# Patient Record
Sex: Female | Born: 1970 | Race: Black or African American | Hispanic: No | Marital: Married | Smoking: Never smoker
Health system: Southern US, Community
[De-identification: ages and names within clinical notes are randomized; demographics above are authoritative.]

## PROBLEM LIST (undated history)

## (undated) DIAGNOSIS — J069 Acute upper respiratory infection, unspecified: Secondary | ICD-10-CM

## (undated) DIAGNOSIS — I10 Essential (primary) hypertension: Secondary | ICD-10-CM

## (undated) DIAGNOSIS — G5600 Carpal tunnel syndrome, unspecified upper limb: Secondary | ICD-10-CM

## (undated) DIAGNOSIS — I719 Aortic aneurysm of unspecified site, without rupture: Secondary | ICD-10-CM

## (undated) DIAGNOSIS — F329 Major depressive disorder, single episode, unspecified: Secondary | ICD-10-CM

## (undated) DIAGNOSIS — J45909 Unspecified asthma, uncomplicated: Secondary | ICD-10-CM

## (undated) DIAGNOSIS — F32A Depression, unspecified: Secondary | ICD-10-CM

## (undated) DIAGNOSIS — K317 Polyp of stomach and duodenum: Secondary | ICD-10-CM

## (undated) DIAGNOSIS — K219 Gastro-esophageal reflux disease without esophagitis: Secondary | ICD-10-CM

## (undated) DIAGNOSIS — F419 Anxiety disorder, unspecified: Secondary | ICD-10-CM

## (undated) DIAGNOSIS — T7840XA Allergy, unspecified, initial encounter: Secondary | ICD-10-CM

## (undated) DIAGNOSIS — K449 Diaphragmatic hernia without obstruction or gangrene: Secondary | ICD-10-CM

## (undated) DIAGNOSIS — I517 Cardiomegaly: Secondary | ICD-10-CM

## (undated) DIAGNOSIS — M1990 Unspecified osteoarthritis, unspecified site: Secondary | ICD-10-CM

## (undated) HISTORY — DX: Gastro-esophageal reflux disease without esophagitis: K21.9

## (undated) HISTORY — DX: Diaphragmatic hernia without obstruction or gangrene: K44.9

## (undated) HISTORY — PX: NASAL SINUS SURGERY: SHX719

## (undated) HISTORY — PX: WISDOM TOOTH EXTRACTION: SHX21

## (undated) HISTORY — DX: Allergy, unspecified, initial encounter: T78.40XA

## (undated) HISTORY — PX: CARPAL TUNNEL RELEASE: SHX101

## (undated) HISTORY — DX: Anxiety disorder, unspecified: F41.9

## (undated) HISTORY — DX: Aortic aneurysm of unspecified site, without rupture: I71.9

## (undated) HISTORY — DX: Acute upper respiratory infection, unspecified: J06.9

## (undated) HISTORY — DX: Unspecified asthma, uncomplicated: J45.909

## (undated) HISTORY — PX: COLONOSCOPY: SHX5424

## (undated) HISTORY — DX: Polyp of stomach and duodenum: K31.7

## (undated) HISTORY — DX: Essential (primary) hypertension: I10

## (undated) HISTORY — PX: OTHER SURGICAL HISTORY: SHX169

---

## 2000-10-07 ENCOUNTER — Ambulatory Visit (HOSPITAL_COMMUNITY): Admission: RE | Admit: 2000-10-07 | Discharge: 2000-10-07 | Payer: Self-pay | Admitting: Neurosurgery

## 2000-10-07 ENCOUNTER — Encounter: Payer: Self-pay | Admitting: Family Medicine

## 2004-05-21 ENCOUNTER — Inpatient Hospital Stay (HOSPITAL_COMMUNITY): Admission: AD | Admit: 2004-05-21 | Discharge: 2004-05-21 | Payer: Self-pay | Admitting: Gynecology

## 2004-06-29 ENCOUNTER — Ambulatory Visit (HOSPITAL_COMMUNITY): Admission: RE | Admit: 2004-06-29 | Discharge: 2004-06-29 | Payer: Self-pay | Admitting: Gynecology

## 2005-05-18 ENCOUNTER — Ambulatory Visit: Payer: Self-pay | Admitting: Nurse Practitioner

## 2005-08-04 ENCOUNTER — Ambulatory Visit: Payer: Self-pay | Admitting: Nurse Practitioner

## 2006-07-26 ENCOUNTER — Other Ambulatory Visit: Admission: RE | Admit: 2006-07-26 | Discharge: 2006-07-26 | Payer: Self-pay | Admitting: Obstetrics & Gynecology

## 2007-10-19 HISTORY — PX: CARPAL TUNNEL RELEASE: SHX101

## 2009-10-08 ENCOUNTER — Ambulatory Visit: Payer: Self-pay | Admitting: Gynecology

## 2009-10-08 ENCOUNTER — Other Ambulatory Visit: Admission: RE | Admit: 2009-10-08 | Discharge: 2009-10-08 | Payer: Self-pay | Admitting: Gynecology

## 2009-10-14 ENCOUNTER — Ambulatory Visit: Payer: Self-pay | Admitting: Gynecology

## 2010-01-01 ENCOUNTER — Ambulatory Visit: Payer: Self-pay | Admitting: Gynecology

## 2010-03-25 ENCOUNTER — Ambulatory Visit: Payer: Self-pay | Admitting: Gynecology

## 2010-06-10 ENCOUNTER — Ambulatory Visit: Payer: Self-pay | Admitting: Gynecology

## 2010-07-01 ENCOUNTER — Ambulatory Visit: Payer: Self-pay | Admitting: Gynecology

## 2010-09-03 ENCOUNTER — Ambulatory Visit: Payer: Self-pay | Admitting: Gynecology

## 2010-10-13 ENCOUNTER — Ambulatory Visit: Payer: Self-pay | Admitting: Gynecology

## 2010-10-16 ENCOUNTER — Other Ambulatory Visit
Admission: RE | Admit: 2010-10-16 | Discharge: 2010-10-16 | Payer: Self-pay | Source: Home / Self Care | Admitting: Gynecology

## 2010-11-08 ENCOUNTER — Encounter: Payer: Self-pay | Admitting: Obstetrics & Gynecology

## 2010-11-27 ENCOUNTER — Ambulatory Visit: Payer: BC Managed Care – PPO

## 2010-11-27 DIAGNOSIS — Z3049 Encounter for surveillance of other contraceptives: Secondary | ICD-10-CM

## 2011-02-26 ENCOUNTER — Ambulatory Visit (INDEPENDENT_AMBULATORY_CARE_PROVIDER_SITE_OTHER): Payer: BC Managed Care – PPO

## 2011-02-26 DIAGNOSIS — Z3049 Encounter for surveillance of other contraceptives: Secondary | ICD-10-CM

## 2011-05-07 ENCOUNTER — Other Ambulatory Visit: Payer: Self-pay | Admitting: Gynecology

## 2011-05-07 ENCOUNTER — Other Ambulatory Visit (HOSPITAL_COMMUNITY)
Admission: RE | Admit: 2011-05-07 | Discharge: 2011-05-07 | Disposition: A | Discharge status: Home / Self Care | Payer: BC Managed Care – PPO | Source: Ambulatory Visit | Attending: Gynecology | Admitting: Gynecology

## 2011-05-07 ENCOUNTER — Ambulatory Visit (INDEPENDENT_AMBULATORY_CARE_PROVIDER_SITE_OTHER): Payer: BC Managed Care – PPO | Admitting: Gynecology

## 2011-05-07 DIAGNOSIS — R87619 Unspecified abnormal cytological findings in specimens from cervix uteri: Secondary | ICD-10-CM

## 2011-05-19 ENCOUNTER — Telehealth: Payer: Self-pay | Admitting: *Deleted

## 2011-05-19 NOTE — Telephone Encounter (Signed)
PT CALLED TO INFORM OFFICE SHE IS SWITCHING TO [facility].

## 2011-05-21 ENCOUNTER — Ambulatory Visit (INDEPENDENT_AMBULATORY_CARE_PROVIDER_SITE_OTHER): Payer: BC Managed Care – PPO | Admitting: Anesthesiology

## 2011-05-21 DIAGNOSIS — Z309 Encounter for contraceptive management, unspecified: Secondary | ICD-10-CM

## 2011-05-21 DIAGNOSIS — IMO0001 Reserved for inherently not codable concepts without codable children: Secondary | ICD-10-CM

## 2011-05-21 MED ORDER — MEDROXYPROGESTERONE ACETATE 150 MG/ML IM SUSP
150.0000 mg | Freq: Once | INTRAMUSCULAR | Status: AC
  Administered 2011-05-21: 150 mg via INTRAMUSCULAR

## 2011-08-20 ENCOUNTER — Ambulatory Visit (INDEPENDENT_AMBULATORY_CARE_PROVIDER_SITE_OTHER): Payer: BC Managed Care – PPO | Admitting: Anesthesiology

## 2011-08-20 DIAGNOSIS — Z309 Encounter for contraceptive management, unspecified: Secondary | ICD-10-CM

## 2011-08-20 MED ORDER — MEDROXYPROGESTERONE ACETATE 150 MG/ML IM SUSP
150.0000 mg | Freq: Once | INTRAMUSCULAR | Status: AC
  Administered 2011-08-20: 150 mg via INTRAMUSCULAR

## 2011-10-26 ENCOUNTER — Other Ambulatory Visit: Payer: Self-pay | Admitting: Gynecology

## 2011-10-27 ENCOUNTER — Ambulatory Visit (INDEPENDENT_AMBULATORY_CARE_PROVIDER_SITE_OTHER): Payer: BC Managed Care – PPO | Admitting: Gynecology

## 2011-10-27 ENCOUNTER — Other Ambulatory Visit (HOSPITAL_COMMUNITY)
Admission: RE | Admit: 2011-10-27 | Discharge: 2011-10-27 | Disposition: A | Discharge status: Home / Self Care | Payer: BC Managed Care – PPO | Source: Ambulatory Visit | Attending: Gynecology | Admitting: Gynecology

## 2011-10-27 ENCOUNTER — Encounter: Payer: Self-pay | Admitting: Gynecology

## 2011-10-27 VITALS — BP 126/80 | Ht 62.5 in | Wt 175.0 lb

## 2011-10-27 DIAGNOSIS — Z01419 Encounter for gynecological examination (general) (routine) without abnormal findings: Secondary | ICD-10-CM | POA: Insufficient documentation

## 2011-10-27 DIAGNOSIS — I10 Essential (primary) hypertension: Secondary | ICD-10-CM

## 2011-10-27 DIAGNOSIS — Z800 Family history of malignant neoplasm of digestive organs: Secondary | ICD-10-CM

## 2011-10-27 DIAGNOSIS — R635 Abnormal weight gain: Secondary | ICD-10-CM

## 2011-10-27 LAB — URINALYSIS, ROUTINE W REFLEX MICROSCOPIC
Bilirubin Urine: NEGATIVE
Glucose, UA: NEGATIVE mg/dL
Ketones, ur: NEGATIVE mg/dL
Protein, ur: NEGATIVE mg/dL
Urobilinogen, UA: 0.2 mg/dL (ref 0.0–1.0)

## 2011-10-27 NOTE — Progress Notes (Signed)
Megan Lang [birth year] [id number]   History:    41 y.o.  for annual exam who states that she has been followed by her primary care physician at the [facility] urgent care facility by Dr. Yong Lang for her hypertension. She has not had any blood work in over year. She was seen several years ago by cardiologist at [facility] and cardiology for nonspecific chest discomfort with negative findings patient had the symptoms maybe once or twice a year very sporadic and very short lived. She was otherwise doing well today. With no complaints. She does her self breast examinations on regular basis. She is using Depo-Provera for contraception every 3 months. Her flu shot she believes was given at the urgent care facility recently. Review of her record again she was weighing 171 up to 175 now with a BMI of 31.  Past medical history,surgical history, family history and social history were all reviewed and documented in the EPIC chart.  Gynecologic History No LMP recorded. Patient has had an injection. Contraception: Depo-Provera injections Last Pap: 2012. Results were: normal Last mammogram: No prior study. Res no prior study/abn:[id number]}  Obstetric History OB History    Grav Para Term Preterm Abortions TAB SAB Ect Mult Living   3 3 3       3      # Outc Date GA Lbr Len/2nd Wgt Sex Del Anes PTL Lv   1 TRM     F SVD  No Yes   2 TRM     F SVD  No Yes   3 TRM     F SVD  No Yes       ROS:  Was performed and pertinent positives and negatives are included in the history.  Exam: chaperone present  BP 126/80  Ht 5' 2.5" (1.588 m)  Wt 175 lb (79.379 kg)  BMI 31.50 kg/m2  Body mass index is 31.50 kg/(m^2).  General appearance : Well developed well nourished female. No acute distress HEENT: Neck supple, trachea midline, no carotid bruits, no thyroidmegaly Lungs: Clear to auscultation, no rhonchi or wheezes, or rib retractions  Heart: Regular rate and rhythm, no murmurs or gallops Breast:Examined in sitting  and supine position were symmetrical in appearance, no palpable masses or tenderness,  no skin retraction, no nipple inversion, no nipple discharge, no skin discoloration, no axillary or supraclavicular lymphadenopathy Abdomen: no palpable masses or tenderness, no rebound or guarding Extremities: no edema or skin discoloration or tenderness  Pelvic:  Bartholin, Urethra, Skene Glands: Within normal limits             Vagina: No gross lesions or discharge  Cervix: No gross lesions or discharge  Uterus  anteverted, normal size, shape and consistency, non-tender and mobile  Adnexa  Without masses or tenderness  Anus and perineum  normal   Rectovaginal  normal sphincter tone without palpated masses or tenderness             Hemoccult not done     Assessment/Plan:  41 y.o. female for annual exam with no abnormal findings. Patient has informed me that her father was diagnosed with colon cancer at the age of 50. Patient will be referred to the gastroenterologist for early screening colonoscopy. She will return back in the first week in February which is due for her next Depo-Provera injection. She is instructed to do her self breast examinations. If she has any recurrence of her chest pain ( which are very sporadic in between) she should return back  to the cardiologist for further evaluation. Because of her weight gain a TSH, comprehensive metabolic panel (history of hypertension) CBC urinalysis and Pap smear. A requisition will be provided her to schedule her mammogram and encouraged her monthly self breast examination. Literature formation weight reduction exercise and diet was provided as well for vaccine up-to-date.    Megan Edwards MD, 9:47 AM 10/27/2011

## 2011-10-27 NOTE — Patient Instructions (Signed)
Exercise to Lose Weight Exercise and a healthy diet may help you lose weight. Your doctor may suggest specific exercises. EXERCISE IDEAS AND TIPS  Choose low-cost things you enjoy doing, such as walking, bicycling, or exercising to workout videos.   Take stairs instead of the elevator.   Walk during your lunch break.   Park your car further away from work or school.   Go to a gym or an exercise class.   Start with 5 to 10 minutes of exercise each day. Build up to 30 minutes of exercise 4 to 6 days a week.   Wear shoes with good support and comfortable clothes.   Stretch before and after working out.   Work out until you breathe harder and your heart beats faster.   Drink extra water when you exercise.   Do not do so much that you hurt yourself, feel dizzy, or get very short of breath.  Exercises that burn about 150 calories:  Running 1  miles in 15 minutes.   Playing volleyball for 45 to 60 minutes.   Washing and waxing a car for 45 to 60 minutes.   Playing touch football for 45 minutes.   Walking 1  miles in 35 minutes.   Pushing a stroller 1  miles in 30 minutes.   Playing basketball for 30 minutes.   Raking leaves for 30 minutes.   Bicycling 5 miles in 30 minutes.   Walking 2 miles in 30 minutes.   Dancing for 30 minutes.   Shoveling snow for 15 minutes.   Swimming laps for 20 minutes.   Walking up stairs for 15 minutes.   Bicycling 4 miles in 15 minutes.   Gardening for 30 to 45 minutes.   Jumping rope for 15 minutes.   Washing windows or floors for 45 to 60 minutes.  Document Released: 11/06/2010 Document Revised: 06/16/2011 Document Reviewed: 11/06/2010 [employer] Patient Information 2012 [employer].                                           Dietary therapy for weight gain   INTRODUCTION - The optimal management of overweight and obesity requires a combination of diet, exercise, and behavioral modification. In addition, some patients  eventually require pharmacologic therapy or bariatric surgery. The risk of overweight to the subject should be evaluated before beginning any treatment program. Selection of treatment can then be made using a risk-benefit assessment). The choice of therapy is dependent on several factors including the degree of overweight or obesity and patient preference.  This topic will review the dietary therapy of obesity. Other aspects of treatment are discussed separately. (See "Health hazards associated with obesity in adults" and "Overview of therapy for obesity in adults" and "Drug therapy of obesity" and "Behavioral strategies in the treatment of obesity".) GOALS OF WEIGHT LOSS - It is important to set goals when discussing a dietary weight loss program with an individual patient. An initial weight loss goal of 5 to 7 percent of body weight is realistic for most individuals. The first goal for any overweight individual is to prevent further weight gain and keep body weight stable (within 5 pounds of its current level).  The goal of the clinician is to identify and review with the patient a realistic weight-loss goal. Most patients have a weight loss goal of 30 percent or more below current weight, which   is unrealistic [1].  A successful program will lead to a weight loss of more than 5 percent of initial weight [2]. A weight loss of more than 5 percent can reduce risk factors for cardiovascular disease, such as dyslipidemia, hypertension, and diabetes mellitus [3]. In the Diabetes Prevention Program, a multi-center trial in patients with impaired glucose tolerance, weight loss of 7 percent reduced the rate of progression from impaired glucose tolerance to diabetes by 58 percent [4]. (See "Prediction and prevention of type 2 diabetes mellitus", section on 'Diabetes Prevention Program'.)  Loss of 5 percent of initial body weight and maintenance of this loss is a good medical result, even if the subject does not reach  his or her "dream" weight.  Although an extremely difficult goal to achieve, a body mass index (BMI) between 20 and 25 kg/m2 puts the subject in the lowest risk category (table 1 and figure 1). DIETARY ENERGY Rate of weight loss - The rate of weight loss is directly related to the difference between the subject's energy intake and energy requirements. Reducing caloric intake below expenditure results in a predictable initial rate of weight loss that is related to the energy deficit [5,6]. However, prediction of weight loss for an individual subject can be difficult because of marked intersubject variability in initial body composition, adherence, and energy expenditure [5,7]. Food records are often inaccurate. Most normal-weight people under-report what they eat by 10 to 30 percent, while overweight people under-report by 30 percent or more [8]. In addition, energy requirements are influenced by fidgeting, gender, age, and genetic factors [5,6,9]. As examples: Men lose more weight than women of similar height and weight when they comply with eating any given diet because men have more lean body mass, less percent body fat, and therefore higher energy expenditure.  Older subjects of either sex have a lower energy expenditure and therefore lose weight more slowly than younger subjects; metabolic rate declines by approximately 2 percent per decade (about 100 kcal/decade) [10].  The importance of genetic factors is illustrated by a study of identical female twin pairs who were overfed to induce weight gain [11]. Twelve twin pairs were overfed by 1000 kcal/day for 84 of 100 days. The degree of weight gain at a constant dietary caloric increment varied widely among the twin pairs (from 4.3 to 13.3 kg), in fact, there was three times the variance for both weight and fat mass among the twin pairs when compared with that within the twin pairs. Approximately 22 kcal/kg is required to maintain a kilogram of body weight in  a normal adult. Thus, the expected or calculated energy expenditure for a woman weighing 100 kg is approximately 2200 kcal/day. The variability of 20 percent could give energy needs as high as 2620 kcal/day or as low as 1860 kcal/day. An average deficit of 500 kcal/day should result in an initial weight loss of approximately 0.5 kg/week (1 lb/week). However, after three to six months of weight loss, energy expenditure adaptations occur, which slow the bodyweight response to a given change in energy intake, thereby diminishing ongoing weight loss [7]. There are several methods of formally estimating energy expenditure; we suggest using the WHO criteria (table 2). This method allows a direct estimate of resting metabolic rate (RMR) and calculation of daily energy requirement. The low activity level (1.3 x RMR) includes subjects who lead a sedentary life. The high activity level (1.7 x RMR) applies to those in jobs requiring manual labor or patients with regular daily physical exercise   programs [12]. Maintenance of weight loss - It is important for the overweight subject to understand that achieving and maintaining weight loss is made difficult by the reduction in energy expenditure that is induced by weight loss (figure 2) [13]. Weight loss maintenance is also difficult because of changes in the peripheral hormone signals that regulate appetite. Gastrointestinal peptides, such as ghrelin, which stimulates appetite, and gastric inhibitory polypeptide, which may promote energy storage, increase after diet-induced weight loss. Other circulating mediators that inhibit intake (eg, leptin, peptide YY, cholecystokinin, pancreatic polypeptide) decrease. These hormonal adaptations favoring weight gain persist for at least one year after diet-induced weight loss [14]. (See "Overview of therapy for obesity in adults", section on 'Maintenance of weight loss' and "Pathogenesis of obesity", section on 'Ghrelin'.)  TYPES OF  DIETS - The general consensus is that excess intake of calories from any source, associated with a sedentary lifestyle, causes weight gain and obesity. The goal of dietary therapy, therefore, is to decrease energy intake from food. Conventional diets are defined as those below energy requirements but above 800 kcal/day [15]. These diets fall into four groups: Balanced low-calorie diets/portion-controlled diets  Low-fat diets  Low-carbohydrate diets  Mediterranean diet  Fad diets (diets involving unusual combinations of foods or eating sequences) Commercial weight loss programs and internet-based programs are discussed elsewhere. (See "Behavioral strategies in the treatment of obesity".) Balanced low-calorie diets - Planning a diet requires the selection of a caloric intake and then selection of foods to meet this intake. It is desirable to eat foods with adequate nutrients in addition to protein, carbohydrate, and essential fatty acids. Thus, weight-reducing diets should eliminate alcohol, sugar-containing beverages, and most highly concentrated sweets because they rarely contain adequate amounts of other nutrients besides energy. Breakdown of some protein is to be expected during weight loss. When weight increases as a result of overeating, approximately 75 percent of the extra energy is stored as fat and the remaining 25 percent as lean tissue. If the lean tissue contains 20 percent protein, then 5 percent of the extra weight gain would be protein. Thus, it should be anticipated that during weight loss, at least 5 percent of weight loss will be protein. A desirable feature of any calorie restricted diet, however, is that it results in the lowest possible loss of protein, recognizing that this will not be less than 5 percent of the weight that is lost. Portion-controlled diets - One simple approach to providing a calorie-controlled diet is to use individually packaged foods, such as formula diet drinks  using powdered or liquid formula diets, nutrition bars, frozen food, and pre-packaged meals that can be stored at room temperature as the main source of nutrients. Frozen low-calorie meals containing 250 to 350 kcal/package can be a convenient and nutritious way to do this. We have often recommended the use of formula diets or breakfast bars for breakfast, formula diets or a frozen lunch entree for lunch, and a frozen calorie-controlled entree with additional vegetables for dinner. In this way, it is possible to obtain a calorie-controlled 1000 to 1500 kcal per day diet. In one four-year study this approach resulted in early initial weight loss, which then was maintained [16]. I do not recommend the use of formula diets alone because they do not provide adequate nutritional variety. Low-fat diets - Low-fat diets are another standard strategy to help patients lose weight, and almost all dietary guidelines recommend a reduction in the daily intake of fat to 30 percent of energy intake or less [  17,18]. In a meta-analysis of trials comparing low-fat diets (typically 20 to 25 percent of energy from fats) with a control group consuming a usual diet or a medium fat diet (usually 35 to 40 percent of energy), there was greater weight loss (approximately 3 kg) with low-fat compared with moderate fat diets [19]. In addition, one report noted that people who successfully keep their weight reduced adopt three strategies, one of which is eating a lower fat diet [20]. (See "Dietary fat" and "Etiology and natural history of obesity", section on 'Dietary habits'.) A low-fat dietary pattern with healthy carbohydrates is not associated with weight gain. This was illustrated by the [facility] Dietary Modification Trial of [phone] postmenopausal women over age 50 years who were randomly assigned to a dietary intervention that included group and individual sessions to promote a decrease in fat intake and increases in  fruit, vegetable, and grain consumption (healthy carbohydrates), but did not include weight loss or caloric restriction goals, or a control group which received only dietary educational materials [21]. After an average of 7.5 years of follow-up, the following results were seen: Women in the intervention group lost weight in the first year (mean of 2.2 kg) and maintained lower weight than the control women at 7.5 years (difference of 1.9 kg at one year, and 0.4 kg at 7.5 years).  No tendency toward weight gain was seen in the intervention group overall, or when stratified by age, ethnicity, or body mass index.  Weight loss was related to the level of fat intake and was greatest in women who decreased their percentage of energy from fat the most. A similar, but lesser trend was seen with increased vegetable and fruit intake. A low-fat diet can be implemented in two ways. First, the dietitian can provide the subject with specific menu plans that emphasize the use of reduced fat foods. As one guideline, if a food "melts" in your mouth, it probably has fat in it. Second, subjects can be instructed in counting fat grams as an alternative to counting calories. Fat has 9.4 kcal/g. It is thus very easy to calculate the number of grams of fat a subject can eat for any given level of energy intake. Many experts recommend keeping calories from fat to below 30 percent of total calories. In practical terms, this means eating about 33 g of fat for each 1000 calories in the diet. For simplicity, I use 30 g of fat or less for each 1000 kcal. For a 1500-calorie diet, this would mean about 45 g or less of fat, which can be counted using the nutrition information labels on food packages. Low-carbohydrate diets - Proponents of low-carbohydrate diets have argued that the increasing obesity epidemic may be in part due to low-fat, high-carbohydrate diets. But this may be dependent upon the type of carbohydrates that are eaten, such  as energy dense snacks and sugar or high fructose containing beverages. The carbohydrate content of the diet is an important determinant of short-term (less than two weeks) weight loss. Low (60 to 130 grams of carbohydrates) and very low-carbohydrate diets (0 to <60 grams) have been popular for many years [15]. Restriction of carbohydrates leads to glycogen mobilization and, if carbohydrate intake is less than 50 g/day, ketosis will develop. Rapid weight loss occurs, primarily due to glycogen breakdown and fluid loss rather than fat loss. Low and very low-carbohydrate diets are more effective for short-term weight loss than low-fat diets, although probably not for long-term weight loss. A meta-analysis   of five trials found that the difference in weight loss at six months, favoring the low carbohydrate over low fat diet, was not sustained at 12 months [22]. (See 'Comparison trials' below.) Low-carbohydrate diets may have some other beneficial effects with regard to risk of developing type 2 diabetes mellitus, coronary heart disease, and some cancers, particularly if attention is paid to the type as well as the quantity of carbohydrate. A low-carbohydrate diet can be implemented in two ways, either by reducing the total amount of carbohydrate or by consuming foods with a lower glycemic index or glycemic load (table 3). Glycemic index and load are reviewed separately. (See "Dietary carbohydrates", section on 'Glycemic index'.) If a low-carbohydrate diet is chosen, healthy choices for fat (mono- and polyunsaturated fats) and protein (fish, nuts, legumes, and poultry) should be encouraged because of the association between saturated fat intake and risk of coronary heart disease. During 26 years of follow-up of women in the Nurses' Health Study and 20 years of follow-up of men in the Health Professionals' Follow-up Study, low carbohydrate diets in the highest versus lowest decile for vegetable proteins and fat were  associated with lower all-cause mortality (HR 0.80, 95% CI 0.75-0.85) and cardiovascular mortality (HR 0.77, 95% CI 0.68-0.87) [23]. In contrast, low carbohydrate diets in the highest versus lowest decile for animal protein and fat were associated with higher all-cause (HR 1.23, 95% CI 1.11-1.37) and cardiovascular (HR 1.14, 95% CI 1.01-1.29) mortality. (See "Dietary fat" and "Overview of primary prevention of coronary heart disease and stroke", section on 'Healthy diet'.) High protein diets - Some popular books recommend high protein diets [24]. In one trial, low-fat diets with 12 percent and 25 percent protein content were compared. Weight loss over six months was greater with the higher protein diet (9 versus 5 kg), but the difference was no longer significant at 12 and 24 months [25]. Higher protein diets may improve weight maintenance, as illustrated by the results of a study of 60 subjects randomly assigned to a low fat, high protein versus low-fat, high-carbohydrate diet after completing a four week very low calorie diet [26]. Among the subjects who completed the three-month study (n = 48), the high protein diet group had significantly better weight maintenance (between group difference of 2.3 kg). High dietary protein intake, due to its acid-producing load, increases urinary calcium excretion (with potential risk for bone loss and calcium stone formation) [27]. Urinary calcium excretion does appear to increase when dietary intake of protein increases [27-29], and this could pose a long-term risk for nephrolithiasis. (See "Risk factors for calcium stones in adults", section on 'Dietary risk factors'.) However, two small randomized trials that looked at bone metabolism found evidence that increased dietary protein may decrease bone resorption [28,29]. One of the trials found that increased intestinal absorption of calcium was primarily responsible for the increased urinary excretion of calcium and that the  excreted calcium was not coming from bone [29]. Mediterranean diet - The term Mediterranean diet refers to a dietary pattern that is common in [place] areas of the [place] area. Although there is some variation in Mediterranean diets, there are some common components that include a high level of monounsaturated fat relative to saturated; moderate consumption of alcohol, mainly as wine; a high consumption of vegetables, fruits, legumes, and grains; a moderate consumption of milk and dairy products, mostly in the form of cheese; and a relatively low intake of meat and meat products. A meta-analysis of 12 studies involving eight cohorts found that a   Mediterranean diet was associated with improved health status and reductions in overall mortality, cardiovascular mortality, cancer mortality, and incidence of Parkinson's disease and Alzheimer's disease [30]. (See "Healthy diet in adults", section on 'Mediterranean diet'.) Very low-calorie diets - Diets with energy levels between 200 and 800 kcal/day are called "very low-calorie diets," while those below 200 kcal/day can be termed starvation diets. The basis for these diets was the notion that the lower the calorie intake the more rapid the weight loss, because the energy withdrawn from body fat stores is a function of the energy deficit. Starvation is the ultimate very low-calorie diet and results in the most rapid weight loss. Although once popular, starvation diets are now rarely used for treatment of obesity. Very low-calorie diets have not been shown to be superior to conventional diets for long-term weight loss. In a meta-analysis of six trials comparing very low-calorie diets with conventional low-calorie diets, short-term weight loss was greater with very low-calorie diets (16.1 versus 9.7 versus percent of initial weight), but there was no difference in long-term weight loss (6.3 versus 5.0 percent) [31]. As with all diets, very low-calorie diets  initially result in substantial protein loss that diminishes with time. Other expected effects include reduction in blood pressure and improvement in hyperglycemia in diabetic patients. Subjects adhering to very low-calorie diets usually have a fall in blood pressure, especially during the first week. Antihypertensive drugs, especially calcium channel blockers and diuretics, should usually be discontinued when a very low calorie diet is begun unless moderate to severe hypertension is present.  Most diabetic patients eating very low-calorie diets have marked improvement in hyperglycemia. Blood glucose concentrations fall within the first one to two weeks, and remain lower as long as the diet is continued. Those patients taking less than 50 units of insulin or an oral hypoglycemic drug will usually be able to discontinue therapy [32]. The side effects of very low-calorie diets include hair loss, thinning of the skin, and coldness. These diets are contraindicated for lactating and pregnant women, and in children who require protein for linear growth. As with all diets, there is increased cholesterol mobilization from peripheral fat stores, thus increasing the risk of gallstones. Very low-calorie diets should be reserved for subjects who require rapid weight loss for a specific purpose, such as surgery. The weight regain when the diet is stopped is often rapid, and it is better to take a more sustainable approach than to use a method that cannot be sustained. Comparison trials - The impact of specific dietary composition on weight change remains uncertain. When energy from dietary carbohydrates decreases, energy from fat sources tends to increase. The reverse is also true; when energy from dietary fats decreases, energy from carbohydrate sources tends to increase. The debate has mainly centered on whether low-fat or low-carbohydrate diets can better induce weight loss and sustain it over the long-term. Weight loss  diets - Initial trials evaluating the effect of type of diet (predominantly low-carbohydrate versus low-fat) on weight loss and other outcomes showed that weight loss at six months was approximately 4 kg greater in the very low-carbohydrate group than in the low-fat group [33-35]. Trials lasting for one year, however, did not find a significant difference in weight loss [34,36,37]. A meta-analysis of five trials (including one study not referenced above) found that the difference in weight loss at six months, favoring the low carbohydrate over low fat diet, was not sustained at 12 months [22]. In one study, this convergence was mainly due to   regain of weight in the low-carbohydrate group [34]; in another, the convergence was due to ongoing weight loss in the low-fat group (figure 3) [36]. Some of these initial comparison trials of different dietary regimens had important limitations [22]. These included high dropout rates (21 to 48 percent), suboptimal dietary compliance, and limited long-term follow-up. Subsequent trials are larger, of longer duration (lasting one to two years), and have conflicting results with regard to the impact of macronutrient composition on weight loss [38-41]. In contrast, all trials found that dietary adherence is an important determinant of weight loss, independent of macronutrient composition. The following observations illustrate the range of findings in these trials: In one trial, 322 moderately obese subjects (86 percent men) were randomly assigned to a low-fat (restricted calorie), Mediterranean (moderate-fat, restricted calorie, rich in vegetables, low in red meat), or low-carbohydrate (non-restricted-calorie) diet for two years [38]. Adherence rates were higher than those reported in previous trials (95.4 and 84.6 percent at one and two years, respectively). Weight loss was greater with the Mediterranean and low-carbohydrate diets than the low-fat diet (mean weight loss 4.4,  4.7, and 2.9 kg, respectively).  The most favorable effect on lipids (increased HDL and decreased triglycerides and ratio of total cholesterol to HDL) was seen in the low-carbohydrate group. Among subjects with type 2 diabetes, the greatest improvement in glycemic control occurred with the Mediterranean diet. Among all groups, weight loss was greater for those who completed the two year study than for those who withdrew.  Another randomized trial compared four different diets in 311 overweight and obese premenopausal women: very low-carbohydrate (Atkins); macronutrient balance controlling glycemic load ([facility]); general calorie restriction, low-fat ([facility]); and very low-fat ([place]) [39]. In the intention-to-treat analysis at one year, mean weight loss was greater in the Atkins diet group compared with the other groups (4.7, 1.6, 2.2, and 2.6 kg, respectively). Pairwise comparisons showed a significant difference only for Atkins versus Zone.  The most favorable effect on triglycerides and HDL-C was seen in the Atkins group. Dietary adherence rates (77 to 88 percent) were similar among the groups and better than in previous trials. Within each group, adherence was significantly associated with weight loss [42].  In the largest trial to date, 811 overweight and obese adults were randomly assigned to one of four diets based upon macronutrient content: low or high fat (20 to 40 percent), which provided carbohydrate at 35, 45, 55, or 65 percent, and high or average protein (15 to 25 percent) [40]. After six months, mean weight loss in each group was 6 kg. By two years, mean weight loss was 3 to 4 kg, and weight losses remained similar in all groups. Many participants had trouble attaining target levels of macronutrients. Subjects who attended the greatest number of group sessions (most adherent) lost the most weight. Thus, any diet that is adhered to will produce modest weight loss, but adherence rates are low with  most diets. Although a low-carbohydrate diet may be associated with greater short-term weight loss, superior weight loss in the long-term has not been established. The optimal mix of macronutrients likely depends upon individual factors [43]. A principal determinant of weight loss appears to be the degree of adherence to the diet, irrespective of the particular macronutrient composition [37,39,40,42,44,45]. Thus, we suggest choosing a macronutrient mix based upon patient preferences, which may improve long-term adherence. Behavioral modification to improve dietary compliance with any type of diet may have the greatest impact on long-term weight loss. (See "Behavioral strategies in the treatment   of obesity".) Lipids - The observed effects on blood lipids were similar for trials comparing low fat and very low carbohydrate diets [22,33-36,46]; the low-carbohydrate/high-fat diets caused slight increases in HDL, and greater decreases in fasting triglycerides. At 12 to 24 months, however, the favorable effects on HDL persisted [34,36,37,41], while triglyceride levels were either reduced [34,36] or returned to baseline [37]. In a meta-analysis of trials comparing low-carbohydrate and low-fat diet groups, LDL levels were increased in the low-carbohydrate group [22]. There was no clear benefit of either low-fat or low-carbohydrate diet on cardiovascular risks. Favorable changes in HDL cholesterol and triglycerides should be weighed against potential unfavorable changes in LDL cholesterol.  Side effects - Very low-carbohydrate diets may be associated with more frequent side effects than low-fat diets. In one of the trials noted above, a number of symptoms occurred significantly more frequently in the low-carbohydrate compared to the low-fat diet group [33]. These included constipation (68 versus 35 percent), headache (60 versus 40 percent), halitosis (38 versus 8 percent), muscle cramps (35 versus 7 percent), diarrhea (23  versus 7 percent), general weakness (25 versus 8 percent), and rash (13 versus 0 percent) [33]. Despite the higher rate of symptoms, dropout rates in clinical trials have been similar for low-carbohydrate and low-fat diets [34-36]. Some have raised the concern about ketosis that occurs with very low-carbohydrate diets. There is one case report of an obese patient who presented in severe ketoacidosis, having lost 9 kg in one month on the Atkins diet, with intake restricted to meat, cheese, and salads [47]. Aside from her diet and possible mild dehydration due to gastroenteritis, no other cause for her ketoacidosis was identified. Weight maintenance diets - Although many individuals have success losing weight with diet, most subsequently regain much or all of the lost weight. Maintaining weight loss is made difficult by the reduction in energy expenditure that is induced by weight loss. In addition, long-term adherence to restrictive diets is difficult. Exercise and behavioral interventions may help individuals maintain weight loss. These strategies are reviewed in detail elsewhere. (See "Role of physical activity and exercise in obesity", section on 'Maintenance of weight loss' and "Behavioral strategies in the treatment of obesity", section on 'Maintenance of weight loss'.) There is little consensus on the optimal mix of macronutrients to maintain weight loss. The satiating effects of high protein, low glycemic index diets have generated interest in manipulating protein composition and glycemic index in weight maintenance diets. (See 'High protein diets' above and "Dietary carbohydrates", section on 'Effect of glycemic index/glycemic load'.) In a multicenter trial of five ad libitum diets to prevent weight regain over 26 weeks, 773 adults who had successfully lost 8 percent of their body weight on a low calorie diet (800 to 1000 kcal/day), were randomly assigned in a two-by-two factorial design to a high or  low-protein (25 versus 13 percent of total calories), high or low-glycemic index, or to a control diet (moderate protein content) [48]. All diets had a moderate fat content (25 to 30 percent). The achieved protein content was 5 percentage points higher in the high versus low protein groups, and the mean glycemic index was five units lower in the low-glycemic versus high-glycemic index groups. In the intention-to-treat analysis, weight regain during the trial was modestly but significantly greater in the low versus high-protein groups (mean difference 0.93 kg) and in the high versus low-glycemic index groups (mean difference 0.95 kg). Only subjects in the high-protein, low-glycemic index diet group continued to lose weight (mean change -0.38 kg).   The trial was limited by the moderate dropout rate (29 percent) and short-term follow-up (six months). Whether a low glycemic index, high protein diet is associated with long-term weight maintenance is unknown. As discussed above, long-term adherence to a weight maintaining diet is probably the most important determinant of success, and therefore the optimal weight maintaining diet will depend upon preference and individual factors. Role of dietary counseling - Dietary counseling may produce modest, short-term weight losses. This topic is reviewed in detail elsewhere. (See "Behavioral strategies in the treatment of obesity", section on 'Elements of behavioral strategies' and "Behavioral strategies in the treatment of obesity", section on 'Efficacy'.)  Prolonged caloric restriction and longevity - Prolonged caloric restriction improves longevity in rodents and non-human primates [49], but it is not known if the same is true in humans. It is hypothesized that the antiaging effects of caloric restriction are due to reduced energy expenditure resulting in a reduction in production of reactive oxygen species (and therefore a reduction in oxidative damage). In addition, other  metabolic effects associated with caloric restriction, such as improved insulin sensitivity, might also have an antiaging effect. In one trial of 48 sedentary, overweight men and women, six months of caloric restriction, with or without exercise, resulted in significant weight loss as expected [50]. In addition, calorie restriction-mediated reductions in fasting insulin concentrations, core body temperature, serum T3 levels, and oxidative damage to DNA (as reflected by a reduction in DNA fragmentation) were seen, suggesting a possible antiaging effect of the prolonged caloric restriction. INFORMATION FOR PATIENTS - UpToDate offers two types of patient education materials, "The Basics" and "Beyond the Basics." The Basics patient education pieces are written in plain language, at the 5th to 6th grade reading level, and they answer the four or five key questions a patient might have about a given condition. These articles are best for patients who want a general overview and who prefer short, easy-to-read materials. Beyond the Basics patient education pieces are longer, more sophisticated, and more detailed. These articles are written at the 10th to 12th grade reading level and are best for patients who want in-depth information and are comfortable with some medical jargon. Here are the patient education articles that are relevant to this topic. We encourage you to print or e-mail these topics to your patients. (You can also locate patient education articles on a variety of subjects by searching on "patient info" and the keyword(s) of interest.)  Basics topics (see "Patient information: Diet and health (The Basics)" and "Patient information: Weight loss treatments (The Basics)")  Beyond the Basics topics (see "Patient information: Diet and health (Beyond the Basics)" and "Patient information: Weight loss treatments (Beyond the Basics)" and "Patient information: Weight loss surgery (Beyond the Basics)")  SUMMARY  AND RECOMMENDATIONS An initial weight loss goal of 5 to 7 percent of body weight is realistic for most individuals. (See 'Goals of weight loss' above.)  Many types of diets produce modest weight loss. Options include balanced low-calorie, low-fat low-calorie, moderate-fat low calorie, low-carbohydrate diets, and the Mediterranean diet. Dietary adherence is an important predictor of weight loss, irrespective of the type of diet. (See 'Types of diets' above.)  We suggest tailoring a diet that reduces energy intake below energy expenditure to individual patient preferences, rather than focusing on the macronutrient composition of the diet (Grade 2B). (See 'Comparison trials' above.)  If a low-carbohydrate diet is chosen, healthy choices for fat (mono and polyunsaturated) and protein (fish, nuts, legumes, and poultry) should be encouraged. If a   low-fat diet is chosen, the decrease in fat should be accompanied by increases in healthy carbohydrates (fruits, vegetables, whole grains).   

## 2011-10-28 LAB — CBC WITH DIFFERENTIAL/PLATELET
Eosinophils Absolute: 0.2 10*3/uL (ref 0.0–0.7)
Eosinophils Relative: 4 % (ref 0–5)
HCT: 41.4 % (ref 36.0–46.0)
Hemoglobin: 13.5 g/dL (ref 12.0–15.0)
Lymphocytes Relative: 34 % (ref 12–46)
Lymphs Abs: 1.7 10*3/uL (ref 0.7–4.0)
MCH: 29.9 pg (ref 26.0–34.0)
MCV: 91.6 fL (ref 78.0–100.0)
Monocytes Relative: 9 % (ref 3–12)
Platelets: 321 10*3/uL (ref 150–400)
RBC: 4.52 MIL/uL (ref 3.87–5.11)
WBC: 4.9 10*3/uL (ref 4.0–10.5)

## 2011-10-28 LAB — COMPREHENSIVE METABOLIC PANEL
ALT: 31 U/L (ref 0–35)
AST: 27 U/L (ref 0–37)
Albumin: 4.5 g/dL (ref 3.5–5.2)
BUN: 8 mg/dL (ref 6–23)
CO2: 23 mEq/L (ref 19–32)
Calcium: 9.8 mg/dL (ref 8.4–10.5)
Chloride: 101 mEq/L (ref 96–112)
Potassium: 3.8 mEq/L (ref 3.5–5.3)

## 2011-10-28 LAB — CHOLESTEROL, TOTAL: Cholesterol: 135 mg/dL (ref 0–200)

## 2011-11-10 ENCOUNTER — Ambulatory Visit (INDEPENDENT_AMBULATORY_CARE_PROVIDER_SITE_OTHER): Payer: BC Managed Care – PPO | Admitting: Anesthesiology

## 2011-11-10 DIAGNOSIS — Z309 Encounter for contraceptive management, unspecified: Secondary | ICD-10-CM

## 2011-11-10 MED ORDER — MEDROXYPROGESTERONE ACETATE 150 MG/ML IM SUSP
150.0000 mg | Freq: Once | INTRAMUSCULAR | Status: AC
  Administered 2011-11-10: 150 mg via INTRAMUSCULAR

## 2011-12-17 ENCOUNTER — Other Ambulatory Visit: Payer: Self-pay | Admitting: Internal Medicine

## 2011-12-18 ENCOUNTER — Other Ambulatory Visit: Payer: Self-pay | Admitting: Physician Assistant

## 2012-01-01 ENCOUNTER — Other Ambulatory Visit: Payer: Self-pay | Admitting: Physician Assistant

## 2012-01-20 ENCOUNTER — Ambulatory Visit: Payer: BC Managed Care – PPO

## 2012-01-21 ENCOUNTER — Ambulatory Visit (INDEPENDENT_AMBULATORY_CARE_PROVIDER_SITE_OTHER): Payer: BC Managed Care – PPO | Admitting: Family Medicine

## 2012-01-21 VITALS — BP 123/83 | HR 78 | Temp 99.1°F | Resp 16 | Ht 63.0 in | Wt 173.0 lb

## 2012-01-21 DIAGNOSIS — R079 Chest pain, unspecified: Secondary | ICD-10-CM

## 2012-01-21 DIAGNOSIS — I10 Essential (primary) hypertension: Secondary | ICD-10-CM

## 2012-01-21 MED ORDER — TRIAMTERENE-HCTZ 37.5-25 MG PO TABS: 1.0000 | ORAL_TABLET | Freq: Every day | ORAL | Status: DC

## 2012-01-21 MED ORDER — AMLODIPINE BESYLATE 10 MG PO TABS: 10.0000 mg | ORAL_TABLET | Freq: Every day | ORAL | Status: DC

## 2012-01-21 NOTE — Progress Notes (Signed)
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  Stressed at work.  Has three children, youngest is 67.  Lives with fiance.  She does have some lightheaded spells.  Notes some sharp intermittent chest pains, unrelated to activity.  Had an episode of the chest discomfort last night lasting about 30 seconds.  O:  Alert, cooperative, NAD Neck:  Supple, no thyromegaly or adenopathy Chest:  Clear Heart:  Reg, no murmur, no gallop Ext:  No edema Results for orders placed in visit on 10/27/11  URINALYSIS, ROUTINE W REFLEX MICROSCOPIC      Component Value Range   Color, Urine YELLOW  YELLOW    APPearance CLEAR  CLEAR    Specific Gravity, Urine 1.020  1.005 - 1.030    pH 7.5  5.0 - 8.0    Glucose, UA NEG  NEG (mg/dL)   Bilirubin Urine NEG  NEG    Ketones, ur NEG  NEG (mg/dL)   Hgb urine dipstick NEG  NEG    Protein, ur NEG  NEG (mg/dL)   Urobilinogen, UA 0.2  0.0 - 1.0 (mg/dL)   Nitrite NEG  NEG    Leukocytes, UA NEG  NEG   CBC WITH DIFFERENTIAL      Component Value Range   WBC 4.9  4.0 - 10.5 (K/uL)   RBC 4.52  3.87 - 5.11 (MIL/uL)   Hemoglobin 13.5  12.0 - 15.0 (g/dL)   HCT 40.9  81.1 - 91.4 (%)   MCV 91.6  78.0 - 100.0 (fL)   MCH 29.9  26.0 - 34.0 (pg)   MCHC 32.6  30.0 - 36.0 (g/dL)   RDW 78.2  95.6 - 21.3 (%)   Platelets 321  150 - 400 (K/uL)   Neutrophils Relative 53  43 - 77 (%)   Neutro Abs 2.6  1.7 - 7.7 (K/uL)   Lymphocytes Relative 34  12 - 46 (%)   Lymphs Abs 1.7  0.7 - 4.0 (K/uL)   Monocytes Relative 9  3 - 12 (%)   Monocytes Absolute 0.5  0.1 - 1.0 (K/uL)   Eosinophils Relative 4  0 - 5 (%)   Eosinophils Absolute 0.2  0.0 - 0.7 (K/uL)   Basophils Relative 0  0 - 1 (%)   Basophils Absolute 0.0  0.0 - 0.1 (K/uL)   Smear Review Criteria for review not met    TSH      Component Value Range   TSH 1.090  0.350 - 4.500 (uIU/mL)  COMPREHENSIVE METABOLIC PANEL      Component Value Range   Sodium 136  135 - 145 (mEq/L)   Potassium 3.8  3.5 - 5.3 (mEq/L)   Chloride 101  96 - 112 (mEq/L)   CO2 23  19 - 32 (mEq/L)   Glucose, Bld 68 (*) 70 - 99 (mg/dL)   BUN 8  6 - 23 (mg/dL)   Creat 0.86  5.78 - 4.69 (mg/dL)   Total Bilirubin 0.6  0.3 - 1.2 (mg/dL)   Alkaline Phosphatase 85  39 - 117 (U/L)   AST 27  0 - 37 (U/L)   ALT 31  0 - 35 (U/L)   Total Protein 7.6  6.0 - 8.3 (g/dL)   Albumin 4.5  3.5 - 5.2 (g/dL)   Calcium 9.8  8.4 - 62.9 (mg/dL)  CHOLESTEROL, TOTAL      Component Value Range   Cholesterol 135  0 - 200 (mg/dL)   EKG:  nsr  A:  Atypical chest pains, hypertension controlled on high risk med (hydralazine)  P: The hydralazine and replace it with amlodipine  Followup one month

## 2012-01-21 NOTE — Patient Instructions (Signed)

## 2012-02-04 ENCOUNTER — Ambulatory Visit (INDEPENDENT_AMBULATORY_CARE_PROVIDER_SITE_OTHER): Payer: BC Managed Care – PPO | Admitting: *Deleted

## 2012-02-04 DIAGNOSIS — Z3049 Encounter for surveillance of other contraceptives: Secondary | ICD-10-CM

## 2012-02-04 MED ORDER — MEDROXYPROGESTERONE ACETATE 150 MG/ML IM SUSP
150.0000 mg | Freq: Once | INTRAMUSCULAR | Status: AC
  Administered 2012-02-04: 150 mg via INTRAMUSCULAR

## 2012-04-26 ENCOUNTER — Other Ambulatory Visit: Payer: Self-pay | Admitting: Gynecology

## 2012-04-28 ENCOUNTER — Ambulatory Visit (INDEPENDENT_AMBULATORY_CARE_PROVIDER_SITE_OTHER): Payer: BC Managed Care – PPO | Admitting: *Deleted

## 2012-04-28 DIAGNOSIS — Z3049 Encounter for surveillance of other contraceptives: Secondary | ICD-10-CM

## 2012-04-28 MED ORDER — MEDROXYPROGESTERONE ACETATE 150 MG/ML IM SUSP
150.0000 mg | Freq: Once | INTRAMUSCULAR | Status: AC
  Administered 2012-04-28: 150 mg via INTRAMUSCULAR

## 2012-05-25 ENCOUNTER — Ambulatory Visit: Payer: BC Managed Care – PPO

## 2012-05-25 ENCOUNTER — Telehealth: Payer: Self-pay

## 2012-05-25 ENCOUNTER — Ambulatory Visit (INDEPENDENT_AMBULATORY_CARE_PROVIDER_SITE_OTHER): Payer: BC Managed Care – PPO | Admitting: Family Medicine

## 2012-05-25 VITALS — BP 126/80 | HR 94 | Temp 98.8°F | Resp 16 | Ht 64.0 in | Wt 180.0 lb

## 2012-05-25 DIAGNOSIS — M549 Dorsalgia, unspecified: Secondary | ICD-10-CM

## 2012-05-25 DIAGNOSIS — I517 Cardiomegaly: Secondary | ICD-10-CM

## 2012-05-25 DIAGNOSIS — M546 Pain in thoracic spine: Secondary | ICD-10-CM

## 2012-05-25 MED ORDER — TRAMADOL HCL 50 MG PO TABS: 50.0000 mg | ORAL_TABLET | Freq: Three times a day (TID) | ORAL | Status: DC | PRN

## 2012-05-25 MED ORDER — CYCLOBENZAPRINE HCL 10 MG PO TABS: 10.0000 mg | ORAL_TABLET | Freq: Three times a day (TID) | ORAL | Status: DC | PRN

## 2012-05-25 NOTE — Patient Instructions (Addendum)
Continue to use heat as needed.  Be aware that your medications could make you sleepy.  Let me know if you are not better in a couple of days- sooner if you get worse or the shortness of breath comes back

## 2012-05-25 NOTE — Telephone Encounter (Signed)
Patient saw Copland this morning for back pain and has a question about the medication that was prescribed.  Call [phone]

## 2012-05-25 NOTE — Progress Notes (Signed)
[facility] [address] [place] [address] [phone]  Date:  05/25/2012   Name:  Megan Lang   DOB:  12-Dec-1970   MRN:  [id number]  PCP:  [name], MD    Chief Complaint: Back Pain   History of Present Illness:  Megan Lang is a 41 y.o. very pleasant female patient who presents with the following:  She has noted an "achy, dull" pain in her upper back for a couple of days. Aleve did help.  When she got home from work yesterday she had more pain, and had a hard time sleeping.  She hurts in her middle of her upper back.   She did feel SOB last night- she had to sit up in order to breathe comfortably.  She has not noted any CP.  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  She has not noted any CP while working, no history of CAD or DVT/ PE, no tobacco.   She is on Depo- provera- no risk of pregnancy as far as she knows.     Patient Active Problem List  Diagnosis  . Family history of colon cancer  . HTN (hypertension)    Past Medical History  Diagnosis Date  . Normal spontaneous vaginal delivery     3  . Hypertension     Past Surgical History  Procedure Date  . Carpal tunnel release 2009    History  Substance Use Topics  . Smoking status: Never Smoker   . Smokeless tobacco: Never Used  . Alcohol Use: Yes     OCC- WINE    Family History  Problem Relation Age of Onset  . Hypertension Mother   . Cancer Father     COLON  . Breast cancer Maternal Grandmother     No Known Allergies  Medication list has been reviewed and updated.  Current Outpatient Prescriptions on File Prior to Visit  Medication Sig Dispense Refill  . amLODipine (NORVASC) 10 MG tablet Take 1 tablet (10 mg total) by mouth daily.  90 tablet  3  . Cetirizine HCl (ZYRTEC ALLERGY PO) Take by mouth.        .  medroxyPROGESTERone (DEPO-PROVERA) 150 MG/ML injection INJECT INTRAMUSCULARLY AS DIRECTED  1 mL  2  . guaiFENesin (ROBITUSSIN) 100 MG/5ML liquid Take 200 mg by mouth 3 (three) times daily as needed.      . naproxen sodium (ANAPROX) 220 MG tablet Take 220 mg by mouth 2 (two) times daily with a meal.      . triamterene-hydrochlorothiazide (MAXZIDE-25) 37.5-25 MG per tablet Take 1 each (1 tablet total) by mouth daily.  90 tablet  2    Review of Systems:  As per HPI- otherwise negative.   Physical Examination: Filed Vitals:   05/25/12 0805  BP: 126/80  Pulse: 94  Temp: 98.8 F (37.1 C)  Resp: 16   Filed Vitals:   05/25/12 0805  Height: 5\' 4"  (1.626 m)  Weight: 180 lb (81.647 kg)   Body mass index is 30.90 kg/(m^2). Ideal Body Weight: Weight in (lb) to have BMI = 25: 145.3   GEN: WDWN, NAD, Non-toxic, A & O x 3, overweight HEENT: Atraumatic, Normocephalic. Neck supple. No masses, No LAD.  TM and oropharynx wnl, PEERL, EOMI Ears and Nose: No external deformity.  CV: RRR, No M/G/R. No JVD. No thrill. No extra heart sounds. PULM: CTA B, no wheezes, crackles, rhonchi. No retractions. No resp. distress. No accessory muscle use. Trunk: I am able to reproduce her pain by pressing on her trapezius/ lat area bilaterally, and by moving her bilateral shoulders in flexion and abduction ABD: S, NT, ND, +BS. No rebound. No HSM. EXTR: No c/c/e, no calf swelling or tenderness NEURO Normal gait.  PSYCH: Normally interactive. Conversant. Not depressed or anxious appearing.  Calm demeanor.    EKG:  NSR, no ST elevation or depression, no evidence of LVH  [facility] reading (PRIMARY) by  Dr. Patsy Lager.  CXR: borderline cardiomegaly- ?compare to old CXR dated 04/07/10 CHEST - 2 VIEW  Comparison: No old films available.  Findings: Heart is borderline in size. Lungs are clear. No effusions or edema. No acute bony abnormality.  IMPRESSION: Borderline heart size. No acute findings.  Assessment and  Plan: 1. Acute upper back pain  EKG 12-Lead, DG Chest 2 View, cyclobenzaprine (FLEXERIL) 10 MG tablet, traMADol (ULTRAM) 50 MG tablet   MSK back pain.  Will try medications as above.  She does not have to work for the next 4 days so hope that her symptoms will resolve with rest.  She will let me know if she is getting worse or if her symptoms are changing, or if the medications are not helpful.  Will refer for an echo to evaluate her borderline cardiomegaly on CX.    Abbe Amsterdam, MD

## 2012-05-26 ENCOUNTER — Ambulatory Visit (INDEPENDENT_AMBULATORY_CARE_PROVIDER_SITE_OTHER): Payer: BC Managed Care – PPO | Admitting: Family Medicine

## 2012-05-26 VITALS — BP 155/95 | HR 115 | Temp 98.8°F | Resp 16 | Ht 62.5 in | Wt 182.0 lb

## 2012-05-26 DIAGNOSIS — R0789 Other chest pain: Secondary | ICD-10-CM

## 2012-05-26 DIAGNOSIS — R071 Chest pain on breathing: Secondary | ICD-10-CM

## 2012-05-26 MED ORDER — PREDNISONE 20 MG PO TABS: ORAL_TABLET | ORAL | Status: AC

## 2012-05-26 MED ORDER — HYDROCODONE-ACETAMINOPHEN 5-500 MG PO TABS: 1.0000 | ORAL_TABLET | Freq: Every evening | ORAL | Status: AC | PRN

## 2012-05-26 NOTE — Telephone Encounter (Signed)
Spoke to her and she is advised of medications prescribed and if she has worsening she is to come in. She feels better today

## 2012-05-26 NOTE — Progress Notes (Signed)
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  She saw Dr. Patsy Lager yesterday and she had a full evaluation with chest x-ray and EKG, then given muscle relaxer and tramadol.  She goes back to work on Monday.  So far, patient has taken ibuprofen originally, and then the medicines yesterday.  Pain worse with deep breath Pain rating is now 5/10  Objective:  NAD Chest:  Nontender, clear Heart:  Regular with murmur, rub or gallop Abdomen:  Normal  Assessment:  Muscular type pain  Plan Stop other meds

## 2012-05-30 ENCOUNTER — Other Ambulatory Visit: Payer: Self-pay

## 2012-05-30 DIAGNOSIS — I517 Cardiomegaly: Secondary | ICD-10-CM

## 2012-06-07 ENCOUNTER — Ambulatory Visit (INDEPENDENT_AMBULATORY_CARE_PROVIDER_SITE_OTHER): Payer: BC Managed Care – PPO | Admitting: Family Medicine

## 2012-06-07 VITALS — BP 151/88 | HR 105 | Temp 99.6°F | Resp 16 | Ht 64.0 in | Wt 182.2 lb

## 2012-06-07 DIAGNOSIS — M545 Low back pain, unspecified: Secondary | ICD-10-CM

## 2012-06-07 DIAGNOSIS — K219 Gastro-esophageal reflux disease without esophagitis: Secondary | ICD-10-CM

## 2012-06-07 DIAGNOSIS — M792 Neuralgia and neuritis, unspecified: Secondary | ICD-10-CM

## 2012-06-07 DIAGNOSIS — M549 Dorsalgia, unspecified: Secondary | ICD-10-CM

## 2012-06-07 MED ORDER — BACLOFEN 10 MG PO TABS: 10.0000 mg | ORAL_TABLET | Freq: Three times a day (TID) | ORAL | Status: AC

## 2012-06-07 MED ORDER — AMITRIPTYLINE HCL 25 MG PO TABS: 25.0000 mg | ORAL_TABLET | Freq: Every day | ORAL | Status: DC

## 2012-06-07 MED ORDER — OMEPRAZOLE 40 MG PO CPDR: 40.0000 mg | DELAYED_RELEASE_CAPSULE | Freq: Every day | ORAL | Status: DC

## 2012-06-07 NOTE — Patient Instructions (Addendum)
I think that you've have a back strain. The burning in your back is from the tight muscles.  Take the Baclofen at night to help with this.   The burning is also from nerve pain.  The amitriptyline will help with this.   For the acid in your stomach taking the Omeprazole one pill a day for the next 6 weeks and then see how you feel.  I hope you feel better!

## 2012-06-07 NOTE — Progress Notes (Signed)
Patient ID: Megan Lang, female   DOB: 10-18-1971, 41 y.o.   MRN: [id number] Megan Lang is a 41 y.o. female who presents to Urgent Care today for back and chest pain:    1.  Back pain/back pain:  Please see previous OV notes.  Patient seen here about 2 weeks ago with increasing back and chest pain after episodes of heavy lifting at work.  Provided Flexeril and Tramadol which did not provide relief, came back next day and was given Vicodin and Prednisone.  Felt Vicodin made pain worse but Prednisone helped.     Since then, chest pain is better but has changed in quality.  Now tingling on Left side that radiates around Left ribs, also with burning after acidic meals or carbonated soft drinks.  Chest pain not affected by exertion.  Back pain is somewhat better but still with pain on Left side.  Was having pain both Right and Left side about 2 weeks ago.  Taking Alleve for past 2 weeks which does help with the pain.  Has not taken any further Vicodin or Tramadol.  Describes as shooting, burning pain that occurs for a few moments and then resolves. Worse after she has had a busy day at work, better when she can do lighter duty or sits most of day.  No bladder or bowel incontinence.  No upper extremity weakness, numbness or tingling.   No shortness of breath.  No nausea, no vomiting.  NO diaphoresis.       PMH reviewed.  ROS as above otherwise neg.  No palpitations, SOB, Fever, Chills, Abd pain, N/V/D.  Medications reviewed. Current Outpatient Prescriptions  Medication Sig Dispense Refill  . amLODipine (NORVASC) 10 MG tablet Take 1 tablet (10 mg total) by mouth daily.  90 tablet  3  . Cetirizine HCl (ZYRTEC ALLERGY PO) Take by mouth.        . medroxyPROGESTERone (DEPO-PROVERA) 150 MG/ML injection INJECT INTRAMUSCULARLY AS DIRECTED  1 mL  2  . guaiFENesin (ROBITUSSIN) 100 MG/5ML liquid Take 200 mg by mouth 3 (three) times daily as needed.      . triamterene-hydrochlorothiazide (MAXZIDE-25) 37.5-25 MG  per tablet Take 1 each (1 tablet total) by mouth daily.  90 tablet  2    Exam:  BP 151/88  Pulse 105  Temp 99.6 F (37.6 C) (Oral)  Resp 16  Ht 5\' 4"  (1.626 m)  Wt 182 lb 3.2 oz (82.645 kg)  BMI 31.27 kg/m2  SpO2 100% Gen: Well NAD HEENT: EOMI,  MMM Lungs: CTABL Nl WOB Heart: RRR no MRG Chest:  No tenderness noted anterior chest wall, some tenderness to palpation along Left side of chest wall mid-axillary line.   Abd: NABS, NT, ND Back:  Normal skin, Spine with normal alignment and no deformity.  No tenderness to vertebral process palpation.  Paraspinous muscles are tender and with spasm on Left side, thoracic region.  Palpable spasm noted here.   Range of motion is full at neck and lumbar sacral regions.  Straight leg raise is negative for back pain Neuro:  Sensation and motor function 5/5 bilateral lower extremities.  Patellar and Achilles  DTR's +2 patellar BL   Exts: Non edematous BL  LE, warm and well perfused.   Assessment and Plan:  1.  Back pain:  Palpable spasm today.  Switched from Flexeril to Baclofen, she did not like Flexeril previously.  Counseled on heat and massage as well to help relieve spasm.  Pain still only  present for 2 weeks, imaging not yet indicated.  FU in 2 weeks if not better or sooner if chest pain recurs.    2.  Neuropathic pain:  Secondary to muscle spasm.  Amitriptyline to treat for short term.    3.  GERD:  Consistent with burning after acidic foods and carbonated beverages.  Counseled on GERD education.  Omeprazole x 6 weeks, then re-assess for relief.

## 2012-06-22 ENCOUNTER — Telehealth: Payer: Self-pay | Admitting: Family Medicine

## 2012-06-22 NOTE — Telephone Encounter (Signed)
Yes - this sounds great.

## 2012-06-22 NOTE — Telephone Encounter (Signed)
Patient called in complaining about side effects from her medicine she was given on 06/07/12.  States that ever since taking the medicine, she has experienced chest pain on and off in the evenings.  For the past week she has had left shoulder pain, and for 2 days has had lower abdominal cramping.  She believes it is in relation to taking the Elavil and Prilosec.    She has seen [facility] for her heart and they advised that tests were normal other than showing that she has a rapid heartbeat.  They recommended she follow up with [place] regarding her symptoms.  I advised patient to return to the clinic today to follow up regarding these symptoms, patient understood.  Ok with plan?

## 2012-07-28 ENCOUNTER — Ambulatory Visit (INDEPENDENT_AMBULATORY_CARE_PROVIDER_SITE_OTHER): Payer: BC Managed Care – PPO | Admitting: Anesthesiology

## 2012-07-28 DIAGNOSIS — IMO0001 Reserved for inherently not codable concepts without codable children: Secondary | ICD-10-CM

## 2012-07-28 DIAGNOSIS — Z23 Encounter for immunization: Secondary | ICD-10-CM

## 2012-07-28 DIAGNOSIS — Z309 Encounter for contraceptive management, unspecified: Secondary | ICD-10-CM

## 2012-07-28 MED ORDER — MEDROXYPROGESTERONE ACETATE 150 MG/ML IM SUSP
150.0000 mg | Freq: Once | INTRAMUSCULAR | Status: AC
  Administered 2012-07-28: 150 mg via INTRAMUSCULAR

## 2012-08-14 ENCOUNTER — Ambulatory Visit: Payer: BC Managed Care – PPO | Admitting: Gynecology

## 2012-08-15 ENCOUNTER — Telehealth: Payer: Self-pay

## 2012-08-15 NOTE — Telephone Encounter (Signed)
EKG and CXR report ready for pickup. Patient notified.

## 2012-08-15 NOTE — Telephone Encounter (Signed)
The patient called to request ekg and chest x ray records.  Please call patient at [phone] when ready for pick up.

## 2012-10-09 ENCOUNTER — Ambulatory Visit: Payer: BC Managed Care – PPO

## 2012-10-09 ENCOUNTER — Ambulatory Visit (INDEPENDENT_AMBULATORY_CARE_PROVIDER_SITE_OTHER): Payer: BC Managed Care – PPO | Admitting: Emergency Medicine

## 2012-10-09 VITALS — BP 133/84 | HR 104 | Temp 98.9°F | Resp 17 | Ht 63.5 in | Wt 173.0 lb

## 2012-10-09 DIAGNOSIS — J40 Bronchitis, not specified as acute or chronic: Secondary | ICD-10-CM

## 2012-10-09 DIAGNOSIS — R05 Cough: Secondary | ICD-10-CM

## 2012-10-09 DIAGNOSIS — J45909 Unspecified asthma, uncomplicated: Secondary | ICD-10-CM

## 2012-10-09 DIAGNOSIS — R059 Cough, unspecified: Secondary | ICD-10-CM

## 2012-10-09 MED ORDER — HYDROCODONE-HOMATROPINE 5-1.5 MG/5ML PO SYRP: 5.0000 mL | ORAL_SOLUTION | Freq: Three times a day (TID) | ORAL | Status: DC | PRN

## 2012-10-09 MED ORDER — ALBUTEROL SULFATE (2.5 MG/3ML) 0.083% IN NEBU
2.5000 mg | INHALATION_SOLUTION | Freq: Once | RESPIRATORY_TRACT | Status: AC
  Administered 2012-10-09: 2.5 mg via RESPIRATORY_TRACT

## 2012-10-09 MED ORDER — AZITHROMYCIN 250 MG PO TABS: ORAL_TABLET | ORAL | Status: DC

## 2012-10-09 MED ORDER — ALBUTEROL SULFATE HFA 108 (90 BASE) MCG/ACT IN AERS: 2.0000 | INHALATION_SPRAY | RESPIRATORY_TRACT | Status: DC | PRN

## 2012-10-09 NOTE — Progress Notes (Deleted)
  Subjective:    Patient ID: Megan Lang, female    DOB: 1971-07-17, 41 y.o.   MRN: [id number]  HPI    Review of Systems     Objective:   Physical Exam        Assessment & Plan:

## 2012-10-09 NOTE — Patient Instructions (Signed)

## 2012-10-09 NOTE — Progress Notes (Signed)
  Subjective:    Patient ID: Megan Lang, female    DOB: [birth year], 41 y.o.   MRN: [id number]  HPI patient has been sick for the last 2 weeks. She has had a bad cough with tightness in her chest the she's had no fever but has had intermittent wheezing this is the phlegm has been yellow in color.  She has a burning sensation in her to chest. Her daughter was ill prior to her becoming sick to    Review of Systems     Objective:   Physical Exam HEENT exam reveals nasal congestion. Posterior pharynx clear. Chest exam reveals mild prolongation of expiration but no true wheezes  [facility] reading (PRIMARY) by  Dr. Cleta Alberts No acute disease          Assessment & Plan:    Patient seen with posterior infection with secondary bronchitis and reactive airways disease she will be treated with an inhaler Z-Pak and Hycodan for cough

## 2012-10-23 ENCOUNTER — Ambulatory Visit (INDEPENDENT_AMBULATORY_CARE_PROVIDER_SITE_OTHER): Payer: BC Managed Care – PPO | Admitting: Anesthesiology

## 2012-10-23 DIAGNOSIS — Z309 Encounter for contraceptive management, unspecified: Secondary | ICD-10-CM

## 2012-10-23 MED ORDER — MEDROXYPROGESTERONE ACETATE 150 MG/ML IM SUSP
150.0000 mg | Freq: Once | INTRAMUSCULAR | Status: AC
  Administered 2012-10-23: 150 mg via INTRAMUSCULAR

## 2012-10-27 ENCOUNTER — Encounter: Payer: BC Managed Care – PPO | Admitting: Gynecology

## 2012-10-30 ENCOUNTER — Ambulatory Visit (INDEPENDENT_AMBULATORY_CARE_PROVIDER_SITE_OTHER): Payer: BC Managed Care – PPO | Admitting: Gynecology

## 2012-10-30 ENCOUNTER — Encounter: Payer: Self-pay | Admitting: Gynecology

## 2012-10-30 ENCOUNTER — Other Ambulatory Visit: Payer: Self-pay | Admitting: Gynecology

## 2012-10-30 VITALS — BP 130/90 | Ht 62.5 in | Wt 179.0 lb

## 2012-10-30 DIAGNOSIS — N898 Other specified noninflammatory disorders of vagina: Secondary | ICD-10-CM

## 2012-10-30 DIAGNOSIS — R635 Abnormal weight gain: Secondary | ICD-10-CM

## 2012-10-30 DIAGNOSIS — Z1231 Encounter for screening mammogram for malignant neoplasm of breast: Secondary | ICD-10-CM

## 2012-10-30 DIAGNOSIS — Z01419 Encounter for gynecological examination (general) (routine) without abnormal findings: Secondary | ICD-10-CM

## 2012-10-30 DIAGNOSIS — R232 Flushing: Secondary | ICD-10-CM | POA: Insufficient documentation

## 2012-10-30 LAB — CBC WITH DIFFERENTIAL/PLATELET
Hemoglobin: 13.9 g/dL (ref 12.0–15.0)
Lymphocytes Relative: 46 % (ref 12–46)
Lymphs Abs: 1.8 10*3/uL (ref 0.7–4.0)
Monocytes Relative: 8 % (ref 3–12)
Neutrophils Relative %: 44 % (ref 43–77)
Platelets: 311 10*3/uL (ref 150–400)
RBC: 4.7 MIL/uL (ref 3.87–5.11)
WBC: 3.8 10*3/uL — ABNORMAL LOW (ref 4.0–10.5)

## 2012-10-30 LAB — WET PREP FOR TRICH, YEAST, CLUE: Yeast Wet Prep HPF POC: NONE SEEN

## 2012-10-30 MED ORDER — METRONIDAZOLE 500 MG PO TABS: 500.0000 mg | ORAL_TABLET | Freq: Two times a day (BID) | ORAL | Status: DC

## 2012-10-30 NOTE — Patient Instructions (Addendum)
Mammogram A mammogram is an X-ray test to find changes in a woman's breast. You should get a mammogram if:  You are over 42 years of age.   You have risk factors.   Your doctor recommends that you have one.  BEFORE THE TEST  Do not schedule the test the week before your period, especially if your breasts are sore during this time.  On the day of your mammogram:  Wash your breasts and armpits well. After washing, do not put on any deodorant or talcum powder on until after your test.   Eat and drink as you usually do.   Take your medicines as usual.   If you are diabetic and take insulin, make sure you:   Eat before coming for your test.   Take your insulin as usual.   If you cannot keep your appointment, call before the appointment to cancel. Schedule another appointment.  TEST  You will need to undress from the waist up. You will put on a hospital gown.   Your breast will be put on the mammogram machine, and it will press firmly on your breast with a piece of plastic called a compression paddle. This will make your breast flatter so that the machine can X-ray all parts of your breast.   Both breasts will be X-rayed. Each breast will be X-rayed from above and from the side. An X-ray might need to be taken again if the picture is not good enough.   The mammogram will last about 15 to 30 minutes.  AFTER THE TEST Finding out the results of your test Ask when your test results will be ready. Make sure you get your test results. Document Released: 12/31/2008 Document Revised: 09/23/2011 Document Reviewed: 12/31/2008 [facility] Patient Information 2012 [place], [place].  Bacterial Vaginosis Bacterial vaginosis (BV) is a vaginal infection where the normal balance of bacteria in the vagina is disrupted. The normal balance is then replaced by an overgrowth of certain bacteria. There are several different kinds of bacteria that can cause BV. BV is the most common vaginal infection in  women of childbearing age. CAUSES   The cause of BV is not fully understood. BV develops when there is an increase or imbalance of harmful bacteria.  Some activities or behaviors can upset the normal balance of bacteria in the vagina and put women at increased risk including:  Having a new sex partner or multiple sex partners.  Douching.  Using an intrauterine device (IUD) for contraception.  It is not clear what role sexual activity plays in the development of BV. However, women that have never had sexual intercourse are rarely infected with BV. Women do not get BV from toilet seats, bedding, swimming pools or from touching objects around them.  SYMPTOMS   Grey vaginal discharge.  A fish-like odor with discharge, especially after sexual intercourse.  Itching or burning of the vagina and vulva.  Burning or pain with urination.  Some women have no signs or symptoms at all. DIAGNOSIS  Your caregiver must examine the vagina for signs of BV. Your caregiver will perform lab tests and look at the sample of vaginal fluid through a microscope. They will look for bacteria and abnormal cells (clue cells), a pH test higher than 4.5, and a positive amine test all associated with BV.  RISKS AND COMPLICATIONS   Pelvic inflammatory disease (PID).  Infections following gynecology surgery.  Developing HIV.  Developing herpes virus. TREATMENT  Sometimes BV will clear up without treatment.  However, all women with symptoms of BV should be treated to avoid complications, especially if gynecology surgery is planned. Female partners generally do not need to be treated. However, BV may spread between female sex partners so treatment is helpful in preventing a recurrence of BV.   BV may be treated with antibiotics. The antibiotics come in either pill or vaginal cream forms. Either can be used with nonpregnant or pregnant women, but the recommended dosages differ. These antibiotics are not harmful to the  baby.  BV can recur after treatment. If this happens, a second round of antibiotics will often be prescribed.  Treatment is important for pregnant women. If not treated, BV can cause a premature delivery, especially for a pregnant woman who had a premature birth in the past. All pregnant women who have symptoms of BV should be checked and treated.  For chronic reoccurrence of BV, treatment with a type of prescribed gel vaginally twice a week is helpful. HOME CARE INSTRUCTIONS   Finish all medication as directed by your caregiver.  Do not have sex until treatment is completed.  Tell your sexual partner that you have a vaginal infection. They should see their caregiver and be treated if they have problems, such as a mild rash or itching.  Practice safe sex. Use condoms. Only have 1 sex partner. PREVENTION  Basic prevention steps can help reduce the risk of upsetting the natural balance of bacteria in the vagina and developing BV:  Do not have sexual intercourse (be abstinent).  Do not douche.  Use all of the medicine prescribed for treatment of BV, even if the signs and symptoms go away.  Tell your sex partner if you have BV. That way, they can be treated, if needed, to prevent reoccurrence. SEEK MEDICAL CARE IF:   Your symptoms are not improving after 3 days of treatment.  You have increased discharge, pain, or fever. MAKE SURE YOU:   Understand these instructions.  Will watch your condition.  Will get help right away if you are not doing well or get worse. FOR MORE INFORMATION  Division of STD Prevention (DSTDP), Centers for Disease Control and Prevention: [employer]Apps.[employer].za American Social Health Association (ASHA): www.ashastd.org  Document Released: 10/04/2005 Document Revised: 12/27/2011 Document Reviewed: 03/27/2009 [facility] Patient Information 2013 [place], [place].

## 2012-10-30 NOTE — Progress Notes (Signed)
Megan Lang [birth year] [id number]   History:    42 y.o.  for annual gyn exam with the only complaint was of flushing. Her primary physician has her on Norvasc for hypertension. She has not had any lab work in over year. She is using the Depo-Provera every 3 months for contraception and is not having menstrual cycles. She is taking calcium and vitamin D daily. She states she exercises regularly although she is slightly overweight. She frequently does her self breast examination and has not had a baseline mammogram. No prior abnormal Pap smears reported  Past medical history,surgical history, family history and social history were all reviewed and documented in the EPIC chart.  Gynecologic History No LMP recorded. Patient has had an injection. Contraception: Depo-Provera injections Last Pap: 2013. Results were: Normal Last mammogram: No prior study. Results were: No prior study  Obstetric History OB History    Grav Para Term Preterm Abortions TAB SAB Ect Mult Living   3 3 3       3      # Outc Date GA Lbr Len/2nd Wgt Sex Del Anes PTL Lv   1 TRM     F SVD  No Yes   2 TRM     F SVD  No Yes   3 TRM     F SVD  No Yes       ROS: A ROS was performed and pertinent positives and negatives are included in the history.  GENERAL: No fevers or chills. HEENT: No change in vision, no earache, sore throat or sinus congestion. NECK: No pain or stiffness. CARDIOVASCULAR: No chest pain or pressure. No palpitations. PULMONARY: No shortness of breath, cough or wheeze. GASTROINTESTINAL: No abdominal pain, nausea, vomiting or diarrhea, melena or bright red blood per rectum. GENITOURINARY: No urinary frequency, urgency, hesitancy or dysuria. MUSCULOSKELETAL: No joint or muscle pain, no back pain, no recent trauma. DERMATOLOGIC: No rash, no itching, no lesions. ENDOCRINE: No polyuria, polydipsia, no heat or cold intolerance. No recent change in weight. HEMATOLOGICAL: No anemia or easy bruising or bleeding. NEUROLOGIC:  No headache, seizures, numbness, tingling or weakness. PSYCHIATRIC: No depression, no loss of interest in normal activity or change in sleep pattern.     Exam: chaperone present  BP 130/90  Ht 5' 2.5" (1.588 m)  Wt 179 lb (81.194 kg)  BMI 32.22 kg/m2  Body mass index is 32.22 kg/(m^2).  General appearance : Well developed well nourished female. No acute distress HEENT: Neck supple, trachea midline, no carotid bruits, no thyroidmegaly Lungs: Clear to auscultation, no rhonchi or wheezes, or rib retractions  Heart: Regular rate and rhythm, no murmurs or gallops Breast:Examined in sitting and supine position were symmetrical in appearance, no palpable masses or tenderness,  no skin retraction, no nipple inversion, no nipple discharge, no skin discoloration, no axillary or supraclavicular lymphadenopathy Abdomen: no palpable masses or tenderness, no rebound or guarding Extremities: no edema or skin discoloration or tenderness  Pelvic:  Bartholin, Urethra, Skene Glands: Within normal limits             Vagina: No gross lesions or discharge, creamy gray discharge without odor  Cervix: No gross lesions or discharge  Uterus  anteverted, normal size, shape and consistency, non-tender and mobile  Adnexa  Without masses or tenderness  Anus and perineum  normal   Rectovaginal  normal sphincter tone without palpated masses or tenderness             Hemoccult not indicated  Wet prep: Clear cells were noted with many white blood cells and too numerous to count bacteria  Assessment/Plan:  42 y.o. female for annual exam with clinical evidence of bacterial vaginosis. Patient will be placed on Flagyl 500 mg one by mouth 3 times a day for 5 days. Patient is in a monogamous relationship. Patient with no prior history of abnormal Pap smear. No Pap smear done today. New screening guidelines discussed. Patient reminded to do her monthly self breast examination. We discussed importance of calcium and vitamin  D for osteoporosis prevention especially since she is taking Depo-Provera every 3 months for contraception. The following labs were ordered today: TSH, hemoglobin A1c, screening cholesterol, CBC and urinalysis. Patient did receive in 2013 her flu vaccine. She will continue to follow with her primary for hypertension and possibly adjustment of her medication because of her flushing.    [name] MD, 10:36 AM 10/30/2012

## 2012-10-31 ENCOUNTER — Telehealth: Payer: Self-pay | Admitting: *Deleted

## 2012-10-31 LAB — URINALYSIS W MICROSCOPIC + REFLEX CULTURE
Casts: NONE SEEN
Glucose, UA: NEGATIVE mg/dL
Leukocytes, UA: NEGATIVE
Nitrite: NEGATIVE
Specific Gravity, Urine: 1.023 (ref 1.005–1.030)
Squamous Epithelial / LPF: NONE SEEN
pH: 6 (ref 5.0–8.0)

## 2012-10-31 LAB — HEMOGLOBIN A1C: Mean Plasma Glucose: 114 mg/dL (ref <117)

## 2012-10-31 LAB — TSH: TSH: 1.152 u[IU]/mL (ref 0.350–4.500)

## 2012-10-31 NOTE — Telephone Encounter (Signed)
Pt informed with current lab result on 10/30/12.

## 2012-11-21 ENCOUNTER — Ambulatory Visit: Payer: BC Managed Care – PPO

## 2012-12-20 ENCOUNTER — Ambulatory Visit
Admission: RE | Admit: 2012-12-20 | Discharge: 2012-12-20 | Disposition: A | Discharge status: Home / Self Care | Payer: BC Managed Care – PPO | Source: Ambulatory Visit | Attending: Gynecology | Admitting: Gynecology

## 2012-12-20 DIAGNOSIS — Z1231 Encounter for screening mammogram for malignant neoplasm of breast: Secondary | ICD-10-CM

## 2013-01-04 ENCOUNTER — Ambulatory Visit (INDEPENDENT_AMBULATORY_CARE_PROVIDER_SITE_OTHER): Payer: BC Managed Care – PPO | Admitting: Family Medicine

## 2013-01-04 VITALS — BP 158/100 | HR 82 | Temp 98.3°F | Resp 12 | Ht 63.0 in | Wt 178.0 lb

## 2013-01-04 DIAGNOSIS — M7710 Lateral epicondylitis, unspecified elbow: Secondary | ICD-10-CM

## 2013-01-04 DIAGNOSIS — M7711 Lateral epicondylitis, right elbow: Secondary | ICD-10-CM

## 2013-01-04 DIAGNOSIS — M25579 Pain in unspecified ankle and joints of unspecified foot: Secondary | ICD-10-CM

## 2013-01-04 DIAGNOSIS — I10 Essential (primary) hypertension: Secondary | ICD-10-CM

## 2013-01-04 DIAGNOSIS — M25531 Pain in right wrist: Secondary | ICD-10-CM

## 2013-01-04 LAB — COMPREHENSIVE METABOLIC PANEL
AST: 18 U/L (ref 0–37)
Albumin: 4.8 g/dL (ref 3.5–5.2)
Alkaline Phosphatase: 92 U/L (ref 39–117)
BUN: 8 mg/dL (ref 6–23)
Creat: 0.74 mg/dL (ref 0.50–1.10)
Glucose, Bld: 81 mg/dL (ref 70–99)

## 2013-01-04 MED ORDER — HYDROCHLOROTHIAZIDE 25 MG PO TABS: 12.5000 mg | ORAL_TABLET | Freq: Every day | ORAL | Status: DC

## 2013-01-04 NOTE — Patient Instructions (Addendum)
Start on the HCTZ 25 mg- start with a 1/2 pill or 12.5 mg a day.  We will need to monitor your blood pressure to see how you respond.  Please call/ email in about 2 weeks with an update.  We will increase your blood pressure med to a whole pill then if needed.    We will need to monitor your electrolytes/ potassium while you are on blood pressure medication.  We can do this periodically   Please use your wrist brace as needed.  If your wrist/ elbow is not better in the next couple of weeks please give [place] or Dr. Melvyn Novas a call

## 2013-01-04 NOTE — Progress Notes (Signed)
[facility] [address], [place] [address] [phone]  Date:  01/04/2013   Name:  Megan Lang   DOB:  April 30, 1971   MRN:  [id number]  PCP:  Abbe Amsterdam, MD    Chief Complaint: Hypertension and rt wrist pain   History of Present Illness:  Megan Lang is a 42 y.o. very pleasant female patient who presents with the following:  She is here today to discuss her HTN.  She had most recently been on norvasc for her HTN. (previously she had been on maxzide, changed to norvasc about one year ago).   However, she stopped taking this in January due to concerns about HA and night sweats which she thought might be related.  She does feel that her HA and nightsweats are now better.  She has seen Dr. Lily Peer who did not feel she was in menopause.   Since she stopped her BP medication she has noted that he BP was "slightly high," around 145/90 or so when she would check it at home.     She has a history of left CT release several years ago per Dr. Melvyn Novas.  She is now having right wrist pain.  She notes that if she uses her right wrist a lot she will have pain, and sees a cyst on her dorsal right wrist.  She has noted this problem for about one week, but the cyst has been present for about 3 weeks.  She has not fallen or otherwise hurt her wrist that she can recall.  Also, her lateral elbow has started to hurt over the last few weeks as well   She is on Depo- provera without any plans to become pregnant.   Patient Active Problem List  Diagnosis  . Family history of colon cancer  . HTN (hypertension)  . Flushing  . Weight gain    Past Medical History  Diagnosis Date  . Normal spontaneous vaginal delivery     3  . Hypertension   . Asthma   . Allergy     Past Surgical History  Procedure Laterality Date  . Carpal tunnel release  2009    History  Substance Use Topics  . Smoking status: Never Smoker   . Smokeless tobacco: Never Used  . Alcohol Use: Yes   Comment: OCC- WINE    Family History  Problem Relation Age of Onset  . Hypertension Mother   . Cancer Father     COLON  . Breast cancer Maternal Grandmother   . Hypertension Sister     No Known Allergies  Medication list has been reviewed and updated.  Current Outpatient Prescriptions on File Prior to Visit  Medication Sig Dispense Refill  . albuterol (PROVENTIL HFA;VENTOLIN HFA) 108 (90 BASE) MCG/ACT inhaler Inhale 2 puffs into the lungs every 4 (four) hours as needed for wheezing (cough, shortness of breath or wheezing.).  1 Inhaler  1  . Cetirizine HCl (ZYRTEC ALLERGY PO) Take by mouth.        . medroxyPROGESTERone (DEPO-PROVERA) 150 MG/ML injection INJECT INTRAMUSCULARLY AS DIRECTED  1 mL  2  . amitriptyline (ELAVIL) 25 MG tablet Take 1 tablet (25 mg total) by mouth at bedtime.  60 tablet  0  . amLODipine (NORVASC) 10 MG tablet Take 1 tablet (10 mg total) by mouth daily.  90 tablet  3  . azithromycin (ZITHROMAX) 250 MG tablet Take 2 tabs PO x 1 dose, then 1 tab PO QD x 4  days  6 tablet  0  . guaiFENesin (ROBITUSSIN) 100 MG/5ML liquid Take 200 mg by mouth 3 (three) times daily as needed.      Marland Kitchen HYDROcodone-homatropine (HYCODAN) 5-1.5 MG/5ML syrup Take 5 mLs by mouth every 8 (eight) hours as needed for cough.  120 mL  0  . metroNIDAZOLE (FLAGYL) 500 MG tablet Take 1 tablet (500 mg total) by mouth 2 (two) times daily.  10 tablet  0  . omeprazole (PRILOSEC) 40 MG capsule Take 1 capsule (40 mg total) by mouth daily.  30 capsule  3  . triamterene-hydrochlorothiazide (MAXZIDE-25) 37.5-25 MG per tablet Take 1 each (1 tablet total) by mouth daily.  90 tablet  2   No current facility-administered medications on file prior to visit.    Review of Systems:  As per HPI- otherwise negative.   Physical Examination: Filed Vitals:   01/04/13 0944  BP: 158/100  Pulse: 82  Temp: 98.3 F (36.8 C)  Resp: 12   Filed Vitals:   01/04/13 0944  Height: 5\' 3"  (1.6 m)  Weight: 178 lb (80.74  kg)   Body mass index is 31.54 kg/(m^2). Ideal Body Weight: Weight in (lb) to have BMI = 25: 140.8  GEN: WDWN, NAD, Non-toxic, A & O x 3, overweight HEENT: Atraumatic, Normocephalic. Neck supple. No masses, No LAD. Ears and Nose: No external deformity. CV: RRR, No M/G/R. No JVD. No thrill. No extra heart sounds. PULM: CTA B, no wheezes, crackles, rhonchi. No retractions. No resp. distress. No accessory muscle use. ABD: S, NT, ND, +BS. No rebound. No HSM. EXTR: No c/c/e NEURO Normal gait.  PSYCH: Normally interactive. Conversant. Not depressed or anxious appearing.  Calm demeanor.  Right UE: she is slightly tender over the lateral eipcondyle.  Her right wrist has normal ROM, no swelling or redness.  However, a tiny cyst is present on the ulnar, ventral wrist which is tender.     Assessment and Plan: HTN (hypertension) - Plan: Comprehensive metabolic panel, hydrochlorothiazide (HYDRODIURIL) 25 MG tablet  Wrist pain, right  Lateral epicondylitis, right  Uncontrolled HTN- will start on HCTZ, 1/2 of a 25 mg pill for now, suspect will go to a whole pill.  Await CMP Placed in a velcro wrist splint today and gave work restrictions.  See patient instructions for more details.   Follow- up as per pt instructions  Signed Abbe Amsterdam, MD

## 2013-01-05 ENCOUNTER — Encounter: Payer: Self-pay | Admitting: Family Medicine

## 2013-01-18 ENCOUNTER — Ambulatory Visit (INDEPENDENT_AMBULATORY_CARE_PROVIDER_SITE_OTHER): Payer: BC Managed Care – PPO | Admitting: Anesthesiology

## 2013-01-18 DIAGNOSIS — Z309 Encounter for contraceptive management, unspecified: Secondary | ICD-10-CM

## 2013-01-18 DIAGNOSIS — IMO0001 Reserved for inherently not codable concepts without codable children: Secondary | ICD-10-CM

## 2013-01-18 MED ORDER — MEDROXYPROGESTERONE ACETATE 150 MG/ML IM SUSP
150.0000 mg | Freq: Once | INTRAMUSCULAR | Status: AC
  Administered 2013-01-18: 150 mg via INTRAMUSCULAR

## 2013-01-22 ENCOUNTER — Telehealth: Payer: Self-pay

## 2013-01-22 NOTE — Telephone Encounter (Signed)
Received message, thanks.  Called her- she will take a whole pill, let me know if BP still elevated.  Otherwise plan recheck here in 6 weeks

## 2013-01-22 NOTE — Telephone Encounter (Signed)
144/96, pt advised per your instructions to take a whole pill daily. She will do this and call back in a few days to advise me how this is working. To you FYI

## 2013-01-22 NOTE — Telephone Encounter (Signed)
Patient would like to let dr copland know that her blood pressure is still high and does she need to adjust how much she takes at a time [phone]

## 2013-04-09 ENCOUNTER — Other Ambulatory Visit: Payer: Self-pay | Admitting: Gynecology

## 2013-04-13 ENCOUNTER — Ambulatory Visit (INDEPENDENT_AMBULATORY_CARE_PROVIDER_SITE_OTHER): Payer: BC Managed Care – PPO | Admitting: Anesthesiology

## 2013-04-13 DIAGNOSIS — IMO0001 Reserved for inherently not codable concepts without codable children: Secondary | ICD-10-CM

## 2013-04-13 DIAGNOSIS — Z309 Encounter for contraceptive management, unspecified: Secondary | ICD-10-CM

## 2013-04-13 MED ORDER — MEDROXYPROGESTERONE ACETATE 150 MG/ML IM SUSP
150.0000 mg | Freq: Once | INTRAMUSCULAR | Status: AC
  Administered 2013-04-13: 150 mg via INTRAMUSCULAR

## 2013-06-16 ENCOUNTER — Ambulatory Visit (INDEPENDENT_AMBULATORY_CARE_PROVIDER_SITE_OTHER): Payer: BC Managed Care – PPO | Admitting: Emergency Medicine

## 2013-06-16 ENCOUNTER — Ambulatory Visit: Payer: BC Managed Care – PPO

## 2013-06-16 VITALS — BP 142/92 | HR 104 | Temp 98.4°F | Resp 18 | Ht 63.0 in | Wt 180.0 lb

## 2013-06-16 DIAGNOSIS — M545 Low back pain, unspecified: Secondary | ICD-10-CM

## 2013-06-16 MED ORDER — CYCLOBENZAPRINE HCL 10 MG PO TABS: ORAL_TABLET | ORAL | Status: DC

## 2013-06-16 MED ORDER — MELOXICAM 15 MG PO TABS: 15.0000 mg | ORAL_TABLET | Freq: Every day | ORAL | Status: DC

## 2013-06-16 NOTE — Progress Notes (Signed)
  Subjective:    Patient ID: Megan Lang, female    DOB: [birth year], 42 y.o.   MRN: [id number]  HPI  42 yo AA female with complaints of back pain - pt points to left sacral area.  Pain has been present off & on x 2 years.  Pt feels it is worse over last 2 weeks.  Heating pad & tylenol helps some.  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  Pt has never seen an MD for back pain and has never had xrays of her back.  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  No numbness or radiation into BLE.      Review of Systems     Objective:   Physical Exam th straight leg raising is positive at 90 on the right but the pain is on the left there straight leg raising is positive at 90 on the left. Deep tendon reflexes are 2+ and symmetrical. Motor strength is 5 out of 5 all muscle groups. Patient has significant discomfort with any twisting of the back and has limited flexion and extension ere is a mild scoliotic curve. There is tenderness over the lower lumbar spine which is worse on the left than the right.  [facility] reading (PRIMARY) by  Dr.Quinley Nesler there degenerative changes of the lumbar spine with mild narrowing of the joint space L3-L4 otherwise films are unremarkable        Assessment & Plan:  Patient seen with an LS strain.She does have mild radicular symptoms on the left.

## 2013-06-16 NOTE — Patient Instructions (Signed)

## 2013-06-29 ENCOUNTER — Telehealth: Payer: Self-pay

## 2013-06-29 NOTE — Telephone Encounter (Signed)
Pt states that her back is still is pain and she is unable to take the medications that were prescribed to her due to reactions to both. Best# [id number]

## 2013-06-29 NOTE — Telephone Encounter (Signed)
She wants to know if she can get referral.

## 2013-06-29 NOTE — Telephone Encounter (Signed)
Tell her we will be happy to refer her. Please put in and orders only referral to  [facility] orthopedics for patient to be evaluated for back pain.

## 2013-06-29 NOTE — Telephone Encounter (Signed)
She was given mobic and flexeril she states she had nausea and felt hot while taking these. Please advise.

## 2013-06-30 ENCOUNTER — Telehealth: Payer: Self-pay | Admitting: Family Medicine

## 2013-06-30 DIAGNOSIS — M549 Dorsalgia, unspecified: Secondary | ICD-10-CM

## 2013-06-30 NOTE — Telephone Encounter (Signed)
Spoke with patient  let her know that referral will be made and someone will give her a call about it

## 2013-06-30 NOTE — Telephone Encounter (Signed)
See referral

## 2013-07-06 ENCOUNTER — Ambulatory Visit (INDEPENDENT_AMBULATORY_CARE_PROVIDER_SITE_OTHER): Payer: BC Managed Care – PPO | Admitting: Anesthesiology

## 2013-07-06 DIAGNOSIS — Z309 Encounter for contraceptive management, unspecified: Secondary | ICD-10-CM

## 2013-07-06 MED ORDER — MEDROXYPROGESTERONE ACETATE 150 MG/ML IM SUSP
150.0000 mg | Freq: Once | INTRAMUSCULAR | Status: AC
  Administered 2013-07-06: 150 mg via INTRAMUSCULAR

## 2013-08-23 ENCOUNTER — Other Ambulatory Visit: Payer: Self-pay

## 2013-10-17 ENCOUNTER — Ambulatory Visit: Payer: BC Managed Care – PPO

## 2013-10-17 ENCOUNTER — Ambulatory Visit (INDEPENDENT_AMBULATORY_CARE_PROVIDER_SITE_OTHER): Payer: BC Managed Care – PPO | Admitting: Internal Medicine

## 2013-10-17 VITALS — BP 142/90 | HR 100 | Temp 99.5°F | Resp 18 | Ht 64.0 in | Wt 182.0 lb

## 2013-10-17 DIAGNOSIS — M79672 Pain in left foot: Secondary | ICD-10-CM

## 2013-10-17 DIAGNOSIS — R509 Fever, unspecified: Secondary | ICD-10-CM

## 2013-10-17 DIAGNOSIS — M722 Plantar fascial fibromatosis: Secondary | ICD-10-CM

## 2013-10-17 DIAGNOSIS — M79609 Pain in unspecified limb: Secondary | ICD-10-CM

## 2013-10-17 LAB — POCT CBC
Lymph, poc: 1.7 (ref 0.6–3.4)
MCH, POC: 29.6 pg (ref 27–31.2)
MCHC: 31.4 g/dL — AB (ref 31.8–35.4)
MID (cbc): 0.4 (ref 0–0.9)
MPV: 8.5 fL (ref 0–99.8)
POC Granulocyte: 2.4 (ref 2–6.9)
POC MID %: 9.1 %M (ref 0–12)
Platelet Count, POC: 267 10*3/uL (ref 142–424)
WBC: 4.6 10*3/uL (ref 4.6–10.2)

## 2013-10-17 LAB — POCT URINALYSIS DIPSTICK
Blood, UA: NEGATIVE
Glucose, UA: NEGATIVE
Ketones, UA: NEGATIVE
Protein, UA: 30
Spec Grav, UA: 1.02
Urobilinogen, UA: 0.2

## 2013-10-17 LAB — POCT UA - MICROSCOPIC ONLY
Bacteria, U Microscopic: NEGATIVE
Casts, Ur, LPF, POC: NEGATIVE
Crystals, Ur, HPF, POC: NEGATIVE
Mucus, UA: NEGATIVE

## 2013-10-17 MED ORDER — IBUPROFEN 600 MG PO TABS: 600.0000 mg | ORAL_TABLET | Freq: Three times a day (TID) | ORAL | Status: DC | PRN

## 2013-10-17 NOTE — Progress Notes (Signed)
Subjective:    Patient ID: Megan Lang, female    DOB: 03-21-1971, 42 y.o.   MRN: [id number]  HPI Pt reports heel pain started yesterday morning around 9:45 after having therapy on her back. Pt spoke to therapist after pain started who advised pt that the heat that was used on her back should not have caused heel pain. Pain constant at 2-3 but increases to 8 on scale of 1-10 when she walks on it. Stabbing pain, stays in heel, no radiating. Pt tried ice, Biofreeze, ibuprofen which may have helped a little. Pain is worst in the morning when first tries to walk. Also some inflammation. No previous problems w/heel pain. No reddness or heat in heel.  Temperature 99.7 repeated, does not feel feverish, chills or sweats. No reason for fever by hx.    Review of Systems     Objective:   Physical Exam  Vitals reviewed. Constitutional: She is oriented to person, place, and time. She appears well-developed and well-nourished. No distress.  HENT:  Head: Normocephalic.  Nose: Nose normal.  Eyes: EOM are normal. No scleral icterus.  Neck: Normal range of motion. Neck supple.  Pulmonary/Chest: Effort normal.  Musculoskeletal: She exhibits tenderness.       Left foot: She exhibits tenderness, bony tenderness and swelling. She exhibits normal range of motion, normal capillary refill, no crepitus, no deformity and no laceration.       Feet:  No red or warmth  Neurological: She is alert and oriented to person, place, and time. She has normal strength. No cranial nerve deficit or sensory deficit. She exhibits normal muscle tone. She displays a negative Romberg sign. Gait abnormal. Coordination normal.  Skin: No rash noted.  Psychiatric: She has a normal mood and affect.   Results for orders placed in visit on 10/17/13  POCT UA - MICROSCOPIC ONLY      Result Value Range   WBC, Ur, HPF, POC neg     RBC, urine, microscopic 1-3     Bacteria, U Microscopic neg     Mucus, UA neg     Epithelial cells,  urine per micros 0-1     Crystals, Ur, HPF, POC neg     Casts, Ur, LPF, POC neg     Yeast, UA neg    POCT URINALYSIS DIPSTICK      Result Value Range   Color, UA yellow     Clarity, UA clear     Glucose, UA neg     Bilirubin, UA neg     Ketones, UA neg     Spec Grav, UA 1.020     Blood, UA neg     pH, UA 7.0     Protein, UA 30     Urobilinogen, UA 0.2     Nitrite, UA neg     Leukocytes, UA Negative    POCT CBC      Result Value Range   WBC 4.6  4.6 - 10.2 K/uL   Lymph, poc 1.7  0.6 - 3.4   POC LYMPH PERCENT 37.9  10 - 50 %L   MID (cbc) 0.4  0 - 0.9   POC MID % 9.1  0 - 12 %M   POC Granulocyte 2.4  2 - 6.9   Granulocyte percent 53.0  37 - 80 %G   RBC 4.66  4.04 - 5.48 M/uL   Hemoglobin 13.8  12.2 - 16.2 g/dL   HCT, POC 82.9  56.2 - 47.9 %  MCV 94.2  80 - 97 fL   MCH, POC 29.6  27 - 31.2 pg   MCHC 31.4 (*) 31.8 - 35.4 g/dL   RDW, POC 16.1     Platelet Count, POC 267  142 - 424 K/uL   MPV 8.5  0 - 99.8 fL   [facility] reading (PRIMARY) by  Dr Perrin Maltese normal xray         Assessment & Plan:  Heel pain/Probable Plantar fasciitis RICE/Camwalker When pain decreases start rehab. Motrin 600mg  bid pc

## 2013-10-17 NOTE — Patient Instructions (Signed)

## 2013-10-19 ENCOUNTER — Ambulatory Visit (INDEPENDENT_AMBULATORY_CARE_PROVIDER_SITE_OTHER): Payer: BC Managed Care – PPO | Admitting: Anesthesiology

## 2013-10-19 DIAGNOSIS — N912 Amenorrhea, unspecified: Secondary | ICD-10-CM

## 2013-10-19 DIAGNOSIS — Z23 Encounter for immunization: Secondary | ICD-10-CM

## 2013-10-19 DIAGNOSIS — Z309 Encounter for contraceptive management, unspecified: Secondary | ICD-10-CM

## 2013-10-19 LAB — PREGNANCY, URINE: Preg Test, Ur: NEGATIVE

## 2013-10-19 MED ORDER — MEDROXYPROGESTERONE ACETATE 150 MG/ML IM SUSP
150.0000 mg | Freq: Once | INTRAMUSCULAR | Status: AC
  Administered 2013-10-19: 150 mg via INTRAMUSCULAR

## 2013-10-19 NOTE — Addendum Note (Signed)
Addended by: Su Grand A on: 10/19/2013 09:00 AM   Modules accepted: Orders

## 2013-10-19 NOTE — Progress Notes (Signed)
Patient was overdue for depo provera shot, urine pregnancy was done-  It was negative - shot given.

## 2013-10-26 ENCOUNTER — Telehealth: Payer: Self-pay

## 2013-10-26 NOTE — Telephone Encounter (Signed)
Patient is going to come to see dr guest on m

## 2013-11-12 ENCOUNTER — Encounter: Payer: Self-pay | Admitting: Gynecology

## 2013-11-12 ENCOUNTER — Ambulatory Visit (INDEPENDENT_AMBULATORY_CARE_PROVIDER_SITE_OTHER): Payer: BC Managed Care – PPO | Admitting: Gynecology

## 2013-11-12 VITALS — BP 136/90 | Ht 63.0 in | Wt 179.0 lb

## 2013-11-12 DIAGNOSIS — N898 Other specified noninflammatory disorders of vagina: Secondary | ICD-10-CM

## 2013-11-12 DIAGNOSIS — Z800 Family history of malignant neoplasm of digestive organs: Secondary | ICD-10-CM

## 2013-11-12 DIAGNOSIS — Z01419 Encounter for gynecological examination (general) (routine) without abnormal findings: Secondary | ICD-10-CM

## 2013-11-12 DIAGNOSIS — I10 Essential (primary) hypertension: Secondary | ICD-10-CM

## 2013-11-12 LAB — WET PREP FOR TRICH, YEAST, CLUE
Clue Cells Wet Prep HPF POC: NONE SEEN
Trich, Wet Prep: NONE SEEN
YEAST WET PREP: NONE SEEN

## 2013-11-12 LAB — COMPREHENSIVE METABOLIC PANEL
ALBUMIN: 4.5 g/dL (ref 3.5–5.2)
ALT: 24 U/L (ref 0–35)
AST: 19 U/L (ref 0–37)
Alkaline Phosphatase: 70 U/L (ref 39–117)
BUN: 8 mg/dL (ref 6–23)
CHLORIDE: 104 meq/L (ref 96–112)
CO2: 26 meq/L (ref 19–32)
Calcium: 9.5 mg/dL (ref 8.4–10.5)
Creat: 0.77 mg/dL (ref 0.50–1.10)
GLUCOSE: 93 mg/dL (ref 70–99)
POTASSIUM: 3.4 meq/L — AB (ref 3.5–5.3)
SODIUM: 137 meq/L (ref 135–145)
TOTAL PROTEIN: 7.5 g/dL (ref 6.0–8.3)
Total Bilirubin: 0.7 mg/dL (ref 0.3–1.2)

## 2013-11-12 LAB — CHOLESTEROL, TOTAL: CHOLESTEROL: 131 mg/dL (ref 0–200)

## 2013-11-12 NOTE — Patient Instructions (Signed)

## 2013-11-12 NOTE — Progress Notes (Signed)
Megan Lang Dec 19, 1970 [id number]   History:    43 y.o.  for annual gyn exam with no complaints today. She did not take her blood pressure medication today and her blood pressure was 132/90. Patient has been on the Depo-Provera injectable contraception her last dose was a few weeks ago. She has been taking calcium and vitamin D daily. Her weight has remained stable. The patient would know prior history of abnormal Pap smear. Last mammogram March 2014 normal.  Patient's father with history of colon cancer at the age of 52  Past medical history,surgical history, family history and social history were all reviewed and documented in the EPIC chart.  Gynecologic History No LMP recorded. Patient has had an injection. Contraception: Depo-Provera injections Last Pap: 2013. Results were: normal Last mammogram: 2014. Results were: normal  Obstetric History OB History  Gravida Para Term Preterm AB SAB TAB Ectopic Multiple Living  3 3 3       3     # Outcome Date GA Lbr Len/2nd Weight Sex Delivery Anes PTL Lv  3 TRM     F SVD  N Y  2 TRM     F SVD  N Y  1 TRM     F SVD  N Y       ROS: A ROS was performed and pertinent positives and negatives are included in the history.  GENERAL: No fevers or chills. HEENT: No change in vision, no earache, sore throat or sinus congestion. NECK: No pain or stiffness. CARDIOVASCULAR: No chest pain or pressure. No palpitations. PULMONARY: No shortness of breath, cough or wheeze. GASTROINTESTINAL: No abdominal pain, nausea, vomiting or diarrhea, melena or bright red blood per rectum. GENITOURINARY: No urinary frequency, urgency, hesitancy or dysuria. MUSCULOSKELETAL: No joint or muscle pain, no back pain, no recent trauma. DERMATOLOGIC: No rash, no itching, no lesions. ENDOCRINE: No polyuria, polydipsia, no heat or cold intolerance. No recent change in weight. HEMATOLOGICAL: No anemia or easy bruising or bleeding. NEUROLOGIC: No headache, seizures, numbness, tingling  or weakness. PSYCHIATRIC: No depression, no loss of interest in normal activity or change in sleep pattern.     Exam: chaperone present  BP 136/90  Ht 5\' 3"  (1.6 m)  Wt 179 lb (81.194 kg)  BMI 31.72 kg/m2  Body mass index is 31.72 kg/(m^2).  General appearance : Well developed well nourished female. No acute distress HEENT: Neck supple, trachea midline, no carotid bruits, no thyroidmegaly Lungs: Clear to auscultation, no rhonchi or wheezes, or rib retractions  Heart: Regular rate and rhythm, no murmurs or gallops Breast:Examined in sitting and supine position were symmetrical in appearance, no palpable masses or tenderness,  no skin retraction, no nipple inversion, no nipple discharge, no skin discoloration, no axillary or supraclavicular lymphadenopathy Abdomen: no palpable masses or tenderness, no rebound or guarding Extremities: no edema or skin discoloration or tenderness  Pelvic:  Bartholin, Urethra, Skene Glands: Within normal limits             Vagina: No gross lesions or discharge, slight vaginal discharge  Cervix: No gross lesions or discharge  Uterus  anteverted, normal size, shape and consistency, non-tender and mobile  Adnexa  Without masses or tenderness  Anus and perineum  normal   Rectovaginal  normal sphincter tone without palpated masses or tenderness             Hemoccult not done   Wet prep negative  Assessment/Plan:  43 y.o. female for annual exam with  strong family history of colorectal cancer. Patient's father had colon cancer at the age of 40. I have given her the name of our gastroenterologist and the community for her to schedule a consultation for screening. Pap smear not done today according to the violent. She had a recent CBC and urinalysis so only a comprehensive metabolic panel and screen cholesterol will be drawn today. She was reminded to take her blood pressure medication this morning. She was reminded to do her monthly breast exam. Her mammogram is  due in March. Flu vaccine up-to-date.  Note: This dictation was prepared with  Dragon/digital dictation along withSmart phrase technology. Any transcriptional errors that result from this process are unintentional.   Terrance Mass MD, 9:55 AM 11/12/2013

## 2013-11-19 ENCOUNTER — Other Ambulatory Visit: Payer: Self-pay

## 2013-11-19 DIAGNOSIS — Z1231 Encounter for screening mammogram for malignant neoplasm of breast: Secondary | ICD-10-CM

## 2013-12-24 ENCOUNTER — Ambulatory Visit
Admission: RE | Admit: 2013-12-24 | Discharge: 2013-12-24 | Disposition: A | Discharge status: Home / Self Care | Payer: BC Managed Care – PPO | Source: Ambulatory Visit

## 2013-12-24 DIAGNOSIS — Z1231 Encounter for screening mammogram for malignant neoplasm of breast: Secondary | ICD-10-CM

## 2013-12-25 ENCOUNTER — Telehealth: Payer: Self-pay | Admitting: *Deleted

## 2013-12-25 NOTE — Telephone Encounter (Signed)
Pt called c/o heavy bleeding , missed recent depo provera shot. Pt said bleeding seems to slow down, will call back if continues.

## 2014-01-14 ENCOUNTER — Other Ambulatory Visit: Payer: Self-pay | Admitting: Gynecology

## 2014-01-16 ENCOUNTER — Ambulatory Visit (INDEPENDENT_AMBULATORY_CARE_PROVIDER_SITE_OTHER): Payer: BC Managed Care – PPO | Admitting: Anesthesiology

## 2014-01-16 DIAGNOSIS — Z309 Encounter for contraceptive management, unspecified: Secondary | ICD-10-CM

## 2014-01-16 MED ORDER — MEDROXYPROGESTERONE ACETATE 150 MG/ML IM SUSP
150.0000 mg | Freq: Once | INTRAMUSCULAR | Status: AC
  Administered 2014-01-16: 150 mg via INTRAMUSCULAR

## 2014-04-05 ENCOUNTER — Ambulatory Visit (INDEPENDENT_AMBULATORY_CARE_PROVIDER_SITE_OTHER): Payer: BC Managed Care – PPO | Admitting: *Deleted

## 2014-04-05 DIAGNOSIS — Z3049 Encounter for surveillance of other contraceptives: Secondary | ICD-10-CM

## 2014-04-05 MED ORDER — MEDROXYPROGESTERONE ACETATE 150 MG/ML IM SUSP
150.0000 mg | Freq: Once | INTRAMUSCULAR | Status: AC
  Administered 2014-04-05: 150 mg via INTRAMUSCULAR

## 2014-05-17 ENCOUNTER — Ambulatory Visit (INDEPENDENT_AMBULATORY_CARE_PROVIDER_SITE_OTHER): Payer: BC Managed Care – PPO | Admitting: Family Medicine

## 2014-05-17 VITALS — BP 160/86 | HR 88 | Temp 98.6°F | Resp 18 | Ht 62.0 in | Wt 174.6 lb

## 2014-05-17 DIAGNOSIS — R05 Cough: Secondary | ICD-10-CM

## 2014-05-17 DIAGNOSIS — R059 Cough, unspecified: Secondary | ICD-10-CM

## 2014-05-17 DIAGNOSIS — I10 Essential (primary) hypertension: Secondary | ICD-10-CM

## 2014-05-17 LAB — BASIC METABOLIC PANEL
BUN: 10 mg/dL (ref 6–23)
CALCIUM: 9.9 mg/dL (ref 8.4–10.5)
CHLORIDE: 104 meq/L (ref 96–112)
CO2: 23 meq/L (ref 19–32)
CREATININE: 0.8 mg/dL (ref 0.50–1.10)
GLUCOSE: 128 mg/dL — AB (ref 70–99)
Potassium: 3.7 mEq/L (ref 3.5–5.3)
Sodium: 138 mEq/L (ref 135–145)

## 2014-05-17 MED ORDER — HYDROCHLOROTHIAZIDE 25 MG PO TABS: 25.0000 mg | ORAL_TABLET | Freq: Every day | ORAL | Status: DC

## 2014-05-17 MED ORDER — ALBUTEROL SULFATE HFA 108 (90 BASE) MCG/ACT IN AERS: 2.0000 | INHALATION_SPRAY | RESPIRATORY_TRACT | Status: DC | PRN

## 2014-05-17 NOTE — Progress Notes (Signed)
[facility] [address], [place] [address] [phone]  Date:  05/17/2014   Name:  Megan Lang   DOB:  1971-01-17   MRN:  [id number]  PCP:  Megan Blinks, MD    Chief Complaint: Medication Refill   History of Present Illness:  Megan Lang is a 43 y.o. very pleasant female patient who presents with the following:  She is here today for a medication refill.  She has been forgetting to take her HCTZ some of the time.  Also needs a RF of her inhaler.   She notes asthma sx when she is ill with an URI- otherwise she rarely needs her inhaler.   She is on Depo- provera Labs done in January.  Mild hypokalemia She is doing well overall.  She has been trying to lose weight but is not sure if she is being successful.    Wt Readings from Last 3 Encounters:  05/17/14 174 lb 9.6 oz (79.198 kg)  11/12/13 179 lb (81.194 kg)  10/17/13 182 lb (82.555 kg)    Patient Active Problem List   Diagnosis Date Noted  . Flushing 10/30/2012  . Weight gain 10/30/2012  . Family history of colon cancer 10/27/2011  . HTN (hypertension) 10/27/2011    Past Medical History  Diagnosis Date  . Normal spontaneous vaginal delivery     3  . Hypertension   . Asthma   . Allergy     Past Surgical History  Procedure Laterality Date  . Carpal tunnel release  2009    History  Substance Use Topics  . Smoking status: Never Smoker   . Smokeless tobacco: Never Used  . Alcohol Use: Yes     Comment: OCC- WINE    Family History  Problem Relation Age of Onset  . Hypertension Mother   . Cancer Father     COLON  . Breast cancer Maternal Grandmother   . Hypertension Sister     Allergies  Allergen Reactions  . Penicillins     Medication list has been reviewed and updated.  Current Outpatient Prescriptions on File Prior to Visit  Medication Sig Dispense Refill  . Cetirizine HCl (ZYRTEC ALLERGY PO) Take by mouth.        . hydrochlorothiazide (HYDRODIURIL) 25 MG tablet  Take 0.5 tablets (12.5 mg total) by mouth daily. Increase to a whole pill as directed  90 tablet  3  . ibuprofen (ADVIL,MOTRIN) 600 MG tablet Take 1 tablet (600 mg total) by mouth every 8 (eight) hours as needed.  30 tablet  0  . medroxyPROGESTERone (DEPO-PROVERA) 150 MG/ML injection INJECT INTRAMUSCLARLLY AS DIRECTED  1 mL  3   No current facility-administered medications on file prior to visit.    Review of Systems:  As per HPI- otherwise negative.   Physical Examination: Filed Vitals:   05/17/14 1032  BP: 160/86  Pulse: 88  Temp: 98.6 F (37 C)  Resp: 18   Filed Vitals:   05/17/14 1032  Height: 5\' 2"  (1.575 m)  Weight: 174 lb 9.6 oz (79.198 kg)   Body mass index is 31.93 kg/(m^2). Ideal Body Weight: Weight in (lb) to have BMI = 25: 136.4  GEN: WDWN, NAD, Non-toxic, A & O x 3, overweight HEENT: Atraumatic, Normocephalic. Neck supple. No masses, No LAD. Ears and Nose: No external deformity. CV: RRR, No M/G/R. No JVD. No thrill. No extra heart sounds. PULM: CTA B, no wheezes, crackles, rhonchi. No retractions. No resp. distress. No  accessory muscle use. HSM. EXTR: No c/c/e NEURO Normal gait.  PSYCH: Normally interactive. Conversant. Not depressed or anxious appearing.  Calm demeanor.    Assessment and Plan: Essential hypertension - Plan: hydrochlorothiazide (HYDRODIURIL) 25 MG tablet, Basic metabolic panel  Cough - Plan: albuterol (PROVENTIL HFA;VENTOLIN HFA) 108 (90 BASE) MCG/ACT inhaler  Meds ordered this encounter  Medications  . hydrochlorothiazide (HYDRODIURIL) 25 MG tablet    Sig: Take 1 tablet (25 mg total) by mouth daily.    Dispense:  90 tablet    Refill:  3  . albuterol (PROVENTIL HFA;VENTOLIN HFA) 108 (90 BASE) MCG/ACT inhaler    Sig: Inhale 2 puffs into the lungs every 4 (four) hours as needed for wheezing (cough, shortness of breath or wheezing.).    Dispense:  1 Inhaler    Refill:  5   Encouraged to stick with regular HCTZ use.  She will send me a  mychart message with a BP update. congratulated on her weight loss.   Will plan further follow- up pending labs.   Signed Megan Blinks, MD

## 2014-05-17 NOTE — Patient Instructions (Signed)
Check your BP every week or two and write it down- then please send me an email with a list of your recent BP readings.  For the time being continue to take the HCTZ.  I will be in touch with your labs [place] job with diet and exercise!!  Wt Readings from Last 3 Encounters:  05/17/14 174 lb 9.6 oz (79.198 kg)  11/12/13 179 lb (81.194 kg)  10/17/13 182 lb (82.555 kg)

## 2014-05-18 ENCOUNTER — Encounter: Payer: Self-pay | Admitting: Family Medicine

## 2014-06-25 ENCOUNTER — Ambulatory Visit (INDEPENDENT_AMBULATORY_CARE_PROVIDER_SITE_OTHER): Payer: BC Managed Care – PPO | Admitting: Anesthesiology

## 2014-06-25 DIAGNOSIS — Z3049 Encounter for surveillance of other contraceptives: Secondary | ICD-10-CM

## 2014-06-25 MED ORDER — MEDROXYPROGESTERONE ACETATE 150 MG/ML IM SUSP
150.0000 mg | Freq: Once | INTRAMUSCULAR | Status: AC
  Administered 2014-06-25: 150 mg via INTRAMUSCULAR

## 2014-09-07 ENCOUNTER — Other Ambulatory Visit: Payer: Self-pay | Admitting: Gynecology

## 2014-09-10 ENCOUNTER — Ambulatory Visit (INDEPENDENT_AMBULATORY_CARE_PROVIDER_SITE_OTHER): Payer: BC Managed Care – PPO | Admitting: Anesthesiology

## 2014-09-10 DIAGNOSIS — Z3042 Encounter for surveillance of injectable contraceptive: Secondary | ICD-10-CM

## 2014-09-10 MED ORDER — MEDROXYPROGESTERONE ACETATE 150 MG/ML IM SUSP
150.0000 mg | Freq: Once | INTRAMUSCULAR | Status: AC
  Administered 2014-09-10: 150 mg via INTRAMUSCULAR

## 2014-10-03 ENCOUNTER — Encounter: Payer: Self-pay | Admitting: Urgent Care

## 2014-10-03 ENCOUNTER — Ambulatory Visit (INDEPENDENT_AMBULATORY_CARE_PROVIDER_SITE_OTHER): Payer: BC Managed Care – PPO | Admitting: Family Medicine

## 2014-10-03 ENCOUNTER — Ambulatory Visit (INDEPENDENT_AMBULATORY_CARE_PROVIDER_SITE_OTHER): Payer: BC Managed Care – PPO

## 2014-10-03 VITALS — BP 148/96 | HR 100 | Temp 98.9°F | Resp 16 | Ht 64.0 in | Wt 177.6 lb

## 2014-10-03 DIAGNOSIS — J988 Other specified respiratory disorders: Secondary | ICD-10-CM

## 2014-10-03 DIAGNOSIS — R0602 Shortness of breath: Secondary | ICD-10-CM

## 2014-10-03 DIAGNOSIS — R7309 Other abnormal glucose: Secondary | ICD-10-CM

## 2014-10-03 DIAGNOSIS — I10 Essential (primary) hypertension: Secondary | ICD-10-CM

## 2014-10-03 DIAGNOSIS — R739 Hyperglycemia, unspecified: Secondary | ICD-10-CM

## 2014-10-03 DIAGNOSIS — R0981 Nasal congestion: Secondary | ICD-10-CM

## 2014-10-03 DIAGNOSIS — R509 Fever, unspecified: Secondary | ICD-10-CM

## 2014-10-03 DIAGNOSIS — R062 Wheezing: Secondary | ICD-10-CM

## 2014-10-03 LAB — POCT CBC
GRANULOCYTE PERCENT: 66.8 % (ref 37–80)
HEMATOCRIT: 43.4 % (ref 37.7–47.9)
Hemoglobin: 14 g/dL (ref 12.2–16.2)
Lymph, poc: 1.7 (ref 0.6–3.4)
MCH, POC: 29.5 pg (ref 27–31.2)
MCHC: 32.4 g/dL (ref 31.8–35.4)
MCV: 91.1 fL (ref 80–97)
MID (CBC): 0.2 (ref 0–0.9)
MPV: 7.3 fL (ref 0–99.8)
PLATELET COUNT, POC: 277 10*3/uL (ref 142–424)
POC GRANULOCYTE: 3.9 (ref 2–6.9)
POC LYMPH PERCENT: 29.1 %L (ref 10–50)
POC MID %: 4.1 %M (ref 0–12)
RBC: 4.76 M/uL (ref 4.04–5.48)
RDW, POC: 13.2 %
WBC: 5.8 10*3/uL (ref 4.6–10.2)

## 2014-10-03 LAB — GLUCOSE, POCT (MANUAL RESULT ENTRY): POC GLUCOSE: 92 mg/dL (ref 70–99)

## 2014-10-03 LAB — BASIC METABOLIC PANEL
BUN: 6 mg/dL (ref 6–23)
CHLORIDE: 105 meq/L (ref 96–112)
CO2: 26 mEq/L (ref 19–32)
Calcium: 9.8 mg/dL (ref 8.4–10.5)
Creat: 0.75 mg/dL (ref 0.50–1.10)
Glucose, Bld: 79 mg/dL (ref 70–99)
POTASSIUM: 3.9 meq/L (ref 3.5–5.3)
SODIUM: 140 meq/L (ref 135–145)

## 2014-10-03 MED ORDER — PREDNISONE 20 MG PO TABS: ORAL_TABLET | ORAL | Status: AC

## 2014-10-03 MED ORDER — GUAIFENESIN ER 1200 MG PO TB12: 1.0000 | ORAL_TABLET | Freq: Two times a day (BID) | ORAL | Status: DC | PRN

## 2014-10-03 MED ORDER — IPRATROPIUM BROMIDE 0.03 % NA SOLN: 2.0000 | Freq: Two times a day (BID) | NASAL | Status: DC

## 2014-10-03 MED ORDER — AMLODIPINE BESYLATE 5 MG PO TABS: 5.0000 mg | ORAL_TABLET | Freq: Every day | ORAL | Status: DC

## 2014-10-03 MED ORDER — HYDROCHLOROTHIAZIDE 25 MG PO TABS: 25.0000 mg | ORAL_TABLET | Freq: Every day | ORAL | Status: DC

## 2014-10-03 NOTE — Progress Notes (Signed)
Discussed with PA. X-ray reviewed and was clear. Treatment plan discussed and agreed upon.

## 2014-10-03 NOTE — Progress Notes (Signed)
MRN: [id number] DOB: [birth year]  Subjective:   Megan Lang is a 43 y.o. female presenting for cold symptoms and medication refill.  4 day history of cold symptoms including sinus congestion, rhinorrhea, fevers up to 100F. Also admits sinus headaches, itchy watery eyes, intermittent cough productive with yellow phlegm associated with wheezing, hoarseness, sore throat and difficulty swallowing on Sunday but is now resolved. Patient had to use albuterol inhaler 1-2x for some shob, also tried alka-seltzer plus for colds and ibuprofen for sinus headaches with minimal relief. Denies ear pain, ear drainage, chest pain, chest tightness, abdominal pain, n/v, diarrhea. Admits history of seasonal allergies, takes Zyrtec daily and asthma, has albuterol inhaler prn. Does not smoking, drinks alcohol occasionally.   HTN - diagnosed in 2003, managed with HCTZ, reports 100% compliance, took last dose this morning, needs a medication refill. Denies light-headedness, dizziness, heart racing, palpitations, urinary changes, lower extremity edema. Last eye exam was 10/2013, was prescribed glasses to wear for reading and driving. Checks BP regularly, wide range, has been as high as 152 and as low as 104. 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 Avoids added salt, tries to eat healthy at home, limits restaurant food. Exercises 3x a week, walks and runs on treadmill.   Denies any other aggravating or relieving factors, no other questions or concerns.  Megan Lang has a current medication list which includes the following prescription(s): albuterol, cetirizine hcl, hydrochlorothiazide, medroxyprogesterone, and medroxyprogesterone.  She is allergic to penicillins.  Megan Lang  has a past medical history of Normal spontaneous vaginal delivery; Hypertension; Asthma; and Allergy. Also  has past surgical history that includes Carpal  tunnel release (2009).  ROS As in subjective.  Objective:   Vitals: BP 148/96 mmHg  Pulse 100  Temp(Src) 98.9 F (37.2 C) (Oral)  Resp 16  Ht 5\' 4"  (1.626 m)  Wt 177 lb 9.6 oz (80.559 kg)  BMI 30.47 kg/m2  SpO2 98%  BP Readings from Last 3 Encounters:  10/03/14 148/96  05/17/14 160/86  11/12/13 136/90   Physical Exam  Constitutional: She is oriented to person, place, and time and well-developed, well-nourished, and in no distress.  HENT:  TM's pearly and intact bilaterally, no effusions or erythema. Nasal turbinates inflamed and edematous L>R, mild clear-yellow rhinorrhea but no mucopurlulence. Moderate postnasal drip present, without oropharyngeal exudates or tonsillar abscesses.  Eyes: Conjunctivae and EOM are normal. Right eye exhibits no discharge. Left eye exhibits no discharge. No scleral icterus.  Neck: Normal range of motion. Neck supple.  Cardiovascular: Regular rhythm, normal heart sounds and intact distal pulses.  Exam reveals no gallop and no friction rub.   No murmur heard. Pulmonary/Chest: Effort normal. No stridor. No respiratory distress. She has wheezes (expiratory wheezing mid-lower lobes). She has no rales. She exhibits no tenderness.  Abdominal: Soft. Bowel sounds are normal.  Musculoskeletal: Normal range of motion. She exhibits no edema or tenderness.  Lymphadenopathy:    She has no cervical adenopathy.  Neurological: She is alert and oriented to person, place, and time.  Skin: Skin is warm and dry. No rash noted. She is not diaphoretic. No erythema.  Psychiatric: Mood and affect normal.   Results for orders placed or performed in visit on 10/03/14 (from the past 24 hour(s))  POCT CBC     Status: None   Collection Time: 10/03/14 10:00 AM  Result Value Ref Range  WBC 5.8 4.6 - 10.2 K/uL   Lymph, poc 1.7 0.6 - 3.4   POC LYMPH PERCENT 29.1 10 - 50 %L   MID (cbc) 0.2 0 - 0.9   POC MID % 4.1 0 - 12 %M   POC Granulocyte 3.9 2 - 6.9   Granulocyte  percent 66.8 37 - 80 %G   RBC 4.76 4.04 - 5.48 M/uL   Hemoglobin 14.0 12.2 - 16.2 g/dL   HCT, POC 43.4 37.7 - 47.9 %   MCV 91.1 80 - 97 fL   MCH, POC 29.5 27 - 31.2 pg   MCHC 32.4 31.8 - 35.4 g/dL   RDW, POC 13.2 %   Platelet Count, POC 277 142 - 424 K/uL   MPV 7.3 0 - 99.8 fL  POCT glucose (manual entry)     Status: None   Collection Time: 10/03/14 10:00 AM  Result Value Ref Range   POC Glucose 92 70 - 99 mg/dl   [facility] reading (PRIMARY) by  Dr. Linna Darner and PA-Alizea Pell. CXR: Scattered hilar granulomas in left lung otherwise no acute osseous findings or active cardiopulmonary process demonstrated.  Dg Chest 2 View  10/03/2014   CLINICAL DATA:  Shortness of breath, wheezing, cough, congestion.  EXAM: CHEST - 2 VIEW  COMPARISON:  10/09/2012  FINDINGS: The heart size and mediastinal contours are within normal limits. There is no evidence of pulmonary edema, consolidation, pneumothorax, nodule or pleural fluid. The visualized skeletal structures are unremarkable.  IMPRESSION: No active disease.   Electronically Signed   By: Aletta Edouard M.D.   On: 10/03/2014 10:48   Assessment and Plan :   1. Respiratory infection - likely viral in etiology, given asthma and allergies, will start short steroid course - return to clinic if symptoms worsen, fail to resolve or as needed  2. Fever, unspecified fever cause 3. Shortness of breath 4. Wheezing - PE, CBC, CXR reassuring for r/o infectious process, tx as above  5. Elevated blood sugar - screening for diabetes given plan for steroid course - POCT glucose (manual entry) was normal  6. Nasal congestion - Guaifenesin (MUCINEX MAXIMUM STRENGTH) 1200 MG TB12; Take 1 tablet (1,200 mg total) by mouth every 12 (twelve) hours as needed.  Dispense: 30 tablet; Refill: 1  7. Essential hypertension - not controlled, given dietary and exercise compliance, will add second BP medication amlodipine, continue HCT, follow up in 1 month - Basic metabolic panel  pending   Megan Eagles, PA-C [facility] [phone] 10/03/2014 10:10 AM

## 2014-10-03 NOTE — Patient Instructions (Signed)
-   Schedule your albuterol inhaler to 3 times a day. - Take 2 tablets of Prednisone 20mg  for 3 days.

## 2014-10-23 ENCOUNTER — Telehealth: Payer: Self-pay

## 2014-10-23 NOTE — Telephone Encounter (Signed)
Nevermind-I see letter was sent for labs. Please review just the xray. Thanks

## 2014-10-23 NOTE — Telephone Encounter (Signed)
X-ray for chest in December and results.    [phone]

## 2014-10-23 NOTE — Telephone Encounter (Signed)
Freida Busman, please review labs and xray. Thanks

## 2014-11-13 ENCOUNTER — Encounter: Payer: BC Managed Care – PPO | Admitting: Gynecology

## 2014-11-14 ENCOUNTER — Encounter: Payer: Self-pay | Admitting: Gynecology

## 2014-11-14 ENCOUNTER — Ambulatory Visit (INDEPENDENT_AMBULATORY_CARE_PROVIDER_SITE_OTHER): Payer: BC Managed Care – PPO | Admitting: Gynecology

## 2014-11-14 ENCOUNTER — Other Ambulatory Visit (HOSPITAL_COMMUNITY)
Admission: RE | Admit: 2014-11-14 | Discharge: 2014-11-14 | Disposition: A | Discharge status: Home / Self Care | Payer: BC Managed Care – PPO | Source: Ambulatory Visit | Attending: Gynecology | Admitting: Gynecology

## 2014-11-14 VITALS — BP 130/86 | Ht 63.0 in | Wt 186.0 lb

## 2014-11-14 DIAGNOSIS — Z01419 Encounter for gynecological examination (general) (routine) without abnormal findings: Secondary | ICD-10-CM | POA: Diagnosis present

## 2014-11-14 DIAGNOSIS — N898 Other specified noninflammatory disorders of vagina: Secondary | ICD-10-CM

## 2014-11-14 DIAGNOSIS — Z1151 Encounter for screening for human papillomavirus (HPV): Secondary | ICD-10-CM | POA: Insufficient documentation

## 2014-11-14 DIAGNOSIS — Z23 Encounter for immunization: Secondary | ICD-10-CM

## 2014-11-14 DIAGNOSIS — Z800 Family history of malignant neoplasm of digestive organs: Secondary | ICD-10-CM

## 2014-11-14 LAB — COMPREHENSIVE METABOLIC PANEL
ALBUMIN: 4.4 g/dL (ref 3.5–5.2)
ALT: 20 U/L (ref 0–35)
AST: 17 U/L (ref 0–37)
Alkaline Phosphatase: 67 U/L (ref 39–117)
BUN: 10 mg/dL (ref 6–23)
CO2: 26 mEq/L (ref 19–32)
CREATININE: 0.78 mg/dL (ref 0.50–1.10)
Calcium: 9.5 mg/dL (ref 8.4–10.5)
Chloride: 105 mEq/L (ref 96–112)
Glucose, Bld: 82 mg/dL (ref 70–99)
POTASSIUM: 4.3 meq/L (ref 3.5–5.3)
Sodium: 139 mEq/L (ref 135–145)
Total Bilirubin: 0.9 mg/dL (ref 0.2–1.2)
Total Protein: 7.4 g/dL (ref 6.0–8.3)

## 2014-11-14 LAB — LIPID PANEL
CHOLESTEROL: 140 mg/dL (ref 0–200)
HDL: 49 mg/dL (ref >39)
LDL Cholesterol: 77 mg/dL (ref 0–99)
Total CHOL/HDL Ratio: 2.9 Ratio
Triglycerides: 69 mg/dL (ref <150)
VLDL: 14 mg/dL (ref 0–40)

## 2014-11-14 LAB — WET PREP FOR TRICH, YEAST, CLUE
CLUE CELLS WET PREP: NONE SEEN
Trich, Wet Prep: NONE SEEN

## 2014-11-14 LAB — TSH: TSH: 0.696 u[IU]/mL (ref 0.350–4.500)

## 2014-11-14 MED ORDER — FLUCONAZOLE 150 MG PO TABS: 150.0000 mg | ORAL_TABLET | Freq: Once | ORAL | Status: DC

## 2014-11-14 MED ORDER — METRONIDAZOLE 500 MG PO TABS: 500.0000 mg | ORAL_TABLET | Freq: Two times a day (BID) | ORAL | Status: DC

## 2014-11-14 NOTE — Patient Instructions (Addendum)
Colonoscopy A colonoscopy is an exam to look at the entire large intestine (colon). This exam can help find problems such as tumors, polyps, inflammation, and areas of bleeding. The exam takes about 1 hour.  LET [facility] CARE PROVIDER KNOW ABOUT:   Any allergies you have.  All medicines you are taking, including vitamins, herbs, eye drops, creams, and over-the-counter medicines.  Previous problems you or members of your family have had with the use of anesthetics.  Any blood disorders you have.  Previous surgeries you have had.  Medical conditions you have. RISKS AND COMPLICATIONS  Generally, this is a safe procedure. However, as with any procedure, complications can occur. Possible complications include:  Bleeding.  Tearing or rupture of the colon wall.  Reaction to medicines given during the exam.  Infection (rare). BEFORE THE PROCEDURE   Ask your health care provider about changing or stopping your regular medicines.  You may be prescribed an oral bowel prep. This involves drinking a large amount of medicated liquid, starting the day before your procedure. The liquid will cause you to have multiple loose stools until your stool is almost clear or light green. This cleans out your colon in preparation for the procedure.  Do not eat or drink anything else once you have started the bowel prep, unless your health care provider tells you it is safe to do so.  Arrange for someone to drive you home after the procedure. PROCEDURE   You will be given medicine to help you relax (sedative).  You will lie on your side with your knees bent.  A long, flexible tube with a light and camera on the end (colonoscope) will be inserted through the rectum and into the colon. The camera sends video back to a computer screen as it moves through the colon. The colonoscope also releases carbon dioxide gas to inflate the colon. This helps your health care provider see the area better.  During  the exam, your health care provider may take a small tissue sample (biopsy) to be examined under a microscope if any abnormalities are found.  The exam is finished when the entire colon has been viewed. AFTER THE PROCEDURE   Do not drive for 24 hours after the exam.  You may have a small amount of blood in your stool.  You may pass moderate amounts of gas and have mild abdominal cramping or bloating. This is caused by the gas used to inflate your colon during the exam.  Ask when your test results will be ready and how you will get your results. Make sure you get your test results. Document Released: 10/01/2000 Document Revised: 07/25/2013 Document Reviewed: 06/11/2013 [facility] Patient Information 2015 [place], [place]. This information is not intended to replace advice given to you by your health care provider. Make sure you discuss any questions you have with your health care provider. Influenza Virus Vaccine injection (Fluarix) What is this medicine? INFLUENZA VIRUS VACCINE (in floo EN zuh VAHY ruhs vak SEEN) helps to reduce the risk of getting influenza also known as the flu. This medicine may be used for other purposes; ask your health care provider or pharmacist if you have questions. COMMON BRAND NAME(S): Fluarix, Fluzone What should I tell my health care provider before I take this medicine? They need to know if you have any of these conditions: -bleeding disorder like hemophilia -fever or infection -Guillain-Barre syndrome or other neurological problems -immune system problems -infection with the human immunodeficiency virus (HIV) or AIDS -low  blood platelet counts -multiple sclerosis -an unusual or allergic reaction to influenza virus vaccine, eggs, chicken proteins, latex, gentamicin, other medicines, foods, dyes or preservatives -pregnant or trying to get pregnant -breast-feeding How should I use this medicine? This vaccine is for injection into a muscle. It is given by a  health care professional. A copy of Vaccine Information Statements will be given before each vaccination. Read this sheet carefully each time. The sheet may change frequently. Talk to your pediatrician regarding the use of this medicine in children. Special care may be needed. Overdosage: If you think you have taken too much of this medicine contact a poison control center or emergency room at once. NOTE: This medicine is only for you. Do not share this medicine with others. What if I miss a dose? This does not apply. What may interact with this medicine? -chemotherapy or radiation therapy -medicines that lower your immune system like etanercept, anakinra, infliximab, and adalimumab -medicines that treat or prevent blood clots like warfarin -phenytoin -steroid medicines like prednisone or cortisone -theophylline -vaccines This list may not describe all possible interactions. Give your health care provider a list of all the medicines, herbs, non-prescription drugs, or dietary supplements you use. Also tell them if you smoke, drink alcohol, or use illegal drugs. Some items may interact with your medicine. What should I watch for while using this medicine? Report any side effects that do not go away within 3 days to your doctor or health care professional. Call your health care provider if any unusual symptoms occur within 6 weeks of receiving this vaccine. You may still catch the flu, but the illness is not usually as bad. You cannot get the flu from the vaccine. The vaccine will not protect against colds or other illnesses that may cause fever. The vaccine is needed every year. What side effects may I notice from receiving this medicine? Side effects that you should report to your doctor or health care professional as soon as possible: -allergic reactions like skin rash, itching or hives, swelling of the face, lips, or tongue Side effects that usually do not require medical attention (report to  your doctor or health care professional if they continue or are bothersome): -fever -headache -muscle aches and pains -pain, tenderness, redness, or swelling at site where injected -weak or tired This list may not describe all possible side effects. Call your doctor for medical advice about side effects. You may report side effects to FDA at 1-800-FDA-1088. Where should I keep my medicine? This vaccine is only given in a clinic, pharmacy, doctor's office, or other health care setting and will not be stored at home. NOTE: This sheet is a summary. It may not cover all possible information. If you have questions about this medicine, talk to your doctor, pharmacist, or health care provider.  2015, [employer]vier/Gold Standard. (2008-05-01 09:30:40)

## 2014-11-14 NOTE — Progress Notes (Signed)
Megan Lang 1971-05-11 [id number]   History:    44 y.o.  for annual gyn exam without any complaints. Patient currently on Depo-Provera 150 mg every 3 months. Her next injection is due February 24. Dr. Edilia Bo has been following the patient for her hypertension. Patient denies any past history of any abnormal Pap smear. Patient requesting flu vaccine today.  Patient has informed me that her father was diagnosed with colon cancer in his late 52s.  Past medical history,surgical history, family history and social history were all reviewed and documented in the EPIC chart.  Gynecologic History No LMP recorded. Patient has had an injection. Contraception: Depo-Provera injections Last Pap: 2013. Results were: normal Last mammogram: March 2015. Results were: normal  Obstetric History OB History  Gravida Para Term Preterm AB SAB TAB Ectopic Multiple Living  3 3 3       3     # Outcome Date GA Lbr Len/2nd Weight Sex Delivery Anes PTL Lv  3 Term     F Vag-Spont  N Y  2 Term     F Vag-Spont  N Y  1 Term     F Vag-Spont  N Y       ROS: A ROS was performed and pertinent positives and negatives are included in the history.  GENERAL: No fevers or chills. HEENT: No change in vision, no earache, sore throat or sinus congestion. NECK: No pain or stiffness. CARDIOVASCULAR: No chest pain or pressure. No palpitations. PULMONARY: No shortness of breath, cough or wheeze. GASTROINTESTINAL: No abdominal pain, nausea, vomiting or diarrhea, melena or bright red blood per rectum. GENITOURINARY: No urinary frequency, urgency, hesitancy or dysuria. MUSCULOSKELETAL: No joint or muscle pain, no back pain, no recent trauma. DERMATOLOGIC: No rash, no itching, no lesions. ENDOCRINE: No polyuria, polydipsia, no heat or cold intolerance. No recent change in weight. HEMATOLOGICAL: No anemia or easy bruising or bleeding. NEUROLOGIC: No headache, seizures, numbness, tingling or weakness. PSYCHIATRIC: No depression, no  loss of interest in normal activity or change in sleep pattern.     Exam: chaperone present  BP 130/86 mmHg  Ht 5\' 3"  (1.6 m)  Wt 186 lb (84.369 kg)  BMI 32.96 kg/m2  Body mass index is 32.96 kg/(m^2).  General appearance : Well developed well nourished female. No acute distress HEENT: Neck supple, trachea midline, no carotid bruits, no thyroidmegaly Lungs: Clear to auscultation, no rhonchi or wheezes, or rib retractions  Heart: Regular rate and rhythm, no murmurs or gallops Breast:Examined in sitting and supine position were symmetrical in appearance, no palpable masses or tenderness,  no skin retraction, no nipple inversion, no nipple discharge, no skin discoloration, no axillary or supraclavicular lymphadenopathy Abdomen: no palpable masses or tenderness, no rebound or guarding Extremities: no edema or skin discoloration or tenderness  Pelvic:  Bartholin, Urethra, Skene Glands: Within normal limits             Vagina: Creamy brown discharge  Cervix: No gross lesions or discharge  Uterus  anteverted, normal size, shape and consistency, non-tender and mobile  Adnexa  Without masses or tenderness  Anus and perineum  normal   Rectovaginal  normal sphincter tone without palpated masses or tenderness             Hemoccult not indicated    wet prep: Few yeast, too numerous to count WBC, moderate bacteria  GC and Chlamydia culture pending  Assessment/Plan:  44 y.o. female for annual exam with apparent yeast and BV.  Patient will be prescribed Flagyl 500 mg one by mouth twice a day for 5 days and 1 Diflucan 150 mg by mouth. A GC and Chlamydia cultures pending at time of this dictation. Patient was reminded that her next the following injection is due in February 24. We also discussed importance of calcium and vitamin D and weightbearing exercises for osteoporosis prevention. She was also provided with names of gastroenterologist in our community to schedule an appointment to discuss  early screening with colonoscopy due to the fact that her father was diagnosed with colon cancer in his late 50s. Pap smear was done today. A TSH, competence metabolic panel, urinalysis and fasting lipid profile was obtained today. Patient received the flu vaccine today. Pap smear was done today.   Terrance Mass MD, 10:04 AM 11/14/2014

## 2014-11-14 NOTE — Addendum Note (Signed)
Addended by: Thurnell Garbe A on: 11/14/2014 10:27 AM   Modules accepted: Orders, SmartSet

## 2014-11-15 LAB — URINALYSIS W MICROSCOPIC + REFLEX CULTURE
BILIRUBIN URINE: NEGATIVE
Bacteria, UA: NONE SEEN
CASTS: NONE SEEN
Crystals: NONE SEEN
Glucose, UA: NEGATIVE mg/dL
Hgb urine dipstick: NEGATIVE
KETONES UR: NEGATIVE mg/dL
Leukocytes, UA: NEGATIVE
NITRITE: NEGATIVE
PROTEIN: NEGATIVE mg/dL
SQUAMOUS EPITHELIAL / LPF: NONE SEEN
Specific Gravity, Urine: 1.014 (ref 1.005–1.030)
Urobilinogen, UA: 0.2 mg/dL (ref 0.0–1.0)
pH: 6.5 (ref 5.0–8.0)

## 2014-11-15 LAB — CYTOLOGY - PAP

## 2014-11-15 LAB — GC/CHLAMYDIA PROBE AMP
CT Probe RNA: NEGATIVE
GC Probe RNA: NEGATIVE

## 2014-11-18 ENCOUNTER — Other Ambulatory Visit: Payer: Self-pay

## 2014-11-18 DIAGNOSIS — Z1231 Encounter for screening mammogram for malignant neoplasm of breast: Secondary | ICD-10-CM

## 2014-12-11 ENCOUNTER — Other Ambulatory Visit: Payer: BC Managed Care – PPO

## 2014-12-12 ENCOUNTER — Other Ambulatory Visit (INDEPENDENT_AMBULATORY_CARE_PROVIDER_SITE_OTHER): Payer: BC Managed Care – PPO | Admitting: Anesthesiology

## 2014-12-12 DIAGNOSIS — Z3042 Encounter for surveillance of injectable contraceptive: Secondary | ICD-10-CM

## 2014-12-12 DIAGNOSIS — N912 Amenorrhea, unspecified: Secondary | ICD-10-CM

## 2014-12-12 LAB — PREGNANCY, URINE: Preg Test, Ur: NEGATIVE

## 2014-12-12 MED ORDER — MEDROXYPROGESTERONE ACETATE 150 MG/ML IM SUSP
150.0000 mg | Freq: Once | INTRAMUSCULAR | Status: AC
  Administered 2014-12-12: 150 mg via INTRAMUSCULAR

## 2014-12-12 NOTE — Addendum Note (Signed)
Addended by: Thurnell Garbe A on: 12/12/2014 10:51 AM   Modules accepted: Orders

## 2014-12-27 ENCOUNTER — Ambulatory Visit
Admission: RE | Admit: 2014-12-27 | Discharge: 2014-12-27 | Disposition: A | Discharge status: Home / Self Care | Payer: BC Managed Care – PPO | Source: Ambulatory Visit

## 2014-12-27 DIAGNOSIS — Z1231 Encounter for screening mammogram for malignant neoplasm of breast: Secondary | ICD-10-CM

## 2015-01-25 ENCOUNTER — Ambulatory Visit (INDEPENDENT_AMBULATORY_CARE_PROVIDER_SITE_OTHER): Payer: BC Managed Care – PPO | Admitting: Physician Assistant

## 2015-01-25 VITALS — BP 144/92 | HR 92 | Temp 98.7°F | Resp 18 | Ht 63.0 in | Wt 178.0 lb

## 2015-01-25 DIAGNOSIS — Z8619 Personal history of other infectious and parasitic diseases: Secondary | ICD-10-CM

## 2015-01-25 DIAGNOSIS — M62838 Other muscle spasm: Secondary | ICD-10-CM | POA: Diagnosis not present

## 2015-01-25 DIAGNOSIS — G629 Polyneuropathy, unspecified: Secondary | ICD-10-CM

## 2015-01-25 MED ORDER — AMITRIPTYLINE HCL 10 MG PO TABS: 10.0000 mg | ORAL_TABLET | Freq: Every day | ORAL | Status: DC

## 2015-01-25 MED ORDER — BACLOFEN 10 MG PO TABS: 10.0000 mg | ORAL_TABLET | Freq: Three times a day (TID) | ORAL | Status: DC

## 2015-01-25 NOTE — Patient Instructions (Signed)
You have a muscle spasm along your spine on the left side. Please take the baclofen up to three times per day as needed for the spasm. Doing this along with heat, massage, and light range of motion should help with the spasm. Since you have tingling shooting pains along the left side, please take the amitriptyline once day at night. Hopefully this helps to relieve the nerve pains. I've sent in 30 pills with 1 refill. Once the pain has resolved and you're planning to stop the medication please take 1/2 pill (5 mg) for the final week of medication.  Please let us know if these measures don't help or if the nerve pain returns.

## 2015-01-25 NOTE — Progress Notes (Signed)
Subjective:    Patient ID: Megan Lang, female    DOB: 1970-11-30, 43 y.o.   MRN: [id number]  Chief Complaint  Patient presents with  . Back Pain     x 2 weeks  . Rib Pain    left side   Patient Active Problem List   Diagnosis Date Noted  . Family history of colon cancer 10/27/2011  . HTN (hypertension) 10/27/2011   Medications, allergies, past medical history, surgical history, family history, social history and problem list reviewed and updated.  HPI  22 yof with pmh htn and h/o left thoracic shingles 3 yrs ago presents with left back/rib pain.   Having intermittent left sided sharp shooting pains past 2 wks. Originate near left shoulder blade and shoot around ribs to left chest wall. Lasts mins at a time. Complete resolution btwn attacks. No aggravating factors. 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 Denies exertional cp, sob, presyncope, syncope, palps. Pain not assoc with meals. Denies abd pain, n/v, diarrhea. Denies pleurisy. Denies trauma.   States she had shingles in this area 3 yrs ago. Pain persisted for short amnt of time after the event. Vicodin didn't help. Tramadol didn't help. Got relief with baclofen and amitriptyline.   BP mildly elevated today, typically takes norvasc and hctz. Admits to missing norvasc last few days.   Review of Systems See HPI.     Objective:   Physical Exam  Constitutional: She appears well-developed and well-nourished.  Non-toxic appearance. She does not have a sickly appearance. She does not appear ill. No distress.  BP 144/92 mmHg  Pulse 92  Temp(Src) 98.7 F (37.1 C)  Resp 18  Ht 5\' 3"  (1.6 m)  Wt 178 lb (80.74 kg)  BMI 31.54 kg/m2  SpO2 98%  In obvious pain sitting on edge of bed. Moving up and down for exam very gingerly.    Cardiovascular: Normal rate, regular rhythm and normal heart sounds.  Exam reveals no gallop.   No murmur heard. Pulmonary/Chest: Effort normal and breath  sounds normal. No tachypnea. She has no decreased breath sounds. She has no wheezes. She has no rhonchi. She has no rales.  Abdominal: Soft. Normal appearance and bowel sounds are normal. There is no hepatosplenomegaly. There is no tenderness. There is no CVA tenderness and negative Murphy's sign.  Musculoskeletal:       Arms: TTP across left thoracic area from shoulder blade to under left nipple line. Approx 8/10 with palpation. No palpable rib defect. Small muscle spasm noted along left inferior scapular border.   Skin: No rash noted.      Assessment & Plan:   27 yof with pmh htn and h/o left thoracic shingles 3 yrs ago presents with left back/rib pain.   Muscle spasm - Plan: baclofen (LIORESAL) 10 MG tablet --baclofen, heat, massage, light rom once feeling better --could be from overuse, poor posture with recent return to work, proper posture reviewed  Neuropathy - Plan: amitriptyline (ELAVIL) 10 MG tablet History of shingles --sharp, shooting pains along T5/T6 dermatome --likely from irritation from returning to work, possibly from muscle spasm as above --amitriptyline --> 60 day supply, pt instructed to titrate dose to 1/2 pill before stopping  --doubt post herpetic neuralgia as pt has been pain free for almost 3 yrs since last shingles eruption --doubt new shingles outbreak as pains have been intermittent for 2 wks now with no rash --rtc if  no relief or if rash develops  Megan Gutting, PA-C Physician Assistant-Certified [facility]  01/26/2015 10:36 AM

## 2015-01-26 ENCOUNTER — Encounter: Payer: Self-pay | Admitting: Physician Assistant

## 2015-01-26 DIAGNOSIS — Z8619 Personal history of other infectious and parasitic diseases: Secondary | ICD-10-CM | POA: Insufficient documentation

## 2015-02-25 ENCOUNTER — Ambulatory Visit (INDEPENDENT_AMBULATORY_CARE_PROVIDER_SITE_OTHER): Payer: BC Managed Care – PPO | Admitting: Physician Assistant

## 2015-02-25 VITALS — BP 138/90 | HR 85 | Temp 97.9°F | Resp 17 | Ht 63.0 in | Wt 174.4 lb

## 2015-02-25 DIAGNOSIS — R0989 Other specified symptoms and signs involving the circulatory and respiratory systems: Secondary | ICD-10-CM

## 2015-02-25 DIAGNOSIS — R0981 Nasal congestion: Secondary | ICD-10-CM | POA: Diagnosis not present

## 2015-02-25 DIAGNOSIS — R05 Cough: Secondary | ICD-10-CM

## 2015-02-25 DIAGNOSIS — R059 Cough, unspecified: Secondary | ICD-10-CM

## 2015-02-25 LAB — POCT CBC
GRANULOCYTE PERCENT: 59.8 % (ref 37–80)
HCT, POC: 41.2 % (ref 37.7–47.9)
Hemoglobin: 13.3 g/dL (ref 12.2–16.2)
Lymph, poc: 1.9 (ref 0.6–3.4)
MCH: 28.3 pg (ref 27–31.2)
MCHC: 32.4 g/dL (ref 31.8–35.4)
MCV: 87.4 fL (ref 80–97)
MID (CBC): 0.2 (ref 0–0.9)
MPV: 7.3 fL (ref 0–99.8)
PLATELET COUNT, POC: 317 10*3/uL (ref 142–424)
POC Granulocyte: 3.2 (ref 2–6.9)
POC LYMPH PERCENT: 36.3 %L (ref 10–50)
POC MID %: 3.9 % (ref 0–12)
RBC: 4.71 M/uL (ref 4.04–5.48)
RDW, POC: 14.6 %
WBC: 5.3 10*3/uL (ref 4.6–10.2)

## 2015-02-25 MED ORDER — GUAIFENESIN ER 1200 MG PO TB12: 1.0000 | ORAL_TABLET | Freq: Two times a day (BID) | ORAL | Status: DC | PRN

## 2015-02-25 MED ORDER — BENZONATATE 100 MG PO CAPS: 100.0000 mg | ORAL_CAPSULE | Freq: Three times a day (TID) | ORAL | Status: DC | PRN

## 2015-02-25 MED ORDER — HYDROCOD POLST-CPM POLST ER 10-8 MG/5ML PO SUER: 5.0000 mL | Freq: Every evening | ORAL | Status: AC | PRN

## 2015-02-25 NOTE — Progress Notes (Signed)
[facility] [address], [place] [address] [phone]  Date:  02/25/2015   Name:  Megan Lang   DOB:  07/05/1971   MRN:  [id number]  PCP:  Lamar Blinks, MD    Chief Complaint: Nasal Congestion; Cough; Chest Pain; and Back Pain   History of Present Illness:  Megan Lang is a 44 y.o. very pleasant female patient who presents with the following:  She is here for 5 days of nasal congestion, cough and chest tightness.  She states that the cough is keeping her up at night.  Coughing is worse at night, and the cough will is productive in the mornings only of green, and brown sputum.  She states that she has had 2 nasal bleeds.  No rhinorrhea, but nasal congestion.  When she blows her nose, the mucus is slightly thickened.  But she has no nasal pressure.  She denies fever, chills, or fatigue.    Patient Active Problem List   Diagnosis Date Noted  . History of shingles 01/26/2015  . Family history of colon cancer 10/27/2011  . HTN (hypertension) 10/27/2011    Past Medical History  Diagnosis Date  . Normal spontaneous vaginal delivery     3  . Hypertension   . Asthma   . Allergy     Past Surgical History  Procedure Laterality Date  . Carpal tunnel release  2009  . Carpal tunnel release Left     History  Substance Use Topics  . Smoking status: Never Smoker   . Smokeless tobacco: Never Used  . Alcohol Use: 0.0 oz/week    0 Standard drinks or equivalent per week     Comment: OCC- WINE    Family History  Problem Relation Age of Onset  . Hypertension Mother   . Cancer Father     COLON  . Breast cancer Maternal Grandmother   . Hypertension Sister     Allergies  Allergen Reactions  . Penicillins     Medication list has been reviewed and updated.  Current Outpatient Prescriptions on File Prior to Visit  Medication Sig Dispense Refill  . albuterol (PROVENTIL HFA;VENTOLIN HFA) 108 (90 BASE) MCG/ACT inhaler Inhale 2 puffs into the  lungs every 4 (four) hours as needed for wheezing (cough, shortness of breath or wheezing.). 1 Inhaler 5  . amitriptyline (ELAVIL) 10 MG tablet Take 1 tablet (10 mg total) by mouth at bedtime. 30 tablet 1  . baclofen (LIORESAL) 10 MG tablet Take 1 tablet (10 mg total) by mouth 3 (three) times daily. 30 each 0  . Cetirizine HCl (ZYRTEC ALLERGY PO) Take by mouth.      . hydrochlorothiazide (HYDRODIURIL) 25 MG tablet Take 1 tablet (25 mg total) by mouth daily. 90 tablet 3  . ipratropium (ATROVENT) 0.03 % nasal spray Place 2 sprays into both nostrils 2 (two) times daily. 30 mL 0  . medroxyPROGESTERone (DEPO-PROVERA) 150 MG/ML injection INJECT INTRAMUSCLARLLY AS DIRECTED 1 mL 3  . amLODipine (NORVASC) 5 MG tablet Take 1 tablet (5 mg total) by mouth daily. (Patient not taking: Reported on 01/25/2015) 30 tablet 1  . Guaifenesin (MUCINEX MAXIMUM STRENGTH) 1200 MG TB12 Take 1 tablet (1,200 mg total) by mouth every 12 (twelve) hours as needed. (Patient not taking: Reported on 02/25/2015) 30 tablet 1   No current facility-administered medications on file prior to visit.    Review of Systems: ROS otherwise unremarkable unless listed above.    Physical Examination: Filed Vitals:  02/25/15 1005  BP: 138/90  Pulse: 85  Temp: 97.9 F (36.6 C)  Resp: 17   Filed Vitals:   02/25/15 1005  Height: 5\' 3"  (1.6 m)  Weight: 174 lb 6.4 oz (79.107 kg)   Body mass index is 30.9 kg/(m^2). Ideal Body Weight: Weight in (lb) to have BMI = 25: 140.8 Physical Exam  Constitutional: She is oriented to person, place, and time. She appears well-nourished.  HENT:  Head: Normocephalic and atraumatic.  Right Ear: Tympanic membrane, external ear and ear canal normal.  Left Ear: Tympanic membrane, external ear and ear canal normal.  Nose: Mucosal edema and rhinorrhea present. Right sinus exhibits no maxillary sinus tenderness and no frontal sinus tenderness. Left sinus exhibits no maxillary sinus tenderness and no frontal  sinus tenderness.  Mouth/Throat: No uvula swelling. No oropharyngeal exudate, posterior oropharyngeal edema or posterior oropharyngeal erythema.  Nose crease along bridge of nose.  Eyes: EOM are normal. Pupils are equal, round, and reactive to light. Right conjunctiva is not injected. Left conjunctiva is not injected. Right eye exhibits normal extraocular motion. Left eye exhibits normal extraocular motion.  Neck: Normal range of motion. Neck supple.  Cardiovascular: Normal rate, regular rhythm and normal heart sounds.  Exam reveals no friction rub.   No murmur heard. Pulmonary/Chest: Effort normal and breath sounds normal. No accessory muscle usage. No respiratory distress. She has no decreased breath sounds. She has no wheezes. She has no rhonchi.  Neurological: She is alert and oriented to person, place, and time. No cranial nerve deficit. Coordination normal.  Skin: Skin is warm and dry.  Psychiatric: She has a normal mood and affect. Her behavior is normal.    Results for orders placed or performed in visit on 02/25/15  POCT CBC  Result Value Ref Range   WBC 5.3 4.6 - 10.2 K/uL   Lymph, poc 1.9 0.6 - 3.4   POC LYMPH PERCENT 36.3 10 - 50 %L   MID (cbc) 0.2 0 - 0.9   POC MID % 3.9 0 - 12 %M   POC Granulocyte 3.2 2 - 6.9   Granulocyte percent 59.8 37 - 80 %G   RBC 4.71 4.04 - 5.48 M/uL   Hemoglobin 13.3 12.2 - 16.2 g/dL   HCT, POC 41.2 37.7 - 47.9 %   MCV 87.4 80 - 97 fL   MCH, POC 28.3 27 - 31.2 pg   MCHC 32.4 31.8 - 35.4 g/dL   RDW, POC 14.6 %   Platelet Count, POC 317 142 - 424 K/uL   MPV 7.3 0 - 99.8 fL   Peak flow:400 Predicted: 472, 84%   Assessment and Plan: 44 year old female is here today for chief complaint of nasal congestion, cough, chest pain, and back pain for 5 days.  Lungs circulating air very well.  My suspicion is that this is allergic rhinitis or a upper respiratory infection of viral etiology. Treating her supportively, and advising she return in 5 days if  symptoms worsen.  Advised to irrigate with nasal saline.  Continue using her cetirizine at home.    Congestion of nasal sinus - Plan: Guaifenesin (MUCINEX MAXIMUM STRENGTH) 1200 MG TB12, POCT CBC  Chest congestion - Plan: Guaifenesin (MUCINEX MAXIMUM STRENGTH) 1200 MG TB12, POCT CBC  Cough - Plan: POCT CBC, chlorpheniramine-HYDROcodone (TUSSIONEX PENNKINETIC ER) 10-8 MG/5ML SUER, benzonatate (TESSALON) 100 MG capsule  Ivar Drape, PA-C [facility] 5/12/20169:07 AM

## 2015-02-25 NOTE — Patient Instructions (Addendum)
Please use nasal saline for nose irrigation. Start the mucinex every 12 hours for the next 7 days. Use the ipratroprium for nostril inflammation.   Continue the cetirizine.

## 2015-03-05 ENCOUNTER — Other Ambulatory Visit: Payer: Self-pay | Admitting: Gynecology

## 2015-03-07 ENCOUNTER — Ambulatory Visit (INDEPENDENT_AMBULATORY_CARE_PROVIDER_SITE_OTHER): Payer: BC Managed Care – PPO | Admitting: *Deleted

## 2015-03-07 DIAGNOSIS — Z3049 Encounter for surveillance of other contraceptives: Secondary | ICD-10-CM

## 2015-03-07 DIAGNOSIS — Z3042 Encounter for surveillance of injectable contraceptive: Secondary | ICD-10-CM

## 2015-03-07 MED ORDER — MEDROXYPROGESTERONE ACETATE 150 MG/ML IM SUSP
150.0000 mg | Freq: Once | INTRAMUSCULAR | Status: AC
  Administered 2015-03-07: 150 mg via INTRAMUSCULAR

## 2015-04-03 ENCOUNTER — Telehealth: Payer: Self-pay

## 2015-04-03 NOTE — Telephone Encounter (Signed)
Pt states she is trying to get albuterol inhaler refilled but pharmacy advises needs authorization Pt requesting we call authorization in to [facility] on [address] Please advise pt at [phone]

## 2015-05-16 ENCOUNTER — Ambulatory Visit (INDEPENDENT_AMBULATORY_CARE_PROVIDER_SITE_OTHER): Payer: BC Managed Care – PPO

## 2015-05-16 ENCOUNTER — Ambulatory Visit (INDEPENDENT_AMBULATORY_CARE_PROVIDER_SITE_OTHER): Payer: BC Managed Care – PPO | Admitting: Family Medicine

## 2015-05-16 VITALS — BP 134/88 | HR 95 | Temp 99.3°F | Resp 18 | Ht 63.25 in | Wt 175.6 lb

## 2015-05-16 DIAGNOSIS — J4520 Mild intermittent asthma, uncomplicated: Secondary | ICD-10-CM

## 2015-05-16 DIAGNOSIS — R05 Cough: Secondary | ICD-10-CM | POA: Diagnosis not present

## 2015-05-16 DIAGNOSIS — J309 Allergic rhinitis, unspecified: Secondary | ICD-10-CM | POA: Diagnosis not present

## 2015-05-16 DIAGNOSIS — R591 Generalized enlarged lymph nodes: Secondary | ICD-10-CM

## 2015-05-16 DIAGNOSIS — R599 Enlarged lymph nodes, unspecified: Secondary | ICD-10-CM

## 2015-05-16 DIAGNOSIS — R059 Cough, unspecified: Secondary | ICD-10-CM

## 2015-05-16 DIAGNOSIS — J4521 Mild intermittent asthma with (acute) exacerbation: Secondary | ICD-10-CM | POA: Diagnosis not present

## 2015-05-16 LAB — POCT CBC
Granulocyte percent: 51.3 %G (ref 37–80)
HEMATOCRIT: 41 % (ref 37.7–47.9)
HEMOGLOBIN: 13.7 g/dL (ref 12.2–16.2)
Lymph, poc: 1.9 (ref 0.6–3.4)
MCH: 29.3 pg (ref 27–31.2)
MCHC: 33.5 g/dL (ref 31.8–35.4)
MCV: 87.3 fL (ref 80–97)
MID (CBC): 0.5 (ref 0–0.9)
MPV: 7.4 fL (ref 0–99.8)
POC Granulocyte: 2.5 (ref 2–6.9)
POC LYMPH %: 38.7 % (ref 10–50)
POC MID %: 10 % (ref 0–12)
Platelet Count, POC: 270 10*3/uL (ref 142–424)
RBC: 4.7 M/uL (ref 4.04–5.48)
RDW, POC: 13 %
WBC: 4.8 10*3/uL (ref 4.6–10.2)

## 2015-05-16 MED ORDER — AZITHROMYCIN 250 MG PO TABS: ORAL_TABLET | ORAL | Status: DC

## 2015-05-16 MED ORDER — PREDNISONE 20 MG PO TABS
40.0000 mg | ORAL_TABLET | Freq: Every day | ORAL | Status: DC
Start: 2015-05-16 — End: 2015-06-12

## 2015-05-16 MED ORDER — ALBUTEROL SULFATE (2.5 MG/3ML) 0.083% IN NEBU: 2.5000 mg | INHALATION_SOLUTION | Freq: Once | RESPIRATORY_TRACT | Status: DC

## 2015-05-16 MED ORDER — FLUTICASONE PROPIONATE 50 MCG/ACT NA SUSP: 2.0000 | Freq: Every day | NASAL | Status: DC

## 2015-05-16 NOTE — Patient Instructions (Signed)
Start zpak and prednisone to help with asthmatic bronchitis. Add flonase nasal spray for allergies.  If cough not improving in next week, or worsening - return for recheck. Ok to use albuterol for now, but your need for this should decrease.   If the swollen lymph node does not improve in next 2 weeks - return to recheck.

## 2015-05-16 NOTE — Progress Notes (Addendum)
Subjective:  This chart was scribed for Megan Ray, MD by Thea Alken, ED Scribe. This patient was seen in room 12 and the patient's care was started at 8:25 AM.   Patient ID: Megan Lang, female    DOB: June 16, 1971, 44 y.o.   MRN: [id number]  HPI   Chief Complaint  Patient presents with  . Cough    Since last visit in May. When coughing, pt. feels short of breath. clear mucus   HPI Comments: Megan Lang is a 44 y.o. female who presents to the [facility] complaining of a cough. She was last seen here 5/10 with cough and chest congestion for 5 days. Treated with mucinex and tucinex for suspected viral symptoms. She has hx of asthma with albuterol prn. Pt states cough temporarily had gotten better at the end of may but has returned as an intermittent productive cough with discolored sputum. Initially cough was worse during the night but states as of lately she coughs more during the day. She has been wheezing with cough and uses albuterol 3 times daily, but has not used albuterol this morning. She's also had congestion, rhinorrhea, postnasal drip, chest tightness and adenopathy to right neck. She uses zyrtec for allergies but denies using nasal sprays. She denies fever and chill.   Patient Active Problem List   Diagnosis Date Noted  . History of shingles 01/26/2015  . Family history of colon cancer 10/27/2011  . HTN (hypertension) 10/27/2011   Past Medical History  Diagnosis Date  . Normal spontaneous vaginal delivery     3  . Hypertension   . Asthma   . Allergy    Past Surgical History  Procedure Laterality Date  . Carpal tunnel release  2009  . Carpal tunnel release Left    Allergies  Allergen Reactions  . Penicillins    Prior to Admission medications   Medication Sig Start Date End Date Taking? Authorizing Provider  albuterol (PROVENTIL HFA;VENTOLIN HFA) 108 (90 BASE) MCG/ACT inhaler Inhale 2 puffs into the lungs every 4 (four) hours as  needed for wheezing (cough, shortness of breath or wheezing.). 05/17/14  Yes Gay Filler Copland, MD  amitriptyline (ELAVIL) 10 MG tablet Take 1 tablet (10 mg total) by mouth at bedtime. 01/25/15  Yes Todd McVeigh, PA  baclofen (LIORESAL) 10 MG tablet Take 1 tablet (10 mg total) by mouth 3 (three) times daily. 01/25/15  Yes Todd McVeigh, PA  benzonatate (TESSALON) 100 MG capsule Take 1-2 capsules (100-200 mg total) by mouth 3 (three) times daily as needed for cough. 02/25/15  Yes Dorian Heckle English, PA  Cetirizine HCl (ZYRTEC ALLERGY PO) Take by mouth.     Yes Historical Provider, MD  Guaifenesin (MUCINEX MAXIMUM STRENGTH) 1200 MG TB12 Take 1 tablet (1,200 mg total) by mouth every 12 (twelve) hours as needed. 02/25/15  Yes Stephanie D English, PA  hydrochlorothiazide (HYDRODIURIL) 25 MG tablet Take 1 tablet (25 mg total) by mouth daily. 10/03/14  Yes Jaynee Eagles, PA-C  ipratropium (ATROVENT) 0.03 % nasal spray Place 2 sprays into both nostrils 2 (two) times daily. 10/03/14  Yes Jaynee Eagles, PA-C  medroxyPROGESTERone (DEPO-PROVERA) 150 MG/ML injection INJECT INTRAMUSCULARLY AS DIRECTED 03/05/15  Yes Terrance Mass, MD  amLODipine (NORVASC) 5 MG tablet Take 1 tablet (5 mg total) by mouth daily. Patient not taking: Reported on 01/25/2015 10/03/14   Jaynee Eagles, PA-C  Guaifenesin [facility] MAXIMUM STRENGTH) 1200 MG TB12 Take 1 tablet (1,200 mg total) by mouth  every 12 (twelve) hours as needed. Patient not taking: Reported on 02/25/2015 10/03/14   Jaynee Eagles, PA-C   History   Social History  . Marital Status: Married    Spouse Name: N/A  . Number of Children: N/A  . Years of Education: N/A   Occupational History  . Not on file.   Social History Main Topics  . Smoking status: Never Smoker   . Smokeless tobacco: Never Used  . Alcohol Use: 0.0 oz/week    0 Standard drinks or equivalent per week     Comment: OCC- WINE  . Drug Use: No  . Sexual Activity: No     Comment: 1ST INTERCOURSE- 66, PARTNERS- LESS  THAN 5    Other Topics Concern  . Not on file   Social History Narrative   Review of Systems  Constitutional: Negative for fever and chills.  HENT: Positive for postnasal drip and rhinorrhea. Negative for congestion and sore throat.   Respiratory: Positive for cough, chest tightness and wheezing.   Hematological: Positive for adenopathy.   Objective:   Physical Exam  Constitutional: She is oriented to person, place, and time. She appears well-developed and well-nourished. No distress.  HENT:  Head: Normocephalic and atraumatic.  Right Ear: Hearing, tympanic membrane, external ear and ear canal normal.  Left Ear: Hearing, tympanic membrane, external ear and ear canal normal.  Nose: Nose normal.  Mouth/Throat: Oropharynx is clear and moist. No oropharyngeal exudate.  Eyes: Conjunctivae and EOM are normal. Pupils are equal, round, and reactive to light.  Neck:  Slightly enlarged lymph node above SCM muscle on right neck. Mobile. No other lymph adenopathy. No supraclavicular or axillary. No other appreciable lymph nodes around neck.   Cardiovascular: Normal rate, regular rhythm, normal heart sounds and intact distal pulses.   No murmur heard. Pulmonary/Chest: Effort normal and breath sounds normal. No respiratory distress. She has no wheezes. She has no rhonchi.  Neurological: She is alert and oriented to person, place, and time.  Skin: Skin is warm and dry. No rash noted.  Psychiatric: She has a normal mood and affect. Her behavior is normal.  Vitals reviewed.  Filed Vitals:   05/16/15 0815  BP: 134/88  Pulse: 95  Temp: 99.3 F (37.4 C)  TempSrc: Oral  Resp: 18  Height: 5' 3.25" (1.607 m)  Weight: 175 lb 9.6 oz (79.652 kg)  SpO2: 98%   [facility] reading (PRIMARY) by Dr. Carlota Raspberry. CXR: few increased rll/lll markings without discrete infiltrate.   Results for orders placed or performed in visit on 05/16/15  POCT CBC  Result Value Ref Range   WBC 4.8 4.6 - 10.2 K/uL   Lymph, poc  1.9 0.6 - 3.4   POC LYMPH PERCENT 38.7 10 - 50 %L   MID (cbc) 0.5 0 - 0.9   POC MID % 10.0 0 - 12 %M   POC Granulocyte 2.5 2 - 6.9   Granulocyte percent 51.3 37 - 80 %G   RBC 4.70 4.04 - 5.48 M/uL   Hemoglobin 13.7 12.2 - 16.2 g/dL   HCT, POC 41.0 37.7 - 47.9 %   MCV 87.3 80 - 97 fL   MCH, POC 29.3 27 - 31.2 pg   MCHC 33.5 31.8 - 35.4 g/dL   RDW, POC 13.0 %   Platelet Count, POC 270 142 - 424 K/uL   MPV 7.4 0 - 99.8 fL   Peak flow reading is 440, about 100% of predicted (430), increased to 460 after albuterol neb.  Assessment & Plan:   DEAH OTTAWAY is a 44 y.o. female Cough - Plan: albuterol (PROVENTIL) (2.5 MG/3ML) 0.083% nebulizer solution 2.5 mg, POCT CBC, DG Chest 2 View  Asthma with acute exacerbation, mild intermittent - Plan: albuterol (PROVENTIL) (2.5 MG/3ML) 0.083% nebulizer solution 2.5 mg, azithromycin (ZITHROMAX) 250 MG tablet  Lymphadenopathy of head and neck - Plan: POCT CBC  Asthmatic bronchitis, mild intermittent, uncomplicated - Plan: predniSONE (DELTASONE) 20 MG tablet  Allergic rhinitis, unspecified allergic rhinitis type - Plan: fluticasone (FLONASE) 50 MCG/ACT nasal spray  Suspected multifactorial cough with allergic rhinitis and asthmatic bronchitis.   -zpak #1,  -prednisone 40mg  qd for 5 days - SED.   Albuterol if needed, but if frequent use still required - rtc to discuss cough further as peak flow overall ok today.   -add flonase NS, continue Zyrtec.   -RTC/ER precautions.   R neck LAD  - isolated LN. zpak as above, try to avoid manipulation, and recheck if persists past next 2 weeks. May need ENT eval.   Meds ordered this encounter  Medications  . albuterol (PROVENTIL) (2.5 MG/3ML) 0.083% nebulizer solution 2.5 mg    Sig:   . azithromycin (ZITHROMAX) 250 MG tablet    Sig: Take 2 pills by mouth on day 1, then 1 pill by mouth per day on days 2 through 5.    Dispense:  6 tablet    Refill:  0  . predniSONE (DELTASONE) 20 MG tablet    Sig:  Take 2 tablets (40 mg total) by mouth daily with breakfast.    Dispense:  10 tablet    Refill:  0  . fluticasone (FLONASE) 50 MCG/ACT nasal spray    Sig: Place 2 sprays into both nostrils daily.    Dispense:  16 g    Refill:  6   Patient Instructions  Start zpak and prednisone to help with asthmatic bronchitis. Add flonase nasal spray for allergies.  If cough not improving in next week, or worsening - return for recheck. Ok to use albuterol for now, but your need for this should decrease.   If the swollen lymph node does not improve in next 2 weeks - return to recheck.       I personally performed the services described in this documentation, which was scribed in my presence. The recorded information has been reviewed and considered, and addended by me as needed.

## 2015-06-05 ENCOUNTER — Ambulatory Visit (INDEPENDENT_AMBULATORY_CARE_PROVIDER_SITE_OTHER): Payer: BC Managed Care – PPO | Admitting: *Deleted

## 2015-06-05 DIAGNOSIS — Z3042 Encounter for surveillance of injectable contraceptive: Secondary | ICD-10-CM | POA: Diagnosis not present

## 2015-06-05 MED ORDER — MEDROXYPROGESTERONE ACETATE 150 MG/ML IM SUSP
150.0000 mg | Freq: Once | INTRAMUSCULAR | Status: AC
  Administered 2015-06-05: 150 mg via INTRAMUSCULAR

## 2015-06-12 ENCOUNTER — Ambulatory Visit (INDEPENDENT_AMBULATORY_CARE_PROVIDER_SITE_OTHER): Payer: BC Managed Care – PPO | Admitting: Physician Assistant

## 2015-06-12 VITALS — BP 140/92 | HR 100 | Temp 99.7°F | Resp 16 | Ht 63.25 in | Wt 178.0 lb

## 2015-06-12 DIAGNOSIS — J069 Acute upper respiratory infection, unspecified: Secondary | ICD-10-CM

## 2015-06-12 MED ORDER — BENZONATATE 100 MG PO CAPS: 100.0000 mg | ORAL_CAPSULE | Freq: Three times a day (TID) | ORAL | Status: DC | PRN

## 2015-06-12 NOTE — Progress Notes (Signed)
   Subjective:    Patient ID: Megan Lang, female    DOB: 30-Apr-1971, 44 y.o.   MRN: [id number]  HPI Patient presents for productive cough that has been present for past 4 days and is accompanied with congestion, rhinorrhea, lymphadenopathy, HA and fatigue. Denies fever, N/V, sinus pressure, otalgia, wheezing, SOB, or CP. H/o asthma and allergies. Has not taken any medication to remedy. Med allergy to PCN.   Review of Systems As noted above.    Objective:   Physical Exam  Constitutional: She is oriented to person, place, and time. She appears well-developed and well-nourished. No distress.  Blood pressure 140/92, pulse 100, temperature 99.7 F (37.6 C), temperature source Oral, resp. rate 16, height 5' 3.25" (1.607 m), weight 178 lb (80.74 kg), SpO2 99 %.  HENT:  Head: Normocephalic and atraumatic.  Right Ear: Tympanic membrane, external ear and ear canal normal.  Left Ear: Tympanic membrane, external ear and ear canal normal.  Nose: Rhinorrhea (with mild erythema) present. Right sinus exhibits no maxillary sinus tenderness and no frontal sinus tenderness. Left sinus exhibits no maxillary sinus tenderness and no frontal sinus tenderness.  Mouth/Throat: Uvula is midline, oropharynx is clear and moist and mucous membranes are normal. No oropharyngeal exudate, posterior oropharyngeal edema or posterior oropharyngeal erythema.  Eyes: Conjunctivae are normal. Pupils are equal, round, and reactive to light. Right eye exhibits no discharge. Left eye exhibits no discharge. No scleral icterus.  Neck: Normal range of motion. Neck supple. No thyromegaly present.  Cardiovascular: Normal rate, regular rhythm and normal heart sounds.  Exam reveals no gallop and no friction rub.   No murmur heard. Pulmonary/Chest: Effort normal and breath sounds normal. No respiratory distress. She has no decreased breath sounds. She has no wheezes. She has no rhonchi. She has no rales.  Abdominal: Soft. Bowel sounds  are normal. She exhibits no distension. There is no tenderness. There is no rebound and no guarding.  Lymphadenopathy:    She has cervical adenopathy.  Neurological: She is alert and oriented to person, place, and time.  Skin: Skin is warm and dry. No rash noted. She is not diaphoretic. No erythema.       Assessment & Plan:  1. Acute upper respiratory infection Can take flonase and mucinex she has at home. Increase water intake as well.  - benzonatate (TESSALON) 100 MG capsule; Take 1-2 capsules (100-200 mg total) by mouth 3 (three) times daily as needed for cough.  Dispense: 40 capsule; Refill: 0   Daimion Adamcik PA-C  [facility] 06/12/2015 5:31 PM

## 2015-06-12 NOTE — Patient Instructions (Signed)
Upper Respiratory Infection, Adult An upper respiratory infection (URI) is also sometimes known as the common cold. The upper respiratory tract includes the nose, sinuses, throat, trachea, and bronchi. Bronchi are the airways leading to the lungs. Most people improve within 1 week, but symptoms can last up to 2 weeks. A residual cough may last even longer.  CAUSES Many different viruses can infect the tissues lining the upper respiratory tract. The tissues become irritated and inflamed and often become very moist. Mucus production is also common. A cold is contagious. You can easily spread the virus to others by oral contact. This includes kissing, sharing a glass, coughing, or sneezing. Touching your mouth or nose and then touching a surface, which is then touched by another person, can also spread the virus. SYMPTOMS  Symptoms typically develop 1 to 3 days after you come in contact with a cold virus. Symptoms vary from person to person. They may include:  Runny nose.  Sneezing.  Nasal congestion.  Sinus irritation.  Sore throat.  Loss of voice (laryngitis).  Cough.  Fatigue.  Muscle aches.  Loss of appetite.  Headache.  Low-grade fever. DIAGNOSIS  You might diagnose your own cold based on familiar symptoms, since most people get a cold 2 to 3 times a year. Your caregiver can confirm this based on your exam. Most importantly, your caregiver can check that your symptoms are not due to another disease such as strep throat, sinusitis, pneumonia, asthma, or epiglottitis. Blood tests, throat tests, and X-rays are not necessary to diagnose a common cold, but they may sometimes be helpful in excluding other more serious diseases. Your caregiver will decide if any further tests are required. RISKS AND COMPLICATIONS  You may be at risk for a more severe case of the common cold if you smoke cigarettes, have chronic heart disease (such as heart failure) or lung disease (such as asthma), or if  you have a weakened immune system. The very young and very old are also at risk for more serious infections. Bacterial sinusitis, middle ear infections, and bacterial pneumonia can complicate the common cold. The common cold can worsen asthma and chronic obstructive pulmonary disease (COPD). Sometimes, these complications can require emergency medical care and may be life-threatening. PREVENTION  The best way to protect against getting a cold is to practice good hygiene. Avoid oral or hand contact with people with cold symptoms. Wash your hands often if contact occurs. There is no clear evidence that vitamin C, vitamin E, echinacea, or exercise reduces the chance of developing a cold. However, it is always recommended to get plenty of rest and practice good nutrition. TREATMENT  Treatment is directed at relieving symptoms. There is no cure. Antibiotics are not effective, because the infection is caused by a virus, not by bacteria. Treatment may include:  Increased fluid intake. Sports drinks offer valuable electrolytes, sugars, and fluids.  Breathing heated mist or steam (vaporizer or shower).  Eating chicken soup or other clear broths, and maintaining good nutrition.  Getting plenty of rest.  Using gargles or lozenges for comfort.  Controlling fevers with ibuprofen or acetaminophen as directed by your caregiver.  Increasing usage of your inhaler if you have asthma. Zinc gel and zinc lozenges, taken in the first 24 hours of the common cold, can shorten the duration and lessen the severity of symptoms. Pain medicines may help with fever, muscle aches, and throat pain. A variety of non-prescription medicines are available to treat congestion and runny nose. Your caregiver   can make recommendations and may suggest nasal or lung inhalers for other symptoms.  HOME CARE INSTRUCTIONS   Only take over-the-counter or prescription medicines for pain, discomfort, or fever as directed by your  caregiver.  Use a warm mist humidifier or inhale steam from a shower to increase air moisture. This may keep secretions moist and make it easier to breathe.  Drink enough water and fluids to keep your urine clear or pale yellow.  Rest as needed.  Return to work when your temperature has returned to normal or as your caregiver advises. You may need to stay home longer to avoid infecting others. You can also use a face mask and careful hand washing to prevent spread of the virus. SEEK MEDICAL CARE IF:   After the first few days, you feel you are getting worse rather than better.  You need your caregiver's advice about medicines to control symptoms.  You develop chills, worsening shortness of breath, or brown or red sputum. These may be signs of pneumonia.  You develop yellow or brown nasal discharge or pain in the face, especially when you bend forward. These may be signs of sinusitis.  You develop a fever, swollen neck glands, pain with swallowing, or white areas in the back of your throat. These may be signs of strep throat. SEEK IMMEDIATE MEDICAL CARE IF:   You have a fever.  You develop severe or persistent headache, ear pain, sinus pain, or chest pain.  You develop wheezing, a prolonged cough, cough up blood, or have a change in your usual mucus (if you have chronic lung disease).  You develop sore muscles or a stiff neck. Document Released: 03/30/2001 Document Revised: 12/27/2011 Document Reviewed: 01/09/2014 Exit[employer] Patient Information 2015 [employer]. This information is not intended to replace advice given to you by your health care provider. Make sure you discuss any questions you have with your health care provider.  

## 2015-06-13 ENCOUNTER — Encounter (HOSPITAL_COMMUNITY): Payer: Self-pay | Admitting: *Deleted

## 2015-06-13 ENCOUNTER — Emergency Department (INDEPENDENT_AMBULATORY_CARE_PROVIDER_SITE_OTHER): Payer: BC Managed Care – PPO

## 2015-06-13 ENCOUNTER — Emergency Department (INDEPENDENT_AMBULATORY_CARE_PROVIDER_SITE_OTHER)
Admission: EM | Admit: 2015-06-13 | Discharge: 2015-06-13 | Disposition: A | Discharge status: Home / Self Care | Payer: BC Managed Care – PPO | Source: Home / Self Care | Attending: Family Medicine | Admitting: Family Medicine

## 2015-06-13 DIAGNOSIS — R053 Chronic cough: Secondary | ICD-10-CM

## 2015-06-13 DIAGNOSIS — R05 Cough: Secondary | ICD-10-CM | POA: Diagnosis not present

## 2015-06-13 MED ORDER — METHYLPREDNISOLONE ACETATE 40 MG/ML IJ SUSP
80.0000 mg | Freq: Once | INTRAMUSCULAR | Status: AC
  Administered 2015-06-13: 80 mg via INTRAMUSCULAR

## 2015-06-13 MED ORDER — IPRATROPIUM BROMIDE 0.06 % NA SOLN: 2.0000 | Freq: Four times a day (QID) | NASAL | Status: DC

## 2015-06-13 MED ORDER — METHYLPREDNISOLONE ACETATE 80 MG/ML IJ SUSP
INTRAMUSCULAR | Status: AC
  Filled 2015-06-13: qty 1

## 2015-06-13 NOTE — Discharge Instructions (Signed)
Use medicine as prescribed and see your doctor if further problems.

## 2015-06-13 NOTE — ED Notes (Signed)
Pt  Reports  Symptoms  Of  Cough   Congestion -   Nasal  stuffyness      Symptoms  Not  releived by  Cough  pearles              Pt  Reports    Symptoms  Off  And  On  For  A  Few  Months         Pt  Has  A  History  Of  Asthma  But  She  denys  Using  An inhaler

## 2015-06-13 NOTE — ED Provider Notes (Signed)
CSN: [id number]     Arrival date & time 06/13/15  1623 History   First MD Initiated Contact with Patient 06/13/15 1731     Chief Complaint  Patient presents with  . Cough   (Consider location/radiation/quality/duration/timing/severity/associated sxs/prior Treatment) Patient is a 44 y.o. female presenting with cough. The history is provided by the patient.  Cough Cough characteristics:  Non-productive, dry and hacking Severity:  Mild Onset quality:  Gradual Duration:  12 weeks Progression:  Waxing and waning Chronicity:  New Smoker: no   Context: upper respiratory infection   Relieved by:  None tried Worsened by:  Nothing tried Ineffective treatments:  None tried Associated symptoms: shortness of breath   Associated symptoms: no chest pain, no chills, no fever and no wheezing     Past Medical History  Diagnosis Date  . Normal spontaneous vaginal delivery     3  . Hypertension   . Asthma   . Allergy    Past Surgical History  Procedure Laterality Date  . Carpal tunnel release  2009  . Carpal tunnel release Left    Family History  Problem Relation Age of Onset  . Hypertension Mother   . Cancer Father     COLON  . Breast cancer Maternal Grandmother   . Hypertension Sister    Social History  Substance Use Topics  . Smoking status: Never Smoker   . Smokeless tobacco: Never Used  . Alcohol Use: 0.0 oz/week    0 Standard drinks or equivalent per week     Comment: OCC- WINE   OB History    Gravida Para Term Preterm AB TAB SAB Ectopic Multiple Living   3 3 3       3      Review of Systems  Constitutional: Negative for fever and chills.  Respiratory: Positive for cough and shortness of breath. Negative for wheezing.   Cardiovascular: Negative for chest pain.  Gastrointestinal: Negative.   Hematological: Positive for adenopathy.    Allergies  Penicillins  Home Medications   Prior to Admission medications   Medication Sig Start Date End Date Taking?  Authorizing Provider  albuterol (PROVENTIL HFA;VENTOLIN HFA) 108 (90 BASE) MCG/ACT inhaler Inhale 2 puffs into the lungs every 4 (four) hours as needed for wheezing (cough, shortness of breath or wheezing.). 05/17/14   Gay Filler Copland, MD  amitriptyline (ELAVIL) 10 MG tablet Take 1 tablet (10 mg total) by mouth at bedtime. 01/25/15   Araceli Bouche, PA  baclofen (LIORESAL) 10 MG tablet Take 1 tablet (10 mg total) by mouth 3 (three) times daily. Patient not taking: Reported on 06/12/2015 01/25/15   Araceli Bouche, PA  benzonatate (TESSALON) 100 MG capsule Take 1-2 capsules (100-200 mg total) by mouth 3 (three) times daily as needed for cough. 06/12/15   Tishira R Brewington, PA-C  Cetirizine HCl (ZYRTEC ALLERGY PO) Take by mouth.      Historical Provider, MD  fluticasone (FLONASE) 50 MCG/ACT nasal spray Place 2 sprays into both nostrils daily. 05/16/15   Wendie Agreste, MD  hydrochlorothiazide (HYDRODIURIL) 25 MG tablet Take 1 tablet (25 mg total) by mouth daily. 10/03/14   Jaynee Eagles, PA-C  ipratropium (ATROVENT) 0.06 % nasal spray Place 2 sprays into both nostrils 4 (four) times daily. 06/13/15   Billy Fischer, MD  medroxyPROGESTERone (DEPO-PROVERA) 150 MG/ML injection INJECT INTRAMUSCULARLY AS DIRECTED 03/05/15   Terrance Mass, MD   Meds Ordered and Administered this Visit   Medications  methylPREDNISolone acetate (DEPO-MEDROL) injection 80  mg (not administered)    BP 171/101 mmHg  Pulse 99  Temp(Src) 99.8 F (37.7 C) (Oral)  Resp 16  SpO2 100% No data found.   Physical Exam  Constitutional: She is oriented to person, place, and time. She appears well-developed and well-nourished. No distress.  HENT:  Right Ear: External ear normal.  Left Ear: External ear normal.  Mouth/Throat: Oropharynx is clear and moist.  Eyes: Conjunctivae are normal. Pupils are equal, round, and reactive to light.  Neck: Normal range of motion. Neck supple.  Cardiovascular: Regular rhythm, normal heart sounds and  intact distal pulses.   Pulmonary/Chest: Effort normal and breath sounds normal. She has no wheezes. She has no rales.  Lymphadenopathy:    She has cervical adenopathy.  Neurological: She is alert and oriented to person, place, and time.  Skin: Skin is warm and dry.  Nursing note and vitals reviewed.   ED Course  Procedures (including critical care time)  Labs Review Labs Reviewed - No data to display  Imaging Review Dg Chest 2 View  06/13/2015   CLINICAL DATA:  Cough and asthma.  Shortness of breath.  Fever.  EXAM: CHEST  2 VIEW  COMPARISON:  05/16/2015  FINDINGS: Midline trachea. Borderline cardiomegaly. Mediastinal contours otherwise within normal limits. No pleural effusion or pneumothorax. Clear lungs.  IMPRESSION: Borderline cardiomegaly, without acute disease.   Electronically Signed   By: Abigail Miyamoto M.D.   On: 06/13/2015 17:59   X-rays reviewed and report per radiologist.  Visual Acuity Review  Right Eye Distance:   Left Eye Distance:   Bilateral Distance:    Right Eye Near:   Left Eye Near:    Bilateral Near:         MDM   1. Cough, persistent       Billy Fischer, MD 06/13/15 1850

## 2015-06-16 ENCOUNTER — Emergency Department (HOSPITAL_COMMUNITY): Payer: BC Managed Care – PPO

## 2015-06-16 ENCOUNTER — Emergency Department (HOSPITAL_COMMUNITY)
Admission: EM | Admit: 2015-06-16 | Discharge: 2015-06-17 | Disposition: A | Discharge status: Home / Self Care | Payer: BC Managed Care – PPO | Attending: Emergency Medicine | Admitting: Emergency Medicine

## 2015-06-16 ENCOUNTER — Encounter (HOSPITAL_COMMUNITY): Payer: Self-pay | Admitting: *Deleted

## 2015-06-16 DIAGNOSIS — J45901 Unspecified asthma with (acute) exacerbation: Secondary | ICD-10-CM | POA: Diagnosis not present

## 2015-06-16 DIAGNOSIS — E876 Hypokalemia: Secondary | ICD-10-CM

## 2015-06-16 DIAGNOSIS — R05 Cough: Secondary | ICD-10-CM | POA: Insufficient documentation

## 2015-06-16 DIAGNOSIS — R0602 Shortness of breath: Secondary | ICD-10-CM | POA: Diagnosis present

## 2015-06-16 DIAGNOSIS — R059 Cough, unspecified: Secondary | ICD-10-CM

## 2015-06-16 DIAGNOSIS — Z79899 Other long term (current) drug therapy: Secondary | ICD-10-CM | POA: Insufficient documentation

## 2015-06-16 DIAGNOSIS — Z880 Allergy status to penicillin: Secondary | ICD-10-CM | POA: Insufficient documentation

## 2015-06-16 DIAGNOSIS — I10 Essential (primary) hypertension: Secondary | ICD-10-CM | POA: Insufficient documentation

## 2015-06-16 DIAGNOSIS — Z7951 Long term (current) use of inhaled steroids: Secondary | ICD-10-CM | POA: Diagnosis not present

## 2015-06-16 HISTORY — DX: Cardiomegaly: I51.7

## 2015-06-16 LAB — I-STAT CHEM 8, ED
BUN: 14 mg/dL (ref 6–20)
Calcium, Ion: 1.18 mmol/L (ref 1.12–1.23)
Chloride: 103 mmol/L (ref 101–111)
Creatinine, Ser: 0.9 mg/dL (ref 0.44–1.00)
GLUCOSE: 110 mg/dL — AB (ref 65–99)
HCT: 46 % (ref 36.0–46.0)
HEMOGLOBIN: 15.6 g/dL — AB (ref 12.0–15.0)
Potassium: 2.6 mmol/L — CL (ref 3.5–5.1)
Sodium: 142 mmol/L (ref 135–145)
TCO2: 25 mmol/L (ref 0–100)

## 2015-06-16 LAB — CBC WITH DIFFERENTIAL/PLATELET
BASOS PCT: 0 % (ref 0–1)
Basophils Absolute: 0 10*3/uL (ref 0.0–0.1)
EOS ABS: 0.1 10*3/uL (ref 0.0–0.7)
Eosinophils Relative: 1 % (ref 0–5)
HCT: 41.7 % (ref 36.0–46.0)
Hemoglobin: 13.9 g/dL (ref 12.0–15.0)
Lymphocytes Relative: 31 % (ref 12–46)
Lymphs Abs: 2.2 10*3/uL (ref 0.7–4.0)
MCH: 29.4 pg (ref 26.0–34.0)
MCHC: 33.3 g/dL (ref 30.0–36.0)
MCV: 88.3 fL (ref 78.0–100.0)
MONO ABS: 0.7 10*3/uL (ref 0.1–1.0)
Monocytes Relative: 10 % (ref 3–12)
Neutro Abs: 4.1 10*3/uL (ref 1.7–7.7)
Neutrophils Relative %: 58 % (ref 43–77)
PLATELETS: 310 10*3/uL (ref 150–400)
RBC: 4.72 MIL/uL (ref 3.87–5.11)
RDW: 12.9 % (ref 11.5–15.5)
WBC: 7.1 10*3/uL (ref 4.0–10.5)

## 2015-06-16 LAB — D-DIMER, QUANTITATIVE (NOT AT ARMC)

## 2015-06-16 MED ORDER — POTASSIUM CHLORIDE CRYS ER 20 MEQ PO TBCR
40.0000 meq | EXTENDED_RELEASE_TABLET | Freq: Once | ORAL | Status: AC
  Administered 2015-06-16: 40 meq via ORAL
  Filled 2015-06-16: qty 2

## 2015-06-16 MED ORDER — POTASSIUM CHLORIDE 10 MEQ/100ML IV SOLN
10.0000 meq | Freq: Once | INTRAVENOUS | Status: AC
  Administered 2015-06-17: 10 meq via INTRAVENOUS
  Filled 2015-06-16: qty 100

## 2015-06-16 MED ORDER — ALBUTEROL SULFATE (2.5 MG/3ML) 0.083% IN NEBU
5.0000 mg | INHALATION_SOLUTION | Freq: Once | RESPIRATORY_TRACT | Status: AC
  Administered 2015-06-16: 5 mg via RESPIRATORY_TRACT
  Filled 2015-06-16: qty 6

## 2015-06-16 MED ORDER — SODIUM CHLORIDE 0.9 % IV BOLUS (SEPSIS)
1000.0000 mL | Freq: Once | INTRAVENOUS | Status: AC
Start: 2015-06-16 — End: 2015-06-17
  Administered 2015-06-16: 1000 mL via INTRAVENOUS

## 2015-06-16 NOTE — ED Provider Notes (Signed)
CSN: [id number]     Arrival date & time 06/16/15  2043 History   First MD Initiated Contact with Patient 06/16/15 2225     Chief Complaint  Patient presents with  . Shortness of Breath     (Consider location/radiation/quality/duration/timing/severity/associated sxs/prior Treatment) HPI   44 year old female with history of asthma and allergy and hypertension presenting for evaluation of shortness of breath. Patient report for months ago she had a bad cold that lasted for a week. Once it resolved she developed a long-lasting cough that never fully went away. Cough is persistent, sometimes worsening with laying down, and sometimes worsen with exertion. Lately her cough has been getting progressively worse coming with pleuritic chest pain and shortness of breath. Shortness of breath worsening when cough and with exertion. She also endorsed wheezing, and having to use her inhaler more than usual. For the past several days she used her albuterol inhaler twice a day. She also noticed an enlarged lymph node on the right side of neck that has been ongoing for the same duration. She has been seen by her PCP for this complaint several times in the past and had blood work and chest x-ray without any specific finding. She went to urgent care 3 days ago for this complaint. Had a normal chest x-ray at that time and received steroid. Her symptoms still persist, she is concerned. She denies any prior history of PE or DVT, no recent surgery, prolonged bed rest, active cancer or hemoptysis. She also denies having fever or chills. No nausea vomiting or diarrhea and no abdominal pain or back pain. She has noticed some recurrent pain to her right hip that radiates down to her right leg but denies any leg swelling. She is on Depakote shot. Given the duration of her condition patient decided to come to the ER for further evaluation. Patient is a nonsmoker. No significant history of cardiac disease.  Past Medical History   Diagnosis Date  . Normal spontaneous vaginal delivery     3  . Hypertension   . Asthma   . Allergy   . Cardiomegaly    Past Surgical History  Procedure Laterality Date  . Carpal tunnel release  2009  . Carpal tunnel release Left    Family History  Problem Relation Age of Onset  . Hypertension Mother   . Cancer Father     COLON  . Breast cancer Maternal Grandmother   . Hypertension Sister    Social History  Substance Use Topics  . Smoking status: Never Smoker   . Smokeless tobacco: Never Used  . Alcohol Use: 0.0 oz/week    0 Standard drinks or equivalent per week     Comment: OCC- WINE   OB History    Gravida Para Term Preterm AB TAB SAB Ectopic Multiple Living   3 3 3       3      Review of Systems  All other systems reviewed and are negative.     Allergies  Penicillins  Home Medications   Prior to Admission medications   Medication Sig Start Date End Date Taking? Authorizing Provider  albuterol (PROVENTIL HFA;VENTOLIN HFA) 108 (90 BASE) MCG/ACT inhaler Inhale 2 puffs into the lungs every 4 (four) hours as needed for wheezing (cough, shortness of breath or wheezing.). 05/17/14   Gay Filler Copland, MD  amitriptyline (ELAVIL) 10 MG tablet Take 1 tablet (10 mg total) by mouth at bedtime. 01/25/15   Araceli Bouche, PA  baclofen (LIORESAL) 10 MG  tablet Take 1 tablet (10 mg total) by mouth 3 (three) times daily. Patient not taking: Reported on 06/12/2015 01/25/15   Araceli Bouche, PA  benzonatate (TESSALON) 100 MG capsule Take 1-2 capsules (100-200 mg total) by mouth 3 (three) times daily as needed for cough. 06/12/15   Tishira R Brewington, PA-C  Cetirizine HCl (ZYRTEC ALLERGY PO) Take by mouth.      Historical Provider, MD  fluticasone (FLONASE) 50 MCG/ACT nasal spray Place 2 sprays into both nostrils daily. 05/16/15   Wendie Agreste, MD  hydrochlorothiazide (HYDRODIURIL) 25 MG tablet Take 1 tablet (25 mg total) by mouth daily. 10/03/14   Jaynee Eagles, PA-C  ipratropium  (ATROVENT) 0.06 % nasal spray Place 2 sprays into both nostrils 4 (four) times daily. 06/13/15   Billy Fischer, MD  medroxyPROGESTERone (DEPO-PROVERA) 150 MG/ML injection INJECT INTRAMUSCULARLY AS DIRECTED 03/05/15   Terrance Mass, MD   BP 176/95 mmHg  Pulse 108  Temp(Src) 98.1 F (36.7 C) (Oral)  Resp 22  Wt 178 lb (80.74 kg)  SpO2 99% Physical Exam  Constitutional: She appears well-developed and well-nourished. No distress.  African-American female, mildly tachypneic.  HENT:  Head: Atraumatic.  Right Ear: External ear normal.  Left Ear: External ear normal.  Nose: Nose normal.  Mouth/Throat: Oropharynx is clear and moist.  Eyes: Conjunctivae are normal.  Neck: Normal range of motion. Neck supple. No JVD present. No tracheal deviation present.  Cardiovascular:  Tachycardia without murmurs rubs or gallops  Pulmonary/Chest: Effort normal and breath sounds normal. No stridor. No respiratory distress. She has no wheezes.  Mild tachypneic and tachycardic without wheezes, rales, rhonchi  Abdominal: Soft. There is no tenderness.  Musculoskeletal: She exhibits no edema.  Bilateral lowest images without palpable cords, erythema, edema. Intact distal pedal pulses.  Lymphadenopathy:    She has no cervical adenopathy.  Neurological: She is alert.  Skin: No rash noted.  Psychiatric: She has a normal mood and affect.  Nursing note and vitals reviewed.   ED Course  Procedures (including critical care time)  Patient here with recurrent persistent cough also complaining of pleuritic chest pain and shortness of breath. She is tachycardic therefore cannot rule out PE using PERC criteria. Given the possibility of possible PE, a d-dimer was obtained. A chest x-ray showed no signs of infection. Further workup initiated.  11:45 PM  D-dimer is normal. However patient's potassium is 2.6.  Pt currently on HCTZ which may cause hypokalemia.  EKG is nonspecific.  Supplementation given.  Care  discussed with my attending.   12:19 AM Pt's HEART score is 1, low suspicion for MACE.  Patient ambulating while maintaining 100% oxygen on room air.  12:56 AM signout to oncoming provider who will d/c pt once her potassium drip is finished.  Pt will f/u with PCP for further care.    Labs Review Labs Reviewed  I-STAT CHEM 8, ED - Abnormal; Notable for the following:    Potassium 2.6 (*)    Glucose, Bld 110 (*)    Hemoglobin 15.6 (*)    All other components within normal limits  CBC WITH DIFFERENTIAL/PLATELET  TROPONIN I  D-DIMER, QUANTITATIVE (NOT AT [facility])    Imaging Review Dg Chest 2 View  06/16/2015   CLINICAL DATA:  Mid chest pain 2 right-sided chest pain with productive cough and shortness of breath for 4 months  EXAM: CHEST  2 VIEW  COMPARISON:  06/13/2015  FINDINGS: Heart size upper normal and stable. Vascular pattern normal. Lungs clear.  Bony thorax intact.  IMPRESSION: No active cardiopulmonary disease.   Electronically Signed   By: Skipper Cliche M.D.   On: 06/16/2015 21:10   I have personally reviewed and evaluated these images and lab results as part of my medical decision-making.   EKG Interpretation   Date/Time:  Monday June 16 2015 20:55:18 EDT Ventricular Rate:  99 PR Interval:  146 QRS Duration: 88 QT Interval:  360 QTC Calculation: 462 R Axis:   36 Text Interpretation:  Normal sinus rhythm Possible Left atrial enlargement  Nonspecific T wave abnormality Prolonged QT Abnormal ECG Confirmed by  Hazle Coca [phone]) on 06/16/2015 11:47:24 PM      MDM   Final diagnoses:  Shortness of breath  Cough  Hypokalemia   I have reviewed nursing notes and vital signs. I personally viewed the imaging tests through PACS system and agrees with radiologist's intepretation I reviewed available ER/hospitalization records through the EMR   BP 148/85 mmHg  Pulse 93  Temp(Src) 98.3 F (36.8 C) (Oral)  Resp 20  Wt 178 lb (80.74 kg)  SpO2 98%     Domenic Moras,  PA-C 06/17/15 8099  Everlene Balls, MD 06/17/15 [phone]

## 2015-06-16 NOTE — ED Notes (Signed)
Pt states that she has had a cough since May; pt states that it is a productive cough with yellow to clear mucous; pt complain feeling short of breath; pt states that the shortness of breath is worse when coughing; pt denies exertional shortness of breath; pt denies chest pain; pt states that it is sore to cough at times

## 2015-06-17 DIAGNOSIS — J45901 Unspecified asthma with (acute) exacerbation: Secondary | ICD-10-CM | POA: Diagnosis not present

## 2015-06-17 LAB — TROPONIN I: Troponin I: <0.03 ng/mL (ref <0.031)

## 2015-06-17 MED ORDER — POTASSIUM CHLORIDE CRYS ER 20 MEQ PO TBCR: 20.0000 meq | EXTENDED_RELEASE_TABLET | Freq: Every day | ORAL | Status: DC

## 2015-06-17 NOTE — Discharge Instructions (Signed)
You have persistent cough.  No evidence of lung infection or signs of blood clot on today's exam.  Your potassium level is low, this may be due to your blood pressure medication hydrochlorothiazide.  Take potassium pills as prescribed and discuss this with your doctor.  Return if your condition worsen or if you have other concerns.

## 2015-06-30 ENCOUNTER — Encounter: Payer: Self-pay | Admitting: Internal Medicine

## 2015-06-30 ENCOUNTER — Other Ambulatory Visit (INDEPENDENT_AMBULATORY_CARE_PROVIDER_SITE_OTHER): Payer: BC Managed Care – PPO

## 2015-06-30 ENCOUNTER — Ambulatory Visit (INDEPENDENT_AMBULATORY_CARE_PROVIDER_SITE_OTHER): Payer: BC Managed Care – PPO | Admitting: Internal Medicine

## 2015-06-30 VITALS — BP 130/90 | HR 102 | Ht 63.0 in | Wt 175.0 lb

## 2015-06-30 DIAGNOSIS — E669 Obesity, unspecified: Secondary | ICD-10-CM

## 2015-06-30 DIAGNOSIS — R05 Cough: Secondary | ICD-10-CM | POA: Diagnosis not present

## 2015-06-30 DIAGNOSIS — R058 Other specified cough: Secondary | ICD-10-CM

## 2015-06-30 DIAGNOSIS — I10 Essential (primary) hypertension: Secondary | ICD-10-CM

## 2015-06-30 LAB — CBC WITH DIFFERENTIAL/PLATELET
BASOS PCT: 0.4 % (ref 0.0–3.0)
Basophils Absolute: 0 10*3/uL (ref 0.0–0.1)
EOS ABS: 0.1 10*3/uL (ref 0.0–0.7)
Eosinophils Relative: 1 % (ref 0.0–5.0)
HEMATOCRIT: 43.3 % (ref 36.0–46.0)
Hemoglobin: 14.6 g/dL (ref 12.0–15.0)
LYMPHS ABS: 1.3 10*3/uL (ref 0.7–4.0)
LYMPHS PCT: 24 % (ref 12.0–46.0)
MCHC: 33.7 g/dL (ref 30.0–36.0)
MCV: 89.2 fl (ref 78.0–100.0)
Monocytes Absolute: 0.5 10*3/uL (ref 0.1–1.0)
Monocytes Relative: 8.3 % (ref 3.0–12.0)
NEUTROS ABS: 3.7 10*3/uL (ref 1.4–7.7)
NEUTROS PCT: 66.3 % (ref 43.0–77.0)
PLATELETS: 297 10*3/uL (ref 150.0–400.0)
RBC: 4.86 Mil/uL (ref 3.87–5.11)
RDW: 13 % (ref 11.5–15.5)
WBC: 5.6 10*3/uL (ref 4.0–10.5)

## 2015-06-30 MED ORDER — BISOPROLOL-HYDROCHLOROTHIAZIDE 2.5-6.25 MG PO TABS: 1.0000 | ORAL_TABLET | Freq: Every day | ORAL | Status: DC

## 2015-06-30 MED ORDER — TRAMADOL HCL 50 MG PO TABS: ORAL_TABLET | ORAL | Status: DC

## 2015-06-30 MED ORDER — FAMOTIDINE 20 MG PO TABS: ORAL_TABLET | ORAL | Status: DC

## 2015-06-30 MED ORDER — PREDNISONE 10 MG PO TABS: ORAL_TABLET | ORAL | Status: DC

## 2015-06-30 MED ORDER — PANTOPRAZOLE SODIUM 40 MG PO TBEC: 40.0000 mg | DELAYED_RELEASE_TABLET | Freq: Every day | ORAL | Status: DC

## 2015-06-30 NOTE — Progress Notes (Signed)
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 Pulmonary Consult    self referred. Pt c/o of SOB with activity and rapid heart rate. Chest tightness/congestion, wheezing, and prod cough with yellow thick mucus.    acutely ill early May 2016 with scratchy throat/runny nose, loss voice, hacking cough to point of gagging and short of breath. Initially more night than day and now more day > night and does not wake her up. Already rx with multiple abx and steroids s benefit> started on saba and then problems with palpitations and hypokalemia.  Sob mostly when coughing.  No obvious other patterns in day to day or daytime variabilty or assoc classically pleuritic or ex  cp  overt sinus or hb symptoms. No unusual exp hx or h/o childhood pna/ asthma or knowledge of premature birth.  Sleeping ok without nocturnal  or early am exacerbation  of respiratory  c/o's or need for noct saba. Also denies any obvious fluctuation of symptoms with weather or environmental changes or other aggravating or alleviating factors except as outlined above   Current Medications, Allergies, Complete Past Medical History, Past Surgical History, Family History, and Social History were reviewed in [employer] record.              Review of Systems  Constitutional: Negative for fever and unexpected weight change.  HENT: Positive for sneezing. Negative for congestion, dental problem, nosebleeds, postnasal drip, rhinorrhea, sinus pressure, sore throat and trouble swallowing.     Eyes: Negative for redness and itching.  Respiratory: Positive for cough, chest tightness, shortness of breath and wheezing.   Cardiovascular: Positive for palpitations. Negative for leg swelling.  Gastrointestinal: Negative for nausea and vomiting.  Genitourinary: Negative for dysuria.  Musculoskeletal: Negative for joint swelling.  Skin: Negative for rash.  Neurological: Positive for headaches.  Hematological: Does not bruise/bleed easily.  Psychiatric/Behavioral: Negative for dysphoric mood. The patient is not nervous/anxious.        Objective:   Physical Exam  amb bf with hacking upper cough   Wt Readings from Last 3 Encounters:  06/30/15 175 lb (79.379 kg)  06/16/15 178 lb (80.74 kg)  06/12/15 178 lb (80.74 kg)    Vital signs reviewed    HEENT: nl dentition, turbinates, and orophanx. Nl external ear canals without cough reflex   NECK :  without JVD/Nodes/TM/ nl carotid upstrokes bilaterally   LUNGS: no acc muscle use, clear to A and P bilaterally without cough on insp or exp maneuvers   CV:  RRR  no s3 or murmur or increase in P2, no edema   ABD:  soft and nontender with nl excursion in the supine position. No bruits or organomegaly, bowel sounds nl  MS:  warm without deformities, calf tenderness, cyanosis or clubbing  SKIN: warm and dry without lesions    NEURO:  alert, approp, no deficits    I personally reviewed images and agree with radiology impression as follows:  CXR:  06/16/15 No active cardiopulmonary disease.  Assessment & Plan:

## 2015-06-30 NOTE — Patient Instructions (Addendum)
The key to effective treatment for your cough is eliminating the non-stop cycle of cough you're stuck in long enough to let your airway heal completely and then see if there is anything still making you cough once you stop the cough suppression, but this should take no more than 5 days to figure out  First take delsym two tsp every 12 hours and supplement if needed with  tramadol 50 mg up to 2 every 4 hours to suppress the urge to cough at all or even clear your throat. Swallowing water or using ice chips/non mint and menthol containing candies (such as lifesavers or sugarless jolly ranchers) are also effective.  You should rest your voice and avoid activities that you know make you cough.  Once you have eliminated the cough for 3 straight days try reducing the tramadol first,  then the delsym as tolerated.    Prednisone 10 mg take  4 each am x 2 days,   2 each am x 2 days,  1 each am x 2 days and stop (this is to eliminate allergies and inflammation from coughing)  Protonix (pantoprazole) Take 30-60 min before first meal of the day and Pepcid 20 mg one bedtime plus chlorpheniramine 4 mg x 2 at bedtime (both available over the counter)  until cough is completely gone for at least a week without the need for cough suppression  GERD (REFLUX)  is an extremely common cause of respiratory symptoms, many times with no significant heartburn at all.    It can be treated with medication, but also with lifestyle changes including avoidance of late meals, excessive alcohol, smoking cessation, and avoid fatty foods, chocolate, peppermint, colas, red wine, and acidic juices such as orange juice.  NO MINT OR MENTHOL PRODUCTS SO NO COUGH DROPS  USE HARD CANDY INSTEAD (jolley ranchers or Stover's or Lifesavers (all available in sugarless versions) NO OIL BASED VITAMINS - use powdered substitutes.  Please schedule a follow up office visit in 2 weeks, sooner if needed

## 2015-07-01 ENCOUNTER — Encounter: Payer: Self-pay | Admitting: Internal Medicine

## 2015-07-01 DIAGNOSIS — E669 Obesity, unspecified: Secondary | ICD-10-CM | POA: Insufficient documentation

## 2015-07-01 LAB — ALLERGY FULL PROFILE
Allergen, D pternoyssinus,d7: <0.1 kU/L
Allergen,Goose feathers, e70: <0.1 kU/L
Aspergillus fumigatus, m3: <0.1 kU/L
Box Elder IgE: <0.1 kU/L
Candida Albicans: <0.1 kU/L
Common Ragweed: <0.1 kU/L
D. farinae: <0.1 kU/L
G005 Rye, Perennial: <0.1 kU/L
Goldenrod: <0.1 kU/L
Helminthosporium halodes: <0.1 kU/L
House Dust Hollister: <0.1 kU/L
IgE (Immunoglobulin E), Serum: <2 kU/L (ref <115)
Lamb's Quarters: <0.1 kU/L
Stemphylium Botryosum: <0.1 kU/L
Sycamore Tree: <0.1 kU/L
Timothy Grass: <0.1 kU/L

## 2015-07-01 NOTE — Assessment & Plan Note (Signed)
Not Adequate control on present rx, reviewed > rec d/c hyrodiuril and try ziac 5-6.25 (more likely to help with palpitations/ less likely to drop K, esp if off saba) an

## 2015-07-01 NOTE — Assessment & Plan Note (Addendum)
The most common causes of chronic cough in immunocompetent adults include the following: upper airway cough syndrome (UACS), previously referred to as postnasal drip syndrome (PNDS), which is caused by variety of rhinosinus conditions; (2) asthma; (3) GERD; (4) chronic bronchitis from cigarette smoking or other inhaled environmental irritants; (5) nonasthmatic eosinophilic bronchitis; and (6) bronchiectasis.   These conditions, singly or in combination, have accounted for up to 94% of the causes of chronic cough in prospective studies.   Other conditions have constituted no >6% of the causes in prospective studies These have included bronchogenic carcinoma, chronic interstitial pneumonia, sarcoidosis, left ventricular failure, ACEI-induced cough, and aspiration from a condition associated with pharyngeal dysfunction.    Chronic cough is often simultaneously caused by more than one condition. A single cause has been found from 38 to 82% of the time, multiple causes from 18 to 62%. Multiply caused cough has been the result of three diseases up to 42% of the time.       Based on hx and exam, this is most likely:  Classic Upper airway cough syndrome, so named because it's frequently impossible to sort out how much is  CR/sinusitis with freq throat clearing (which can be related to primary GERD)   vs  causing  secondary (" extra esophageal")  GERD from wide swings in gastric pressure that occur with throat clearing, often  promoting self use of mint and menthol lozenges that reduce the lower esophageal sphincter tone and exacerbate the problem further in a cyclical fashion.   These are the same pts (now being labeled as having "irritable larynx syndrome" by some cough centers) who not infrequently have a history of having failed to tolerate ace inhibitors,  dry powder inhalers or biphosphonates or report having atypical reflux symptoms that don't respond to standard doses of PPI , and are easily confused as  having aecopd or asthma flares by even experienced allergists/ pulmonologists.   The first step is to maximize acid suppression and eliminate cyclical coughing/ stop all inhalers then regroup in 2 weeks  I had an extended discussion with the patient reviewing all relevant studies completed to date and  lasting 35 min  1) Explained: The standardized cough guidelines published in Chest by Lissa Morales in 2006 are still the best available and consist of a multiple step process (up to 12!) , not a single office visit,  and are intended  to address this problem logically,  with an alogrithm dependent on response to empiric treatment at  each progressive step  to determine a specific diagnosis with  minimal addtional testing needed. Therefore if adherence is an issue or can't be accurately verified,  it's very unlikely the standard evaluation and treatment will be successful here.    Furthermore, response to therapy (other than acute cough suppression, which should only be used short term with avoidance of narcotic containing cough syrups if possible), can be a gradual process for which the patient may perceive immediate benefit.  Unlike going to an eye doctor where the best perscription is almost always the first one and is immediately effective, this is almost never the case in the management of chronic cough syndromes. Therefore the patient needs to commit up front to consistently adhere to recommendations  for up to 6 weeks of therapy directed at the likely underlying problem(s) before the response can be reasonably evaluated.     2) Each maintenance medication was reviewed in detail including most importantly the difference between maintenance and prns and  under what circumstances the prns are to be triggered using an action plan format that is not reflected in the computer generated alphabetically organized AVS.    Please see instructions for details which were reviewed in writing and the patient  given a copy highlighting the part that I personally wrote and discussed at today's ov.   See instructions for specific recommendations which were reviewed directly with the patient who was given a copy with highlighter outlining the key components.

## 2015-07-01 NOTE — Assessment & Plan Note (Signed)
Body mass index is 31.01   Lab Results  Component Value Date   TSH 0.696 11/14/2014     Contributing to gerd tendency/ doe/reviewed need  achieve and maintain neg calorie balance > defer f/u primary care including intermittently monitoring thyroid status

## 2015-07-02 ENCOUNTER — Telehealth: Payer: Self-pay | Admitting: Family Medicine

## 2015-07-03 ENCOUNTER — Telehealth: Payer: Self-pay | Admitting: Internal Medicine

## 2015-07-03 ENCOUNTER — Ambulatory Visit (INDEPENDENT_AMBULATORY_CARE_PROVIDER_SITE_OTHER): Payer: BC Managed Care – PPO | Admitting: Emergency Medicine

## 2015-07-03 VITALS — BP 156/100 | HR 89 | Temp 98.8°F | Resp 18 | Ht 63.0 in | Wt 174.8 lb

## 2015-07-03 DIAGNOSIS — S39011A Strain of muscle, fascia and tendon of abdomen, initial encounter: Secondary | ICD-10-CM

## 2015-07-03 DIAGNOSIS — R109 Unspecified abdominal pain: Secondary | ICD-10-CM | POA: Diagnosis not present

## 2015-07-03 LAB — POCT UA - MICROSCOPIC ONLY
Bacteria, U Microscopic: NEGATIVE
CASTS, UR, LPF, POC: NEGATIVE
Crystals, Ur, HPF, POC: NEGATIVE
Epithelial cells, urine per micros: NEGATIVE
MUCUS UA: NEGATIVE
RBC, urine, microscopic: NEGATIVE
YEAST UA: NEGATIVE

## 2015-07-03 LAB — POCT URINALYSIS DIPSTICK
Bilirubin, UA: NEGATIVE
Glucose, UA: NEGATIVE
KETONES UA: NEGATIVE
Leukocytes, UA: NEGATIVE
Nitrite, UA: NEGATIVE
PH UA: 6.5
PROTEIN UA: NEGATIVE
RBC UA: NEGATIVE
SPEC GRAV UA: 1.02
Urobilinogen, UA: 1

## 2015-07-03 MED ORDER — NAPROXEN SODIUM 550 MG PO TABS: 550.0000 mg | ORAL_TABLET | Freq: Two times a day (BID) | ORAL | Status: DC

## 2015-07-03 MED ORDER — CYCLOBENZAPRINE HCL 10 MG PO TABS
10.0000 mg | ORAL_TABLET | Freq: Three times a day (TID) | ORAL | Status: DC | PRN
Start: 2015-07-03 — End: 2015-07-13

## 2015-07-03 MED ORDER — HYDROCODONE-ACETAMINOPHEN 5-325 MG PO TABS: 1.0000 | ORAL_TABLET | ORAL | Status: DC | PRN

## 2015-07-03 NOTE — Telephone Encounter (Signed)
Called and spoke to pt. Pt questioning how to take the medication Dr. Melvyn Novas prescribed on 9.12.16. Reiterated the directions on the pt's AVS that describes when and how to the medication MW prescribed. Pt verbalized understanding and denied any further questions or concerns at this time.

## 2015-07-03 NOTE — Progress Notes (Signed)
Subjective:  Patient ID: Megan Lang, female    DOB: 1971/09/20  Age: 44 y.o. MRN: [id number]  CC: Flank Pain   HPI Megan Lang presents  she's been under treatment for cough for several months. She most recently seen by the pulmonologist and is under treatment with medication. She now has come to the office complaining of pain in her left flank and also in her left mid abdomen. She said she feels a lump in her abdomen. This pain is come on over the last couple days. She has no nausea or vomiting. No sputum production. She has no dysuria urgency or frequency. No blood in her urine. No stool change. She has no fever or chills.  History Megan Lang has a past medical history of Normal spontaneous vaginal delivery; Hypertension; Asthma; Allergy; and Cardiomegaly.   She has past surgical history that includes Carpal tunnel release (2009) and Carpal tunnel release (Left).   Her  family history includes Breast cancer in her maternal grandmother; Cancer in her father; Hypertension in her mother and sister.  She   reports that she has never smoked. She has never used smokeless tobacco. She reports that she drinks alcohol. She reports that she does not use illicit drugs.  Outpatient Prescriptions Prior to Visit  Medication Sig Dispense Refill  . bisoprolol-hydrochlorothiazide (ZIAC) 2.5-6.25 MG per tablet Take 1 tablet by mouth daily. 30 tablet 11  . famotidine (PEPCID) 20 MG tablet One at bedtime 30 tablet 2  . medroxyPROGESTERone (DEPO-PROVERA) 150 MG/ML injection INJECT INTRAMUSCULARLY AS DIRECTED (Patient taking differently: INJECT INTRAMUSCULARLY AS DIRECTED EVERY 3 MONTHS) 1 mL 2  . pantoprazole (PROTONIX) 40 MG tablet Take 1 tablet (40 mg total) by mouth daily. Take 30-60 min before first meal of the day 30 tablet 2  . predniSONE (DELTASONE) 10 MG tablet Take  4 each am x 2 days,   2 each am x 2 days,  1 each am x 2 days and stop 14 tablet 0  . traMADol (ULTRAM) 50 MG tablet 1-2 every  4 hours as needed for cough or pain 40 tablet 0  . potassium chloride SA (K-DUR,KLOR-CON) 20 MEQ tablet Take 1 tablet (20 mEq total) by mouth daily. (Patient not taking: Reported on 07/03/2015) 3 tablet 0  . baclofen (LIORESAL) 10 MG tablet Take 1 tablet (10 mg total) by mouth 3 (three) times daily. (Patient not taking: Reported on 06/12/2015) 30 each 0  . Cetirizine HCl (ZYRTEC ALLERGY PO) Take by mouth daily.     . fluticasone (FLONASE) 50 MCG/ACT nasal spray Place 2 sprays into both nostrils daily. 16 g 6   No facility-administered medications prior to visit.    Social History   Social History  . Marital Status: Married    Spouse Name: N/A  . Number of Children: N/A  . Years of Education: N/A   Social History Main Topics  . Smoking status: Never Smoker   . Smokeless tobacco: Never Used  . Alcohol Use: 0.0 oz/week    0 Standard drinks or equivalent per week     Comment: OCC- WINE  . Drug Use: No  . Sexual Activity: No     Comment: 1ST INTERCOURSE- 9, PARTNERS- LESS THAN 5    Other Topics Concern  . None   Social History Narrative     Review of Systems  Constitutional: Negative for fever, chills and appetite change.  HENT: Negative for congestion, ear pain, postnasal drip, sinus pressure and sore throat.  Eyes: Negative for pain and redness.  Respiratory: Negative for cough, shortness of breath and wheezing.   Cardiovascular: Negative for leg swelling.  Gastrointestinal: Negative for nausea, vomiting, abdominal pain, diarrhea, constipation and blood in stool.  Endocrine: Negative for polyuria.  Genitourinary: Negative for dysuria, urgency, frequency and flank pain.  Musculoskeletal: Negative for gait problem.  Skin: Negative for rash.  Neurological: Negative for weakness and headaches.  Psychiatric/Behavioral: Negative for confusion and decreased concentration. The patient is not nervous/anxious.     Objective:  BP 156/100 mmHg  Pulse 89  Temp(Src) 98.8 F (37.1  C) (Oral)  Resp 18  Ht 5\' 3"  (1.6 m)  Wt 174 lb 12.8 oz (79.289 kg)  BMI 30.97 kg/m2  SpO2 99%  Physical Exam  Constitutional: She is oriented to person, place, and time. She appears well-developed and well-nourished.  HENT:  Head: Normocephalic and atraumatic.  Eyes: Conjunctivae are normal. Pupils are equal, round, and reactive to light.  Pulmonary/Chest: Effort normal.  Musculoskeletal: She exhibits no edema.  Neurological: She is alert and oriented to person, place, and time.  Skin: Skin is dry.  Psychiatric: She has a normal mood and affect. Her behavior is normal. Thought content normal.   She has mild left midabdominal tenderness with no mass palpable.   Assessment & Plan:   Megan Lang was seen today for flank pain.  Diagnoses and all orders for this visit:  Flank pain -     POCT urinalysis dipstick -     POCT UA - Microscopic Only  Abdominal muscle strain, initial encounter  Other orders -     naproxen sodium (ANAPROX DS) 550 MG tablet; Take 1 tablet (550 mg total) by mouth 2 (two) times daily with a meal. -     cyclobenzaprine (FLEXERIL) 10 MG tablet; Take 1 tablet (10 mg total) by mouth 3 (three) times daily as needed for muscle spasms. -     HYDROcodone-acetaminophen (NORCO) 5-325 MG per tablet; Take 1-2 tablets by mouth every 4 (four) hours as needed.   I have discontinued Megan Lang's Cetirizine HCl (ZYRTEC ALLERGY PO), baclofen, and fluticasone. I am also having her start on naproxen sodium, cyclobenzaprine, and HYDROcodone-acetaminophen. Additionally, I am having her maintain her medroxyPROGESTERone, potassium chloride SA, bisoprolol-hydrochlorothiazide, predniSONE, traMADol, famotidine, and pantoprazole.  Meds ordered this encounter  Medications  . naproxen sodium (ANAPROX DS) 550 MG tablet    Sig: Take 1 tablet (550 mg total) by mouth 2 (two) times daily with a meal.    Dispense:  40 tablet    Refill:  0  . cyclobenzaprine (FLEXERIL) 10 MG tablet    Sig:  Take 1 tablet (10 mg total) by mouth 3 (three) times daily as needed for muscle spasms.    Dispense:  30 tablet    Refill:  0  . HYDROcodone-acetaminophen (NORCO) 5-325 MG per tablet    Sig: Take 1-2 tablets by mouth every 4 (four) hours as needed.    Dispense:  30 tablet    Refill:  0    Appropriate red flag conditions were discussed with the patient as well as actions that should be taken.  Patient expressed his understanding.  Follow-up: Return if symptoms worsen or fail to improve.  Roselee Culver, MD   Results for orders placed or performed in visit on 07/03/15  POCT urinalysis dipstick  Result Value Ref Range   Color, UA yellow    Clarity, UA clear    Glucose, UA neg    Bilirubin,  UA neg    Ketones, UA neg    Spec Grav, UA 1.020    Blood, UA neg    pH, UA 6.5    Protein, UA neg    Urobilinogen, UA 1.0    Nitrite, UA neg    Leukocytes, UA Negative Negative  POCT UA - Microscopic Only  Result Value Ref Range   WBC, Ur, HPF, POC 0-1    RBC, urine, microscopic neg    Bacteria, U Microscopic neg    Mucus, UA neg    Epithelial cells, urine per micros neg    Crystals, Ur, HPF, POC neg    Casts, Ur, LPF, POC neg    Yeast, UA neg

## 2015-07-08 ENCOUNTER — Telehealth: Payer: Self-pay

## 2015-07-08 NOTE — Telephone Encounter (Signed)
Pt would like to get a referral for an ENT with Dr Pixie Casino. Please call [phone]

## 2015-07-08 NOTE — Telephone Encounter (Signed)
Left message for pt to call back., I need more information.  Has she been here about this issue? She has to come in first for a referral, if not.

## 2015-07-11 NOTE — Telephone Encounter (Signed)
Left message for pt to call back  °

## 2015-07-11 NOTE — Telephone Encounter (Signed)
Patient is calling back about referral. She states that she really needs this done because nothing has been resolved. I informed the patient that she was called two days ago and there was no answer. Please call! [phone]

## 2015-07-13 ENCOUNTER — Ambulatory Visit: Payer: BC Managed Care – PPO

## 2015-07-13 ENCOUNTER — Ambulatory Visit (INDEPENDENT_AMBULATORY_CARE_PROVIDER_SITE_OTHER): Payer: BC Managed Care – PPO | Admitting: Emergency Medicine

## 2015-07-13 VITALS — BP 148/88 | HR 72 | Temp 98.8°F | Resp 16 | Ht 63.0 in | Wt 171.4 lb

## 2015-07-13 DIAGNOSIS — J029 Acute pharyngitis, unspecified: Secondary | ICD-10-CM

## 2015-07-13 DIAGNOSIS — R1314 Dysphagia, pharyngoesophageal phase: Secondary | ICD-10-CM | POA: Diagnosis not present

## 2015-07-13 DIAGNOSIS — E876 Hypokalemia: Secondary | ICD-10-CM

## 2015-07-13 LAB — BASIC METABOLIC PANEL WITH GFR
BUN: 10 mg/dL (ref 7–25)
CALCIUM: 9.5 mg/dL (ref 8.6–10.2)
CO2: 26 mmol/L (ref 20–31)
Chloride: 104 mmol/L (ref 98–110)
Creat: 0.81 mg/dL (ref 0.50–1.10)
GFR, EST NON AFRICAN AMERICAN: 89 mL/min (ref >=60)
Glucose, Bld: 78 mg/dL (ref 65–99)
POTASSIUM: 3.7 mmol/L (ref 3.5–5.3)
SODIUM: 137 mmol/L (ref 135–146)

## 2015-07-13 LAB — POCT CBC
Granulocyte percent: 56 %G (ref 37–80)
HEMATOCRIT: 42.8 % (ref 37.7–47.9)
Hemoglobin: 13.3 g/dL (ref 12.2–16.2)
LYMPH, POC: 2.1 (ref 0.6–3.4)
MCH, POC: 27.7 pg (ref 27–31.2)
MCHC: 31.1 g/dL — AB (ref 31.8–35.4)
MCV: 89 fL (ref 80–97)
MID (cbc): 0.2 (ref 0–0.9)
MPV: 7.5 fL (ref 0–99.8)
PLATELET COUNT, POC: 265 10*3/uL (ref 142–424)
POC Granulocyte: 3 (ref 2–6.9)
POC LYMPH %: 39.7 % (ref 10–50)
POC MID %: 4.3 %M (ref 0–12)
RBC: 4.81 M/uL (ref 4.04–5.48)
RDW, POC: 13 %
WBC: 5.4 10*3/uL (ref 4.6–10.2)

## 2015-07-13 LAB — POCT RAPID STREP A (OFFICE): RAPID STREP A SCREEN: NEGATIVE

## 2015-07-13 NOTE — Progress Notes (Addendum)
Patient ID: Megan Lang, female   DOB: 29-Oct-1970, 44 y.o.   MRN: [id number]    This chart was scribed for Megan Jordan, MD by [facility], medical scribe at [facility].The patient was seen in exam room 10 and the patient's care was started at 2:02 PM.  Chief Complaint:  Chief Complaint  Patient presents with  . Sore Throat    pain swallowing with swollen neck glands x 1 week  . Cough     since May.. productive with clear to yellow color  . Sinus Problem    pt has seen ENT last week, frustrated with no dx   HPI: Megan Lang is a 44 y.o. female who reports to [facility] today complaining of a pain with swallowing. She describes the pain as a burning. Denies fever. Seen by ENT last week, unable to determine a diagnosis. Also, she has had a cough since May. 06/16/2015 K of 2.6 and 4.4 in January. Has a pulmonary specialist and seen by Dr. Melvyn Novas. Seen in the ED on 06/16/2015 for SOB.  Past Medical History  Diagnosis Date  . Normal spontaneous vaginal delivery     3  . Hypertension   . Asthma   . Allergy   . Cardiomegaly    Past Surgical History  Procedure Laterality Date  . Carpal tunnel release  2009  . Carpal tunnel release Left    Social History   Social History  . Marital Status: Married    Spouse Name: N/A  . Number of Children: N/A  . Years of Education: N/A   Social History Main Topics  . Smoking status: Never Smoker   . Smokeless tobacco: Never Used  . Alcohol Use: 0.0 oz/week    0 Standard drinks or equivalent per week     Comment: OCC- WINE  . Drug Use: No  . Sexual Activity: No     Comment: 1ST INTERCOURSE- 30, PARTNERS- LESS THAN 5    Other Topics Concern  . None   Social History Narrative   Family History  Problem Relation Age of Onset  . Hypertension Mother   . Cancer Father     COLON  . Breast cancer Maternal Grandmother   . Hypertension Sister    Allergies  Allergen Reactions  . Penicillins Rash   Prior to Admission  medications   Medication Sig Start Date End Date Taking? Authorizing Provider  bisoprolol-hydrochlorothiazide (ZIAC) 2.5-6.25 MG per tablet Take 1 tablet by mouth daily. 06/30/15  Yes Tanda Rockers, MD  medroxyPROGESTERone (DEPO-PROVERA) 150 MG/ML injection INJECT INTRAMUSCULARLY AS DIRECTED Patient taking differently: INJECT INTRAMUSCULARLY AS DIRECTED EVERY 3 MONTHS 03/05/15  Yes Terrance Mass, MD  potassium chloride SA (K-DUR,KLOR-CON) 20 MEQ tablet Take 1 tablet (20 mEq total) by mouth daily. 06/17/15  Yes Domenic Moras, PA-C   ROS: The patient denies fevers, chills, night sweats, unintentional weight loss, chest pain, palpitations, wheezing, dyspnea on exertion, nausea, vomiting, abdominal pain, dysuria, hematuria, melena, numbness, weakness, or tingling.  All other systems have been reviewed and were otherwise negative with the exception of those mentioned in the HPI and as above.    PHYSICAL EXAM: Filed Vitals:   07/13/15 1328  BP: 148/88  Pulse: 72  Temp: 98.8 F (37.1 C)  Resp: 16   Body mass index is 30.37 kg/(m^2).  General: Alert, no acute distress HEENT:  Normocephalic, atraumatic, oropharynx patent. Eye: Megan Lang [facility] Cardiovascular:  Regular rate and rhythm, no rubs murmurs or  gallops.  No Carotid bruits, radial pulse intact. No pedal edema.  Respiratory: Clear to auscultation bilaterally.  No wheezes, rales, or rhonchi.  No cyanosis, no use of accessory musculature Abdominal: No organomegaly, abdomen is soft and non-tender, positive bowel sounds.  No masses. Musculoskeletal: Gait intact. No edema, tenderness Skin: No rashes. Neurologic: Facial musculature symmetric. Psychiatric: Patient acts appropriately throughout our interaction. Lymphatic: No cervical or submandibular lymphadenopathy Genitourinary/Anorectal: No acute findings   LABS: Results for orders placed or performed in visit on 07/13/15  POCT CBC  Result Value Ref Range   WBC 5.4 4.6 - 10.2 K/uL    Lymph, poc 2.1 0.6 - 3.4   POC LYMPH PERCENT 39.7 10 - 50 %L   MID (cbc) 0.2 0 - 0.9   POC MID % 4.3 0 - 12 %M   POC Granulocyte 3.0 2 - 6.9   Granulocyte percent 56.0 37 - 80 %G   RBC 4.81 4.04 - 5.48 M/uL   Hemoglobin 13.3 12.2 - 16.2 g/dL   HCT, POC 42.8 37.7 - 47.9 %   MCV 89.0 80 - 97 fL   MCH, POC 27.7 27 - 31.2 pg   MCHC 31.1 (A) 31.8 - 35.4 g/dL   RDW, POC 13.0 %   Platelet Count, POC 265 142 - 424 K/uL   MPV 7.5 0 - 99.8 fL   Results for orders placed or performed in visit on 07/13/15  POCT CBC  Result Value Ref Range   WBC 5.4 4.6 - 10.2 K/uL   Lymph, poc 2.1 0.6 - 3.4   POC LYMPH PERCENT 39.7 10 - 50 %L   MID (cbc) 0.2 0 - 0.9   POC MID % 4.3 0 - 12 %M   POC Granulocyte 3.0 2 - 6.9   Granulocyte percent 56.0 37 - 80 %G   RBC 4.81 4.04 - 5.48 M/uL   Hemoglobin 13.3 12.2 - 16.2 g/dL   HCT, POC 42.8 37.7 - 47.9 %   MCV 89.0 80 - 97 fL   MCH, POC 27.7 27 - 31.2 pg   MCHC 31.1 (A) 31.8 - 35.4 g/dL   RDW, POC 13.0 %   Platelet Count, POC 265 142 - 424 K/uL   MPV 7.5 0 - 99.8 fL  POCT rapid strep A  Result Value Ref Range   Rapid Strep A Screen Negative Negative    EKG/XRAY:   Primary read interpreted by Dr. Everlene Farrier at [facility].  ASSESSMENT/PLAN: Patient has been to ENT. She is due to see Dr. Melvyn Novas in the morning. I suspect he will order a CT of the sinuses. Might also consider a CT of the neck. I have made a GI referral to be sure we are not dealing with any esophageal issue. Gross sideeffects, risk and benefits, and alternatives of medications d/w patient. Patient is aware that all medications have potential sideeffects and we are unable to predict every sideeffect or drug-drug interaction that may occur.  By signing my name below, I, Nadim Abuhashem, attest that this documentation has been prepared under the direction and in the presence of Megan Jordan, MD.  Electronically Signed: Lora Havens, medical scribe. 07/13/2015, 2:02 PM.  Arlyss Queen MD 07/13/2015 2:02  PM

## 2015-07-14 ENCOUNTER — Encounter: Payer: Self-pay | Admitting: Internal Medicine

## 2015-07-14 ENCOUNTER — Ambulatory Visit (INDEPENDENT_AMBULATORY_CARE_PROVIDER_SITE_OTHER): Payer: BC Managed Care – PPO | Admitting: Internal Medicine

## 2015-07-14 VITALS — BP 130/70 | HR 78 | Ht 63.0 in | Wt 168.2 lb

## 2015-07-14 DIAGNOSIS — I10 Essential (primary) hypertension: Secondary | ICD-10-CM | POA: Diagnosis not present

## 2015-07-14 DIAGNOSIS — R05 Cough: Secondary | ICD-10-CM | POA: Diagnosis not present

## 2015-07-14 DIAGNOSIS — R058 Other specified cough: Secondary | ICD-10-CM

## 2015-07-14 DIAGNOSIS — E669 Obesity, unspecified: Secondary | ICD-10-CM | POA: Diagnosis not present

## 2015-07-14 NOTE — Patient Instructions (Addendum)
Please see patient coordinator before you leave today  to schedule sinus CT   For drainage / throat tickle try take CHLORPHENIRAMINE  4 mg - take one every 4 hours as needed - available over the counter- may cause drowsiness so start with just a bedtime dose or two and see how you tolerate it before trying in daytime    For cough > Take delsym two tsp every 12 hours . Swallowing water or using ice chips/non mint and menthol containing candies (such as lifesavers or sugarless jolly ranchers) are also effective.  You should rest your voice and avoid activities that you know make you cough.    If not better please make appt and bring all meds with you  Late add: Minocycline 100 mg bid x 10 days since pcn allergic

## 2015-07-14 NOTE — Progress Notes (Signed)
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 Pulmonary Consult    self referred. Pt c/o of SOB with activity and rapid heart rate. Chest tightness/congestion, wheezing, and prod cough with yellow thick mucus.    acutely ill early May 2016 with scratchy throat/runny nose, loss voice, hacking cough to point of gagging and short of breath. Initially more night than day and now more day > night and does not wake her up. Already rx with multiple abx and steroids s benefit> started on saba and then problems with palpitations and hypokalemia.  Sob mostly when coughing. rec First take delsym two tsp every 12 hours and supplement if needed with  tramadol 50 mg up to 2 every 4 hours to suppress the urge to cough at all or even clear your throat. Swallowing water or using ice chips/non mint and menthol containing candies (such as lifesavers or sugarless jolly ranchers) are also effective.  You should rest your voice and avoid activities that you know make you cough. Once you have eliminated the cough for 3 straight days try reducing the tramadol first,  then the delsym as tolerated.   Prednisone 10 mg take  4 each am x 2 days,   2 each am x 2 days,  1 each am x 2 days and stop (this is to eliminate allergies and inflammation from coughing) Protonix (pantoprazole) Take 30-60 min before first meal of the day and Pepcid 20 mg one bedtime plus chlorpheniramine 4 mg x 2 at bedtime (both available over the counter)  until cough is completely gone for at  least a week without the need for cough suppression GERD diet.    07/14/2015  f/u ov/[facility] re: sensation of drainage for 13 years/ cough since May 2016 / not clear she's following recs esp re gerd rx  Chief Complaint  Patient presents with  . Follow-up    Reports cough is better but still coughing up clear-yellow phlem. Slight wheezing, slight SOB.   cough is worse 4pm and no noct cough  / seeping fine   No obvious day to day or daytime variability or assoc  cp or   overt sinus or hb symptoms. No unusual exp hx or h/o childhood pna/ asthma or knowledge of premature birth.  Sleeping ok without nocturnal  or early am exacerbation  of respiratory  c/o's or need for noct saba. Also denies any obvious fluctuation of symptoms with weather or environmental changes or other aggravating or alleviating factors except as outlined above   Current Medications, Allergies, Complete Past Medical History, Past Surgical History, Family History, and Social History were reviewed in [employer] record.  ROS  The following are not active complaints unless bolded sore throat, dysphagia, dental problems, itching, sneezing,  nasal congestion or excess/ purulent secretions, ear ache,   fever, chills, sweats, unintended wt loss, classically pleuritic or exertional cp, hemoptysis,  orthopnea pnd or leg swelling, presyncope, palpitations, abdominal pain, anorexia, nausea, vomiting, diarrhea  or change  in bowel or bladder habits, change in stools or urine, dysuria,hematuria,  rash, arthralgias, visual complaints, headache, numbness, weakness or ataxia or problems with walking or coordination,  change in mood/affect or memory.              Objective:   Physical Exam  amb bf  nad but freq throat clearing / no candy handy  07/14/2015        168   Wt Readings from Last 3 Encounters:  06/30/15 175 lb (79.379 kg)  06/16/15 178 lb (80.74 kg)  06/12/15 178 lb (80.74 kg)    Vital signs  reviewed    HEENT: nl dentition, turbinates, and orophanx. Nl external ear canals without cough reflex   NECK :  without JVD/Nodes/TM/ nl carotid upstrokes bilaterally   LUNGS: no acc muscle use, clear to A and P bilaterally without cough on insp or exp maneuvers   CV:  RRR  no s3 or murmur or increase in P2, no edema   ABD:  soft and nontender with nl excursion in the supine position. No bruits or organomegaly, bowel sounds nl  MS:  warm without deformities, calf tenderness, cyanosis or clubbing  SKIN: warm and dry without lesions    NEURO:  alert, approp, no deficits    I personally reviewed images and agree with radiology impression as follows:  CXR:  06/16/15 No active cardiopulmonary disease.           Assessment & Plan:

## 2015-07-16 ENCOUNTER — Ambulatory Visit: Payer: BC Managed Care – PPO | Admitting: Cardiovascular Disease

## 2015-07-16 ENCOUNTER — Ambulatory Visit (INDEPENDENT_AMBULATORY_CARE_PROVIDER_SITE_OTHER): Payer: BC Managed Care – PPO | Admitting: Gastroenterology

## 2015-07-16 ENCOUNTER — Encounter: Payer: Self-pay | Admitting: Gastroenterology

## 2015-07-16 VITALS — BP 150/100 | HR 84 | Ht 63.0 in | Wt 170.6 lb

## 2015-07-16 DIAGNOSIS — R1314 Dysphagia, pharyngoesophageal phase: Secondary | ICD-10-CM | POA: Diagnosis not present

## 2015-07-16 DIAGNOSIS — R05 Cough: Secondary | ICD-10-CM

## 2015-07-16 DIAGNOSIS — R1310 Dysphagia, unspecified: Secondary | ICD-10-CM

## 2015-07-16 DIAGNOSIS — K219 Gastro-esophageal reflux disease without esophagitis: Secondary | ICD-10-CM | POA: Diagnosis not present

## 2015-07-16 DIAGNOSIS — R059 Cough, unspecified: Secondary | ICD-10-CM

## 2015-07-16 NOTE — Progress Notes (Signed)
HPI :  44 y/o female seen in consultation for dysphagia/odynophagua for Dr. Ivar Bury.   Patient reports she has been dealing with dysphagia for about a week or so. She feels food can get caught in her throat when swallowing. She also has odynophagia with eating solids or liquids, and feels the discomfort in her throat, not her chest. After the initial hang up in her throat, she thinks the food will go down ok. No regurgitation otherwise. No choking or coughing when eating. No fevers. She does endorse a history of heartburn, with some burning in her lower chest, which can be related to eating. She thinks this can bother her frequently, multiple days per week. She was recently prescribed protonix but hasn't started it yet  She has otherwise has a chronic cough for which she has been seeking evaluation. She denies abdominal pain in general but had some left sided abdominal discomfort a few weeks ago in the setting of severe cough, and thought she had a muscle pull from coughing as this has since resolved. She previously was taking a lot of NSAIDs but has since stopped.    She was seen by ENT this morning for her symptoms who thought this may be due to tonsillitis and and was given a course of Cleocin. We don't have records of this visit to review today but she report she may need a tonsillectomy. She otherwise has had an ENT evaluation, having sinus CT to further evaluate cough. She has had some postnasal drip but thinks it is getting better. No prior laryngoscopy endorsed by the patient. She has also seen pulmonary for her cough, and reportedly has asthma.   She thinks maybe her father had colon cancer or liver cancer, and she is not sure of the details. He passed away from it < age 62. She has never had a prior colonoscopy. She denies any blood in the stools. She denies bowel habit changes.    Past Medical History  Diagnosis Date  . Normal spontaneous vaginal delivery     3  . Hypertension     . Asthma   . Allergy   . Cardiomegaly      Past Surgical History  Procedure Laterality Date  . Carpal tunnel release  2009  . Carpal tunnel release Left    Family History  Problem Relation Age of Onset  . Hypertension Mother   . Cancer Father     COLON  . Breast cancer Maternal Grandmother   . Hypertension Sister    Social History  Substance Use Topics  . Smoking status: Never Smoker   . Smokeless tobacco: Never Used  . Alcohol Use: 0.0 oz/week    0 Standard drinks or equivalent per week     Comment: OCC- WINE   Current Outpatient Prescriptions  Medication Sig Dispense Refill  . bisoprolol-hydrochlorothiazide (ZIAC) 2.5-6.25 MG per tablet Take 1 tablet by mouth daily. 30 tablet 11  . dextromethorphan (DELSYM) 30 MG/5ML liquid Take by mouth as needed for cough.    . medroxyPROGESTERone (DEPO-PROVERA) 150 MG/ML injection INJECT INTRAMUSCULARLY AS DIRECTED (Patient taking differently: INJECT INTRAMUSCULARLY AS DIRECTED EVERY 3 MONTHS) 1 mL 2  . potassium chloride SA (K-DUR,KLOR-CON) 20 MEQ tablet Take 1 tablet (20 mEq total) by mouth daily. 3 tablet 0   No current facility-administered medications for this visit.   Allergies  Allergen Reactions  . Penicillins Rash     Review of Systems: All systems reviewed and negative except where noted in  HPI.    Dg Chest 2 View  06/16/2015   CLINICAL DATA:  Mid chest pain 2 right-sided chest pain with productive cough and shortness of breath for 4 months  EXAM: CHEST  2 VIEW  COMPARISON:  06/13/2015  FINDINGS: Heart size upper normal and stable. Vascular pattern normal. Lungs clear. Bony thorax intact.  IMPRESSION: No active cardiopulmonary disease.   Electronically Signed   By: Skipper Cliche M.D.   On: 06/16/2015 21:10   Recent labs in Epic reviewed - normal Hgb and WBC   Physical Exam: BP 150/100 mmHg  Pulse 84  Ht 5\' 3"  (1.6 m)  Wt 170 lb 9.6 oz (77.384 kg)  BMI 30.23 kg/m2 Constitutional: Pleasant,well-developed,  African American female in no acute distress. HEENT: Normocephalic and atraumatic. Conjunctivae are normal. No scleral icterus. Enlarged tonsills noted  Neck supple.  Cardiovascular: Normal rate, regular rhythm.  Pulmonary/chest: Effort normal and breath sounds normal. No wheezing, rales or rhonchi. Abdominal: Soft, nondistended, nontender. Bowel sounds active throughout. There are no masses palpable. No hepatomegaly. Extremities: no edema Lymphadenopathy: No cervical adenopathy noted. Neurological: Alert and oriented to person place and time. Skin: Skin is warm and dry. No rashes noted. Psychiatric: Normal mood and affect. Behavior is normal.   ASSESSMENT AND PLAN: 44 y/o female with ongoing chronic cough, now with one week of dysphagia / odynophagia, also with history of reflux.   Symptoms of dysphagia and odynophagia seem mostly oropharyngeal and less likely esophageal based on history. Seen by ENT who has just prescribed a course of Cleocin for suspected tonsillitis and will see if this helps. I would agree with treating this first and see how she does. Regarding her heartburn, this seems quite active symptomatically at present time and could be related to her chronic cough. She was just given protonix but has not yet started taking it. Recommend she take 40mg  daily for 2 weeks and see how she does. If no improvement, I instructed her to increase protonix to 40mg  BID. If, following course of Cleocin / treatment for tonsillitis, and protonix for her GERD, her symptoms of heartburn, dysphagia, or odynophagia persist, then we will schedule her for upper endoscopy. She has our contact information and will contact us if she wants to schedule it, based on how she responds to therapy. Hopefully PPI will also help with her cough if reflux related.   Otherwise, she is 44 y/o and warrants a colonoscopy next May at age 57 for screening purposes. She has no lower symptoms, however has a ? history of colon  cancer in her father. When asked about the details of this she is not sure if he had colon cancer, he died of a malignancy and is not sure of the primary. I asked her to obtain this information if at all possible as if her father had colon cancer at a young age she is due for CRC screening now, and every 5 years thereafter unless she has polyps or findings that warrant intervals sooner. She agreed and will contact us to schedule a colonoscopy now if her father had colon cancer or in May if he did not.   Waelder Cellar, MD [facility]   CC: Dr. Ivar Bury.

## 2015-07-16 NOTE — Patient Instructions (Signed)
Thank you for choosing [facility] to care for you.  Call our office in one month for progress and to inform us if you have a  family history of Colon cancer.   We have sent medications to your pharmacy for you to pick up at your convenience.

## 2015-07-17 ENCOUNTER — Other Ambulatory Visit: Payer: BC Managed Care – PPO

## 2015-07-17 ENCOUNTER — Emergency Department (HOSPITAL_COMMUNITY)
Admission: EM | Admit: 2015-07-17 | Discharge: 2015-07-17 | Disposition: A | Discharge status: Home / Self Care | Payer: BC Managed Care – PPO | Attending: Emergency Medicine | Admitting: Emergency Medicine

## 2015-07-17 ENCOUNTER — Encounter (HOSPITAL_COMMUNITY): Payer: Self-pay | Admitting: Emergency Medicine

## 2015-07-17 ENCOUNTER — Emergency Department (HOSPITAL_COMMUNITY): Payer: BC Managed Care – PPO

## 2015-07-17 DIAGNOSIS — Z793 Long term (current) use of hormonal contraceptives: Secondary | ICD-10-CM | POA: Insufficient documentation

## 2015-07-17 DIAGNOSIS — J45909 Unspecified asthma, uncomplicated: Secondary | ICD-10-CM | POA: Insufficient documentation

## 2015-07-17 DIAGNOSIS — R61 Generalized hyperhidrosis: Secondary | ICD-10-CM | POA: Insufficient documentation

## 2015-07-17 DIAGNOSIS — Z3202 Encounter for pregnancy test, result negative: Secondary | ICD-10-CM | POA: Insufficient documentation

## 2015-07-17 DIAGNOSIS — Z7982 Long term (current) use of aspirin: Secondary | ICD-10-CM | POA: Diagnosis not present

## 2015-07-17 DIAGNOSIS — R55 Syncope and collapse: Secondary | ICD-10-CM | POA: Insufficient documentation

## 2015-07-17 DIAGNOSIS — R1013 Epigastric pain: Secondary | ICD-10-CM | POA: Diagnosis present

## 2015-07-17 DIAGNOSIS — K2970 Gastritis, unspecified, without bleeding: Secondary | ICD-10-CM

## 2015-07-17 DIAGNOSIS — M542 Cervicalgia: Secondary | ICD-10-CM | POA: Diagnosis not present

## 2015-07-17 DIAGNOSIS — N938 Other specified abnormal uterine and vaginal bleeding: Secondary | ICD-10-CM | POA: Insufficient documentation

## 2015-07-17 DIAGNOSIS — Z79899 Other long term (current) drug therapy: Secondary | ICD-10-CM | POA: Insufficient documentation

## 2015-07-17 DIAGNOSIS — Z880 Allergy status to penicillin: Secondary | ICD-10-CM | POA: Diagnosis not present

## 2015-07-17 DIAGNOSIS — I10 Essential (primary) hypertension: Secondary | ICD-10-CM | POA: Diagnosis not present

## 2015-07-17 LAB — CBC WITH DIFFERENTIAL/PLATELET
BASOS ABS: 0 10*3/uL (ref 0.0–0.1)
BASOS PCT: 0 %
EOS ABS: 0.1 10*3/uL (ref 0.0–0.7)
EOS PCT: 1 %
HCT: 39 % (ref 36.0–46.0)
Hemoglobin: 13.2 g/dL (ref 12.0–15.0)
Lymphocytes Relative: 8 %
Lymphs Abs: 0.7 10*3/uL (ref 0.7–4.0)
MCH: 30.1 pg (ref 26.0–34.0)
MCHC: 33.8 g/dL (ref 30.0–36.0)
MCV: 88.8 fL (ref 78.0–100.0)
Monocytes Absolute: 0.4 10*3/uL (ref 0.1–1.0)
Monocytes Relative: 5 %
Neutro Abs: 7.4 10*3/uL (ref 1.7–7.7)
Neutrophils Relative %: 86 %
PLATELETS: 234 10*3/uL (ref 150–400)
RBC: 4.39 MIL/uL (ref 3.87–5.11)
RDW: 12.5 % (ref 11.5–15.5)
WBC: 8.6 10*3/uL (ref 4.0–10.5)

## 2015-07-17 LAB — COMPREHENSIVE METABOLIC PANEL
ALT: 57 U/L — AB (ref 14–54)
AST: 96 U/L — AB (ref 15–41)
Albumin: 4 g/dL (ref 3.5–5.0)
Alkaline Phosphatase: 55 U/L (ref 38–126)
Anion gap: 9 (ref 5–15)
BUN: 8 mg/dL (ref 6–20)
CHLORIDE: 107 mmol/L (ref 101–111)
CO2: 22 mmol/L (ref 22–32)
CREATININE: 0.84 mg/dL (ref 0.44–1.00)
Calcium: 9.3 mg/dL (ref 8.9–10.3)
GFR calc Af Amer: >60 mL/min (ref >60)
GFR calc non Af Amer: >60 mL/min (ref >60)
Glucose, Bld: 91 mg/dL (ref 65–99)
Potassium: 3.6 mmol/L (ref 3.5–5.1)
SODIUM: 138 mmol/L (ref 135–145)
Total Bilirubin: 1.7 mg/dL — ABNORMAL HIGH (ref 0.3–1.2)
Total Protein: 6.8 g/dL (ref 6.5–8.1)

## 2015-07-17 LAB — URINALYSIS, ROUTINE W REFLEX MICROSCOPIC
BILIRUBIN URINE: NEGATIVE
Glucose, UA: NEGATIVE mg/dL
Hgb urine dipstick: NEGATIVE
Ketones, ur: NEGATIVE mg/dL
Leukocytes, UA: NEGATIVE
NITRITE: NEGATIVE
PROTEIN: NEGATIVE mg/dL
SPECIFIC GRAVITY, URINE: 1.02 (ref 1.005–1.030)
UROBILINOGEN UA: 1 mg/dL (ref 0.0–1.0)
pH: 6.5 (ref 5.0–8.0)

## 2015-07-17 LAB — PREGNANCY, URINE: PREG TEST UR: NEGATIVE

## 2015-07-17 LAB — I-STAT TROPONIN, ED: TROPONIN I, POC: 0 ng/mL (ref 0.00–0.08)

## 2015-07-17 LAB — LIPASE, BLOOD: Lipase: 30 U/L (ref 22–51)

## 2015-07-17 MED ORDER — ONDANSETRON 4 MG PO TBDP: ORAL_TABLET | ORAL | Status: DC

## 2015-07-17 MED ORDER — GI COCKTAIL ~~LOC~~
30.0000 mL | Freq: Once | ORAL | Status: AC
  Administered 2015-07-17: 30 mL via ORAL
  Filled 2015-07-17: qty 30

## 2015-07-17 MED ORDER — HYDROCODONE-ACETAMINOPHEN 5-325 MG PO TABS: 1.0000 | ORAL_TABLET | Freq: Four times a day (QID) | ORAL | Status: DC | PRN

## 2015-07-17 MED ORDER — MORPHINE SULFATE (PF) 4 MG/ML IV SOLN
4.0000 mg | Freq: Once | INTRAVENOUS | Status: AC
  Administered 2015-07-17: 4 mg via INTRAVENOUS
  Filled 2015-07-17: qty 1

## 2015-07-17 MED ORDER — PREDNISONE 20 MG PO TABS: 40.0000 mg | ORAL_TABLET | Freq: Every day | ORAL | Status: DC

## 2015-07-17 MED ORDER — ONDANSETRON HCL 4 MG/2ML IJ SOLN
4.0000 mg | Freq: Once | INTRAMUSCULAR | Status: AC
  Administered 2015-07-17: 4 mg via INTRAVENOUS
  Filled 2015-07-17: qty 2

## 2015-07-17 NOTE — ED Provider Notes (Signed)
CSN: [id number]     Arrival date & time 07/17/15  1005 History   First MD Initiated Contact with Patient 07/17/15 1011     Chief Complaint  Patient presents with  . Abdominal Pain     (Consider location/radiation/quality/duration/timing/severity/associated sxs/prior Treatment) The history is provided by the patient and medical records. No language interpreter was used.     TASHIA LEITERMAN is a 44 y.o. female  with a hx of HTN, asthma, cardiomegaly, GERD presents to the Emergency Department complaining of gradual, persistent, progressively worsening epigastric abd pain radiating into the low chest onset 5:30 this morning. Pt reports she took her double dose of medication this morning with resolution of pain.  She reports the pain returned about 9:00.  Pt reports she was sweating and she had right sided back pain as well prompting the EMS call.  Per EMS, pt was diaphoretic upon their arrival.  Associated symptoms include fever, chills, headache, neck pain, chest pain, SOB, vomiting, diarrhea, weakness, dizziness, syncope, dysuria, vaginal bleeding or discharge.  Last BM yesterday and normal.  Breathing and talking makes the pain worse.  Pt describes the pain as dull and achy, rated at a 10/10.  No hx of abdominal surgery.    Record review shows that pt was evaluated by GI yesterday who recommended doubling her GERD medication with reassessment in 1 month.  Pt also saw ENT yesterday who prescribed an abx for tonsillitis.  She has not yet begun taking these   Past Medical History  Diagnosis Date  . Normal spontaneous vaginal delivery     3  . Hypertension   . Asthma   . Allergy   . Cardiomegaly    Past Surgical History  Procedure Laterality Date  . Carpal tunnel release  2009  . Carpal tunnel release Left    Family History  Problem Relation Age of Onset  . Hypertension Mother   . Cancer Father     COLON  . Breast cancer Maternal Grandmother   . Hypertension Sister    Social  History  Substance Use Topics  . Smoking status: Never Smoker   . Smokeless tobacco: Never Used  . Alcohol Use: 0.0 oz/week    0 Standard drinks or equivalent per week     Comment: OCC- WINE   OB History    Gravida Para Term Preterm AB TAB SAB Ectopic Multiple Living   3 3 3       3      Review of Systems  Constitutional: Negative for fever, diaphoresis, appetite change, fatigue and unexpected weight change.  HENT: Negative for mouth sores.   Eyes: Negative for visual disturbance.  Respiratory: Negative for cough, chest tightness, shortness of breath and wheezing.   Cardiovascular: Negative for chest pain.  Gastrointestinal: Positive for nausea and abdominal pain. Negative for vomiting, diarrhea and constipation.  Endocrine: Negative for polydipsia, polyphagia and polyuria.  Genitourinary: Negative for dysuria, urgency, frequency and hematuria.  Musculoskeletal: Negative for back pain and neck stiffness.  Skin: Negative for rash.  Allergic/Immunologic: Negative for immunocompromised state.  Neurological: Negative for syncope, light-headedness and headaches.  Hematological: Does not bruise/bleed easily.  Psychiatric/Behavioral: Negative for sleep disturbance. The patient is not nervous/anxious.       Allergies  Penicillins  Home Medications   Prior to Admission medications   Medication Sig Start Date End Date Taking? Authorizing Provider  ASPIRIN BUFFERED PO Take 1 tablet by mouth once.   Yes Historical Provider, MD  bisoprolol (ZEBETA) 5  MG tablet Take 2.5 mg by mouth daily.   Yes Historical Provider, MD  Multiple Vitamin (MULTIVITAMIN WITH MINERALS) TABS tablet Take 1 tablet by mouth daily.   Yes Historical Provider, MD  potassium chloride SA (K-DUR,KLOR-CON) 20 MEQ tablet Take 1 tablet (20 mEq total) by mouth daily. 06/17/15  Yes Domenic Moras, PA-C  dextromethorphan (DELSYM) 30 MG/5ML liquid Take by mouth as needed for cough.    Historical Provider, MD   HYDROcodone-acetaminophen (NORCO/VICODIN) 5-325 MG tablet Take 1-2 tablets by mouth every 6 (six) hours as needed for moderate pain or severe pain. 07/17/15   Annaleigha Woo, PA-C  medroxyPROGESTERone (DEPO-PROVERA) 150 MG/ML injection INJECT INTRAMUSCULARLY AS DIRECTED 03/05/15   Terrance Mass, MD  ondansetron [facility] ODT) 4 MG disintegrating tablet 4mg  ODT q4 hours prn nausea/vomit 07/17/15   Luz Mares, PA-C   BP 133/83 mmHg  Pulse 82  Temp(Src) 98.6 F (37 C) (Oral)  Resp 20  Ht 5\' 3"  (1.6 m)  Wt 170 lb (77.111 kg)  BMI 30.12 kg/m2  SpO2 100% Physical Exam  Constitutional: She appears well-developed and well-nourished. No distress.  Awake, alert, nontoxic appearance  HENT:  Head: Normocephalic and atraumatic.  Mouth/Throat: Oropharynx is clear and moist. No oropharyngeal exudate.  Eyes: Conjunctivae are normal. No scleral icterus.  Neck: Normal range of motion. Neck supple.  Cardiovascular: Normal rate, regular rhythm, normal heart sounds and intact distal pulses.   Pulmonary/Chest: Effort normal and breath sounds normal. No respiratory distress. She has no wheezes.  Equal chest expansion  Abdominal: Soft. Bowel sounds are normal. She exhibits no distension and no mass. There is no hepatosplenomegaly. There is tenderness in the epigastric area. There is no rebound, no guarding and no CVA tenderness.  Mild epigastric tenderness without guarding or rebound No CVA tenderness  Musculoskeletal: Normal range of motion. She exhibits no edema.  Neurological: She is alert.  Speech is clear and goal oriented Moves extremities without ataxia  Skin: Skin is warm and dry. She is not diaphoretic.  Psychiatric: She has a normal mood and affect.  Nursing note and vitals reviewed.   ED Course  Procedures (including critical care time) Labs Review Labs Reviewed  COMPREHENSIVE METABOLIC PANEL - Abnormal; Notable for the following:    AST 96 (*)    ALT 57 (*)    Total  Bilirubin 1.7 (*)    All other components within normal limits  CBC WITH DIFFERENTIAL/PLATELET  LIPASE, BLOOD  URINALYSIS, ROUTINE W REFLEX MICROSCOPIC (NOT AT [facility])  PREGNANCY, URINE  I-STAT TROPOININ, ED    Imaging Review US Abdomen Complete  07/17/2015   CLINICAL DATA:  Epigastric region pain  EXAM: ULTRASOUND ABDOMEN COMPLETE  COMPARISON:  None.  FINDINGS: Gallbladder: No gallstones or wall thickening visualized. There is no pericholecystic fluid. No sonographic Murphy sign noted.  Common bile duct: Diameter: 3 mm. There is no intrahepatic, common hepatic, or common bile duct dilatation.  Liver: No focal lesion identified. Within normal limits in parenchymal echogenicity.  IVC: No abnormality visualized.  Pancreas: No mass or inflammatory focus.  Spleen: Size and appearance within normal limits.  Right Kidney: Length: 12.5 cm. Echogenicity within normal limits. No mass or hydronephrosis visualized.  Left Kidney: Length: 12.0 cm. Echogenicity within normal limits. No mass or hydronephrosis visualized.  Abdominal aorta: No aneurysm visualized.  Other findings: No demonstrable ascites.  IMPRESSION: Study within normal limits.   Electronically Signed   By: Lowella Grip III M.D.   On: 07/17/2015 11:58   Dg  Abd Acute W/chest  07/17/2015   CLINICAL DATA:  Right upper and left upper quadrant abdominal pain since this morning.  EXAM: DG ABDOMEN ACUTE W/ 1V CHEST  COMPARISON:  Chest x-ray 06/16/2015  FINDINGS: The upright chest x-ray demonstrates stable cardiomediastinal and hilar contours. No acute pulmonary findings. No pleural effusion. The bony thorax is intact.  Two views of the abdomen demonstrate an unremarkable bowel gas pattern. No findings for obstruction an or perforation. The soft tissue shadows are maintained. No worrisome calcifications. The bony structures are intact. Osteitis pubis noted.  IMPRESSION: No acute cardiopulmonary findings.  No plain film findings for acute abdominal process.    Electronically Signed   By: Marijo Sanes M.D.   On: 07/17/2015 12:17   I have personally reviewed and evaluated these images and lab results as part of my medical decision-making.   EKG Interpretation   Date/Time:  Thursday July 17 2015 10:10:42 EDT Ventricular Rate:  75 PR Interval:  154 QRS Duration: 87 QT Interval:  360 QTC Calculation: 402 R Axis:   67 Text Interpretation:  Sinus rhythm Borderline T abnormalities, inferior  leads No significant change since last tracing Confirmed by Mingo Amber  MD,  [place] ([id number]) on 07/17/2015 10:16:35 AM      MDM   Final diagnoses:  Epigastric abdominal pain  Gastritis   Kaetlin D Panico presents with a history of GERD and epigastric abdominal pain. Concern for possible pancreatitis versus cholecystitis versus gastritis. We'll begin workup.  Doubt small bowel obstruction though will obtain plain film for evaluation of possible perforated PUD.  11 AM EKG nonischemic, troponin negative, normal lipase, psychosis, negative pregnancy test and no evidence of urinary tract infection. Slightly elevated AST and ALT.  On repeat exam improved pain. Abdomen remains soft. No guarding.  12:20 PM  X-ray without evidence of pneumonia, pneumothorax or free air in the abdomen. The ultrasound has no evidence of gallstones or gallbladder wall thickening. No evidence of cholecystitis. Patient with complete resolution of symptoms after GI cocktail. Will discharge home with referral back to her gastroenterologist and encouragement to continue her reflux medications. Patient will also be given a short course of Zofran and pain control.  Doubt diverticulitis, appendicitis, colitis. Patient given strict return precautions including fevers, vomiting, diarrhea or other concerns.  BP 133/83 mmHg  Pulse 82  Temp(Src) 98.6 F (37 C) (Oral)  Resp 20  Ht 5\' 3"  (1.6 m)  Wt 170 lb (77.111 kg)  BMI 30.12 kg/m2  SpO2 100%  The patient was discussed with and seen by Dr.  Mingo Amber who agrees with the treatment plan.   Jarrett Soho Jomo Forand, PA-C 07/17/15 1544  Evelina Bucy, MD 07/17/15 [phone]

## 2015-07-17 NOTE — ED Notes (Signed)
Per EMS:  Martin Majestic to PCP provider for reflux problems yesterday, had a GERD medication doubled (unsure of what medication actually is), woke up this am with GERD and took her medications.  Symptoms disappears and returned at 0900 this am while at work.  Patient is now c/o sharp LUQ and RUQ abd pain. Most severe on RUQ where it is sharp and radiates to back.  Was diaphoretic when EMS arrived.  Was initially thought to be cp and so 324 asa was given by ems and a 12 lead was done, although this was unremarkable.  Patient denied n/v/d and had a normal BM this am but states she has felt gassy the last couple of days.     VSS, skin warm and dry, alert and oriented, and no acute distress noted.  Afebrile.

## 2015-07-17 NOTE — Discharge Instructions (Signed)
1. Medications: zofran, vicodin, usual home medications 2. Treatment: rest, drink plenty of fluids, advance diet slowly 3. Follow Up: Please followup with your primary doctor in 2 days for discussion of your diagnoses and further evaluation after today's visit; if you do not have a primary care doctor use the resource guide provided to find one; Please return to the ER for persistent vomiting, high fevers or worsening symptoms     Gastritis, Adult Gastritis is soreness and swelling (inflammation) of the lining of the stomach. Gastritis can develop as a sudden onset (acute) or long-term (chronic) condition. If gastritis is not treated, it can lead to stomach bleeding and ulcers. CAUSES  Gastritis occurs when the stomach lining is weak or damaged. Digestive juices from the stomach then inflame the weakened stomach lining. The stomach lining may be weak or damaged due to viral or bacterial infections. One common bacterial infection is the Helicobacter pylori infection. Gastritis can also result from excessive alcohol consumption, taking certain medicines, or having too much acid in the stomach.  SYMPTOMS  In some cases, there are no symptoms. When symptoms are present, they may include:  Pain or a burning sensation in the upper abdomen.  Nausea.  Vomiting.  An uncomfortable feeling of fullness after eating. DIAGNOSIS  Your caregiver may suspect you have gastritis based on your symptoms and a physical exam. To determine the cause of your gastritis, your caregiver may perform the following:  Blood or stool tests to check for the H pylori bacterium.  Gastroscopy. A thin, flexible tube (endoscope) is passed down the esophagus and into the stomach. The endoscope has a light and camera on the end. Your caregiver uses the endoscope to view the inside of the stomach.  Taking a tissue sample (biopsy) from the stomach to examine under a microscope. TREATMENT  Depending on the cause of your  gastritis, medicines may be prescribed. If you have a bacterial infection, such as an H pylori infection, antibiotics may be given. If your gastritis is caused by too much acid in the stomach, H2 blockers or antacids may be given. Your caregiver may recommend that you stop taking aspirin, ibuprofen, or other nonsteroidal anti-inflammatory drugs (NSAIDs). HOME CARE INSTRUCTIONS  Only take over-the-counter or prescription medicines as directed by your caregiver.  If you were given antibiotic medicines, take them as directed. Finish them even if you start to feel better.  Drink enough fluids to keep your urine clear or pale yellow.  Avoid foods and drinks that make your symptoms worse, such as:  Caffeine or alcoholic drinks.  Chocolate.  Peppermint or mint flavorings.  Garlic and onions.  Spicy foods.  Citrus fruits, such as oranges, lemons, or limes.  Tomato-based foods such as sauce, chili, salsa, and pizza.  Fried and fatty foods.  Eat small, frequent meals instead of large meals. SEEK IMMEDIATE MEDICAL CARE IF:   You have black or dark red stools.  You vomit blood or material that looks like coffee grounds.  You are unable to keep fluids down.  Your abdominal pain gets worse.  You have a fever.  You do not feel better after 1 week.  You have any other questions or concerns. MAKE SURE YOU:  Understand these instructions.  Will watch your condition.  Will get help right away if you are not doing well or get worse. Document Released: 09/28/2001 Document Revised: 04/04/2012 Document Reviewed: 11/17/2011 [facility] Patient Information 2015 [place], [place]. This information is not intended to replace advice given to  you by your health care provider. Make sure you discuss any questions you have with your health care provider.

## 2015-07-18 ENCOUNTER — Ambulatory Visit (INDEPENDENT_AMBULATORY_CARE_PROVIDER_SITE_OTHER)
Admission: RE | Admit: 2015-07-18 | Discharge: 2015-07-18 | Disposition: A | Discharge status: Home / Self Care | Payer: BC Managed Care – PPO | Source: Ambulatory Visit | Attending: Internal Medicine | Admitting: Internal Medicine

## 2015-07-18 ENCOUNTER — Telehealth: Payer: Self-pay | Admitting: *Deleted

## 2015-07-18 DIAGNOSIS — R945 Abnormal results of liver function studies: Secondary | ICD-10-CM

## 2015-07-18 DIAGNOSIS — R058 Other specified cough: Secondary | ICD-10-CM

## 2015-07-18 DIAGNOSIS — R7989 Other specified abnormal findings of blood chemistry: Secondary | ICD-10-CM

## 2015-07-18 DIAGNOSIS — R05 Cough: Secondary | ICD-10-CM

## 2015-07-18 MED ORDER — PANTOPRAZOLE SODIUM 40 MG PO TBEC: 40.0000 mg | DELAYED_RELEASE_TABLET | Freq: Two times a day (BID) | ORAL | Status: DC

## 2015-07-18 NOTE — Telephone Encounter (Signed)
Hi Regina,        I just saw this patient yesterday in clinic and got a message she was in the ER. I had asked her to double up on her PPI which she hadn't done. Otherwise her LFTs were slightly elevated in the ER but normal RUQ [place]. I would recommend we repeat the LFTs early next week if she is otherwise feeling well. Can you let her know to ensure she takes her medication as recommended and order labs for next week? Thanks        ----- Message -----     From: SYSTEM     Sent: 07/17/2015  1:43 PM      To: Manus Gunning, MD        Spoke with patient and she did not increase her PPI. New rx sent for patient. She will come for lab next week. Lab in EP[facility].

## 2015-07-18 NOTE — Assessment & Plan Note (Signed)
Adequate control on present rx, reviewed > no change in rx needed  = ziac 6.25/2.5 daily

## 2015-07-18 NOTE — Assessment & Plan Note (Signed)
-   d/c all inhalers 06/30/2015  - allergy profile 06/30/2015 >  Eos 0.1,   Ig E < 2/ neg RAST  - sinus CT 07/18/2015 >Minimal chronic changes of the right maxillary sinus. Complete opacification of the left maxillary sinus which could be acute or Chronic> rec  Minocycline 100 mg bid x 10 days since pcn allergic  Then ? ent eval next   Still no evidence at all for asthma and in setting of chronic pnds symptoms most likely this is related to chronic sinus dz with ? Secondary gerd from throat clearing so critical than he continue max gerd rx for now including trying to avoid throat clearing  I had an extended discussion with the patient reviewing all relevant studies completed to date and  lasting 15 to 20 minutes of a 25 minute visit    Each maintenance medication was reviewed in detail including most importantly the difference between maintenance and prns and under what circumstances the prns are to be triggered using an action plan format that is not reflected in the computer generated alphabetically organized AVS.    Please see instructions for details which were reviewed in writing and the patient given a copy highlighting the part that I personally wrote and discussed at today's ov.

## 2015-07-18 NOTE — Assessment & Plan Note (Signed)
Body mass index is 29.8   Lab Results  Component Value Date   TSH 0.696 11/14/2014     Contributing to gerd tendency/ doe/reviewed the need and the process to achieve and maintain neg calorie balance > defer f/u primary care including intermittently monitoring thyroid status

## 2015-07-21 ENCOUNTER — Other Ambulatory Visit: Payer: Self-pay | Admitting: Internal Medicine

## 2015-07-21 MED ORDER — MINOCYCLINE HCL 100 MG PO CAPS: 100.0000 mg | ORAL_CAPSULE | Freq: Two times a day (BID) | ORAL | Status: DC

## 2015-07-21 NOTE — Progress Notes (Signed)
Quick Note:  Spoke with pt and notified of results per Dr. Wert. Pt verbalized understanding and denied any questions.  ______ 

## 2015-07-23 ENCOUNTER — Emergency Department (INDEPENDENT_AMBULATORY_CARE_PROVIDER_SITE_OTHER)
Admission: EM | Admit: 2015-07-23 | Discharge: 2015-07-23 | Disposition: A | Discharge status: Home / Self Care | Payer: BC Managed Care – PPO | Source: Home / Self Care | Attending: Family Medicine | Admitting: Family Medicine

## 2015-07-23 ENCOUNTER — Encounter (HOSPITAL_COMMUNITY): Payer: Self-pay | Admitting: Family Medicine

## 2015-07-23 DIAGNOSIS — R109 Unspecified abdominal pain: Secondary | ICD-10-CM | POA: Diagnosis not present

## 2015-07-23 HISTORY — DX: Carpal tunnel syndrome, unspecified upper limb: G56.00

## 2015-07-23 LAB — POCT URINALYSIS DIP (DEVICE)
BILIRUBIN URINE: NEGATIVE
GLUCOSE, UA: NEGATIVE mg/dL
Hgb urine dipstick: NEGATIVE
Ketones, ur: NEGATIVE mg/dL
LEUKOCYTES UA: NEGATIVE
NITRITE: NEGATIVE
Protein, ur: NEGATIVE mg/dL
Specific Gravity, Urine: >=1.03 (ref 1.005–1.030)
Urobilinogen, UA: 0.2 mg/dL (ref 0.0–1.0)
pH: 6 (ref 5.0–8.0)

## 2015-07-23 LAB — POCT PREGNANCY, URINE: PREG TEST UR: NEGATIVE

## 2015-07-23 LAB — POCT H PYLORI SCREEN: H. PYLORI SCREEN, POC: NEGATIVE

## 2015-07-23 MED ORDER — RANITIDINE HCL 150 MG PO CAPS: 150.0000 mg | ORAL_CAPSULE | Freq: Two times a day (BID) | ORAL | Status: DC

## 2015-07-23 MED ORDER — GI COCKTAIL ~~LOC~~
ORAL | Status: AC
  Filled 2015-07-23: qty 30

## 2015-07-23 MED ORDER — GI COCKTAIL ~~LOC~~
30.0000 mL | Freq: Once | ORAL | Status: AC
  Administered 2015-07-23: 30 mL via ORAL

## 2015-07-23 MED ORDER — SUCRALFATE 1 GM/10ML PO SUSP: 1.0000 g | Freq: Three times a day (TID) | ORAL | Status: DC

## 2015-07-23 NOTE — ED Notes (Signed)
Right side abdominal pain.  Reports abdominal pain that was evaluated in ed, extensive evaluation.  Pain went away. Today pain has returned and more to the right.  No diarrhea, no nausea, no vomiting.

## 2015-07-23 NOTE — Discharge Instructions (Signed)
Because of your abdominal discomfort is not immediately clear. It is unlikely that this is related to your gallbladder, appendix, pancreas, kidneys, or uterus. There may be some involvement of your ovaries but this would require more extensive workup. This is also unlikely however given there is no evidence of ovarian involvement when you were seen in the emergency room. Your H. pylori test negative. Symptoms may be due in large part to overproduction of acid in her stomach or irritable bowel syndrome. Please continue your Protonix twice daily to start using Zantac as well as Carafate. Zantac will help to decrease the aspirin your stomach and Carafate will help coat your intestinal tract to protect it from discomfort. Please use 4-8 mg of Zofran every 8 hours for additional discomfort. Please start a probiotic once to twice daily for additional relief. Please follow-up with the gastroenterologist regarding the emergency room specifically if you get worse.

## 2015-07-23 NOTE — ED Provider Notes (Signed)
CSN: [id number]     Arrival date & time 07/23/15  1656 History   First MD Initiated Contact with Patient 07/23/15 1821     Chief Complaint  Patient presents with  . Abdominal Pain   (Consider location/radiation/quality/duration/timing/severity/associated sxs/prior Treatment) HPI   Abdominal discomfort. Started 4 days ago. Went to ED on day of onset  Started as generalized abdominal pain. Radiated up to epigastric region. ABD pain resolved the following day after GI cocktail and pain medicaitons.  Today pt awoke w/ RLQ pain and some upset stomach. Improves w/ protonix. Acute onset. Not related to food. Sometimes worse w/ extensive movement.  Bland diet w/o improvement.  No change in stool texture and frequency. Typically daily soft BMs.  LMP: unsure as on Depo. Last injection end of august.  Appt scheduled w/ GI first week of novemember.   Denies any dysuria, frequency, chest pain, shortness of breath, palpitations,, emesis, hematochezia, melena.  Past Medical History  Diagnosis Date  . Normal spontaneous vaginal delivery     3  . Hypertension   . Asthma   . Allergy   . Cardiomegaly   . Carpal tunnel syndrome    Past Surgical History  Procedure Laterality Date  . Carpal tunnel release  2009  . Carpal tunnel release Left    Family History  Problem Relation Age of Onset  . Hypertension Mother   . Cancer Father     COLON  . Breast cancer Maternal Grandmother   . Hypertension Sister    Social History  Substance Use Topics  . Smoking status: Never Smoker   . Smokeless tobacco: Never Used  . Alcohol Use: 0.0 oz/week    0 Standard drinks or equivalent per week     Comment: OCC- WINE   OB History    Gravida Para Term Preterm AB TAB SAB Ectopic Multiple Living   3 3 3       3      Review of Systems Per HPI with all other pertinent systems negative.   Allergies  Penicillins  Home Medications   Prior to Admission medications   Medication Sig Start Date End Date  Taking? Authorizing Provider  bisoprolol-hydrochlorothiazide (ZIAC) 2.5-6.25 MG tablet Take 1 tablet by mouth daily.   Yes Historical Provider, MD  ASPIRIN BUFFERED PO Take 1 tablet by mouth once.    Historical Provider, MD  bisoprolol (ZEBETA) 5 MG tablet Take 2.5 mg by mouth daily.    Historical Provider, MD  dextromethorphan (DELSYM) 30 MG/5ML liquid Take by mouth as needed for cough.    Historical Provider, MD  HYDROcodone-acetaminophen (NORCO/VICODIN) 5-325 MG tablet Take 1-2 tablets by mouth every 6 (six) hours as needed for moderate pain or severe pain. 07/17/15   Hannah Muthersbaugh, PA-C  medroxyPROGESTERone (DEPO-PROVERA) 150 MG/ML injection INJECT INTRAMUSCULARLY AS DIRECTED 03/05/15   Terrance Mass, MD  minocycline (MINOCIN) 100 MG capsule Take 1 capsule (100 mg total) by mouth 2 (two) times daily. 07/21/15   Tanda Rockers, MD  Multiple Vitamin (MULTIVITAMIN WITH MINERALS) TABS tablet Take 1 tablet by mouth daily.    Historical Provider, MD  ondansetron (ZOFRAN ODT) 4 MG disintegrating tablet 4mg  ODT q4 hours prn nausea/vomit 07/17/15   Hannah Muthersbaugh, PA-C  pantoprazole (PROTONIX) 40 MG tablet Take 1 tablet (40 mg total) by mouth 2 (two) times daily. 07/18/15   Manus Gunning, MD  potassium chloride SA (K-DUR,KLOR-CON) 20 MEQ tablet Take 1 tablet (20 mEq total) by mouth daily. 06/17/15  Domenic Moras, PA-C  ranitidine (ZANTAC) 150 MG capsule Take 1 capsule (150 mg total) by mouth 2 (two) times daily. 07/23/15   Waldemar Dickens, MD  sucralfate (CARAFATE) 1 GM/10ML suspension Take 10 mLs (1 g total) by mouth 4 (four) times daily -  with meals and at bedtime. 07/23/15   Waldemar Dickens, MD   Meds Ordered and Administered this Visit   Medications  gi cocktail (Maalox,Lidocaine,Donnatal) (30 mLs Oral Given 07/23/15 1844)    There were no vitals taken for this visit. No data found.   Physical Exam Physical Exam  Constitutional: oriented to person, place, and time. appears  well-developed and well-nourished. No distress.  HENT:  Head: Normocephalic and atraumatic.  Eyes: EOMI. PERRL.  Neck: Normal range of motion.  Cardiovascular: RRR, no m/r/g, 2+ distal pulses,  Pulmonary/Chest: Effort normal and breath sounds normal. No respiratory distress.  Abdominal: Soft, nontender to palpation, nondistended, hyperactive bowel sounds, negative Murphy sign, nontender at McBurney's point.  Musculoskeletal: Normal range of motion. Non ttp, no effusion.  Neurological: alert and oriented to person, place, and time.  Skin: Skin is warm. No rash noted. non diaphoretic.  Psychiatric: normal mood and affect. behavior is normal. Judgment and thought content normal.    ED Course  Procedures (including critical care time)  Labs Review Labs Reviewed  POCT URINALYSIS DIP (DEVICE)  POCT PREGNANCY, URINE  POCT H PYLORI SCREEN    Imaging Review No results found.   Visual Acuity Review  Right Eye Distance:   Left Eye Distance:   Bilateral Distance:    Right Eye Near:   Left Eye Near:    Bilateral Near:         MDM   1. Abdominal discomfort    Etiology not a medially clear. Unlikely related to pancreatitis, appendicitis, cholecystitis, nephrolithiasis. Labs and imaging reviewed from previous ED visit. Symptoms are possibly related to viral gastroenteritis versus irritable bowel syndrome versus GERD with reflux. Patient started probiotic, restart Zofran, start Zantac and Carafate. Patient follow-up with GI or go to the ED if worsens.    Waldemar Dickens, MD 07/23/15 [phone]

## 2015-07-24 ENCOUNTER — Telehealth: Payer: Self-pay | Admitting: Gastroenterology

## 2015-07-24 ENCOUNTER — Encounter (HOSPITAL_COMMUNITY): Payer: Self-pay | Admitting: *Deleted

## 2015-07-24 ENCOUNTER — Emergency Department (HOSPITAL_COMMUNITY): Payer: BC Managed Care – PPO

## 2015-07-24 ENCOUNTER — Emergency Department (HOSPITAL_COMMUNITY)
Admission: EM | Admit: 2015-07-24 | Discharge: 2015-07-24 | Disposition: A | Discharge status: Home / Self Care | Payer: BC Managed Care – PPO | Attending: Emergency Medicine | Admitting: Emergency Medicine

## 2015-07-24 DIAGNOSIS — I10 Essential (primary) hypertension: Secondary | ICD-10-CM | POA: Diagnosis not present

## 2015-07-24 DIAGNOSIS — Z793 Long term (current) use of hormonal contraceptives: Secondary | ICD-10-CM | POA: Diagnosis not present

## 2015-07-24 DIAGNOSIS — Z8669 Personal history of other diseases of the nervous system and sense organs: Secondary | ICD-10-CM | POA: Insufficient documentation

## 2015-07-24 DIAGNOSIS — J45909 Unspecified asthma, uncomplicated: Secondary | ICD-10-CM | POA: Diagnosis not present

## 2015-07-24 DIAGNOSIS — Z3202 Encounter for pregnancy test, result negative: Secondary | ICD-10-CM | POA: Diagnosis not present

## 2015-07-24 DIAGNOSIS — Z792 Long term (current) use of antibiotics: Secondary | ICD-10-CM | POA: Diagnosis not present

## 2015-07-24 DIAGNOSIS — Z880 Allergy status to penicillin: Secondary | ICD-10-CM | POA: Diagnosis not present

## 2015-07-24 DIAGNOSIS — Z79899 Other long term (current) drug therapy: Secondary | ICD-10-CM | POA: Insufficient documentation

## 2015-07-24 DIAGNOSIS — R1031 Right lower quadrant pain: Secondary | ICD-10-CM | POA: Insufficient documentation

## 2015-07-24 LAB — URINALYSIS, ROUTINE W REFLEX MICROSCOPIC
Bilirubin Urine: NEGATIVE
Glucose, UA: NEGATIVE mg/dL
Hgb urine dipstick: NEGATIVE
Ketones, ur: 40 mg/dL — AB
Leukocytes, UA: NEGATIVE
Nitrite: NEGATIVE
Protein, ur: NEGATIVE mg/dL
Specific Gravity, Urine: 1.027 (ref 1.005–1.030)
Urobilinogen, UA: 1 mg/dL (ref 0.0–1.0)
pH: 6 (ref 5.0–8.0)

## 2015-07-24 LAB — COMPREHENSIVE METABOLIC PANEL
ALT: 63 U/L — ABNORMAL HIGH (ref 14–54)
AST: 24 U/L (ref 15–41)
Albumin: 4.1 g/dL (ref 3.5–5.0)
Alkaline Phosphatase: 63 U/L (ref 38–126)
Anion gap: 11 (ref 5–15)
BUN: 11 mg/dL (ref 6–20)
CO2: 23 mmol/L (ref 22–32)
Calcium: 9.6 mg/dL (ref 8.9–10.3)
Chloride: 106 mmol/L (ref 101–111)
Creatinine, Ser: 0.83 mg/dL (ref 0.44–1.00)
GFR calc Af Amer: >60 mL/min (ref >60)
GFR calc non Af Amer: >60 mL/min (ref >60)
Glucose, Bld: 75 mg/dL (ref 65–99)
Potassium: 3.3 mmol/L — ABNORMAL LOW (ref 3.5–5.1)
Sodium: 140 mmol/L (ref 135–145)
Total Bilirubin: 0.8 mg/dL (ref 0.3–1.2)
Total Protein: 7.7 g/dL (ref 6.5–8.1)

## 2015-07-24 LAB — CBC
HCT: 37.9 % (ref 36.0–46.0)
Hemoglobin: 13 g/dL (ref 12.0–15.0)
MCH: 30.2 pg (ref 26.0–34.0)
MCHC: 34.3 g/dL (ref 30.0–36.0)
MCV: 87.9 fL (ref 78.0–100.0)
Platelets: 264 10*3/uL (ref 150–400)
RBC: 4.31 MIL/uL (ref 3.87–5.11)
RDW: 12.5 % (ref 11.5–15.5)
WBC: 4 10*3/uL (ref 4.0–10.5)

## 2015-07-24 LAB — POC URINE PREG, ED: Preg Test, Ur: NEGATIVE

## 2015-07-24 LAB — PREGNANCY, URINE: Preg Test, Ur: NEGATIVE

## 2015-07-24 MED ORDER — SODIUM CHLORIDE 0.9 % IV BOLUS (SEPSIS)
1000.0000 mL | Freq: Once | INTRAVENOUS | Status: AC
Start: 2015-07-24 — End: 2015-07-24
  Administered 2015-07-24: 1000 mL via INTRAVENOUS

## 2015-07-24 MED ORDER — KETOROLAC TROMETHAMINE 15 MG/ML IJ SOLN
15.0000 mg | Freq: Once | INTRAMUSCULAR | Status: AC
  Administered 2015-07-24: 15 mg via INTRAVENOUS
  Filled 2015-07-24: qty 1

## 2015-07-24 MED ORDER — IOHEXOL 300 MG/ML  SOLN
100.0000 mL | Freq: Once | INTRAMUSCULAR | Status: AC | PRN
  Administered 2015-07-24: 100 mL via INTRAVENOUS

## 2015-07-24 MED ORDER — OXYCODONE-ACETAMINOPHEN 5-325 MG PO TABS: 1.0000 | ORAL_TABLET | ORAL | Status: DC | PRN

## 2015-07-24 MED ORDER — IOHEXOL 300 MG/ML  SOLN: 25.0000 mL | Freq: Once | INTRAMUSCULAR | Status: DC | PRN

## 2015-07-24 MED ORDER — HYDROMORPHONE HCL 1 MG/ML IJ SOLN
0.7500 mg | Freq: Once | INTRAMUSCULAR | Status: AC
  Administered 2015-07-24: 0.75 mg via INTRAVENOUS
  Filled 2015-07-24: qty 1

## 2015-07-24 NOTE — ED Notes (Signed)
EDP at bedside explaining D/C

## 2015-07-24 NOTE — ED Notes (Signed)
Pt is here with abdominal pain that continues after being diagnosed with gastritis last Thursday.  Pt states now with right lower quad pain.  Pt is on anbx for sinus infection.  PT states when she took the anbx the pain went away and then came back

## 2015-07-24 NOTE — Discharge Instructions (Signed)

## 2015-07-24 NOTE — Telephone Encounter (Signed)
Left a message for patient to call back. 

## 2015-07-25 NOTE — Telephone Encounter (Signed)
Left a message for patient to call back. 

## 2015-07-28 ENCOUNTER — Telehealth: Payer: Self-pay | Admitting: Internal Medicine

## 2015-07-28 MED ORDER — LEVOFLOXACIN 500 MG PO TABS: 500.0000 mg | ORAL_TABLET | Freq: Every day | ORAL | Status: DC

## 2015-07-28 NOTE — Telephone Encounter (Signed)
Discuss her medications with nurse.

## 2015-07-28 NOTE — Telephone Encounter (Signed)
Spoke with patient and she is on an antibiotic that she is taking BID. States it is making her stomach hurt. States it is not nausea so she is not taking Zofran. She will call the MD that prescribed it and see if it can be changed .

## 2015-07-28 NOTE — Telephone Encounter (Signed)
Called spoke with pt. She has been on the minocycline x 5 days. Since she has been having stomach pains from this. Reports she has been taking this w/ food. Thinks she is allergic to this. please advise MW thanks

## 2015-07-28 NOTE — Telephone Encounter (Signed)
Pt is still waiting on weather to take her abx or not.  Please advise.

## 2015-07-28 NOTE — Telephone Encounter (Signed)
Spoke with pt and advised of Dr Gustavus Bryant recommendations.  Rx sent.  Pt verbalized understanding.

## 2015-07-28 NOTE — Telephone Encounter (Signed)
Left a message for patient to call back. 

## 2015-07-28 NOTE — Telephone Encounter (Signed)
D/c doxy and try levaquin 500 mg daily x5 more days and if not better see ENT

## 2015-07-29 ENCOUNTER — Telehealth: Payer: Self-pay | Admitting: Gastroenterology

## 2015-07-29 NOTE — Telephone Encounter (Signed)
Spoke with patient and she is still having burning in stomach. States her pulmonary MD changed her antibiotic yesterday. She has not started the Carafate listed on medications but states she is going to start it today. She also is letting us know she does not have a colon cancer history in her family.

## 2015-07-29 NOTE — Telephone Encounter (Signed)
Thanks for the update. If no colon cancer in the family she can wait until age 44 (next year) for her colonoscopy screening. Otherwise, let's she how she does on the antibiotics in regards to her tonsillitis. If treatment for this does not improve dysphagia and her reflux persists on PPI, then we can coordinate an EGD for her. PLease ask her to let us know if symptoms persist in a few weeks, and if so, will proceed with EGD. Thanks

## 2015-07-29 NOTE — Telephone Encounter (Signed)
Left a message for patient to call back. 

## 2015-07-29 NOTE — Telephone Encounter (Signed)
Spoke with patient and gave her recommendations. 

## 2015-07-30 NOTE — ED Notes (Signed)
Staff member transferring patient, did not obtain vital signs

## 2015-07-31 ENCOUNTER — Telehealth: Payer: Self-pay | Admitting: Gastroenterology

## 2015-07-31 NOTE — ED Provider Notes (Signed)
CSN: [id number]     Arrival date & time 07/24/15  1703 History   First MD Initiated Contact with Patient 07/24/15 1907     Chief Complaint  Patient presents with  . Abdominal Pain     (Consider location/radiation/quality/duration/timing/severity/associated sxs/prior Treatment) HPI   44yF with abdominal pain. Gradual onset several days ago. Pain initially. The likelihood in epic castrate. Diagnosis of possible gastritis. Pain has since migrated to the right lower quadrant. Worse with certain movements. No urinary complaints. No nausea or vomiting. No diarrhea. No unusual vaginal bleeding or discharge.  Past Medical History  Diagnosis Date  . Normal spontaneous vaginal delivery     3  . Hypertension   . Asthma   . Allergy   . Cardiomegaly   . Carpal tunnel syndrome    Past Surgical History  Procedure Laterality Date  . Carpal tunnel release  2009  . Carpal tunnel release Left    Family History  Problem Relation Age of Onset  . Hypertension Mother   . Cancer Father     COLON  . Breast cancer Maternal Grandmother   . Hypertension Sister    Social History  Substance Use Topics  . Smoking status: Never Smoker   . Smokeless tobacco: Never Used  . Alcohol Use: 0.0 oz/week    0 Standard drinks or equivalent per week     Comment: OCC- WINE   OB History    Gravida Para Term Preterm AB TAB SAB Ectopic Multiple Living   3 3 3       3      Review of Systems  All systems reviewed and negative, other than as noted in HPI.   Allergies  Minocin and Penicillins  Home Medications   Prior to Admission medications   Medication Sig Start Date End Date Taking? Authorizing Provider  bisoprolol-hydrochlorothiazide (ZIAC) 2.5-6.25 MG tablet Take 1 tablet by mouth daily.   Yes Historical Provider, MD  HYDROcodone-acetaminophen (NORCO/VICODIN) 5-325 MG tablet Take 1-2 tablets by mouth every 6 (six) hours as needed for moderate pain or severe pain. 07/17/15  Yes Hannah Muthersbaugh,  PA-C  medroxyPROGESTERone (DEPO-PROVERA) 150 MG/ML injection INJECT INTRAMUSCULARLY AS DIRECTED 03/05/15  Yes Terrance Mass, MD  Multiple Vitamin (MULTIVITAMIN WITH MINERALS) TABS tablet Take 1 tablet by mouth daily.   Yes Historical Provider, MD  ondansetron (ZOFRAN ODT) 4 MG disintegrating tablet 4mg  ODT q4 hours prn nausea/vomit 07/17/15  Yes Hannah Muthersbaugh, PA-C  pantoprazole (PROTONIX) 40 MG tablet Take 1 tablet (40 mg total) by mouth 2 (two) times daily. 07/18/15  Yes Manus Gunning, MD  potassium chloride SA (K-DUR,KLOR-CON) 20 MEQ tablet Take 1 tablet (20 mEq total) by mouth daily. 06/17/15  Yes Domenic Moras, PA-C  levofloxacin (LEVAQUIN) 500 MG tablet Take 1 tablet (500 mg total) by mouth daily. 07/28/15   Tanda Rockers, MD  oxyCODONE-acetaminophen (PERCOCET/ROXICET) 5-325 MG tablet Take 1 tablet by mouth every 4 (four) hours as needed for severe pain. 07/24/15   Virgel Manifold, MD  ranitidine (ZANTAC) 150 MG capsule Take 1 capsule (150 mg total) by mouth 2 (two) times daily. 07/23/15   Waldemar Dickens, MD  sucralfate (CARAFATE) 1 GM/10ML suspension Take 10 mLs (1 g total) by mouth 4 (four) times daily -  with meals and at bedtime. 07/23/15   Waldemar Dickens, MD   BP 155/84 mmHg  Pulse 95  Temp(Src) 98.8 F (37.1 C) (Oral)  Resp 14  SpO2 99% Physical Exam  Constitutional: She appears  well-developed and well-nourished. No distress.  HENT:  Head: Normocephalic and atraumatic.  Eyes: Conjunctivae are normal. Right eye exhibits no discharge. Left eye exhibits no discharge.  Neck: Neck supple.  Cardiovascular: Normal rate, regular rhythm and normal heart sounds.  Exam reveals no gallop and no friction rub.   No murmur heard. Pulmonary/Chest: Effort normal and breath sounds normal. No respiratory distress.  Abdominal: Soft. She exhibits no distension. There is tenderness.  Tenderness to palpation in the right lower quadrant & to a lesser degree suprapubically. No rebound or  guarding. No distention.  Musculoskeletal: She exhibits no edema or tenderness.  Neurological: She is alert.  Skin: Skin is warm and dry.  Psychiatric: She has a normal mood and affect. Her behavior is normal. Thought content normal.  Nursing note and vitals reviewed.   ED Course  Procedures (including critical care time) Labs Review Labs Reviewed  COMPREHENSIVE METABOLIC PANEL - Abnormal; Notable for the following:    Potassium 3.3 (*)    ALT 63 (*)    All other components within normal limits  URINALYSIS, ROUTINE W REFLEX MICROSCOPIC (NOT AT [facility]) - Abnormal; Notable for the following:    Ketones, ur 40 (*)    All other components within normal limits  CBC  PREGNANCY, URINE  POC URINE PREG, ED    Imaging Review No results found.   US Abdomen Complete  07/17/2015  CLINICAL DATA:  Epigastric region pain EXAM: ULTRASOUND ABDOMEN COMPLETE COMPARISON:  None. FINDINGS: Gallbladder: No gallstones or wall thickening visualized. There is no pericholecystic fluid. No sonographic Murphy sign noted. Common bile duct: Diameter: 3 mm. There is no intrahepatic, common hepatic, or common bile duct dilatation. Liver: No focal lesion identified. Within normal limits in parenchymal echogenicity. IVC: No abnormality visualized. Pancreas: No mass or inflammatory focus. Spleen: Size and appearance within normal limits. Right Kidney: Length: 12.5 cm. Echogenicity within normal limits. No mass or hydronephrosis visualized. Left Kidney: Length: 12.0 cm. Echogenicity within normal limits. No mass or hydronephrosis visualized. Abdominal aorta: No aneurysm visualized. Other findings: No demonstrable ascites. IMPRESSION: Study within normal limits. Electronically Signed   By: Lowella Grip III M.D.   On: 07/17/2015 11:58   Ct Abdomen Pelvis W Contrast  07/24/2015  CLINICAL DATA:  Rt lower abd pain. Diagnosed with gastritis last Thursday. 174ml omni 300^137mL OMNIPAQUE IOHEXOL 300 MG/ML SOLN EXAM: CT ABDOMEN  AND PELVIS WITH CONTRAST TECHNIQUE: Multidetector CT imaging of the abdomen and pelvis was performed using the standard protocol following bolus administration of intravenous contrast. CONTRAST:  115mL OMNIPAQUE IOHEXOL 300 MG/ML  SOLN COMPARISON:  Ultrasound 07/17/2015 FINDINGS: Minimal dependent atelectasis posteriorly in the visualized lung bases. Probable benign cysts in hepatic segments 8 and 4. Unremarkable gallbladder, spleen, adrenal glands, pancreas, kidneys. Normal enhancement. No hydronephrosis. Stomach, small bowel, and colon are nondilated. Normal appendix. Uterus and adnexal regions unremarkable. Urinary bladder incompletely distended. No ascites. No free air. No adenopathy. Bilateral pelvic phleboliths. Disc protrusions L2-S1. Facet DJD L4-S1. IMPRESSION: 1. No acute abdominal process. 2. Lumbar degenerative changes as above. Electronically Signed   By: Lucrezia Europe M.D.   On: 07/24/2015 21:02   Dg Abd Acute W/chest  07/17/2015  CLINICAL DATA:  Right upper and left upper quadrant abdominal pain since this morning. EXAM: DG ABDOMEN ACUTE W/ 1V CHEST COMPARISON:  Chest x-ray 06/16/2015 FINDINGS: The upright chest x-ray demonstrates stable cardiomediastinal and hilar contours. No acute pulmonary findings. No pleural effusion. The bony thorax is intact. Two views of the abdomen  demonstrate an unremarkable bowel gas pattern. No findings for obstruction an or perforation. The soft tissue shadows are maintained. No worrisome calcifications. The bony structures are intact. Osteitis pubis noted. IMPRESSION: No acute cardiopulmonary findings. No plain film findings for acute abdominal process. Electronically Signed   By: Marijo Sanes M.D.   On: 07/17/2015 12:17   [place] Cm  07/18/2015  CLINICAL DATA:  Upper airway cough syndrome. Chronic productive cough. EXAM: CT PARANASAL SINUS LIMITED WITHOUT CONTRAST TECHNIQUE: Non-contiguous multidetector CT images of the paranasal sinuses were  obtained in a single plane without contrast. COMPARISON:  None. FINDINGS: There is complete opacification of the left maxillary sinus. Minimal mucosal thickening in the base of the right maxillary sinus. The small frontal sinus and the ethmoid and sphenoid sinuses are clear. Slight nasal septal deviation from right to left. IMPRESSION: Minimal chronic changes of the right maxillary sinus. Complete opacification of the left maxillary sinus which could be acute or chronic. Electronically Signed   By: Lorriane Shire M.D.   On: 07/18/2015 13:24   I have personally reviewed and evaluated these images and lab results as part of my medical decision-making.   EKG Interpretation None      MDM   Final diagnoses:  Right lower quadrant abdominal pain    44 year old female with several day history of continued abdominal pain. Unclear etiology. Workup today is fairly unremarkable. CT abdomen and pelvis does not show any acute abnormality. She is afebrile, HD stable and generally well-appearing. It has been determined that no acute conditions requiring further emergency intervention are present at this time. The patient has been advised of the diagnosis and plan. I reviewed any labs and imaging including any potential incidental findings. We have discussed signs and symptoms that warrant return to the ED and they are listed in the discharge instructions.      Virgel Manifold, MD 07/31/15 [phone]

## 2015-07-31 NOTE — Telephone Encounter (Signed)
Spoke with patient and she went to ENT and was told to complete antibiotics. He told her that the "bottom of esophagus was inflamed."  She will complete the antibiotics to see if it helps and will call us back if she does not have relief of symptoms.

## 2015-07-31 NOTE — Telephone Encounter (Signed)
Agree. Would wait for her to complete antibiotics for tonsillitis to see how she responds. She is on protonix twice daily, please confirm she is taking this. She has not had an endoscopy so unclear how she was told she had esophagitis. If her symptoms persist following treatment with antibiotics we will schedule EGD. Thanks

## 2015-08-01 NOTE — Telephone Encounter (Signed)
Patient is taking Protonix BID and Carafate.

## 2015-08-04 NOTE — Progress Notes (Signed)
This encounter was created in error - please disregard.

## 2015-08-05 ENCOUNTER — Encounter: Payer: BC Managed Care – PPO | Admitting: Cardiovascular Disease

## 2015-08-09 NOTE — Progress Notes (Signed)
  Medical screening examination/treatment/procedure(s) were performed by non-physician practitioner and as supervising physician I was immediately available for consultation/collaboration.     

## 2015-08-14 ENCOUNTER — Telehealth: Payer: Self-pay | Admitting: Internal Medicine

## 2015-08-14 MED ORDER — METRONIDAZOLE 500 MG PO TABS: 500.0000 mg | ORAL_TABLET | Freq: Three times a day (TID) | ORAL | Status: DC

## 2015-08-14 NOTE — Telephone Encounter (Signed)
Pt returning call and can be reached [phone].Megan Lang

## 2015-08-14 NOTE — Telephone Encounter (Signed)
LMTC x 1  

## 2015-08-14 NOTE — Telephone Encounter (Signed)
Spoke with pt. She finished levaquin about 2 weeks ago. She is still having stomach cramps from the ABX and reports she had some vision changes as well. She saw eye doc and was told everything looked fine and to f/u with [place] since we RX'd the ABX. Pt wants to know if even though she is done with the ABX, if it would still be causing side effects? Please advise thanks

## 2015-08-14 NOTE — Telephone Encounter (Signed)
Can cause diarrhea even after a couple of week and if this is the case would try flagyl 500 mg tid x 7 days (not etoh while on it)

## 2015-08-14 NOTE — Telephone Encounter (Signed)
Spoke with pt and advised of Dr Gustavus Bryant recommendation.  Rx sent to pharmacy.

## 2015-08-14 NOTE — Telephone Encounter (Signed)
Patient Returned call [phone]

## 2015-08-14 NOTE — Telephone Encounter (Signed)
lmtcb for pt.  

## 2015-08-19 ENCOUNTER — Telehealth: Payer: Self-pay | Admitting: Gastroenterology

## 2015-08-19 ENCOUNTER — Telehealth: Payer: Self-pay | Admitting: Internal Medicine

## 2015-08-19 NOTE — Telephone Encounter (Signed)
Patient notified of Dr. Gustavus Bryant recommendations.  Patient scheduled to see Dr. Melvyn Novas in 2 weeks and advised to bring all medications with her when she comes.  Nothing further needed. Closing encounter

## 2015-08-19 NOTE — Telephone Encounter (Signed)
Patient states that Protonix is causing her to have dry eyes, itchy eyes, swollen eyelids.  Patient says she has been to eye doctor 3 times and they advised her to check with Dr. Melvyn Novas to see if he can change her medication for GERD.    Pharmacy: [facility]

## 2015-08-19 NOTE — Telephone Encounter (Signed)
Ok to try pepcid 20 mg after bfast daily but be sure has f/u ov with me or tammy NP in 2 weeks to be sure this stategy will work and at this ov bring all active meds

## 2015-08-19 NOTE — Telephone Encounter (Signed)
Spoke with patient and she is asking if Protonix will help with bloating and gas. Reviewed Protonix with patient. She will also try Gas X or simethicone.

## 2015-08-26 ENCOUNTER — Encounter: Payer: Self-pay | Admitting: Gynecology

## 2015-08-26 ENCOUNTER — Ambulatory Visit: Payer: BC Managed Care – PPO | Admitting: Gastroenterology

## 2015-08-26 ENCOUNTER — Ambulatory Visit (INDEPENDENT_AMBULATORY_CARE_PROVIDER_SITE_OTHER): Payer: BC Managed Care – PPO | Admitting: Gynecology

## 2015-08-26 VITALS — BP 138/90

## 2015-08-26 DIAGNOSIS — Z3009 Encounter for other general counseling and advice on contraception: Secondary | ICD-10-CM | POA: Diagnosis not present

## 2015-08-26 DIAGNOSIS — M940 Chondrocostal junction syndrome [Tietze]: Secondary | ICD-10-CM | POA: Insufficient documentation

## 2015-08-26 MED ORDER — IBUPROFEN 800 MG PO TABS: ORAL_TABLET | ORAL | Status: DC

## 2015-08-26 MED ORDER — PREDNISONE 10 MG (21) PO TBPK: 10.0000 mg | ORAL_TABLET | Freq: Every day | ORAL | Status: DC

## 2015-08-26 NOTE — Progress Notes (Signed)
   Patient is a 44 year old who presented to the office today complaining of 2 day history of mid anterior chest wall tenderness when she was examining herself. It was questionable whether she felt a lump or not. She denied any nipple discharge. She denied any recent trauma. Patient had a normal mammogram in March of this year. Patient's grandmother had breast cancer. Patient denies any fever, chills, nausea, vomiting. She is on the Depo-Provera injectable contraception for which she is due for her next injection at the end of the month.   Both breasts were examined sitting supine position both breasts appear to be symmetrical in appearance no skin discoloration or no nipple inversion no supraclavicular axillary lymphadenopathy patient was exquisitely tender right parasternal border slightly underneath the clavicle.   Physical Exam  Pulmonary/Chest:     assessment/plan: Patient appears to have costochondritis. She will be placed on a prednisone (Sterapred uni-pack 21 tablet) 10 mg to take over the next 6 days as an anti-inflammatory. Also for discomfort and inflammation she will take Motrin 800 mg 3 times a day with meals. She'll return back to the office at the end of next week for follow-up exam at which time she'll be due for her next Depo-Provera injection.

## 2015-08-26 NOTE — Patient Instructions (Signed)

## 2015-08-28 ENCOUNTER — Ambulatory Visit: Payer: BC Managed Care – PPO | Admitting: Gynecology

## 2015-08-29 ENCOUNTER — Ambulatory Visit: Payer: BC Managed Care – PPO

## 2015-09-02 ENCOUNTER — Ambulatory Visit: Payer: BC Managed Care – PPO | Admitting: Gastroenterology

## 2015-09-03 ENCOUNTER — Ambulatory Visit (INDEPENDENT_AMBULATORY_CARE_PROVIDER_SITE_OTHER): Payer: BC Managed Care – PPO | Admitting: Internal Medicine

## 2015-09-03 ENCOUNTER — Encounter: Payer: Self-pay | Admitting: Internal Medicine

## 2015-09-03 VITALS — BP 136/68 | HR 81 | Ht 63.0 in | Wt 160.4 lb

## 2015-09-03 DIAGNOSIS — R05 Cough: Secondary | ICD-10-CM

## 2015-09-03 DIAGNOSIS — R058 Other specified cough: Secondary | ICD-10-CM

## 2015-09-03 NOTE — Assessment & Plan Note (Signed)
-   d/c all inhalers 06/30/2015  - allergy profile 06/30/2015 >  Eos 0.1,   Ig E < 2/ neg RAST  - sinus CT 07/18/2015 >Minimal chronic changes of the right maxillary sinus. Complete opacification of the left maxillary sinus which could be acute or Chronic> rec  Minocycline 100 mg bid x 10 days since pcn allergic  Then   ent eval > shoemaker eval 07/22/15 rec septoplasty  Suspect the pnds makes her cough which makes her reflux which makes her cough worse but should be able to control p sinus surgery with just the 1st gen h1 ( or whatever Dr Wilburn Cornelia recommends) s need for pulmonary rx or f/u and eventually should be able to taper off gerd rx as well   I had an extended final summary discussion with the patient reviewing all relevant studies completed to date and  lasting 10    minutes of a 15 minute visit    Each maintenance medication was reviewed in detail including most importantly the difference between maintenance and as needed and under what circumstances the prns are to be used.  Please see instructions for details which were reviewed in writing and the patient given a copy.

## 2015-09-03 NOTE — Patient Instructions (Signed)
Keep all appts with Dr Wilburn Cornelia and return her if breathing / coughing bothering you in the absence of an obvious sinus flare

## 2015-09-03 NOTE — Progress Notes (Signed)
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 Pulmonary Consult    self referred. Pt c/o of SOB with activity and rapid heart rate. Chest tightness/congestion, wheezing, and prod cough with yellow thick mucus.    acutely ill early May 2016 with scratchy throat/runny nose, loss voice, hacking cough to point of gagging and short of breath. Initially more night than day and now more day > night and does not wake her up. Already rx with multiple abx and steroids s benefit> started on saba and then problems with palpitations and hypokalemia.  Sob mostly when coughing. rec First take delsym two tsp every 12 hours and supplement if needed with  tramadol 50 mg up to 2 every 4 hours to suppress the urge to cough at all or even clear your throat. Swallowing water or using ice chips/non mint and menthol containing candies (such as lifesavers or sugarless jolly ranchers) are also effective.  You should rest your voice and avoid activities that you know make you cough. Once you have eliminated the cough for 3 straight days try reducing the tramadol first,  then the delsym as tolerated.   Prednisone 10 mg take  4 each am x 2 days,   2 each am x 2 days,  1 each am x 2 days and stop (this is to eliminate allergies and inflammation from coughing) Protonix (pantoprazole) Take 30-60 min before first meal of the day and Pepcid 20 mg one bedtime plus chlorpheniramine 4 mg x 2 at bedtime (both available over the counter)  until cough is completely gone for at  least a week without the need for cough suppression GERD diet.    07/14/2015  f/u ov/Megan Lang re: sensation of drainage for 13 years/ cough since May 2016 / not clear she's following recs esp re gerd rx  Chief Complaint  Patient presents with  . Follow-up    Reports cough is better but still coughing up clear-yellow phlem. Slight wheezing, slight SOB.   cough is worse 4pm and no noct cough  / seeping fine  rec Please see patient coordinator before you leave today  to schedule sinus CT  For drainage / throat tickle try take CHLORPHENIRAMINE  4 mg - take one every 4 hours as needed - available over the counter- may cause drowsiness so start with just a bedtime dose or two and see how you tolerate it before trying in daytime   For cough > Take delsym two tsp every 12 hours .  If not better please make appt and bring all meds with you  Late add: Minocycline 100 mg bid x 10 days since pcn allergic      09/03/2015  f/u ov/Megan Lang re: severe uacs from sinus dz > for septoplasty before xmas/ no inhalers/ h1 works the best for sensation of pnds  Chief Complaint  Patient presents with  . Follow-up    GERD. pt. states breathing has improved. occ SOB. denies wheezing, cough or chest pain/tightness  really  Not limited by breathing from desired activities      No obvious day to day or daytime variability or assoc cough or   cp or   overt   hb symptoms. No unusual exp hx or h/o childhood pna/ asthma or knowledge of premature birth.  Sleeping ok without nocturnal  or early am exacerbation  of respiratory  c/o's or need for noct saba. Also denies any obvious fluctuation of symptoms with weather or environmental changes or other aggravating or alleviating factors except as outlined above   Current Medications, Allergies, Complete Past Medical History, Past Surgical History, Family History, and Social History were reviewed in [employer] record.  ROS  The following are not active  complaints unless bolded sore throat, dysphagia, dental problems, itching, sneezing,  nasal congestion or excess/ purulent secretions, ear ache,   fever, chills, sweats, unintended wt loss, classically pleuritic or exertional cp, hemoptysis,  orthopnea pnd or leg swelling, presyncope, palpitations, abdominal pain, anorexia, nausea, vomiting, diarrhea  or change in bowel or bladder habits, change in stools or urine, dysuria,hematuria,  rash, arthralgias, visual complaints, headache, numbness, weakness or ataxia or problems with walking or coordination,  change in mood/affect or memory.              Objective:   Physical Exam  amb bf  nad slt hoarse but no excess throat clearing   07/14/2015        168  > 09/03/2015    160     06/30/15 175 lb (79.379 kg)  06/16/15 178 lb (80.74 kg)  06/12/15 178 lb (80.74 kg)    Vital signs reviewed    HEENT: nl dentition,  and orophanx. Nl external ear canals without cough reflex - mod/severe  non-specific turbinate edema L>R    NECK :  without JVD/Nodes/TM/ nl carotid upstrokes bilaterally   LUNGS: no acc muscle use, clear to A and P bilaterally without cough on insp or exp maneuvers   CV:  RRR  no s3 or murmur or increase in P2, no edema   ABD:  soft and nontender with nl excursion in the supine position. No bruits or organomegaly, bowel sounds nl  MS:  warm without deformities, calf tenderness, cyanosis or clubbing  SKIN: warm and dry without lesions    NEURO:  alert, approp, no deficits    I personally reviewed images and agree with radiology impression as follows:  CXR:  06/16/15 No active cardiopulmonary disease.           Assessment & Plan:

## 2015-09-05 ENCOUNTER — Ambulatory Visit (INDEPENDENT_AMBULATORY_CARE_PROVIDER_SITE_OTHER): Payer: BC Managed Care – PPO | Admitting: Gynecology

## 2015-09-05 ENCOUNTER — Encounter: Payer: Self-pay | Admitting: Gynecology

## 2015-09-05 ENCOUNTER — Ambulatory Visit: Payer: BC Managed Care – PPO | Admitting: Gynecology

## 2015-09-05 VITALS — BP 130/82

## 2015-09-05 DIAGNOSIS — M940 Chondrocostal junction syndrome [Tietze]: Secondary | ICD-10-CM

## 2015-09-05 DIAGNOSIS — Z3049 Encounter for surveillance of other contraceptives: Secondary | ICD-10-CM | POA: Diagnosis not present

## 2015-09-05 MED ORDER — MEDROXYPROGESTERONE ACETATE 150 MG/ML IM SUSP
150.0000 mg | Freq: Once | INTRAMUSCULAR | Status: AC
  Administered 2015-09-05: 150 mg via INTRAMUSCULAR

## 2015-09-05 NOTE — Progress Notes (Signed)
   Patient is a 44 year old was seen the office on 08/26/2015 who was suffering from acute costochondritis and was placed on a prednisone pack along with Motrin for 7 days symptoms have her's office. She is here for follow-up exam as well as for her Depo-Provera injection which was due.  Exam: Both breasts were examined sitting supine position. Both breasts were symmetrical in appearance no skin discoloration or nipple inversion no palpable masses or tenderness no supraclavicular axillary lymphadenopathy  Assessment/plan: Patient status post treatment for acute costochondritis doing well. She is in the process of having surgery on what appears to be nasal polyps with Dr. Valetta Mole in the next few weeks. Patient otherwise scheduled to see me for her annual exam in February of next year. Patient received her Depo-Provera injection today.

## 2015-10-02 ENCOUNTER — Other Ambulatory Visit: Payer: Self-pay | Admitting: Otolaryngology

## 2015-10-03 ENCOUNTER — Encounter: Payer: Self-pay | Admitting: Family Medicine

## 2015-10-03 ENCOUNTER — Ambulatory Visit (INDEPENDENT_AMBULATORY_CARE_PROVIDER_SITE_OTHER): Payer: BC Managed Care – PPO | Admitting: Family Medicine

## 2015-10-03 ENCOUNTER — Ambulatory Visit (INDEPENDENT_AMBULATORY_CARE_PROVIDER_SITE_OTHER): Payer: BC Managed Care – PPO

## 2015-10-03 VITALS — BP 150/98 | HR 84 | Temp 99.2°F | Resp 20 | Ht 62.25 in | Wt 161.0 lb

## 2015-10-03 DIAGNOSIS — I10 Essential (primary) hypertension: Secondary | ICD-10-CM

## 2015-10-03 DIAGNOSIS — R079 Chest pain, unspecified: Secondary | ICD-10-CM | POA: Diagnosis not present

## 2015-10-03 DIAGNOSIS — R0602 Shortness of breath: Secondary | ICD-10-CM

## 2015-10-03 MED ORDER — VALACYCLOVIR HCL 1 G PO TABS: 1000.0000 mg | ORAL_TABLET | Freq: Three times a day (TID) | ORAL | Status: DC

## 2015-10-03 NOTE — Patient Instructions (Addendum)
Keep an eye on your temperature and let us know if you have any fever over 100- 101.   Check your BP at the drug store a few times over the next couple of weeks and let me know if you are running higher than 140/90 I gave you a course of valtrex in case you are getting shingles again.    If you have any severe chest pain or shortness of breath seek care right away

## 2015-10-03 NOTE — Progress Notes (Signed)
[facility] [address] [place] [address] [phone]  Date:  10/03/2015   Name:  Megan Lang   DOB:  04/15/1971   MRN:  [id number]  PCP:  Lamar Blinks, MD    Chief Complaint: Shortness of Breath; Chest Pain; and Hypertension   History of Present Illness:  Megan Lang is a 44 y.o. very pleasant female patient who presents with the following:  Generally healthy person here today with complaint of feeling SOB.  She cannot breathe through her nose due to sinus surgery. She is not sure if this is why she is feeling SOB or if it is something else.    She also notes a pain in her left chest, ribs and back that has been present off an on since she had shingles in this distribution 3 years ago- this is "getting worse and worse."    She does not have any cardiac history, she is a never smoker She had sinus surgery yesterday-  They removed a blockage in her sinuses and fixed a deviated septum.  She is wearing a bandage the covers her nares and supports her nose  The surgery was done at the surgical center- per pts understanding she was intubated under general anesthesia.     History of HTN- she is taking her medication regularly per her report  She is on abx from her recent sinus surgery - clindamycin.  She is taking this TID.   She is on depo-provera and her shot is UTD She had not been aware of any fever at home but noted to have a minimal temp here  BP Readings from Last 3 Encounters:  10/03/15 168/102  09/05/15 130/82  09/03/15 136/68     Patient Active Problem List   Diagnosis Date Noted  . Costochondritis 08/26/2015  . Obesity 07/01/2015  . Upper airway cough syndrome 06/30/2015  . History of shingles 01/26/2015  . Family history of colon cancer 10/27/2011  . Essential hypertension 10/27/2011    Past Medical History  Diagnosis Date  . Normal spontaneous vaginal delivery     3  . Hypertension   . Asthma   . Allergy   .  Cardiomegaly   . Carpal tunnel syndrome     Past Surgical History  Procedure Laterality Date  . Carpal tunnel release  2009  . Carpal tunnel release Left     Social History  Substance Use Topics  . Smoking status: Never Smoker   . Smokeless tobacco: Never Used  . Alcohol Use: 0.0 oz/week    0 Standard drinks or equivalent per week     Comment: OCC- WINE    Family History  Problem Relation Age of Onset  . Hypertension Mother   . Cancer Father     COLON  . Breast cancer Maternal Grandmother   . Hypertension Sister     Allergies  Allergen Reactions  . Minocin [Minocycline Hcl] Other (See Comments)    Stomach Pains  . Penicillins Rash    Has patient had a PCN reaction causing immediate rash, facial/tongue/throat swelling, SOB or lightheadedness with hypotension: Noyes Has patient had a PCN reaction causing severe rash involving mucus membranes or skin necrosis: Nono Has patient had a PCN reaction that required hospitalization Nono Has patient had a PCN reaction occurring within the last 10 years: Nono If all of the above answers are "NO", then may proceed with Cephalosporin    Medication list has been reviewed and updated.  Current Outpatient Prescriptions on File Prior to Visit  Medication Sig Dispense Refill  . bisoprolol-hydrochlorothiazide (ZIAC) 2.5-6.25 MG tablet Take 1 tablet by mouth daily.    Marland Kitchen HYDROcodone-acetaminophen (NORCO/VICODIN) 5-325 MG tablet Take 1-2 tablets by mouth every 6 (six) hours as needed for moderate pain or severe pain. (Patient not taking: Reported on 09/05/2015) 15 tablet 0  . ibuprofen (ADVIL,MOTRIN) 800 MG tablet Take one three times a day with food for one week 30 tablet 1  . medroxyPROGESTERone (DEPO-PROVERA) 150 MG/ML injection INJECT INTRAMUSCULARLY AS DIRECTED 1 mL 2  . metroNIDAZOLE (FLAGYL) 500 MG tablet Take 1 tablet (500 mg total) by mouth 3 (three) times daily. (Patient not taking: Reported on 09/05/2015) 21 tablet 0  . Multiple  Vitamin (MULTIVITAMIN WITH MINERALS) TABS tablet Take 1 tablet by mouth daily.    . ondansetron (ZOFRAN ODT) 4 MG disintegrating tablet 4mg  ODT q4 hours prn nausea/vomit (Patient not taking: Reported on 09/05/2015) 4 tablet 0  . pantoprazole (PROTONIX) 40 MG tablet Take 1 tablet (40 mg total) by mouth 2 (two) times daily. 60 tablet 0  . potassium chloride SA (K-DUR,KLOR-CON) 20 MEQ tablet Take 1 tablet (20 mEq total) by mouth daily. 3 tablet 0  . sucralfate (CARAFATE) 1 GM/10ML suspension Take 10 mLs (1 g total) by mouth 4 (four) times daily -  with meals and at bedtime. 420 mL 0   No current facility-administered medications on file prior to visit.    Review of Systems:  As per HPI- otherwise negative.   Physical Examination: Filed Vitals:   10/03/15 1724  BP: 168/102  Pulse: 78  Temp: 99.2 F (37.3 C)   Filed Vitals:   10/03/15 1724  Height: 5' 2.25" (1.581 m)  Weight: 161 lb (73.029 kg)   Body mass index is 29.22 kg/(m^2). Ideal Body Weight: Weight in (lb) to have BMI = 25: 137.5  GEN: WDWN, NAD, Non-toxic, A & O x 3, wearing a protective brace on her nose HEENT: Atraumatic, Normocephalic. Neck supple. No masses, No LAD.  Bilateral TM wnl, oropharynx normal.  PEERL,EOMI.   Ears and Nose: No external deformity. CV: RRR, No M/G/R. No JVD. No thrill. No extra heart sounds. PULM: CTA B, no wheezes, crackles, rhonchi. No retractions. No resp. distress. No accessory muscle use. ABD: S, NT, ND. No rebound. No HSM.  Belly is benign She has reproducible tenderness over the left anterior/lateral/posteroir ribs which she states is the same area where she had shingles a few years ago.  No skin lesions EXTR: No c/c/e NEURO Normal gait.  PSYCH: Normally interactive. Conversant. Not depressed or anxious appearing.  Calm demeanor.   [facility] reading (PRIMARY) by  Dr. Lorelei Pont. CXR: negative  CHEST - 2 VIEW  COMPARISON: 07/17/2015  FINDINGS: The heart size and mediastinal contours are  within normal limits. Both lungs are clear. The visualized skeletal structures are unremarkable.  IMPRESSION: No active disease.  EKG: NSR- no ST elevation or depression   Assessment and Plan: Left sided chest pain - Plan: DG Chest 2 View, EKG 12-Lead, valACYclovir (VALTREX) 1000 MG tablet  SOB (shortness of breath) - Plan: DG Chest 2 View  Essential hypertension   Here today seeking evaluation as she felt SOB and also noted CP.  She does feel that her SOB is likely due to the fact that she cannot use her nose to breathe at all right now.  Her Cxr and VS are reassuring.  She feels comfortable with close follow-up Her CP is reproducible  and non- cardiac.  She is worried that she may be getting shingles again- there should be no harm in a course of valtrex so rx this for her today  BP is elevated today- is has normally been under good control.  This may be due to stress and recent surgery.  Asked her to keep an eye on her Bp and let me know if continuing to run high   Signed Lamar Blinks, MD

## 2015-10-09 ENCOUNTER — Emergency Department (HOSPITAL_COMMUNITY)
Admission: EM | Admit: 2015-10-09 | Discharge: 2015-10-09 | Disposition: A | Discharge status: Home / Self Care | Payer: BC Managed Care – PPO | Source: Home / Self Care | Attending: Emergency Medicine | Admitting: Emergency Medicine

## 2015-10-09 ENCOUNTER — Encounter (HOSPITAL_COMMUNITY): Payer: Self-pay | Admitting: Emergency Medicine

## 2015-10-09 ENCOUNTER — Emergency Department (HOSPITAL_COMMUNITY): Payer: BC Managed Care – PPO

## 2015-10-09 ENCOUNTER — Encounter (HOSPITAL_COMMUNITY): Payer: Self-pay

## 2015-10-09 ENCOUNTER — Emergency Department (HOSPITAL_COMMUNITY)
Admission: EM | Admit: 2015-10-09 | Discharge: 2015-10-09 | Disposition: A | Discharge status: Home / Self Care | Payer: BC Managed Care – PPO | Attending: Emergency Medicine | Admitting: Emergency Medicine

## 2015-10-09 DIAGNOSIS — J45901 Unspecified asthma with (acute) exacerbation: Secondary | ICD-10-CM | POA: Insufficient documentation

## 2015-10-09 DIAGNOSIS — I10 Essential (primary) hypertension: Secondary | ICD-10-CM | POA: Insufficient documentation

## 2015-10-09 DIAGNOSIS — K219 Gastro-esophageal reflux disease without esophagitis: Secondary | ICD-10-CM | POA: Diagnosis not present

## 2015-10-09 DIAGNOSIS — Z792 Long term (current) use of antibiotics: Secondary | ICD-10-CM | POA: Diagnosis not present

## 2015-10-09 DIAGNOSIS — Z880 Allergy status to penicillin: Secondary | ICD-10-CM

## 2015-10-09 DIAGNOSIS — E876 Hypokalemia: Secondary | ICD-10-CM | POA: Insufficient documentation

## 2015-10-09 DIAGNOSIS — R079 Chest pain, unspecified: Secondary | ICD-10-CM | POA: Diagnosis present

## 2015-10-09 DIAGNOSIS — R0789 Other chest pain: Secondary | ICD-10-CM

## 2015-10-09 DIAGNOSIS — R000 Tachycardia, unspecified: Secondary | ICD-10-CM | POA: Insufficient documentation

## 2015-10-09 DIAGNOSIS — Z8669 Personal history of other diseases of the nervous system and sense organs: Secondary | ICD-10-CM | POA: Insufficient documentation

## 2015-10-09 DIAGNOSIS — Z79899 Other long term (current) drug therapy: Secondary | ICD-10-CM

## 2015-10-09 DIAGNOSIS — Z793 Long term (current) use of hormonal contraceptives: Secondary | ICD-10-CM | POA: Insufficient documentation

## 2015-10-09 DIAGNOSIS — Z3202 Encounter for pregnancy test, result negative: Secondary | ICD-10-CM | POA: Diagnosis not present

## 2015-10-09 LAB — CBC WITH DIFFERENTIAL/PLATELET
BASOS ABS: 0 10*3/uL (ref 0.0–0.1)
Basophils Relative: 0 %
Eosinophils Absolute: 0.1 10*3/uL (ref 0.0–0.7)
Eosinophils Relative: 2 %
HCT: 41 % (ref 36.0–46.0)
HEMOGLOBIN: 13.9 g/dL (ref 12.0–15.0)
LYMPHS ABS: 1.1 10*3/uL (ref 0.7–4.0)
LYMPHS PCT: 18 %
MCH: 30.2 pg (ref 26.0–34.0)
MCHC: 33.9 g/dL (ref 30.0–36.0)
MCV: 89.1 fL (ref 78.0–100.0)
Monocytes Absolute: 0.6 10*3/uL (ref 0.1–1.0)
Monocytes Relative: 10 %
NEUTROS PCT: 70 %
Neutro Abs: 4.2 10*3/uL (ref 1.7–7.7)
PLATELETS: 313 10*3/uL (ref 150–400)
RBC: 4.6 MIL/uL (ref 3.87–5.11)
RDW: 12.7 % (ref 11.5–15.5)
WBC: 6 10*3/uL (ref 4.0–10.5)

## 2015-10-09 LAB — I-STAT TROPONIN, ED
TROPONIN I, POC: 0 ng/mL (ref 0.00–0.08)
TROPONIN I, POC: 0 ng/mL (ref 0.00–0.08)

## 2015-10-09 LAB — I-STAT CHEM 8, ED
BUN: 8 mg/dL (ref 6–20)
CALCIUM ION: 1.22 mmol/L (ref 1.12–1.23)
CHLORIDE: 104 mmol/L (ref 101–111)
Creatinine, Ser: 0.7 mg/dL (ref 0.44–1.00)
Glucose, Bld: 96 mg/dL (ref 65–99)
HCT: 44 % (ref 36.0–46.0)
Hemoglobin: 15 g/dL (ref 12.0–15.0)
POTASSIUM: 3.2 mmol/L — AB (ref 3.5–5.1)
SODIUM: 142 mmol/L (ref 135–145)
TCO2: 24 mmol/L (ref 0–100)

## 2015-10-09 LAB — I-STAT BETA HCG BLOOD, ED (MC, WL, AP ONLY): I-stat hCG, quantitative: <5 m[IU]/mL (ref <5)

## 2015-10-09 MED ORDER — SODIUM CHLORIDE 0.9 % IV BOLUS (SEPSIS)
500.0000 mL | Freq: Once | INTRAVENOUS | Status: AC
  Administered 2015-10-09: 500 mL via INTRAVENOUS

## 2015-10-09 MED ORDER — PANTOPRAZOLE SODIUM 40 MG PO TBEC: 40.0000 mg | DELAYED_RELEASE_TABLET | Freq: Two times a day (BID) | ORAL | Status: DC

## 2015-10-09 MED ORDER — PANTOPRAZOLE SODIUM 40 MG IV SOLR
40.0000 mg | Freq: Once | INTRAVENOUS | Status: AC
  Administered 2015-10-09: 40 mg via INTRAVENOUS
  Filled 2015-10-09: qty 40

## 2015-10-09 MED ORDER — POTASSIUM CHLORIDE CRYS ER 20 MEQ PO TBCR
40.0000 meq | EXTENDED_RELEASE_TABLET | Freq: Once | ORAL | Status: AC
  Administered 2015-10-09: 40 meq via ORAL
  Filled 2015-10-09: qty 2

## 2015-10-09 MED ORDER — SUCRALFATE 1 GM/10ML PO SUSP: 1.0000 g | Freq: Three times a day (TID) | ORAL | Status: DC

## 2015-10-09 NOTE — ED Notes (Signed)
Patient transported to X-ray. Will draw labs upon return

## 2015-10-09 NOTE — ED Provider Notes (Signed)
CSN: [id number]     Arrival date & time 10/09/15  1523 History  By signing my name below, I, Evelene Croon, attest that this documentation has been prepared under the direction and in the presence of non-physician practitioner, Harlene Ramus, PA-C. Electronically Signed: Evelene Croon, Scribe. 10/09/2015. 9:07 PM.    Chief Complaint  Patient presents with  . Gastroesophageal Reflux    The history is provided by the patient. No language interpreter was used.   HPI Comments:  Megan Lang is a 44 y.o. female with a history of GERD, who presents to the Emergency Department complaining of intermittent burning central CP intermittent for a few weeks. She was seen in the ED this AM for the same and discharged with protonix. She reports taking it before eating this afternoon; denies spicy foods and notes she had [place] with water. However pt notes she has had an increased in fatty, spicy foods and caffeinated drinks for the past few weeks. She notes her pain initially did not improve after taking the pill but notes once she arrived to the ED her pain has improved.  She notes her pain is exacerbated when laying supine or coughing. She reports mild SOB secondary to increased pain.  Pt denies fever and sore throat, abdominal pain, wheezing, N/V.  She is currently on antibiotics secondary to sinus surgery on 10/02/15.   Past Medical History  Diagnosis Date  . Normal spontaneous vaginal delivery     3  . Hypertension   . Asthma   . Allergy   . Cardiomegaly   . Carpal tunnel syndrome    Past Surgical History  Procedure Laterality Date  . Carpal tunnel release  2009  . Carpal tunnel release Left    Family History  Problem Relation Age of Onset  . Hypertension Mother   . Cancer Father     COLON  . Breast cancer Maternal Grandmother   . Hypertension Sister    Social History  Substance Use Topics  . Smoking status: Never Smoker   . Smokeless tobacco: Never Used  . Alcohol Use: 0.0  oz/week    0 Standard drinks or equivalent per week     Comment: OCC- WINE   OB History    Gravida Para Term Preterm AB TAB SAB Ectopic Multiple Living   3 3 3       3      Review of Systems  Constitutional: Negative for fever and chills.  HENT: Negative for sore throat.   Respiratory: Positive for cough and shortness of breath.   Cardiovascular: Positive for chest pain.  All other systems reviewed and are negative.    Allergies  Minocin and Penicillins  Home Medications   Prior to Admission medications   Medication Sig Start Date End Date Taking? Authorizing Provider  bisoprolol-hydrochlorothiazide (ZIAC) 2.5-6.25 MG tablet Take 1 tablet by mouth daily.    Historical Provider, MD  cephALEXin (KEFLEX) 500 MG capsule Take 500 mg by mouth 3 (three) times daily. For 7 days 10/03/15-10/09/15    Historical Provider, MD  ibuprofen (ADVIL,MOTRIN) 800 MG tablet Take one three times a day with food for one week Patient taking differently: Take 800 mg by mouth every 8 (eight) hours as needed for headache or moderate pain.  08/26/15   Terrance Mass, MD  medroxyPROGESTERone (DEPO-PROVERA) 150 MG/ML injection INJECT INTRAMUSCULARLY AS DIRECTED 03/05/15   Terrance Mass, MD  Multiple Vitamin (MULTIVITAMIN WITH MINERALS) TABS tablet Take 1 tablet by mouth daily.  Historical Provider, MD  pantoprazole (PROTONIX) 40 MG tablet Take 1 tablet (40 mg total) by mouth 2 (two) times daily. 10/09/15   Hannah Muthersbaugh, PA-C  potassium chloride SA (K-DUR,KLOR-CON) 20 MEQ tablet Take 1 tablet (20 mEq total) by mouth daily. 06/17/15   Domenic Moras, PA-C  sucralfate (CARAFATE) 1 GM/10ML suspension Take 10 mLs (1 g total) by mouth 4 (four) times daily -  with meals and at bedtime. 10/09/15   Hannah Muthersbaugh, PA-C  valACYclovir (VALTREX) 1000 MG tablet Take 1 tablet (1,000 mg total) by mouth 3 (three) times daily. 10/03/15   Gay Filler Copland, MD   BP 150/98 mmHg  Pulse 110  Temp(Src) 98.3 F (36.8  C) (Oral)  Resp 17  SpO2 98% Physical Exam  Constitutional: She is oriented to person, place, and time. She appears well-developed and well-nourished. No distress.  HENT:  Head: Normocephalic and atraumatic.  Mouth/Throat: Oropharynx is clear and moist. No oropharyngeal exudate.  Eyes: Conjunctivae and EOM are normal. Pupils are equal, round, and reactive to light. Right eye exhibits no discharge. Left eye exhibits no discharge. No scleral icterus.  Neck: Normal range of motion. Neck supple.  Cardiovascular: Normal rate, regular rhythm, normal heart sounds and intact distal pulses.   Pulmonary/Chest: Effort normal and breath sounds normal. No respiratory distress. She has no wheezes. She has no rales. She exhibits no tenderness.  Abdominal: Soft. Bowel sounds are normal. She exhibits no distension and no mass. There is no tenderness. There is no rebound and no guarding.  Neurological: She is alert and oriented to person, place, and time.  Skin: Skin is warm and dry.  Psychiatric: She has a normal mood and affect.  Nursing note and vitals reviewed.   ED Course  Procedures   DIAGNOSTIC STUDIES:  Oxygen Saturation is 100% on RA, normal by my interpretation.    COORDINATION OF CARE:  4:27 PM Discussed treatment plan with pt at bedside and pt agreed to plan.  Labs Review Labs Reviewed  Randolm Idol, ED    Imaging Review Dg Chest 2 View  10/09/2015  CLINICAL DATA:  Mid to left-sided chest pain. Burning sensation worsening over the last several days. EXAM: CHEST  2 VIEW COMPARISON:  10/03/2015 FINDINGS: The cardiomediastinal silhouette is within normal limits. The lungs are well inflated and clear. There is no evidence of pleural effusion or pneumothorax. Minimal thoracic dextroscoliosis is unchanged. IMPRESSION: No active cardiopulmonary disease. Electronically Signed   By: Logan Bores M.D.   On: 10/09/2015 07:13   I have personally reviewed and evaluated these images as part  of my medical decision-making.    MDM   Final diagnoses:  Gastroesophageal reflux disease, esophagitis presence not specified    Patient presents with burning sensation to epigastric/lower midsternal chest wall. She was seen in the ED earlier this morning, EKG unremarkable, negative troponin. She was given Pepcid in the ED with improvement of symptoms and discharged home with pantoprazole. Patient also had a recent negative d-dimer on 10/04/15, no symptoms of DVT today. Pt was dx with mild cardiomegaly in 2013, she states she followed up with cardiovascular regarding cardiomegaly but notes she never had an echo done. Pt denies any other cardiac hx. VSS. Exam unremarkable. EKG unchanged from prior. Troponin negative. I have a low suspicion for ACS, PE, dissection, or other acute cardiac event at this time. I suspect patient's symptoms are likely due to to GERD. Discussed medication use, diet and symptomatic treatment. Advised patient to follow up with  her PCP.  Evaluation does not show pathology requring ongoing emergent intervention or admission. Pt is hemodynamically stable and mentating appropriately. Discussed findings/results and plan with patient/guardian, who agrees with plan. All questions answered. Return precautions discussed and outpatient follow up given.     I personally performed the services described in this documentation, which was scribed in my presence. The recorded information has been reviewed and is accurate.    Chesley Noon [place], [place] 10/09/15 2112  Milton Ferguson, MD 10/09/15 2256

## 2015-10-09 NOTE — ED Notes (Addendum)
Notified EDP,Palumbo,MD., pt. i-stat Chem 8 results potassium 3.2 and RN,Jakeema made aware.

## 2015-10-09 NOTE — ED Provider Notes (Signed)
CSN: [id number]     Arrival date & time 10/09/15  0531 History   First MD Initiated Contact with Patient 10/09/15 [phone]     Chief Complaint  Patient presents with  . Chest Pain     (Consider location/radiation/quality/duration/timing/severity/associated sxs/prior Treatment) The history is provided by the patient and medical records. No language interpreter was used.     Megan Lang is a 44 y.o. female with a hx of hypertension, GERD, asthma, shingles presents to the emergency department with 6 day history of left sided chest pain. The pain is described as burning and worse when she takes a deep breath or twists her torso. She reports the current pain under her left breast is not the same as the shingles pain she has had in the past.  She reports SOB to triage RN, but when asked to explain describes the burning sensation in her chest.  She reports continued nasal congestion since her sinus surgery on 10/02/15.  Pt is taking clindamycin TID for this and reports she has been compliant.  Pt reports she believes she was intubated under general anesthesia for the procedure.  No DOE, nausea, vomiting. She reports a subjective fever 3 days ago which resolved and did not return.  She denies lifting, pulling or new activities.  Pt reports she stopped taking her PPI approx 1 month ago, but does not have a reason for this.  Patient reports an increase in fatty, spicy and sugary foods with minimal water intake for the last several weeks.  Past Medical History  Diagnosis Date  . Normal spontaneous vaginal delivery     3  . Hypertension   . Asthma   . Allergy   . Cardiomegaly   . Carpal tunnel syndrome    Past Surgical History  Procedure Laterality Date  . Carpal tunnel release  2009  . Carpal tunnel release Left    Family History  Problem Relation Age of Onset  . Hypertension Mother   . Cancer Father     COLON  . Breast cancer Maternal Grandmother   . Hypertension Sister    Social  History  Substance Use Topics  . Smoking status: Never Smoker   . Smokeless tobacco: Never Used  . Alcohol Use: 0.0 oz/week    0 Standard drinks or equivalent per week     Comment: OCC- WINE   OB History    Gravida Para Term Preterm AB TAB SAB Ectopic Multiple Living   3 3 3       3      Review of Systems  Constitutional: Negative for fever, diaphoresis, appetite change, fatigue and unexpected weight change.  HENT: Negative for mouth sores.   Eyes: Negative for visual disturbance.  Respiratory: Positive for shortness of breath (burning sensation). Negative for cough, chest tightness and wheezing.   Cardiovascular: Positive for chest pain.  Gastrointestinal: Negative for nausea, vomiting, abdominal pain, diarrhea and constipation.  Endocrine: Negative for polydipsia, polyphagia and polyuria.  Genitourinary: Negative for dysuria, urgency, frequency and hematuria.  Musculoskeletal: Negative for back pain and neck stiffness.  Skin: Negative for rash.  Allergic/Immunologic: Negative for immunocompromised state.  Neurological: Negative for syncope, light-headedness and headaches.  Hematological: Does not bruise/bleed easily.  Psychiatric/Behavioral: Negative for sleep disturbance. The patient is not nervous/anxious.       Allergies  Minocin and Penicillins  Home Medications   Prior to Admission medications   Medication Sig Start Date End Date Taking? Authorizing Provider  bisoprolol-hydrochlorothiazide (ZIAC) 2.5-6.25 MG  tablet Take 1 tablet by mouth daily.   Yes Historical Provider, MD  cephALEXin (KEFLEX) 500 MG capsule Take 500 mg by mouth 3 (three) times daily. For 7 days 10/03/15-10/09/15   Yes Historical Provider, MD  ibuprofen (ADVIL,MOTRIN) 800 MG tablet Take one three times a day with food for one week Patient taking differently: Take 800 mg by mouth every 8 (eight) hours as needed for headache or moderate pain.  08/26/15  Yes Terrance Mass, MD  medroxyPROGESTERone  (DEPO-PROVERA) 150 MG/ML injection INJECT INTRAMUSCULARLY AS DIRECTED 03/05/15  Yes Terrance Mass, MD  Multiple Vitamin (MULTIVITAMIN WITH MINERALS) TABS tablet Take 1 tablet by mouth daily.   Yes Historical Provider, MD  potassium chloride SA (K-DUR,KLOR-CON) 20 MEQ tablet Take 1 tablet (20 mEq total) by mouth daily. 06/17/15  Yes Domenic Moras, PA-C  valACYclovir (VALTREX) 1000 MG tablet Take 1 tablet (1,000 mg total) by mouth 3 (three) times daily. 10/03/15  Yes Gay Filler Copland, MD  pantoprazole (PROTONIX) 40 MG tablet Take 1 tablet (40 mg total) by mouth 2 (two) times daily. 10/09/15   Illa Enlow, PA-C  sucralfate (CARAFATE) 1 GM/10ML suspension Take 10 mLs (1 g total) by mouth 4 (four) times daily -  with meals and at bedtime. 10/09/15   Adela Esteban, PA-C   BP 146/104 mmHg  Pulse 104  Temp(Src) 98 F (36.7 C) (Oral)  Resp 24  SpO2 100% Physical Exam  Constitutional: She appears well-developed and well-nourished. No distress.  Awake, alert, nontoxic appearance  HENT:  Head: Normocephalic and atraumatic.  Mouth/Throat: Oropharynx is clear and moist. No oropharyngeal exudate.  Eyes: Conjunctivae are normal. No scleral icterus.  Neck: Normal range of motion. Neck supple.  Cardiovascular: Normal rate, regular rhythm, normal heart sounds and intact distal pulses.   No murmur heard. Pulmonary/Chest: Effort normal and breath sounds normal. No accessory muscle usage. No tachypnea. No respiratory distress. She has no decreased breath sounds. She has no wheezes. She has no rhonchi. She has no rales. She exhibits tenderness.  Equal chest expansion Clear and equal breath sounds TTP along the bilateral sternal borders  Abdominal: Soft. Bowel sounds are normal. She exhibits no mass. There is no tenderness. There is no rebound and no guarding.  Musculoskeletal: Normal range of motion. She exhibits no edema.  No swelling of either leg No calf tenderness No palpable cord   Neurological: She is alert.  Speech is clear and goal oriented Moves extremities without ataxia  Skin: Skin is warm and dry. She is not diaphoretic.  No rash to the back, flank, breast or chest wall  Psychiatric: She has a normal mood and affect.  Nursing note and vitals reviewed.   ED Course  Procedures (including critical care time) Labs Review Labs Reviewed  I-STAT CHEM 8, ED - Abnormal; Notable for the following:    Potassium 3.2 (*)    All other components within normal limits  CBC WITH DIFFERENTIAL/PLATELET  I-STAT TROPOININ, ED  I-STAT BETA HCG BLOOD, ED ([facility], [facility], [facility] ONLY)    Imaging Review Dg Chest 2 View  10/09/2015  CLINICAL DATA:  Mid to left-sided chest pain. Burning sensation worsening over the last several days. EXAM: CHEST  2 VIEW COMPARISON:  10/03/2015 FINDINGS: The cardiomediastinal silhouette is within normal limits. The lungs are well inflated and clear. There is no evidence of pleural effusion or pneumothorax. Minimal thoracic dextroscoliosis is unchanged. IMPRESSION: No active cardiopulmonary disease. Electronically Signed   By: Seymour Bars.D.  On: 10/09/2015 07:13   I have personally reviewed and evaluated these images and lab results as part of my medical decision-making.   EKG Interpretation None      MDM   Final diagnoses:  Gastroesophageal reflux disease, esophagitis presence not specified  Burning in the chest    Record review shows the patient was seen on [facility] with the same symptoms. That time her chest x-ray was negative. There was no rash over concern for possible shingles was discussed and patient was discharged home with Valtrex.  Patient was then evaluated on 10/04/2015 at [facility] in [place].  At that time her troponin and delta troponin were negative, chest x-ray was negative, EKG was unremarkable and d-dimer was negative.  Today patient continues to complain of the same symptoms.  At triage she had mild tachycardia to 106 however on my physical exam she was no longer tachycardic. She has no accessory muscle usage, no distress, no hypoxia. She is afebrile and without hypotension.  She has no history of cardiac disease. On exam she is tender along the bilateral sternal borders.  Negative troponin, no leukocytosis. Mild hypokalemia repleted here in the department.  EKG nonischemic. Patient is well-appearing. She reports resolution of the burning feeling after administration of Pepcid.  Pain is reproducible and does not appear cardiac in nature. I doubt pulmonary embolism at this time.  No signs and symptoms of DVT.  The patient was discussed with Dr. Randal Buba who agrees with the treatment plan including no repeat of d-dimer as it was negative on 10/04/15 and pt has no ssx of DVT.   Abigail Butts, PA-C 10/09/15 0900  April Palumbo, MD 10/15/15 2318

## 2015-10-09 NOTE — ED Notes (Addendum)
Pt states that she was seen here this am for the same symptoms.  Pt was diagnosed with acid reflux and took the medications when she got home today and pain has decreased some. Pt states that she hasnt taken her HTN meds today.

## 2015-10-09 NOTE — ED Notes (Signed)
Pt states the pain is a burning sensation under her left breast and side, no sign of a rash at this time.

## 2015-10-09 NOTE — ED Notes (Signed)
Pt had sinus surgery one week ago here, was seen in [place] on Saturday and was dx with shingle outbreak but doesn't have a rash, today she continues to complain of shortness of breath and chest pain

## 2015-10-09 NOTE — Discharge Instructions (Signed)
Continue taking your medications as prescribed. I recommend following the diet we discussed in the emergency department and listed in your discharge paperwork. Follow-up with your primary care provider in 4-5 days. Return to emergency department if symptoms worsen or new onset of fever, headache, visual changes, difficulty breathing, wheezing, chest pain, shortness of breath, abdominal pain, vomiting, numbness, tingling, weakness, syncope.

## 2015-10-09 NOTE — Discharge Instructions (Signed)
1. Medications: Carafate, protonix, usual home medications 2. Treatment: rest, drink plenty of fluids,  3. Follow Up: Please followup with your primary doctor in 3-5 days for discussion of your diagnoses and further evaluation after today's visit; if you do not have a primary care doctor use the resource guide provided to find one; Please return to the ER for worsening symptoms, SOB or other concerns

## 2015-10-10 ENCOUNTER — Emergency Department (HOSPITAL_COMMUNITY): Payer: BC Managed Care – PPO

## 2015-10-10 ENCOUNTER — Encounter (HOSPITAL_COMMUNITY): Payer: Self-pay | Admitting: Emergency Medicine

## 2015-10-10 ENCOUNTER — Emergency Department (HOSPITAL_COMMUNITY)
Admission: EM | Admit: 2015-10-10 | Discharge: 2015-10-10 | Disposition: A | Discharge status: Home / Self Care | Payer: BC Managed Care – PPO | Source: Home / Self Care | Attending: Emergency Medicine | Admitting: Emergency Medicine

## 2015-10-10 ENCOUNTER — Emergency Department (HOSPITAL_COMMUNITY)
Admission: EM | Admit: 2015-10-10 | Discharge: 2015-10-10 | Disposition: A | Discharge status: Home / Self Care | Payer: BC Managed Care – PPO | Attending: Emergency Medicine | Admitting: Emergency Medicine

## 2015-10-10 DIAGNOSIS — Z8639 Personal history of other endocrine, nutritional and metabolic disease: Secondary | ICD-10-CM | POA: Insufficient documentation

## 2015-10-10 DIAGNOSIS — Z79818 Long term (current) use of other agents affecting estrogen receptors and estrogen levels: Secondary | ICD-10-CM | POA: Diagnosis not present

## 2015-10-10 DIAGNOSIS — I10 Essential (primary) hypertension: Secondary | ICD-10-CM

## 2015-10-10 DIAGNOSIS — Z79899 Other long term (current) drug therapy: Secondary | ICD-10-CM | POA: Insufficient documentation

## 2015-10-10 DIAGNOSIS — Z8619 Personal history of other infectious and parasitic diseases: Secondary | ICD-10-CM | POA: Insufficient documentation

## 2015-10-10 DIAGNOSIS — Z880 Allergy status to penicillin: Secondary | ICD-10-CM | POA: Insufficient documentation

## 2015-10-10 DIAGNOSIS — R000 Tachycardia, unspecified: Secondary | ICD-10-CM | POA: Diagnosis not present

## 2015-10-10 DIAGNOSIS — J069 Acute upper respiratory infection, unspecified: Secondary | ICD-10-CM | POA: Insufficient documentation

## 2015-10-10 DIAGNOSIS — R0602 Shortness of breath: Secondary | ICD-10-CM

## 2015-10-10 DIAGNOSIS — J45909 Unspecified asthma, uncomplicated: Secondary | ICD-10-CM

## 2015-10-10 DIAGNOSIS — R079 Chest pain, unspecified: Secondary | ICD-10-CM | POA: Diagnosis present

## 2015-10-10 DIAGNOSIS — Z8669 Personal history of other diseases of the nervous system and sense organs: Secondary | ICD-10-CM

## 2015-10-10 DIAGNOSIS — B9789 Other viral agents as the cause of diseases classified elsewhere: Principal | ICD-10-CM

## 2015-10-10 DIAGNOSIS — J45901 Unspecified asthma with (acute) exacerbation: Secondary | ICD-10-CM | POA: Insufficient documentation

## 2015-10-10 LAB — BASIC METABOLIC PANEL
Anion gap: 9 (ref 5–15)
BUN: 11 mg/dL (ref 6–20)
CALCIUM: 9.5 mg/dL (ref 8.9–10.3)
CHLORIDE: 107 mmol/L (ref 101–111)
CO2: 23 mmol/L (ref 22–32)
CREATININE: 0.77 mg/dL (ref 0.44–1.00)
GFR calc non Af Amer: >60 mL/min (ref >60)
Glucose, Bld: 143 mg/dL — ABNORMAL HIGH (ref 65–99)
Potassium: 3.4 mmol/L — ABNORMAL LOW (ref 3.5–5.1)
SODIUM: 139 mmol/L (ref 135–145)

## 2015-10-10 LAB — CBC
HCT: 37.9 % (ref 36.0–46.0)
HEMOGLOBIN: 12.8 g/dL (ref 12.0–15.0)
MCH: 30.1 pg (ref 26.0–34.0)
MCHC: 33.8 g/dL (ref 30.0–36.0)
MCV: 89.2 fL (ref 78.0–100.0)
Platelets: 300 10*3/uL (ref 150–400)
RBC: 4.25 MIL/uL (ref 3.87–5.11)
RDW: 12.8 % (ref 11.5–15.5)
WBC: 6.2 10*3/uL (ref 4.0–10.5)

## 2015-10-10 LAB — HEPATIC FUNCTION PANEL
ALT: 19 U/L (ref 14–54)
AST: 18 U/L (ref 15–41)
Albumin: 4 g/dL (ref 3.5–5.0)
Alkaline Phosphatase: 64 U/L (ref 38–126)
BILIRUBIN DIRECT: 0.2 mg/dL (ref 0.1–0.5)
BILIRUBIN INDIRECT: 0.7 mg/dL (ref 0.3–0.9)
BILIRUBIN TOTAL: 0.9 mg/dL (ref 0.3–1.2)
Total Protein: 7.4 g/dL (ref 6.5–8.1)

## 2015-10-10 LAB — BRAIN NATRIURETIC PEPTIDE: B Natriuretic Peptide: 10.3 pg/mL (ref 0.0–100.0)

## 2015-10-10 LAB — LIPASE, BLOOD: Lipase: 28 U/L (ref 11–51)

## 2015-10-10 LAB — I-STAT TROPONIN, ED: TROPONIN I, POC: 0 ng/mL (ref 0.00–0.08)

## 2015-10-10 MED ORDER — IPRATROPIUM-ALBUTEROL 0.5-2.5 (3) MG/3ML IN SOLN
3.0000 mL | Freq: Once | RESPIRATORY_TRACT | Status: AC
  Administered 2015-10-10: 3 mL via RESPIRATORY_TRACT
  Filled 2015-10-10: qty 3

## 2015-10-10 MED ORDER — ACETAMINOPHEN-CODEINE 120-12 MG/5ML PO SOLN
10.0000 mL | Freq: Once | ORAL | Status: AC
  Administered 2015-10-10: 10 mL via ORAL
  Filled 2015-10-10: qty 10

## 2015-10-10 MED ORDER — IOHEXOL 350 MG/ML SOLN
100.0000 mL | Freq: Once | INTRAVENOUS | Status: AC | PRN
  Administered 2015-10-10: 100 mL via INTRAVENOUS

## 2015-10-10 MED ORDER — GI COCKTAIL ~~LOC~~
30.0000 mL | Freq: Once | ORAL | Status: AC
  Administered 2015-10-10: 30 mL via ORAL
  Filled 2015-10-10: qty 30

## 2015-10-10 MED ORDER — OXYCODONE-ACETAMINOPHEN 5-325 MG PO TABS
1.0000 | ORAL_TABLET | Freq: Once | ORAL | Status: AC
  Administered 2015-10-10: 1 via ORAL
  Filled 2015-10-10: qty 1

## 2015-10-10 MED ORDER — ACETAMINOPHEN-CODEINE 120-12 MG/5ML PO SOLN: 10.0000 mL | ORAL | Status: DC | PRN

## 2015-10-10 MED ORDER — GUAIFENESIN ER 1200 MG PO TB12: 1.0000 | ORAL_TABLET | Freq: Two times a day (BID) | ORAL | Status: DC

## 2015-10-10 NOTE — ED Notes (Signed)
Pt comes to Ed with c/o of chest pain and cough. Pt was just here yesterday, see other notes. Provider in room.

## 2015-10-10 NOTE — ED Notes (Signed)
Patient states IV is hurting. Saline locked, clean, dry, intact. No redness or swelling. Patient advised to leave IV in until discharge.

## 2015-10-10 NOTE — ED Provider Notes (Signed)
CSN: [id number]     Arrival date & time 10/10/15  0746 History   First MD Initiated Contact with Patient 10/10/15 [phone]     Chief Complaint  Patient presents with  . Chest Pain  . Cough     (Consider location/radiation/quality/duration/timing/severity/associated sxs/prior Treatment) The history is provided by the patient and medical records. No language interpreter was used.     Megan Lang is a 44 y.o. female  with a hx of hypertension, GERD, asthma, shingles presents to the emergency department with 7 day history of left sided chest pain. The pain is described as burning and worse when she takes a deep breath or twists her torso. She reports the current pain under her left breast is not the same as the shingles pain she has had in the past. She denies rash.  She is reporting continued shortness of breath with development of productive cough in the last 24 hours.  She reports continued nasal congestion since her sinus surgery on 10/02/15, but denies sinus pain or fevers. Pt is taking clindamycin TID for this and reports she continues to be compliant. Pt reports she believes she was intubated under general anesthesia for the procedure. No DOE, nausea, vomiting. She reports a subjective fever 3 days ago which resolved and did not return. She again denies lifting, pulling or new activities. Pt restarted on her PPI yesterday and reports taking it this morning without relief.  Pt reports she is here today because she cannot sleep due to the burning in her chest.    Record review shows that pt has been evaluated 3 times in the last 24 hours for the same.  Her PPI was restarted and she was treated for a URI.    Past Medical History  Diagnosis Date  . Normal spontaneous vaginal delivery     3  . Hypertension   . Asthma   . Allergy   . Cardiomegaly   . Carpal tunnel syndrome    Past Surgical History  Procedure Laterality Date  . Carpal tunnel release  2009  . Carpal tunnel release  Left    Family History  Problem Relation Age of Onset  . Hypertension Mother   . Cancer Father     COLON  . Breast cancer Maternal Grandmother   . Hypertension Sister    Social History  Substance Use Topics  . Smoking status: Never Smoker   . Smokeless tobacco: Never Used  . Alcohol Use: 0.0 oz/week    0 Standard drinks or equivalent per week     Comment: OCC- WINE   OB History    Gravida Para Term Preterm AB TAB SAB Ectopic Multiple Living   3 3 3       3      Review of Systems  Constitutional: Negative for fever, diaphoresis, appetite change, fatigue and unexpected weight change.  HENT: Negative for mouth sores.   Eyes: Negative for visual disturbance.  Respiratory: Positive for cough. Negative for chest tightness, shortness of breath and wheezing.   Cardiovascular: Positive for chest pain.  Gastrointestinal: Negative for nausea, vomiting, abdominal pain, diarrhea and constipation.  Endocrine: Negative for polydipsia, polyphagia and polyuria.  Genitourinary: Negative for dysuria, urgency, frequency and hematuria.  Musculoskeletal: Negative for back pain and neck stiffness.  Skin: Negative for rash.  Allergic/Immunologic: Negative for immunocompromised state.  Neurological: Negative for syncope, light-headedness and headaches.  Hematological: Does not bruise/bleed easily.  Psychiatric/Behavioral: Negative for sleep disturbance. The patient is not nervous/anxious.  Allergies  Minocin and Penicillins  Home Medications   Prior to Admission medications   Medication Sig Start Date End Date Taking? Authorizing Provider  bisoprolol-hydrochlorothiazide (ZIAC) 2.5-6.25 MG tablet Take 1 tablet by mouth daily.   Yes Historical Provider, MD  cephALEXin (KEFLEX) 500 MG capsule Take 500 mg by mouth 3 (three) times daily. For 7 days 10/03/15-10/09/15   Yes Historical Provider, MD  medroxyPROGESTERone (DEPO-PROVERA) 150 MG/ML injection INJECT INTRAMUSCULARLY AS DIRECTED  03/05/15  Yes Terrance Mass, MD  Multiple Vitamin (MULTIVITAMIN WITH MINERALS) TABS tablet Take 1 tablet by mouth daily.   Yes Historical Provider, MD  pantoprazole (PROTONIX) 40 MG tablet Take 1 tablet (40 mg total) by mouth 2 (two) times daily. 10/09/15  Yes Aunesty Tyson, PA-C  potassium chloride SA (K-DUR,KLOR-CON) 20 MEQ tablet Take 1 tablet (20 mEq total) by mouth daily. 06/17/15  Yes Domenic Moras, PA-C  sucralfate (CARAFATE) 1 GM/10ML suspension Take 10 mLs (1 g total) by mouth 4 (four) times daily -  with meals and at bedtime. 10/09/15  Yes Jabron Weese, PA-C  valACYclovir (VALTREX) 1000 MG tablet Take 1 tablet (1,000 mg total) by mouth 3 (three) times daily. 10/03/15  Yes Gay Filler Copland, MD  acetaminophen-codeine 120-12 MG/5ML solution Take 10 mLs by mouth every 4 (four) hours as needed for moderate pain. Patient not taking: Reported on 10/10/2015 10/10/15   Dalia Heading, PA-C  Guaifenesin 1200 MG TB12 Take 1 tablet (1,200 mg total) by mouth 2 (two) times daily. Patient not taking: Reported on 10/10/2015 10/10/15   Dalia Heading, PA-C  ibuprofen (ADVIL,MOTRIN) 800 MG tablet Take one three times a day with food for one week Patient not taking: Reported on 10/10/2015 08/26/15   Terrance Mass, MD   BP 147/94 mmHg  Pulse 93  Temp(Src) 98.2 F (36.8 C) (Oral)  Resp 18  SpO2 99% Physical Exam  Constitutional: She is oriented to person, place, and time. She appears well-developed and well-nourished. No distress.  Awake, alert, nontoxic appearance  HENT:  Head: Normocephalic and atraumatic.  Right Ear: Tympanic membrane, external ear and ear canal normal.  Left Ear: Tympanic membrane, external ear and ear canal normal.  Nose: Nose normal. No epistaxis. Right sinus exhibits no maxillary sinus tenderness and no frontal sinus tenderness. Left sinus exhibits no maxillary sinus tenderness and no frontal sinus tenderness.  Mouth/Throat: Uvula is midline, oropharynx  is clear and moist and mucous membranes are normal. Mucous membranes are not pale and not cyanotic. No oropharyngeal exudate, posterior oropharyngeal edema, posterior oropharyngeal erythema or tonsillar abscesses.  No swelling of the oropharynx  Eyes: Conjunctivae are normal. Pupils are equal, round, and reactive to light. No scleral icterus.  Neck: Normal range of motion and full passive range of motion without pain. Neck supple.  Patent airway Handling her secretions Normal phonation  Cardiovascular: Regular rhythm, normal heart sounds and intact distal pulses.  Tachycardia present.   Pulses:      Radial pulses are 2+ on the right side, and 2+ on the left side.       Dorsalis pedis pulses are 2+ on the right side, and 2+ on the left side.  Mild tachycardia  Pulmonary/Chest: Effort normal and breath sounds normal. No stridor. No respiratory distress. She has no wheezes.  Equal chest expansion Clear and equal breath sounds  Abdominal: Soft. Bowel sounds are normal. She exhibits no mass. There is no tenderness. There is no rebound and no guarding.  Musculoskeletal: Normal range of motion. She  exhibits no edema.  Lymphadenopathy:    She has no cervical adenopathy.  Neurological: She is alert and oriented to person, place, and time.  Speech is clear and goal oriented Moves extremities without ataxia  Skin: Skin is warm and dry. No rash noted. She is not diaphoretic.  Psychiatric: She has a normal mood and affect.  Nursing note and vitals reviewed.   ED Course  Procedures (including critical care time) Labs Review Labs Reviewed  BASIC METABOLIC PANEL - Abnormal; Notable for the following:    Potassium 3.4 (*)    Glucose, Bld 143 (*)    All other components within normal limits  BRAIN NATRIURETIC PEPTIDE  CBC  LIPASE, BLOOD  HEPATIC FUNCTION PANEL  I-STAT TROPOININ, ED    Imaging Review Dg Chest 2 View  10/09/2015  CLINICAL DATA:  Mid to left-sided chest pain. Burning  sensation worsening over the last several days. EXAM: CHEST  2 VIEW COMPARISON:  10/03/2015 FINDINGS: The cardiomediastinal silhouette is within normal limits. The lungs are well inflated and clear. There is no evidence of pleural effusion or pneumothorax. Minimal thoracic dextroscoliosis is unchanged. IMPRESSION: No active cardiopulmonary disease. Electronically Signed   By: Logan Bores M.D.   On: 10/09/2015 07:13   Ct Angio Chest Pe W/cm &/or Wo Cm  10/10/2015  CLINICAL DATA:  Shortness of breath and tachycardia EXAM: CT ANGIOGRAPHY CHEST WITH CONTRAST TECHNIQUE: Multidetector CT imaging of the chest was performed using the standard protocol during bolus administration of intravenous contrast. Multiplanar CT image reconstructions and MIPs were obtained to evaluate the vascular anatomy. CONTRAST:  135mL OMNIPAQUE IOHEXOL 350 MG/ML SOLN COMPARISON:  Chest radiograph October 09, 2015 FINDINGS: There is no demonstrable pulmonary embolus. There is prominence of the ascending thoracic aorta with a maximum transverse diameter of 4.2 x 4.1 cm. There is no thoracic aortic dissection. The visualized great vessels appear unremarkable. Note that the left and right common carotid arteries arise as a common trunk, an anatomic variant. There is no edema or consolidation. On axial slice 37 series 7, there is a 3 mm nodular opacity in the anterior segment of the left upper lobe. There appears to be a degree of residual thymic tissue in the anterior mediastinum. There are several nearby lymph nodes immediately adjacent to the aortic arch on the left. There are several mildly prominent lymph nodes in each axillary region. The largest individual lymph node is in the right axilla measuring 1.5 x 0.9 cm. Pericardium is not thickened. There are scattered foci of coronary artery calcification. In the visualized upper abdomen, there are several small liver cysts, largest measuring 1.3 x 1.3 cm. Visualized upper abdominal structures  otherwise appear unremarkable. There are no blastic or lytic bone lesions. Review of the MIP images confirms the above findings. IMPRESSION: No demonstrable pulmonary embolus. Prominence of the ascending thoracic aorta with a maximum transverse diameter 4.2 x 4.1 cm. Recommend annual imaging followup by CTA or MRA. This recommendation follows 2010 ACCF/AHA/AATS/ACR/ASA/SCA/SCAI/SIR/STS/SVM Guidelines for the Diagnosis and Management of Patients with Thoracic Aortic Disease. Circulation. 2010; 121: e266-e369 3 mm nodular opacity left upper lobe. Followup of this nodular opacity should be based on Fleischner Society guidelines. If the patient is at high risk for bronchogenic carcinoma, follow-up chest CT at 1year is recommended. If the patient is at low risk, no follow-up is needed. This recommendation follows the consensus statement: Guidelines for Management of Small Pulmonary Nodules Detected on CT Scans: A Statement from the  as published in  Radiology 2005; [id number]. 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 There are several small lymph nodes in the anterior mediastinum near the thymus as well as in each axillary region. Significance of these lymph nodes is uncertain. Scattered foci of coronary artery calcification apparent. Electronically Signed   By: Lowella Grip III M.D.   On: 10/10/2015 09:28   I have personally reviewed and evaluated these images and lab results as part of my medical decision-making.   MDM   Final diagnoses:  SOB (shortness of breath)  Chest pain, unspecified chest pain type   Megan Lang presents with CP, SOB.  She has had multiple evaluations for this with 3 in the last 24 hours.  She is tachycardic to 100 with persistent symptoms.  Will repeat labs and obtain CT chest to assess for PE or dissection.    The patient was discussed with and seen by Dr. Alfonse Spruce who agrees with the treatment plan.   Labs are reassuring. Troponin  is negative. Mild hypokalemia at 3.4. Discussed diet adjustment.  CT angiogram with no evidence of pulmonary embolism. There is prominence of the ascending thoracic aorta with a transverse diameter of 4.2 x 4.1 cm. Discussed with cardiothoracic surgery who agrees that patient can be followed on the outpatient for this as there is no evidence of dissection at this time.  Also of note patient has a 3 mm nodule of the left upper lobe.  In addition she has some scattered lymphadenopathy is mediastinum.  No mediastinal mass.  These abnormalities were discussed with the patient. I do not believe that any of these are causing her shortness of breath.  I have requested that she follow-up both with CT surgery and her primary care for following of these abnormalities. She states understanding and agreement.  Patient is without tachycardia at discharge. She reports her chest pain is better. She is without hypoxia or hypotension. Repeat EKGs and troponins for the last 24 hours have been negative. There is no indication of ACS.  Discussed conservative pain control and close follow-up with primary care.  BP 140/100 mmHg  Pulse 82  Temp(Src) 98.2 F (36.8 C) (Oral)  Resp 20  SpO2 100%  LMP    Abigail Butts, PA-C 10/10/15 1657  Harvel Quale, MD 10/14/15 [phone]

## 2015-10-10 NOTE — Discharge Instructions (Signed)
1. Medications: usual home medications 2. Treatment: rest, drink plenty of fluids,  3. Follow Up: Please followup with your primary doctor in 2 days for discussion of your diagnoses and further evaluation after today's visit; please also follow up with CT surgery as listed on her discharge; Please return to the ER for  worsening symptoms or uncontrollable pain

## 2015-10-10 NOTE — ED Notes (Signed)
Sinus surgery, in past, negative d- dimer, provider aware of medical history.

## 2015-10-10 NOTE — ED Notes (Signed)
Patient transported to CT 

## 2015-10-10 NOTE — Discharge Instructions (Signed)
Continued take the medications prescribed earlier.  Return here as needed.  Increase her fluid intake, rest as much as possible.  Follow-up with your primary care doctor

## 2015-10-10 NOTE — ED Provider Notes (Signed)
CSN: [id number]     Arrival date & time 10/10/15  0001 History   First MD Initiated Contact with Patient 10/10/15 0035     Chief Complaint  Patient presents with  . Cough     (Consider location/radiation/quality/duration/timing/severity/associated sxs/prior Treatment) HPI Patient presents to the emergency department with cough that is productive of yellow sputum with a burning in her chest that is worse with coughing.  The patient states that she was treated for acid reflux, on her previous 2 visits earlier today.  The patient states that the medications prescribed seem to help somewhat with her sensation of burning but did not completely resolve it.  The patient states she is currently on a course of antibiotics following sinus surgery.  Patient did not take any other medications prior to arrival.  The patient denies headache, blurred vision, fever, weakness, dizziness, back pain, neck pain, abdominal pain, nausea, vomiting, diarrhea, rash, near syncope or syncope.  The patient states that she has not had any hemoptysis Past Medical History  Diagnosis Date  . Normal spontaneous vaginal delivery     3  . Hypertension   . Asthma   . Allergy   . Cardiomegaly   . Carpal tunnel syndrome    Past Surgical History  Procedure Laterality Date  . Carpal tunnel release  2009  . Carpal tunnel release Left    Family History  Problem Relation Age of Onset  . Hypertension Mother   . Cancer Father     COLON  . Breast cancer Maternal Grandmother   . Hypertension Sister    Social History  Substance Use Topics  . Smoking status: Never Smoker   . Smokeless tobacco: Never Used  . Alcohol Use: 0.0 oz/week    0 Standard drinks or equivalent per week     Comment: OCC- WINE   OB History    Gravida Para Term Preterm AB TAB SAB Ectopic Multiple Living   3 3 3       3      Review of Systems  All other systems negative except as documented in the HPI. All pertinent positives and negatives as  reviewed in the HPI.  Allergies  Minocin and Penicillins  Home Medications   Prior to Admission medications   Medication Sig Start Date End Date Taking? Authorizing Provider  bisoprolol-hydrochlorothiazide (ZIAC) 2.5-6.25 MG tablet Take 1 tablet by mouth daily.    Historical Provider, MD  cephALEXin (KEFLEX) 500 MG capsule Take 500 mg by mouth 3 (three) times daily. For 7 days 10/03/15-10/09/15    Historical Provider, MD  ibuprofen (ADVIL,MOTRIN) 800 MG tablet Take one three times a day with food for one week Patient taking differently: Take 800 mg by mouth every 8 (eight) hours as needed for headache or moderate pain.  08/26/15   Terrance Mass, MD  medroxyPROGESTERone (DEPO-PROVERA) 150 MG/ML injection INJECT INTRAMUSCULARLY AS DIRECTED 03/05/15   Terrance Mass, MD  Multiple Vitamin (MULTIVITAMIN WITH MINERALS) TABS tablet Take 1 tablet by mouth daily.    Historical Provider, MD  pantoprazole (PROTONIX) 40 MG tablet Take 1 tablet (40 mg total) by mouth 2 (two) times daily. 10/09/15   Hannah Muthersbaugh, PA-C  potassium chloride SA (K-DUR,KLOR-CON) 20 MEQ tablet Take 1 tablet (20 mEq total) by mouth daily. 06/17/15   Domenic Moras, PA-C  sucralfate (CARAFATE) 1 GM/10ML suspension Take 10 mLs (1 g total) by mouth 4 (four) times daily -  with meals and at bedtime. 10/09/15   Jarrett Soho Muthersbaugh,  PA-C  valACYclovir (VALTREX) 1000 MG tablet Take 1 tablet (1,000 mg total) by mouth 3 (three) times daily. 10/03/15   Gay Filler Copland, MD   BP 145/99 mmHg  Pulse 95  Temp(Src) 98.6 F (37 C) (Oral)  Resp 20  SpO2 99% Physical Exam  Constitutional: She is oriented to person, place, and time. She appears well-developed and well-nourished. No distress.  HENT:  Head: Normocephalic and atraumatic.  Mouth/Throat: Oropharynx is clear and moist.  Eyes: Pupils are equal, round, and reactive to light.  Neck: Normal range of motion. Neck supple.  Cardiovascular: Normal rate, regular rhythm and normal  heart sounds.  Exam reveals no gallop and no friction rub.   No murmur heard. Pulmonary/Chest: Effort normal and breath sounds normal. No respiratory distress. She has no wheezes.  Abdominal: Soft. Bowel sounds are normal. She exhibits no distension. There is no tenderness.  Neurological: She is alert and oriented to person, place, and time. She exhibits normal muscle tone. Coordination normal.  Skin: Skin is warm and dry. No rash noted. No erythema.  Psychiatric: She has a normal mood and affect. Her behavior is normal.  Nursing note and vitals reviewed.   ED Course  Procedures (including critical care time) Labs Review Labs Reviewed - No data to display  Imaging Review Dg Chest 2 View  10/09/2015  CLINICAL DATA:  Mid to left-sided chest pain. Burning sensation worsening over the last several days. EXAM: CHEST  2 VIEW COMPARISON:  10/03/2015 FINDINGS: The cardiomediastinal silhouette is within normal limits. The lungs are well inflated and clear. There is no evidence of pleural effusion or pneumothorax. Minimal thoracic dextroscoliosis is unchanged. IMPRESSION: No active cardiopulmonary disease. Electronically Signed   By: Logan Bores M.D.   On: 10/09/2015 07:13   I have personally reviewed and evaluated these images and lab results as part of my medical decision-making.  Patient be treated for an upper respiratory tract infection.  Told to return here as needed.  Patient is advised to increase her fluid intake and rest as much possible.  I reviewed her previous 2 visits the emergency department and I would advise her to continue to take the medications prescribed for GERD as she does have some symptoms that are consistent with this  Dalia Heading, PA-C [id number]  Delora Fuel, MD [id number]

## 2015-10-10 NOTE — ED Notes (Signed)
Pt states she is here because she has a productive cough with yellow sputum  Pt states she has a burning in her chest but was seen twice earlier today for same and was treated for reflux

## 2015-10-13 ENCOUNTER — Ambulatory Visit (INDEPENDENT_AMBULATORY_CARE_PROVIDER_SITE_OTHER): Payer: BC Managed Care – PPO | Admitting: Family Medicine

## 2015-10-13 VITALS — BP 130/86 | HR 90 | Temp 98.6°F | Resp 16 | Ht 62.25 in | Wt 156.0 lb

## 2015-10-13 DIAGNOSIS — I7789 Other specified disorders of arteries and arterioles: Secondary | ICD-10-CM | POA: Diagnosis not present

## 2015-10-13 DIAGNOSIS — R109 Unspecified abdominal pain: Secondary | ICD-10-CM | POA: Diagnosis not present

## 2015-10-13 MED ORDER — LIDOCAINE 5 % EX PTCH: MEDICATED_PATCH | CUTANEOUS | Status: DC

## 2015-10-13 MED ORDER — GABAPENTIN 300 MG PO CAPS: 300.0000 mg | ORAL_CAPSULE | Freq: Three times a day (TID) | ORAL | Status: DC

## 2015-10-13 NOTE — Patient Instructions (Signed)
It does not appear that your pain is due to anything dangerous. Your idea about shingles pain may be correct.  We are going to try treating this with neurontin (a medication for nerve pain- start with 1 on the first day, twice a day on the second day and then three times a day on the third day).  You can also use the lidocaine patches on your skin .   Please let me know how you are doing over the next few days- you may send me a mychart message!   I am going to refer you to vascular surgery to talk about the finding in your aorta and help [place] decide how much follow-up imaging is needed.

## 2015-10-13 NOTE — Progress Notes (Signed)
[facility] [address] [place] [address] [phone]  Date:  10/13/2015   Name:  Megan Lang   DOB:  [birth year]   MRN:  [id number]  PCP:  Lamar Blinks, MD    Chief Complaint: Cough; Chest Pain; and Hot Flashes   History of Present Illness:  Megan Lang is a 44 y.o. very pleasant female patient who presents with the following:  Seen here on 12/16 with CP and SOB- this was felt to be benign in nature.   She went to [facility] in [place] on 12/17 and had a negative D dimer,negative  troponin.  She then went to the [facility] ER twice on 12/22 with left sided CP which was felt to be benign.  She returned to the ER again twice on 12/23 with complaint of a coughing up a yellow sputum.    She continues to notice a "pain and burning in my chest under my breast" (left side), "and it wakes me up and I feel hot and I see redness when I wake up, if I see light it's like a red color."  She thinks it could be a recurrence of shingles. I gave hear course of valtrex on 12/16 and then she went to another UC 2 days ago and was started on another course of valtrex.    She admits that she is getting upset and "depressed' worrying that this pain will never go away  She is on depo shot  She underwent a CT angio at the ER on 12/23- no PE seen but a couple of other unrelated findings as below   IMPRESSION: No demonstrable pulmonary embolus.  Prominence of the ascending thoracic aorta with a maximum transverse diameter 4.2 x 4.1 cm. Recommend annual imaging followup by CTA or MRA. This recommendation follows 2010 ACCF/AHA/AATS/ACR/ASA/SCA/SCAI/SIR/STS/SVM Guidelines for the Diagnosis and Management of Patients with Thoracic Aortic Disease. Circulation. 2010; 121: e266-e369  3 mm nodular opacity left upper lobe. Followup of this nodular opacity should be based on Fleischner Society guidelines. If the patient is at high risk for bronchogenic carcinoma,  follow-up chest CT at 1year is recommended. If the patient is at low risk, no follow-up is needed. This recommendation follows the consensus statement: Guidelines for Management of Small Pulmonary Nodules Detected on CT Scans: A Statement from the [employer] as published in Radiology 2005; 237:395-400.  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 There are several small lymph nodes in the anterior mediastinum near the thymus as well as in each axillary region. Significance of these lymph nodes is uncertain.  Scattered foci of coronary artery calcification apparent.  She has never been a smoker, no exposure to asbestos, etc.  She is a low risk pt so does not need follow-up CT   Patient Active Problem List   Diagnosis Date Noted  . Costochondritis 08/26/2015  . Obesity 07/01/2015  . Upper airway cough syndrome 06/30/2015  . History of shingles 01/26/2015  . Family history of colon cancer 10/27/2011  . Essential hypertension 10/27/2011    Past Medical History  Diagnosis Date  . Normal spontaneous vaginal delivery     3  . Hypertension   . Asthma   . Allergy   . Cardiomegaly   . Carpal tunnel syndrome     Past Surgical History  Procedure Laterality Date  . Carpal tunnel release  2009  . Carpal tunnel release Left     Social History  Substance Use Topics  .  Smoking status: Never Smoker   . Smokeless tobacco: Never Used  . Alcohol Use: 0.0 oz/week    0 Standard drinks or equivalent per week     Comment: OCC- WINE    Family History  Problem Relation Age of Onset  . Hypertension Mother   . Cancer Father     COLON  . Breast cancer Maternal Grandmother   . Hypertension Sister     Allergies  Allergen Reactions  . Minocin [Minocycline Hcl] Other (See Comments)    Stomach Pains  . Penicillins Rash    Has patient had a PCN reaction causing immediate rash, facial/tongue/throat swelling, SOB or lightheadedness with hypotension: Yesyes Has  patient had a PCN reaction causing severe rash involving mucus membranes or skin necrosis: Yesno Has patient had a PCN reaction that required hospitalization Yesno Has patient had a PCN reaction occurring within the last 10 years: Jolyne Loa If all of the above answers are "NO", then may proceed with Cephalosporin    Medication list has been reviewed and updated.  Current Outpatient Prescriptions on File Prior to Visit  Medication Sig Dispense Refill  . acetaminophen-codeine 120-12 MG/5ML solution Take 10 mLs by mouth every 4 (four) hours as needed for moderate pain. 120 mL 0  . bisoprolol-hydrochlorothiazide (ZIAC) 2.5-6.25 MG tablet Take 1 tablet by mouth daily.    . medroxyPROGESTERone (DEPO-PROVERA) 150 MG/ML injection INJECT INTRAMUSCULARLY AS DIRECTED 1 mL 2  . Multiple Vitamin (MULTIVITAMIN WITH MINERALS) TABS tablet Take 1 tablet by mouth daily.    . pantoprazole (PROTONIX) 40 MG tablet Take 1 tablet (40 mg total) by mouth 2 (two) times daily. 60 tablet 0  . potassium chloride SA (K-DUR,KLOR-CON) 20 MEQ tablet Take 1 tablet (20 mEq total) by mouth daily. 3 tablet 0  . sucralfate (CARAFATE) 1 GM/10ML suspension Take 10 mLs (1 g total) by mouth 4 (four) times daily -  with meals and at bedtime. 420 mL 0  . valACYclovir (VALTREX) 1000 MG tablet Take 1 tablet (1,000 mg total) by mouth 3 (three) times daily. 21 tablet 0  . cephALEXin (KEFLEX) 500 MG capsule Take 500 mg by mouth 3 (three) times daily. Reported on 10/13/2015    . Guaifenesin 1200 MG TB12 Take 1 tablet (1,200 mg total) by mouth 2 (two) times daily. (Patient not taking: Reported on 10/10/2015) 20 each 0  . ibuprofen (ADVIL,MOTRIN) 800 MG tablet Take one three times a day with food for one week (Patient not taking: Reported on 10/10/2015) 30 tablet 1   No current facility-administered medications on file prior to visit.    Review of Systems:  As per HPI- otherwise negative.   Physical Examination: Filed Vitals:   10/13/15  0950  BP: 130/86  Pulse: 90  Temp: 98.6 F (37 C)  Resp: 16   Filed Vitals:   10/13/15 0950  Height: 5' 2.25" (1.581 m)  Weight: 156 lb (70.761 kg)   Body mass index is 28.31 kg/(m^2). Ideal Body Weight: Weight in (lb) to have BMI = 25: 137.5  GEN: WDWN, NAD, Non-toxic, A & O x 3, overweight, looks well Sometimes tearful during visit but felt better once we made a plan  HEENT: Atraumatic, Normocephalic. Neck supple. No masses, No LAD.  Bilateral TM wnl, oropharynx normal.  PEERL,EOMI.   Ears and Nose: No external deformity. CV: RRR, No M/G/R. No JVD. No thrill. No extra heart sounds. PULM: CTA B, no wheezes, crackles, rhonchi. No retractions. No resp. distress. No accessory muscle use. ABD: S,  NT, ND, +BS. No rebound. No HSM.  She is tender to touch over the left anterior, lateral and posterior ribs  No skin lesion is visible.  Belly is benign EXTR: No c/c/e NEURO Normal gait.  PSYCH: Normally interactive. Conversant. Not depressed or anxious appearing.  Calm demeanor.    Assessment and Plan: Left sided abdominal pain - Plan: lidocaine (LIDODERM) 5 %, gabapentin (NEURONTIN) 300 MG capsule  Enlarged thoracic aorta (HCC) - Plan: Ambulatory referral to Vascular Surgery  Here today with persistent left sided trunk pain. She has had several unrevealing ER visits.  This does not appear to be anything dangerous.  She feels that her skin itself is sore and sensitive.  Will try treating for a nerve pain with neurontin and lidoderm patches as needed. She will let me know if not feeling better See patient instructions for more details.     Signed Lamar Blinks, MD

## 2015-10-14 ENCOUNTER — Telehealth: Payer: Self-pay

## 2015-10-14 NOTE — Telephone Encounter (Signed)
Spoke with pt, she would like to know if she can take Tylenol-codeine with her Gabapentin. She would also like to know if she can take any other pain relievers with this medication.

## 2015-10-14 NOTE — Telephone Encounter (Signed)
Pt would like to speak with someone regarding her medication. Please call [phone] was seen yesterday

## 2015-10-14 NOTE — Telephone Encounter (Signed)
Called her back- yes, that is ok.  It may cause her to feel a bit more sleepy.  She does feel that the patches helped and that the gabapentin may be helping her some already.

## 2015-10-15 ENCOUNTER — Telehealth: Payer: Self-pay

## 2015-10-15 NOTE — Telephone Encounter (Signed)
Pt states that she is having side effects (side pain, hot/burning feet) from Gabapentin/// please contact to advise whether dosage change may be neccessary   [phone] Please contact to advise

## 2015-10-16 ENCOUNTER — Telehealth: Payer: Self-pay | Admitting: Family Medicine

## 2015-10-16 NOTE — Telephone Encounter (Signed)
Called her back but had to [facility]- was she ok prior to increasing her dose? If so back down to last dose that she tolerated well.  Please contact me if other questions

## 2015-10-16 NOTE — Telephone Encounter (Signed)
Patient started medication Gabapentin 300mg  3 times daily on Monday. She started with 1 pill Monday, 2 pills on Tuesday, 3 pills on Wednesday she started feeling side effects yesterday. She states the left side back of her head has a tingling/crawling sensation. She states it is close to the left middle on the back of the head. The sensation last for more than 1 hour coming and going. She thinks this is a reaction to the medication and wants to know if she needs to make any changes to the medication dose. She states the nerve pain is reducing so she thinks it is helping.

## 2015-10-17 ENCOUNTER — Telehealth: Payer: Self-pay | Admitting: Family Medicine

## 2015-10-17 NOTE — Telephone Encounter (Signed)
Called her back- she does feel like her pain is better.  Also her lidocaine patches seem to be helping.  Let her know that if she does better with just 1-2 pills of gabapentin a day that is certainly fine.  She will keep me posted

## 2015-10-17 NOTE — Telephone Encounter (Addendum)
Thanks Kristi- I sorry that I did not get this call and that you were disturbed on vacation.  I did speak to this pt this morning. Happy new year and thanks  Bethany

## 2015-10-17 NOTE — Telephone Encounter (Signed)
Patient called this morning at 7:00am reporting possible reaction to Gabapentin.   During the night, developed lower R abdominal pain; also having L lower abdominal/side pain.  Onset last night.  No fever/chills; did get hot very short duration through the night.  No n/v/d.  +Mild constipation. No dysuria, urgency, hematuria; not pregnant.   Has mild sensation in back of head; no pain; tingling sensation; intermittent; currently not present.  A/P: B lower abdominal pain with mild constipation and tingling occipital region of head: not clear that current symptoms due to Gabapentin; advised patient to hold Gabapentin until further recommendations from Dr. Lorelei Pont. Recommend increased water intake throughout the day to help with constipation.  I see that Dr. Lorelei Pont just spoke with patient regarding her symptoms. To [place] FYI.

## 2015-10-18 ENCOUNTER — Emergency Department (HOSPITAL_COMMUNITY): Payer: BC Managed Care – PPO

## 2015-10-18 ENCOUNTER — Emergency Department (HOSPITAL_COMMUNITY)
Admission: EM | Admit: 2015-10-18 | Discharge: 2015-10-18 | Disposition: A | Discharge status: Home / Self Care | Payer: BC Managed Care – PPO | Attending: Emergency Medicine | Admitting: Emergency Medicine

## 2015-10-18 DIAGNOSIS — Z8669 Personal history of other diseases of the nervous system and sense organs: Secondary | ICD-10-CM | POA: Insufficient documentation

## 2015-10-18 DIAGNOSIS — R350 Frequency of micturition: Secondary | ICD-10-CM | POA: Insufficient documentation

## 2015-10-18 DIAGNOSIS — J45909 Unspecified asthma, uncomplicated: Secondary | ICD-10-CM | POA: Insufficient documentation

## 2015-10-18 DIAGNOSIS — R1084 Generalized abdominal pain: Secondary | ICD-10-CM | POA: Insufficient documentation

## 2015-10-18 DIAGNOSIS — Z880 Allergy status to penicillin: Secondary | ICD-10-CM | POA: Insufficient documentation

## 2015-10-18 DIAGNOSIS — I10 Essential (primary) hypertension: Secondary | ICD-10-CM | POA: Diagnosis not present

## 2015-10-18 DIAGNOSIS — K5900 Constipation, unspecified: Secondary | ICD-10-CM | POA: Diagnosis not present

## 2015-10-18 DIAGNOSIS — Z3202 Encounter for pregnancy test, result negative: Secondary | ICD-10-CM | POA: Insufficient documentation

## 2015-10-18 DIAGNOSIS — Z79899 Other long term (current) drug therapy: Secondary | ICD-10-CM | POA: Insufficient documentation

## 2015-10-18 DIAGNOSIS — R109 Unspecified abdominal pain: Secondary | ICD-10-CM

## 2015-10-18 LAB — COMPREHENSIVE METABOLIC PANEL
ALT: 41 U/L (ref 14–54)
AST: 20 U/L (ref 15–41)
Albumin: 3.9 g/dL (ref 3.5–5.0)
Alkaline Phosphatase: 65 U/L (ref 38–126)
Anion gap: 6 (ref 5–15)
BILIRUBIN TOTAL: 0.4 mg/dL (ref 0.3–1.2)
BUN: 15 mg/dL (ref 6–20)
CO2: 26 mmol/L (ref 22–32)
CREATININE: 0.75 mg/dL (ref 0.44–1.00)
Calcium: 9.1 mg/dL (ref 8.9–10.3)
Chloride: 107 mmol/L (ref 101–111)
Glucose, Bld: 100 mg/dL — ABNORMAL HIGH (ref 65–99)
POTASSIUM: 3.6 mmol/L (ref 3.5–5.1)
Sodium: 139 mmol/L (ref 135–145)
TOTAL PROTEIN: 7.5 g/dL (ref 6.5–8.1)

## 2015-10-18 LAB — URINALYSIS, ROUTINE W REFLEX MICROSCOPIC
Bilirubin Urine: NEGATIVE
Glucose, UA: NEGATIVE mg/dL
Hgb urine dipstick: NEGATIVE
KETONES UR: NEGATIVE mg/dL
LEUKOCYTES UA: NEGATIVE
NITRITE: NEGATIVE
PROTEIN: NEGATIVE mg/dL
Specific Gravity, Urine: 1.022 (ref 1.005–1.030)
pH: 6 (ref 5.0–8.0)

## 2015-10-18 LAB — CBC
HCT: 38.2 % (ref 36.0–46.0)
Hemoglobin: 12.7 g/dL (ref 12.0–15.0)
MCH: 29.4 pg (ref 26.0–34.0)
MCHC: 33.2 g/dL (ref 30.0–36.0)
MCV: 88.4 fL (ref 78.0–100.0)
Platelets: 313 K/uL (ref 150–400)
RBC: 4.32 MIL/uL (ref 3.87–5.11)
RDW: 12.5 % (ref 11.5–15.5)
WBC: 5.9 K/uL (ref 4.0–10.5)

## 2015-10-18 LAB — LIPASE, BLOOD: Lipase: 39 U/L (ref 11–51)

## 2015-10-18 LAB — POC URINE PREG, ED: Preg Test, Ur: NEGATIVE

## 2015-10-18 MED ORDER — MORPHINE SULFATE (PF) 4 MG/ML IV SOLN
4.0000 mg | Freq: Once | INTRAVENOUS | Status: AC
  Administered 2015-10-18: 4 mg via INTRAMUSCULAR
  Filled 2015-10-18: qty 1

## 2015-10-18 MED ORDER — MORPHINE SULFATE (PF) 4 MG/ML IV SOLN: 4.0000 mg | Freq: Once | INTRAVENOUS | Status: DC

## 2015-10-18 MED ORDER — DOCUSATE SODIUM 50 MG/5ML PO LIQD: 50.0000 mg | Freq: Every day | ORAL | Status: DC

## 2015-10-18 NOTE — Discharge Instructions (Signed)
1. Medications: usual home medications 2. Treatment: rest, drink plenty of fluids  3. Follow Up: please followup with your primary doctor in 2-3 days for discussion of your diagnoses and further evaluation after today's visit; if you do not have a primary care doctor use the resource guide provided to find one; please return to the ER for high fever, severe abdominal pain, persistent vomiting, new or worsening symptoms   Abdominal Pain, Adult Many things can cause abdominal pain. Usually, abdominal pain is not caused by a disease and will improve without treatment. It can often be observed and treated at home. Your health care provider will do a physical exam and possibly order blood tests and X-rays to help determine the seriousness of your pain. However, in many cases, more time must pass before a clear cause of the pain can be found. Before that point, your health care provider may not know if you need more testing or further treatment. HOME CARE INSTRUCTIONS Monitor your abdominal pain for any changes. The following actions may help to alleviate any discomfort you are experiencing:  Only take over-the-counter or prescription medicines as directed by your health care provider.  Do not take laxatives unless directed to do so by your health care provider.  Try a clear liquid diet (broth, tea, or water) as directed by your health care provider. Slowly move to a bland diet as tolerated. SEEK MEDICAL CARE IF:  You have unexplained abdominal pain.  You have abdominal pain associated with nausea or diarrhea.  You have pain when you urinate or have a bowel movement.  You experience abdominal pain that wakes you in the night.  You have abdominal pain that is worsened or improved by eating food.  You have abdominal pain that is worsened with eating fatty foods.  You have a fever. SEEK IMMEDIATE MEDICAL CARE IF:  Your pain does not go away within 2 hours.  You keep throwing up  (vomiting).  Your pain is felt only in portions of the abdomen, such as the right side or the left lower portion of the abdomen.  You pass bloody or black tarry stools. MAKE SURE YOU:  Understand these instructions.  Will watch your condition.  Will get help right away if you are not doing well or get worse.   This information is not intended to replace advice given to you by your health care provider. Make sure you discuss any questions you have with your health care provider.   Document Released: 07/14/2005 Document Revised: 06/25/2015 Document Reviewed: 06/13/2013 Elsevier Interactive Patient Education [employer].

## 2015-10-18 NOTE — ED Notes (Signed)
Pt c/o generalized aching abdominal pain x 3 days with constipation. Pt denies taking anything for the constipation. Last normal bowel movement yesterday per pt report. Pt is alert and oriented x4 and in no acute distress.

## 2015-10-18 NOTE — Telephone Encounter (Signed)
Pt called again at 12:30 am stating that she just had another episode of pain that lasted about 15 minutes. She now feels better.  She has called [place] 4x in the past 2 days for this issue.  Advised that when we are closed the only option to acutely evaluate pain would be the ER and that she can be evaluated there if she is worried.  She plans to do so.

## 2015-10-18 NOTE — ED Provider Notes (Signed)
CSN: [id number]     Arrival date & time 10/18/15  0330 History   First MD Initiated Contact with Patient 10/18/15 0559     Chief Complaint  Patient presents with  . Abdominal Pain    HPI   Megan Lang is a 44 y.o. female with a PMH of HTN, asthma, cardiomegaly who presents to the ED with abdominal pain x 2 days. She reports intermittent generalized abdominal pain. She denies exacerbating factors. She states she has been taking her acid reflux medication with minimal symptom relief. She denies fever, chills, nausea, vomiting, diarrhea. She states she was previously constipated, however had a normal bowel movement yesterday. She denies hematochezia or melena. She reports urinary frequency, though denies dysuria or urgency.   Past Medical History  Diagnosis Date  . Normal spontaneous vaginal delivery     3  . Hypertension   . Asthma   . Allergy   . Cardiomegaly   . Carpal tunnel syndrome    Past Surgical History  Procedure Laterality Date  . Carpal tunnel release  2009  . Carpal tunnel release Left    Family History  Problem Relation Age of Onset  . Hypertension Mother   . Cancer Father     COLON  . Breast cancer Maternal Grandmother   . Hypertension Sister    Social History  Substance Use Topics  . Smoking status: Never Smoker   . Smokeless tobacco: Never Used  . Alcohol Use: 0.0 oz/week    0 Standard drinks or equivalent per week     Comment: OCC- WINE   OB History    Gravida Para Term Preterm AB TAB SAB Ectopic Multiple Living   3 3 3       3       Review of Systems  Constitutional: Negative for fever and chills.  Gastrointestinal: Positive for abdominal pain and constipation. Negative for nausea, vomiting, diarrhea and blood in stool.  Genitourinary: Positive for frequency. Negative for dysuria and urgency.  All other systems reviewed and are negative.     Allergies  Minocin and Penicillins  Home Medications   Prior to Admission medications    Medication Sig Start Date End Date Taking? Authorizing Provider  bisoprolol-hydrochlorothiazide (ZIAC) 2.5-6.25 MG tablet Take 1 tablet by mouth daily.   Yes Historical Provider, MD  carboxymethylcellulose (REFRESH PLUS) 0.5 % SOLN Place 1 drop into both eyes 3 (three) times daily as needed (for eye irritation).   Yes Historical Provider, MD  gabapentin (NEURONTIN) 300 MG capsule Take 1 capsule (300 mg total) by mouth 3 (three) times daily. 10/13/15  Yes Jessica C Copland, MD  lidocaine (LIDODERM) 5 % Apply up to 3 patches to most painful areas.  May cut to size.  Wear no more than 12 hours a day 10/13/15  Yes Gay Filler Copland, MD  medroxyPROGESTERone (DEPO-PROVERA) 150 MG/ML injection INJECT INTRAMUSCULARLY AS DIRECTED 03/05/15  Yes Terrance Mass, MD  pantoprazole (PROTONIX) 40 MG tablet Take 1 tablet (40 mg total) by mouth 2 (two) times daily. 10/09/15  Yes Hannah Muthersbaugh, PA-C  acetaminophen-codeine 120-12 MG/5ML solution Take 10 mLs by mouth every 4 (four) hours as needed for moderate pain. Patient not taking: Reported on 10/18/2015 10/10/15   Dalia Heading, PA-C  docusate (COLACE) 50 MG/5ML liquid Take 5 mLs (50 mg total) by mouth daily. 10/18/15   Marella Chimes, PA-C  Guaifenesin 1200 MG TB12 Take 1 tablet (1,200 mg total) by mouth 2 (two) times daily. Patient not  taking: Reported on 10/10/2015 10/10/15   Dalia Heading, PA-C  potassium chloride SA (K-DUR,KLOR-CON) 20 MEQ tablet Take 1 tablet (20 mEq total) by mouth daily. Patient not taking: Reported on 10/18/2015 06/17/15   Domenic Moras, PA-C  sucralfate (CARAFATE) 1 GM/10ML suspension Take 10 mLs (1 g total) by mouth 4 (four) times daily -  with meals and at bedtime. Patient not taking: Reported on 10/18/2015 10/09/15   Jarrett Soho Muthersbaugh, PA-C  valACYclovir (VALTREX) 1000 MG tablet Take 1 tablet (1,000 mg total) by mouth 3 (three) times daily. Patient not taking: Reported on 10/18/2015 10/03/15   Gay Filler Copland,  MD    BP 144/95 mmHg  Pulse 89  Temp(Src) 98.6 F (37 C) (Oral)  Resp 18  Ht 5\' 2"  (1.575 m)  Wt 70.308 kg  BMI 28.34 kg/m2  SpO2 100% Physical Exam  Constitutional: She is oriented to person, place, and time. She appears well-developed and well-nourished. No distress.  HENT:  Head: Normocephalic and atraumatic.  Right Ear: External ear normal.  Left Ear: External ear normal.  Nose: Nose normal.  Mouth/Throat: Uvula is midline, oropharynx is clear and moist and mucous membranes are normal.  Eyes: Conjunctivae, EOM and lids are normal. Pupils are equal, round, and reactive to light. Right eye exhibits no discharge. Left eye exhibits no discharge. No scleral icterus.  Neck: Normal range of motion. Neck supple.  Cardiovascular: Normal rate, regular rhythm, normal heart sounds, intact distal pulses and normal pulses.   Pulmonary/Chest: Effort normal and breath sounds normal. No respiratory distress. She has no wheezes. She has no rales.  Abdominal: Soft. Normal appearance and bowel sounds are normal. She exhibits no distension and no mass. There is tenderness. There is no rigidity, no rebound, no guarding and no CVA tenderness.  Mild diffuse TTP. No rebound, guarding, or masses.  Musculoskeletal: Normal range of motion. She exhibits no edema or tenderness.  Neurological: She is alert and oriented to person, place, and time.  Skin: Skin is warm, dry and intact. No rash noted. She is not diaphoretic. No erythema. No pallor.  Psychiatric: She has a normal mood and affect. Her speech is normal and behavior is normal.  Nursing note and vitals reviewed.   ED Course  Procedures (including critical care time)  Labs Review Labs Reviewed  COMPREHENSIVE METABOLIC PANEL - Abnormal; Notable for the following:    Glucose, Bld 100 (*)    All other components within normal limits  LIPASE, BLOOD  CBC  URINALYSIS, ROUTINE W REFLEX MICROSCOPIC (NOT AT [facility])  POC URINE PREG, ED    Imaging  Review Dg Abd Acute W/chest  10/18/2015  CLINICAL DATA:  Acute onset of generalized abdominal pain and constipation. Initial encounter. EXAM: DG ABDOMEN ACUTE W/ 1V CHEST COMPARISON:  CTA of the chest performed 10/10/2015, and CT of the abdomen and pelvis from 07/24/2015 FINDINGS: The lungs are well-aerated and clear. There is no evidence of focal opacification, pleural effusion or pneumothorax. The cardiomediastinal silhouette is borderline normal in size. The visualized bowel gas pattern is unremarkable. Scattered stool and air are seen within the colon; there is no evidence of small bowel dilatation to suggest obstruction. No free intra-abdominal air is identified on the provided upright view. No acute osseous abnormalities are seen; the sacroiliac joints are unremarkable in appearance. IMPRESSION: 1. Unremarkable bowel gas pattern; no free intra-abdominal air seen. Moderate amount of stool noted in the colon. 2. No acute cardiopulmonary process seen. Electronically Signed   By: Garald Balding  M.D.   On: 10/18/2015 07:04   I have personally reviewed and evaluated these images and lab results as part of my medical decision-making.   EKG Interpretation None      MDM   Final diagnoses:  Abdominal pain    44 year old female presents with intermittent generalized abdominal pain x 2 days. Denies fever, chills, nausea, vomiting, diarrhea. States she was previously constipated, however had a normal bowel movement yesterday. Denies hematochezia or melena. Reports urinary frequency, though denies dysuria or urgency.  Patient is afebrile. Vital signs stable. Abdomen soft, non-distended, with mild diffuse TTP. No rebound, guarding, or masses. No CVA tenderness.  CBC negative for leukocytosis or anemia. CMP unremarkable. Lipase within normal limits. UA negative for infection. Urine pregnancy negative. Plain film of abdomen reveals unremarkable bowel gas pattern, scattered stool and air, no evidence of  small bowel dilatation to suggest obstruction, no free intra-abdominal air.  Patient given 1 dose of pain medication and reports significant symptom improvement. Repeat abdominal exam benign. Patient is nontoxic and well-appearing, feel she is stable for discharge at this time. Will give stool softener for symptoms. Patient to follow up with PCP for further evaluation and management. Return precautions discussed at length. Patient verbalizes her understanding and is in agreement with plan.  BP 144/95 mmHg  Pulse 89  Temp(Src) 98.6 F (37 C) (Oral)  Resp 18  Ht 5\' 2"  (1.575 m)  Wt 70.308 kg  BMI 28.34 kg/m2  SpO2 100%    Marella Chimes, PA-C 10/18/15 1611  April Palumbo, MD 10/18/15 2310

## 2015-10-19 ENCOUNTER — Telehealth: Payer: Self-pay | Admitting: Family Medicine

## 2015-10-19 HISTORY — PX: COLONOSCOPY: SHX5424

## 2015-10-19 NOTE — Telephone Encounter (Signed)
Pt called this am at 0700- this is the 6th consecutive day that she has called about her abd pain (of more than one month duration).  She went to the ER as I suggested the day prior but "they did not find out what was wrong."  She states that nothing is currently different about her pain, pain is currently minimal, and that she was calling "to find out what is the next step."  Advised her that for routine matters and questions we would ask her not to use the after hours emergency line as this is for urgent matters.  Asked her to please come and see [place] today when the office opens or to see me tomorrow in clinic and she agreed,

## 2015-10-20 ENCOUNTER — Ambulatory Visit (INDEPENDENT_AMBULATORY_CARE_PROVIDER_SITE_OTHER): Payer: BC Managed Care – PPO | Admitting: Family Medicine

## 2015-10-20 VITALS — BP 120/80 | HR 85 | Temp 98.8°F | Resp 18 | Ht 64.0 in | Wt 160.8 lb

## 2015-10-20 DIAGNOSIS — R1013 Epigastric pain: Secondary | ICD-10-CM | POA: Diagnosis not present

## 2015-10-20 DIAGNOSIS — G8929 Other chronic pain: Secondary | ICD-10-CM | POA: Diagnosis not present

## 2015-10-20 NOTE — Progress Notes (Signed)
[facility] [address], [place] [address] [phone]  Date:  10/20/2015   Name:  Megan Lang   DOB:  [birth year]   MRN:  [id number]  PCP:  Lamar Blinks, MD    Chief Complaint: Follow-up   History of Present Illness:  Megan Lang is a 45 y.o. very pleasant female patient who presents with the following:  Here today to recheck epigastric pain and pain in her left chests wall.  I first saw her for this issue 12/16- however she has had similar complaints since October of 2016.    She had a CT abd pelvis in October and a CT angiogram in December.  She has had numerous ER visits in [place] and also [place] with extensive labwork.   Her most recent visit here was about one week ago on 12/26 at which time we decided to try neurontin and lidoderm patches for her pain. She then called [place] daily on 12/27, 12/28. 12/29/ 12/30, and 10/20/15 for this issue.  She was also see at the [facility] ER on 12/31- the history from that visit states she had noted abd pain for 2 days, her labs and plain films were reassuring and she improved with pain medication in the ER.  She was released to home.  She called me that next day on the after hours line to ask about the next step. I asked her to come and see me in clinic so she is here today.  She also went to the [facility] ER on 1/1.  She states that she was told to see GI for a possible ulcer.   She states that she still has the left rib pain and is taking the gabapentin that I gave her twice a day.  She was not able to tolerate TID.  She is also using lidocaine patches.  She states that this did seem to help but as of the last 2 days her pain is back.  She also states that she is having abd pain. "real gassy. Bloated.  I've been having like sharp pains. Burning sensation after I eat. Constipation. I got a lot of gas and a lot of burping."  States she has not had a BM in 3 days.    She started a stool softener yesterday, and also protonix BID.    She is able to eat normally Negative h pylori screening in October  Wt Readings from Last 3 Encounters:  10/20/15 160 lb 12.8 oz (72.938 kg)  10/18/15 155 lb (70.308 kg)  10/13/15 156 lb (70.761 kg)     Patient Active Problem List   Diagnosis Date Noted  . Costochondritis 08/26/2015  . Obesity 07/01/2015  . Upper airway cough syndrome 06/30/2015  . History of shingles 01/26/2015  . Family history of colon cancer 10/27/2011  . Essential hypertension 10/27/2011    Past Medical History  Diagnosis Date  . Normal spontaneous vaginal delivery     3  . Hypertension   . Asthma   . Allergy   . Cardiomegaly   . Carpal tunnel syndrome     Past Surgical History  Procedure Laterality Date  . Carpal tunnel release  2009  . Carpal tunnel release Left     Social History  Substance Use Topics  . Smoking status: Never Smoker   . Smokeless tobacco: Never Used  . Alcohol Use: 0.0 oz/week    0 Standard drinks or equivalent per week     Comment:  OCC- WINE    Family History  Problem Relation Age of Onset  . Hypertension Mother   . Cancer Father     COLON  . Breast cancer Maternal Grandmother   . Hypertension Sister     Allergies  Allergen Reactions  . Minocin [Minocycline Hcl] Other (See Comments)    Stomach Pains  . Penicillins Rash    Has patient had a PCN reaction causing immediate rash, facial/tongue/throat swelling, SOB or lightheadedness with hypotension:yes Has patient had a PCN reaction causing severe rash involving mucus membranes or skin necrosis:no Has patient had a PCN reaction that required hospitalization:no Has patient had a PCN reaction occurring within the last 10 years:No If all of the above answers are "NO", then may proceed with Cephalosporin    Medication list has been reviewed and updated.  Current Outpatient Prescriptions on File Prior to Visit  Medication Sig Dispense Refill  . bisoprolol-hydrochlorothiazide (ZIAC) 2.5-6.25 MG tablet Take 1  tablet by mouth daily.    . carboxymethylcellulose (REFRESH PLUS) 0.5 % SOLN Place 1 drop into both eyes 3 (three) times daily as needed (for eye irritation).    Marland Kitchen docusate (COLACE) 50 MG/5ML liquid Take 5 mLs (50 mg total) by mouth daily. 100 mL 0  . gabapentin (NEURONTIN) 300 MG capsule Take 1 capsule (300 mg total) by mouth 3 (three) times daily. 90 capsule 3  . lidocaine (LIDODERM) 5 % Apply up to 3 patches to most painful areas.  May cut to size.  Wear no more than 12 hours a day 30 patch 1  . medroxyPROGESTERone (DEPO-PROVERA) 150 MG/ML injection INJECT INTRAMUSCULARLY AS DIRECTED 1 mL 2  . pantoprazole (PROTONIX) 40 MG tablet Take 1 tablet (40 mg total) by mouth 2 (two) times daily. 60 tablet 0  . acetaminophen-codeine 120-12 MG/5ML solution Take 10 mLs by mouth every 4 (four) hours as needed for moderate pain. (Patient not taking: Reported on 10/18/2015) 120 mL 0  . Guaifenesin 1200 MG TB12 Take 1 tablet (1,200 mg total) by mouth 2 (two) times daily. (Patient not taking: Reported on 10/10/2015) 20 each 0  . potassium chloride SA (K-DUR,KLOR-CON) 20 MEQ tablet Take 1 tablet (20 mEq total) by mouth daily. (Patient not taking: Reported on 10/18/2015) 3 tablet 0  . sucralfate (CARAFATE) 1 GM/10ML suspension Take 10 mLs (1 g total) by mouth 4 (four) times daily -  with meals and at bedtime. (Patient not taking: Reported on 10/18/2015) 420 mL 0  . valACYclovir (VALTREX) 1000 MG tablet Take 1 tablet (1,000 mg total) by mouth 3 (three) times daily. (Patient not taking: Reported on 10/18/2015) 21 tablet 0   No current facility-administered medications on file prior to visit.    Review of Systems:  As per HPI- otherwise negative.   Physical Examination: Filed Vitals:   10/20/15 0932  BP: 120/80  Pulse: 85  Temp: 98.8 F (37.1 C)  Resp: 18   Filed Vitals:   10/20/15 0932  Height: 5\' 4"  (1.626 m)  Weight: 160 lb 12.8 oz (72.938 kg)   Body mass index is 27.59 kg/(m^2). Ideal Body  Weight: Weight in (lb) to have BMI = 25: 145.3  GEN: WDWN, NAD, Non-toxic, A & O x 3, mild overweight, looks well HEENT: Atraumatic, Normocephalic. Neck supple. No masses, No LAD. Ears and Nose: No external deformity. CV: RRR, No M/G/R. No JVD. No thrill. No extra heart sounds. PULM: CTA B, no wheezes, crackles, rhonchi. No retractions. No resp. distress. No accessory muscle use. ABD:  S,  ND, +BS. No rebound. No HSM.  Increased bowel sounds. Epigastric tenderness  EXTR: No c/c/e NEURO Normal gait.  PSYCH: Normally interactive. Conversant. Not depressed or anxious appearing.  Calm demeanor.    Assessment and Plan: Abdominal pain, chronic, epigastric - Plan: CANCELED: Ambulatory referral to Gastroenterology  She has an appt already scheduled to see GI in about 2 weeks- she will call their office tomorrow (closed today) and see if she can move this up.  Discussed gabapentin and lidocaine patches, and how to manage her stool softener See patient instructions for more details.     Signed Lamar Blinks, MD

## 2015-10-20 NOTE — Patient Instructions (Signed)
We are going to set you up to see a gastroenterologist asap. In the meantime continue to take the protonix twice a day and a daily stool softener as needed for constipation.  I am glad to talk with you about routine concerns either on the phone or in the office- please call during business hours to leave a message or send a mychart message. If you have an EMERGENCY which means an acute worsening of your pain, change in your symptoms or other acute concern please call the after hours emergency line or go to the ER.  Otherwise you likely do not need to continue visiting the ER every couple of days because I doubt that new information will be found.

## 2015-10-21 ENCOUNTER — Telehealth: Payer: Self-pay

## 2015-10-21 NOTE — Telephone Encounter (Signed)
Pt would like to speak with someone about referring her to a specialist for her tummy. Please call [phone]

## 2015-10-22 ENCOUNTER — Institutional Professional Consult (permissible substitution) (INDEPENDENT_AMBULATORY_CARE_PROVIDER_SITE_OTHER): Payer: BC Managed Care – PPO | Admitting: Cardiothoracic Surgery

## 2015-10-22 ENCOUNTER — Encounter: Payer: Self-pay | Admitting: Cardiothoracic Surgery

## 2015-10-22 ENCOUNTER — Telehealth: Payer: Self-pay | Admitting: Internal Medicine

## 2015-10-22 VITALS — BP 149/90 | HR 88 | Resp 20 | Ht 64.0 in | Wt 160.0 lb

## 2015-10-22 DIAGNOSIS — I712 Thoracic aortic aneurysm, without rupture, unspecified: Secondary | ICD-10-CM

## 2015-10-22 NOTE — Telephone Encounter (Signed)
lmtcb X1 for pt  

## 2015-10-22 NOTE — Progress Notes (Signed)
PCP is Lamar Blinks, MD Referring Provider is Copland, Gay Filler, MD  Chief Complaint  Patient presents with  . Thoracic Aortic Aneurysm    Surgical eval CTA Chest 10/10/15  4.1 cm fusiform ascending aortic aneurysm  HPI:  45 year old  AA female hypertensive nonsmoker presents with recent CT scan from the ED showing a mild fusiform ascending aneurysm. This was taken with the patient presented with some atypical pain and some shortness of breath. The patient had recent sinus surgery for chronic sinusitis. She also has history of GERD- irritable bowel disease.  No family history of thoracic or abdominal aortic aneurysm disease The patient has never smoked She has history of asthma since childhood and is followed by pulmonary medicine.  No past history of heart disease, rheumatic fever, cardiac murmur, cardiac arrhythmia.  Current Outpatient Prescriptions  Medication Sig Dispense Refill  . bisoprolol-hydrochlorothiazide (ZIAC) 2.5-6.25 MG tablet Take 1 tablet by mouth daily.    . carboxymethylcellulose (REFRESH PLUS) 0.5 % SOLN Place 1 drop into both eyes 3 (three) times daily as needed (for eye irritation).    Marland Kitchen docusate (COLACE) 50 MG/5ML liquid Take 5 mLs (50 mg total) by mouth daily. 100 mL 0  . gabapentin (NEURONTIN) 300 MG capsule Take 1 capsule (300 mg total) by mouth 3 (three) times daily. 90 capsule 3  . lidocaine (LIDODERM) 5 % Apply up to 3 patches to most painful areas.  May cut to size.  Wear no more than 12 hours a day 30 patch 1  . medroxyPROGESTERone (DEPO-PROVERA) 150 MG/ML injection INJECT INTRAMUSCULARLY AS DIRECTED 1 mL 2  . pantoprazole (PROTONIX) 40 MG tablet Take 1 tablet (40 mg total) by mouth 2 (two) times daily. 60 tablet 0  . sucralfate (CARAFATE) 1 GM/10ML suspension Take 10 mLs (1 g total) by mouth 4 (four) times daily -  with meals and at bedtime. 420 mL 0   No current facility-administered medications for this visit.    Allergies  Allergen Reactions  .  Minocin [Minocycline Hcl] Other (See Comments)    Stomach Pains  . Penicillins Rash    Has patient had a PCN reaction causing immediate rash, facial/tongue/throat swelling, SOB or lightheadedness with hypotension:yes Has patient had a PCN reaction causing severe rash involving mucus membranes or skin necrosis:no Has patient had a PCN reaction that required hospitalization:no Has patient had a PCN reaction occurring within the last 10 years:No If all of the above answers are "NO", then may proceed with Cephalosporin    Review of Systems          Review of Systems :  [ y ] = yes, [  ] = no        General :  Weight gain [   ]    Weight loss  [   ]  Fatigue [  ]  Fever [  ]  Chills  [  ]                                Weakness  [  ]           Cardiac :  Chest pain/ pressure [ yes atypical ]  Resting SOB [  ] exertional SOB [ yes mild ]                        Orthopnea [  ]  Pedal edema  [  ]  Palpitations [yes intermittent  ] Syncope/presyncope [ ]                         Paroxysmal nocturnal dyspnea [  ]        Pulmonary : cough [place]  ]  wheezing [ yes-history asthma ]  Hemoptysis [  ] Sputum [  ] Snoring [  ]                              Pneumothorax [  ]  Sleep apnea [  ]       GI : Vomiting [  ]  Dysphagia [  ]  Melena  [  ]  Abdominal pain [ yes chronic ] BRBPR [  ]              Heart burn [yes GERD  ]  Constipation [ yes ] Diarrhea  [  ] Colonoscopy [  ]       GU : Hematuria [  ]  Dysuria [  ]  Nocturia [  ] UTI's [  ]       Vascular : Claudication [  ]  Rest pain [  ]  DVT [  ] Vein stripping [  ] leg ulcers [  ]                          TIA [  ] Stroke [  ]  Varicose veins [  ]       NEURO :  Headaches  [  ] Seizures [  ] Vision changes [  ] Paresthesias [  ]       Musculoskeletal :  Arthritis [  ] Gout  [  ]  Back pain [  ]  Joint pain [  ]       Skin :  Rash [  ]  Melanoma [  ]        Heme : Bleeding problems [  ]Clotting Disorders [  ] Anemia [  ]Blood Transfusion [ ]         Endocrine : Diabetes [ no ] Thyroid Disorder  [  ]       Psych : Depression [  ]  Anxiety [  ]  Psych hospitalizations [  ]                                               BP 149/90 mmHg  Pulse 88  Resp 20  Ht 5\' 4"  (1.626 m)  Wt 160 lb (72.576 kg)  BMI 27.45 kg/m2  SpO2 99% Physical Exam      Physical Exam  General: Well-nourished young AA female no distress but anxious HEENT: Normocephalic pupils equal , dentition adequate Neck: Supple without JVD, adenopathy, or bruit Chest: Clear to auscultation, symmetrical breath sounds, no rhonchi, no tenderness             or deformity Cardiovascular: Regular rate and rhythm, no murmur, no gallop, peripheral pulses             palpable in all extremities Abdomen:  Soft, nontender, no palpable mass or organomegaly Extremities: Warm, well-perfused, no clubbing cyanosis edema or tenderness,  no venous stasis changes of the legs Rectal/GU: Deferred Neuro: Grossly non--focal and symmetrical throughout Skin: Clean and dry without rash or ulceration   Diagnostic Tests: CT scan personally reviewed and counseled with patient She has mild fusiform aneurysmal dilatation of the ascending aorta  Impression: 4.1 cm diameter fusiform ascending aneurysm Surgical resection not recommended until diameter exceed 5.5 cm. Risk of dissection currently is less than 1% per year  Treatment recommendations--blood pressure control is the most important aspect of this patient's risk profile for dissection. This was discussed with the patient she was recommended to check her blood pressure frequently and to keep her blood pressure less than 150/90.  Serial surveillance scans of her ascending aorta are recommended and she will be scheduled for a CT scan with contrast in one year. Symptoms of acute dissection including severe tearing anterior precordial or posterior interscapular pain should be immediately evaluated at  closest emergency room.with  repeat CT scan.  Plan: Return for a repeat CT scan, surveillance of ascending fusiform aneurysm, and one year  Len Childs, MD [facility] [phone]

## 2015-10-23 ENCOUNTER — Telehealth: Payer: Self-pay | Admitting: Gastroenterology

## 2015-10-23 NOTE — Telephone Encounter (Signed)
Called her back to discuss.  She feels like the gabapentin is helping with the pain in her side but may be increasing her abd pain.  Advised that she can decrease the dose or stop if she prefers.  She is also not sure about taking miralax-? Is this ok with her other meds.  Advised that it is and I think it will help with her constipation  Encouraged her to see me in the office next week if she has any concerns pending her GI appt on 1/17

## 2015-10-23 NOTE — Telephone Encounter (Signed)
Patient Returned call °[phone] °

## 2015-10-23 NOTE — Telephone Encounter (Signed)
Patient called again about her stomach trouble. She states that she is having abdominal pain and constipation that she believes is coming from a medication she is taking for nerve pain.

## 2015-10-23 NOTE — Telephone Encounter (Signed)
If carafate helps her symptoms that's fine. I will reassess her in clinic soon as outlined. Thanks

## 2015-10-23 NOTE — Telephone Encounter (Signed)
LMOMTCB x 1 

## 2015-10-23 NOTE — Telephone Encounter (Signed)
Spoke with patient she is having problems with burning in upper abdomen. It is worse at night. She is taking her Protonix BID but has only been taking Carafate once daily. She will try increasing it to QID as ordered. She will keep her OV on 11/04/15.

## 2015-10-23 NOTE — Telephone Encounter (Signed)
lmtcb x1 for pt. 

## 2015-10-23 NOTE — Telephone Encounter (Signed)
Spoke with pt. She reports she has been having increase SOB at rest/exertion, wheezing, very little cough. Denies any chest tx. She reports she had sinus surgery about 3 weeks ago. Requesting appt. Please advise MW if she can be worked in on your schedule? thanks

## 2015-10-23 NOTE — Telephone Encounter (Signed)
Pt returning call.Megan Lang ° °

## 2015-10-23 NOTE — Telephone Encounter (Signed)
lmtcb x2 for pt. 

## 2015-10-23 NOTE — Telephone Encounter (Signed)
Patient Instructions     We are going to set you up to see a gastroenterologist asap. In the meantime continue to take the protonix twice a day and a daily stool softener as needed for constipation.  I am glad to talk with you about routine concerns either on the phone or in the office- please call during business hours to leave a message or send a mychart message. If you have an EMERGENCY which means an acute worsening of your pain, change in your symptoms or other acute concern please call the after hours emergency line or go to the ER. Otherwise you likely do not need to continue visiting the ER every couple of days because I doubt that new information will be found.    Spoke with pt, the medicine Gabapentin is helping with the pain but when she takes it, her stomach starts to hurt and a feeling of being sore. She is going to the bathroom but still not enough and feels she strains. She is taking the stool softener. She just does not know what else to do about the Gabapentin. Please advise. Dr. Lorelei Pont is not here so I will send to PA pool also.

## 2015-10-23 NOTE — Telephone Encounter (Signed)
As long as it's not related to obvious sinus issue like excess drainage (which would be an ent problem) that's fine  Rec: When return bring your medications in 2 separate bags, the ones you take no matter(automatically)  what vs the as needed (only when you feel you need them)

## 2015-10-24 MED ORDER — SUCRALFATE 1 GM/10ML PO SUSP: 1.0000 g | Freq: Three times a day (TID) | ORAL | Status: DC

## 2015-10-24 NOTE — Telephone Encounter (Signed)
Spoke with patient and she is worried she is not going to have enough Carafate to take until OV. Rx sent to pharmacy for patient.

## 2015-10-24 NOTE — Telephone Encounter (Signed)
Called spoke with pt. She scheduled appt for Monday at 10:15 AM with MW. Nothing further needed

## 2015-10-24 NOTE — Telephone Encounter (Signed)
Patient is calling in regarding this. [facility] on [address].

## 2015-10-24 NOTE — Telephone Encounter (Signed)
Left a message for patient to call back. 

## 2015-10-26 ENCOUNTER — Telehealth: Payer: Self-pay | Admitting: Thoracic Surgery (Cardiothoracic Vascular Surgery)

## 2015-10-26 ENCOUNTER — Encounter: Payer: Self-pay | Admitting: Thoracic Surgery (Cardiothoracic Vascular Surgery)

## 2015-10-26 NOTE — Telephone Encounter (Signed)
Patient called because of shortness of breath and mild dull intermittent pain in left shoulder.  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  I recommended that the patient go to the ED if symptoms were severe or seek evaluation by her primary care physician in the morning tomorrow.  Rexene Alberts, MD 10/26/2015 12:37 PM

## 2015-10-27 ENCOUNTER — Telehealth: Payer: Self-pay

## 2015-10-27 ENCOUNTER — Encounter: Payer: Self-pay | Admitting: Internal Medicine

## 2015-10-27 ENCOUNTER — Ambulatory Visit (INDEPENDENT_AMBULATORY_CARE_PROVIDER_SITE_OTHER): Payer: BC Managed Care – PPO | Admitting: Internal Medicine

## 2015-10-27 VITALS — BP 124/80 | HR 79 | Temp 98.2°F | Ht 62.0 in | Wt 161.2 lb

## 2015-10-27 DIAGNOSIS — R05 Cough: Secondary | ICD-10-CM

## 2015-10-27 DIAGNOSIS — R058 Other specified cough: Secondary | ICD-10-CM

## 2015-10-27 DIAGNOSIS — R0600 Dyspnea, unspecified: Secondary | ICD-10-CM

## 2015-10-27 NOTE — Assessment & Plan Note (Signed)
-   d/c all inhalers 06/30/2015  - allergy profile 06/30/2015 >  Eos 0.1,   Ig E < 2/ neg RAST  - sinus CT 07/18/2015 >Minimal chronic changes of the right maxillary sinus. Complete opacification of the left maxillary sinus which could be acute or Chronic> rec  Minocycline 100 mg bid x 10 days since pcn allergic  Then   ent eval > shoemaker eval 07/22/15 rec septoplasty> done 10/02/15 - Spirometry during flare of sob/ wheeze 10/27/2015 completely wnl   Hx and exam c/w  Classic Upper airway cough syndrome, so named because it's frequently impossible to sort out how much is  CR/sinusitis with freq throat clearing (which can be related to primary GERD)   vs  causing  secondary (" extra esophageal")  GERD from wide swings in gastric pressure that occur with throat clearing, often  promoting self use of mint and menthol lozenges that reduce the lower esophageal sphincter tone and exacerbate the problem further in a cyclical fashion.   These are the same pts (now being labeled as having "irritable larynx syndrome" by some cough centers) who not infrequently have a history of having failed to tolerate ace inhibitors,  dry powder inhalers or biphosphonates or report having atypical reflux symptoms that don't respond to standard doses of PPI , and are easily confused as having aecopd or asthma flares by even experienced allergists/ pulmonologists.   rec max rx for gerd/ cyclical cough and f/u by ENT and GI but no pulmonary f/u needed

## 2015-10-27 NOTE — Assessment & Plan Note (Signed)
Spirometry 10/27/2015 during flare of noct cough/wheeze/ sob is wnl including fef 25-75 c/w uacs

## 2015-10-27 NOTE — Patient Instructions (Signed)
Continue pantoprazole Take 30- 60 min before your first and last meals of the day   GERD (REFLUX)  is an extremely common cause of respiratory symptoms just like yours , many times with no obvious heartburn at all.    It can be treated with medication, but also with lifestyle changes including elevation of the head of your bed (ideally with 6 inch  bed blocks),  Smoking cessation, avoidance of late meals, excessive alcohol, and avoid fatty foods, chocolate, peppermint, colas, red wine, and acidic juices such as orange juice.  NO MINT OR MENTHOL PRODUCTS SO NO COUGH DROPS  USE SUGARLESS CANDY INSTEAD (Jolley ranchers or Stover's or Life Savers) or even ice chips will also do - the key is to swallow to prevent all throat clearing. NO OIL BASED VITAMINS - use powdered substitutes.    Keep head of bed up/ pillows or even recliner to keep from aggravating the cough at night  Keep appt with Dr Wilburn Cornelia and tell him about the drainage, especially the color and he can direct you to take more antibiotics  For cough use tylenol #3 up to every 4 hours as needed  Pulmonary follow up is only if you feel the sinuses are doing fine and still coughing but When return bring your medications in 2 separate bags, the ones you take no matter(automatically)  what vs the as needed (only when you feel you need them)

## 2015-10-27 NOTE — Telephone Encounter (Signed)
Pt states the GABAPENTIN 300 Capsules she was put on is giving her terrible side effects and would like to speak with Dr Lorelei Pont or her asst about it. Please call [phone]

## 2015-10-27 NOTE — Telephone Encounter (Signed)
Called her back- she did not answer.  LMOM. I must have failed to document, but she called me about 48 hours ago about this same issue.  I advised her at that time that if she does not like taking the gabapentin (her main complaint was that it made her feel "hot inside" than she can certainly stop taking it.  Advised her to take it just once a day for 2-3 days and then stop.   Today LMOM reminding her of this plan and asked her to call again if she needs anything further.  It looks like she was just seen by pulmonology about an hour ago

## 2015-10-27 NOTE — Progress Notes (Signed)
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 Pulmonary Consult    self referred. Pt c/o of SOB with activity and rapid heart rate. Chest tightness/congestion, wheezing, and prod cough with yellow thick mucus.    acutely ill early May 2016 with scratchy throat/runny nose, loss voice, hacking cough to point of gagging and short of breath. Initially more night than day and now more day > night and does not wake her up. Already rx with multiple abx and steroids s benefit> started on saba and then problems with palpitations and hypokalemia.  Sob mostly when coughing. rec First take delsym two tsp every 12 hours and supplement if needed with  tramadol 50 mg up to 2 every 4 hours to suppress the urge to cough at all or even clear your throat. Swallowing water or using ice chips/non mint and menthol containing candies (such as lifesavers or sugarless jolly ranchers) are also effective.  You should rest your voice and avoid activities that you know make you cough. Once you have eliminated the cough for 3 straight days try reducing the tramadol first,  then the delsym as tolerated.   Prednisone 10 mg take  4 each am x 2 days,   2 each am x 2 days,  1 each am x 2 days and stop (this is to eliminate allergies and inflammation from coughing) Protonix (pantoprazole) Take 30-60 min before first meal of the day and Pepcid 20 mg one bedtime plus chlorpheniramine 4 mg x 2 at bedtime (both available over the counter)  until  cough is completely gone for at least a week without the need for cough suppression GERD diet.    07/14/2015  f/u ov/Megan Lang re: sensation of drainage for 13 years/ cough since May 2016 / not clear she's following recs esp re gerd rx  Chief Complaint  Patient presents with  . Follow-up    Reports cough is better but still coughing up clear-yellow phlem. Slight wheezing, slight SOB.   cough is worse 4pm and no noct cough  / seeping fine  rec Please see patient coordinator before you leave today  to schedule sinus CT  For drainage / throat tickle try take CHLORPHENIRAMINE  4 mg - take one every 4 hours as needed - available over the counter- may cause drowsiness so start with just a bedtime dose or two and see how you tolerate it before trying in daytime   For cough > Take delsym two tsp every 12 hours .  If not better please make appt and bring all meds with you  Late add: Minocycline 100 mg bid x 10 days since pcn allergic      09/03/2015  f/u ov/Megan Lang re: severe uacs from sinus dz > for septoplasty before xmas/ no inhalers/ h1 works the best for sensation of pnds  Chief Complaint  Patient presents with  . Follow-up    GERD. pt. states breathing has improved. occ SOB. denies wheezing, cough  or chest pain/tightness  really  Not limited by breathing from desired activities   rec F/u here only if having a flare of breathing difficulty/ cough or wheeze not obviously related to sinus dz   10/02/15 Sinus surgery   10/10/15 ER eval for sob and cp > neg except TA > [facility] rec bp control and f/u one year    10/27/2015 acute extended ov/Megan Lang re: flare of cough/ wheeze/ sob esp at hs onset early dec 2016 no better p sinus surgery 10/02/15   Chief Complaint  Patient presents with  . Acute Visit    Pt c/o increased wheezing and SOB for the past month. She has had minimal cough-prod with thick, yellow-clear sputum.    symptoms much worse at hs assoc with sense of excess/ purulent pnds  > sob/  wheeze but no on any asthma rx at all    No obvious day to day or daytime variability or assoc   cp or   overt   hb symptoms. No unusual exp hx or h/o childhood pna/ asthma or knowledge of premature birth.  Sleeping ok without nocturnal  or early am exacerbation  of respiratory  c/o's or need for noct saba. Also denies any obvious fluctuation of symptoms with weather or environmental changes or other aggravating or alleviating factors except as outlined above   Current Medications, Allergies, Complete Past Medical History, Past Surgical History, Family History, and Social History were reviewed in [employer] record.  Megan  The following are not active complaints unless bolded sore throat, dysphagia, dental problems, itching, sneezing,  nasal congestion or excess/ purulent secretions, ear ache,   fever, chills, sweats, unintended wt loss, classically pleuritic or exertional cp, hemoptysis,  orthopnea pnd or leg swelling, presyncope, palpitations, abdominal pain, anorexia, nausea, vomiting, diarrhea  or change in bowel or bladder habits, change in stools or urine, dysuria,hematuria,  rash, arthralgias, visual complaints, headache, numbness, weakness or ataxia or problems with walking or coordination,  change in mood/affect or memory.              Objective:   Physical Exam  amb bf  nad slt hoarse but no excess throat clearing   07/14/2015        168  > 09/03/2015    160 > 10/27/2015 161     06/30/15 175 lb (79.379 kg)  06/16/15 178 lb (80.74 kg)  06/12/15 178 lb (80.74 kg)    Vital signs reviewed    HEENT: nl dentition,  and orophanx. Nl external ear canals without cough reflex - mod   non-specific turbinate edema L>R    NECK :  without JVD/Nodes/TM/ nl carotid upstrokes bilaterally   LUNGS: no acc muscle use, clear to A and P bilaterally without cough on insp or exp maneuvers   CV:  RRR  no s3 or murmur or increase in P2, no edema   ABD:  soft and nontender  with nl excursion in the supine position. No bruits or organomegaly, bowel sounds nl  MS:  warm without deformities, calf tenderness, cyanosis or clubbing  SKIN: warm and dry without lesions    NEURO:  alert, approp, no deficits    I personally reviewed images and agree with radiology impression as follows:  CTa chest 10/10/15 > neg pe/ lung dz/ Pos TA x 4.1 cm > f/u T surgery            Assessment & Plan:

## 2015-10-27 NOTE — Progress Notes (Signed)
Cardiology Office Note   Date:  10/28/2015   ID:  Megan Lang, DOB 07-01-1971, MRN [id number]  PCP:  Lamar Blinks, MD  Cardiologist:   Sharol Harness, MD   Chief Complaint  Patient presents with  . New Patient (Initial Visit)    has some chest discomfort, occassional shortness of breath, no edema, no pain or cramping in legs, occassional lightheaded and dizziness     History of Present Illness: Megan Lang is a 45 y.o. female with hypertension who presents for follow up on chest pain and shortness of breath.  Megan Lang has been noticing shortness of breath for approximately one month.  It occurs with exertion such as cleaning.  She also has shortness of breath when laying flat.  She also reports 3/10 substernal chest pain that does not radiate.  Each episode lasts for 15-20 seconds and improves with rest.  The episdoes are associated with nausea without emesis.  In September she had an episodes of severe abdominal pain that was associated with nausea and diaphoresis.  She was evaluated in the ED without finding a cause.  Megan Lang was seen in the emergency department on 12/23 complaint of chest pain.  She also reported shortness of breath and a productive cough.  Cardiac enzymes were negative. She was mildly hypokalemic with a potassium of 3.4. She had a chest CT that was negative for pulmonary embolism.  Mild enlargement of the ascending horacic aorta was noted (4.2 x 4.1 cm).There was also an incidental noting of a pulmonary nodule.  BNP was 10. She was referred to cardiology for further evaluation.  She saw a Pulmonologist, Dr. Christinia Lang, yesterday.  No pulmonary cause of her shortness of breath was identified.  Megan Lang does not get much formal exercise.  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  She feels as though she has been slowing down at work to compensate.   rior to August she was walking and jogging on the treadmill regularly. She is unsure of why she no longer exercises as much,but she does note that she had a sinus surgery in the interim that makes it difficult for her to breathe when exercising.  Megan Lang denies lower extremity edema but does endorse orthopnea. She denies PND.  She has noted occasional palpitations that last for approximately 5 seconds at a time and are associated with shortness of breath but no chest pain or dizziness. This happens approximately twice per day.  It was occurring more frequently when she was taking antibiotics.   Past Medical History  Diagnosis Date  . Normal spontaneous vaginal delivery     3  . Hypertension   . Asthma   . Allergy   . Cardiomegaly   . Carpal tunnel syndrome     Past Surgical History  Procedure Laterality Date  . Carpal tunnel release  2009  . Carpal tunnel release Left      Current Outpatient Prescriptions  Medication Sig Dispense Refill  . acetaminophen-codeine (TYLENOL #3) 300-30 MG tablet Take 1 tablet by mouth every 4 (four) hours as needed for moderate pain.    . bisoprolol-hydrochlorothiazide (ZIAC) 2.5-6.25 MG tablet Take 1 tablet by mouth daily.    . carboxymethylcellulose (REFRESH PLUS) 0.5 % SOLN Place 1 drop into both eyes 3 (three) times daily as needed (for eye irritation).    Marland Kitchen lidocaine (LIDODERM) 5 %  Apply up to 3 patches to most painful areas.  May cut to size.  Wear no more than 12 hours a day 30 patch 1  . medroxyPROGESTERone (DEPO-PROVERA) 150 MG/ML injection INJECT INTRAMUSCULARLY AS DIRECTED 1 mL 2  . pantoprazole (PROTONIX) 40 MG tablet Take 1 tablet (40 mg total) by mouth 2 (two) times daily. 60 tablet 0  . sucralfate (CARAFATE) 1 GM/10ML suspension Take 10 mLs (1 g total) by mouth 4 (four) times daily -  with meals and at bedtime. 420 mL 0   No current facility-administered medications for this visit.    Allergies:   Minocin and Penicillins    Social  History:  The patient  reports that she has never smoked. She has never used smokeless tobacco. She reports that she drinks alcohol. She reports that she does not use illicit drugs.   Family History:  The patient's family history includes Breast cancer in her maternal grandmother; Cancer in her father; Hypertension in her brother, mother, and sister; Kidney disease in her father.    ROS:  Please see the history of present illness.   Otherwise, review of systems are positive for none.   All other systems are reviewed and negative.    PHYSICAL EXAM: VS:  BP 124/84 mmHg  Pulse 87  Ht 5\' 2"  (1.575 m)  Wt 72.179 kg (159 lb 2 oz)  BMI 29.10 kg/m2 , BMI Body mass index is 29.1 kg/(m^2). GENERAL:  Well appearing HEENT:  Pupils equal round and reactive, fundi not visualized, oral mucosa unremarkable NECK:  No jugular venous distention, waveform within normal limits, carotid upstroke brisk and symmetric, no bruits, no thyromegaly LYMPHATICS:  No cervical adenopathy LUNGS:  Clear to auscultation bilaterally HEART:  RRR.  PMI not displaced or sustained,S1 and S2 within normal limits, no S3, no S4, no clicks, no rubs, no murmurs ABD:  Flat, positive bowel sounds normal in frequency in pitch, no bruits, no rebound, no guarding, no midline pulsatile mass, no hepatomegaly, no splenomegaly EXT:  2 plus pulses throughout, no edema, no cyanosis no clubbing SKIN:  No rashes no nodules NEURO:  Cranial nerves II through XII grossly intact, motor grossly intact throughout PSYCH:  Cognitively intact, oriented to person place and time    EKG:  EKG is ordered today. The ekg ordered today demonstrates inus rhythm Rate 87 bpm. Nonspecific T wave abnormalities.   Recent Labs: 11/14/2014: TSH 0.696 10/10/2015: B Natriuretic Peptide 10.3 10/18/2015: ALT 41; BUN 15; Creatinine, Ser 0.75; Hemoglobin 12.7; Platelets 313; Potassium 3.6; Sodium 139    Lipid Panel    Component Value Date/Time   CHOL 140  11/14/2014 1020   TRIG 69 11/14/2014 1020   HDL 49 11/14/2014 1020   CHOLHDL 2.9 11/14/2014 1020   VLDL 14 11/14/2014 1020   LDLCALC 77 11/14/2014 1020      Wt Readings from Last 3 Encounters:  10/28/15 72.179 kg (159 lb 2 oz)  10/27/15 73.12 kg (161 lb 3.2 oz)  10/22/15 72.576 kg (160 lb)      ASSESSMENT AND PLAN:  # Chest pain:  Megan Lang exertional chest pain is concerning for ischemia.  We will obtain an exercise Cardiolite to both evaluate for ischemia as well as check for exercise-induced arrhythmias.    # Shortness of breath: It is unclear what is causing Megan Lang shortness of breath. She was evaluated by by pulmonologist yesterday who did not find any cause of her symptoms.  Here is no evidence of volume overload  on exam, though she does endorse orthopnea. Her BNP was within normal limits when checked in the emergency department. We will obtain an echocardiogram to evaluate for structural heart disease as a cause of her symptoms.  # Palpitations: Megan Lang reports a history of palpitations.  She has had hypokalemia,which could be contributing to her symptoms. We will check a basic metabolic panel as well as a magnesium level today.We will also check a TSH. If these are unremarkable and no arrhythmias are noted on her exercise stress test as above, we will consider ambulatory monitoring.  # Hypertension: Blood pressure well-controlled to Continue bisoprolol and hydrochlorothiazide. Check basic metabolic panel as above.  # CV Disease Prevention: ASCVD 10 year risk is 1.6% based on her lipids from last year.  We will not repeat them at this time.Megan Lang was encouraged to increase her exercise to 30-40 minutes most days of the week if the above testing is normal.  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 No intervention is required at this time.  Blood pressure is well-controlled. She is being followed by Dr.  Prescott Gum and will have a repeat CT scan in 1 year.  Current medicines are reviewed at length with the patient today.  The patient does not have concerns regarding medicines.  The following changes have been made:  no change  Labs/ tests ordered today include:   Orders Placed This Encounter  Procedures  . Basic metabolic panel  . Magnesium  . TSH  . Myocardial Perfusion Imaging  . EKG 12-Lead  . ECHOCARDIOGRAM COMPLETE     Disposition:   FU with Kellene Mccleary C. Oval Linsey, MD, [facility] in 3 months.    This note was written with the assistance of speech recognition software.  Please excuse any transcriptional errors.  Signed, Karnell Vanderloop C. Oval Linsey, MD, [facility]  10/28/2015 8:46 AM    [place]

## 2015-10-28 ENCOUNTER — Encounter: Payer: Self-pay | Admitting: Cardiovascular Disease

## 2015-10-28 ENCOUNTER — Ambulatory Visit (INDEPENDENT_AMBULATORY_CARE_PROVIDER_SITE_OTHER): Payer: BC Managed Care – PPO | Admitting: Cardiovascular Disease

## 2015-10-28 VITALS — BP 124/84 | HR 87 | Ht 62.0 in | Wt 159.1 lb

## 2015-10-28 DIAGNOSIS — R002 Palpitations: Secondary | ICD-10-CM

## 2015-10-28 DIAGNOSIS — R079 Chest pain, unspecified: Secondary | ICD-10-CM | POA: Diagnosis not present

## 2015-10-28 DIAGNOSIS — R0602 Shortness of breath: Secondary | ICD-10-CM | POA: Diagnosis not present

## 2015-10-28 DIAGNOSIS — I10 Essential (primary) hypertension: Secondary | ICD-10-CM

## 2015-10-28 LAB — BASIC METABOLIC PANEL
BUN: 8 mg/dL (ref 7–25)
CALCIUM: 9.5 mg/dL (ref 8.6–10.2)
CO2: 25 mmol/L (ref 20–31)
Chloride: 105 mmol/L (ref 98–110)
Creat: 0.74 mg/dL (ref 0.50–1.10)
Glucose, Bld: 82 mg/dL (ref 65–99)
POTASSIUM: 3.6 mmol/L (ref 3.5–5.3)
SODIUM: 139 mmol/L (ref 135–146)

## 2015-10-28 LAB — MAGNESIUM: MAGNESIUM: 2.1 mg/dL (ref 1.5–2.5)

## 2015-10-28 LAB — TSH: TSH: 0.574 u[IU]/mL (ref 0.350–4.500)

## 2015-10-28 NOTE — Patient Instructions (Signed)
Medication Instructions:  Please continue your current medications  Labwork: Your physician recommends that you return for lab work TODAY.  Testing/Procedures: 1. Echocardiogram - Your physician has requested that you have an echocardiogram. Echocardiography is a painless test that uses sound waves to create images of your heart. It provides your doctor with information about the size and shape of your heart and how well your heart's chambers and valves are working. This procedure takes approximately one hour. There are no restrictions for this procedure.  2. Exercise Cardiolite Stress Test - Your physician has requested that you have en exercise stress myoview. For further information please visit [email or web address]. Please follow instruction sheet, as given.  Follow-Up: Dr Oval Linsey recommends that you schedule a follow-up appointment in 3 months.  If you need a refill on your cardiac medications before your next appointment, please call your pharmacy.

## 2015-10-29 ENCOUNTER — Telehealth: Payer: Self-pay | Admitting: *Deleted

## 2015-10-29 ENCOUNTER — Telehealth (HOSPITAL_COMMUNITY): Payer: Self-pay

## 2015-10-29 DIAGNOSIS — Z01818 Encounter for other preprocedural examination: Secondary | ICD-10-CM

## 2015-10-29 NOTE — Telephone Encounter (Signed)
Encounter complete. 

## 2015-10-29 NOTE — Telephone Encounter (Signed)
Per  Shirlean Mylar ,Supervisor of NUCLEAR STUDIES Patient needs a serum pregnancy test- before myoview.  per Shirlean Mylar, patient is aware and awaiting order  RN ,ordered lab Notified patient to have done tomorrow or Friday at [facility]. Patient verbalized understanding.

## 2015-10-30 ENCOUNTER — Other Ambulatory Visit: Payer: Self-pay

## 2015-10-30 ENCOUNTER — Ambulatory Visit (HOSPITAL_COMMUNITY): Payer: BC Managed Care – PPO | Attending: Cardiovascular Disease

## 2015-10-30 DIAGNOSIS — I517 Cardiomegaly: Secondary | ICD-10-CM | POA: Diagnosis not present

## 2015-10-30 DIAGNOSIS — R0602 Shortness of breath: Secondary | ICD-10-CM | POA: Diagnosis not present

## 2015-10-30 DIAGNOSIS — R079 Chest pain, unspecified: Secondary | ICD-10-CM | POA: Diagnosis not present

## 2015-10-31 ENCOUNTER — Ambulatory Visit (INDEPENDENT_AMBULATORY_CARE_PROVIDER_SITE_OTHER): Payer: BC Managed Care – PPO | Admitting: Gastroenterology

## 2015-10-31 ENCOUNTER — Encounter: Payer: Self-pay | Admitting: Gastroenterology

## 2015-10-31 VITALS — BP 136/98 | HR 76 | Ht 62.0 in | Wt 160.1 lb

## 2015-10-31 DIAGNOSIS — R1013 Epigastric pain: Secondary | ICD-10-CM | POA: Diagnosis not present

## 2015-10-31 DIAGNOSIS — R0789 Other chest pain: Secondary | ICD-10-CM

## 2015-10-31 DIAGNOSIS — K219 Gastro-esophageal reflux disease without esophagitis: Secondary | ICD-10-CM | POA: Diagnosis not present

## 2015-10-31 LAB — HCG, SERUM, QUALITATIVE: PREG SERUM: NEGATIVE

## 2015-10-31 MED ORDER — SUCRALFATE 1 GM/10ML PO SUSP: 1.0000 g | Freq: Three times a day (TID) | ORAL | Status: DC

## 2015-10-31 MED ORDER — OMEPRAZOLE 40 MG PO CPDR: 40.0000 mg | DELAYED_RELEASE_CAPSULE | Freq: Two times a day (BID) | ORAL | Status: DC

## 2015-10-31 NOTE — Patient Instructions (Addendum)
You have been scheduled for an endoscopy. Please follow written instructions given to you at your visit today. If you use inhalers (even only as needed), please bring them with you on the day of your procedure. Your physician has requested that you go to www.startemmi.com and enter the access code given to you at your visit today. This web site gives a general overview about your procedure. However, you should still follow specific instructions given to you by our office regarding your preparation for the procedure.   We have sent  medications to your pharmacy for you to pick up at your convenience.  Discontinue Protonix.

## 2015-10-31 NOTE — Progress Notes (Signed)
HPI :  45 y/o female here for follow up. She was previously seen for complaints of dysphagia which were thought to be due to tonsillitis. She had completed course of antibiotics and her oropharyngeal dysphagia had resolved.   She is now here for evaluation for new symptoms that have started since I have last seen her. She has been having intermittent chest pain, dyspnea, and palpitations. She has also had a burning discomfort in her abdomen and chest, it's been going on since early December. She reports her stomach and chest keeps her up at night, she feels a burning sensation in her epigastric to LUQ area, and it also in her lower chest. She has been to the ER 4 times for this discomfort in recent motnsh. She reports it comes and goes. sometimes made worse with exertion, sometimes at rest, it occurs sporadically. She reported it was initially constant, but now it comes and goes. She feels it dailyy, multiple times per day, lasts 15-20 minutes at a time and then resolves. She thinks certain foods can make it worse. She has some nausea, but not vomiting. She denies dysphagia but thinks she has the sensation to clear her throat a lot. When I last saw her she had symptoms of dysphagia thought to be due to her tonsils, followed by ENT. She was treated with antibiotics and these symptoms have resolved. She has mild odynophagia which has been ongoing for a while, usually only if she takes a very large bite. She thinks she may have lost about 10 lbs in the past few months. She had sinus surgery. She has been on multiple courses of antibiotics for her tonsils and her sinus issues. She is not sure if this made her abdominal discomfort worse. She has since stopped all of her antibiotics. She is taking protonix 40mg  twice daily. She is taking it 30-40 minutes before meals. She is taking carafate PRN which she thinks helps somewhat, more so than anything else.  She has been trying to avoid NSAIDs.   She had a CT angio  of the chest which showed a mild aortic aneurysm for which she has seen surgery. She also had pulmonary nodule. CT abdomen was normal in October 2016.  She has a nuclear stress test scheduled for next week, she had an echocardiogram recently, done per cardiology. Echocardiogram was normal.     Past Medical History  Diagnosis Date  . Normal spontaneous vaginal delivery     3  . Hypertension   . Asthma   . Allergy   . Cardiomegaly   . Carpal tunnel syndrome   . Aortic aneurysm [facility])      Past Surgical History  Procedure Laterality Date  . Carpal tunnel release  2009  . Carpal tunnel release Left    Family History  Problem Relation Age of Onset  . Hypertension Mother   . Kidney disease Father   . Breast cancer Maternal Grandmother   . Hypertension Sister   . Hypertension Brother   . Liver disease Father     Unknown diagnosis   Social History  Substance Use Topics  . Smoking status: Never Smoker   . Smokeless tobacco: Never Used  . Alcohol Use: 0.0 oz/week    0 Standard drinks or equivalent per week     Comment: OCC- WINE   Current Outpatient Prescriptions  Medication Sig Dispense Refill  . acetaminophen-codeine (TYLENOL #3) 300-30 MG tablet Take 1 tablet by mouth every 4 (four) hours as needed  for moderate pain.    . bisoprolol-hydrochlorothiazide (ZIAC) 2.5-6.25 MG tablet Take 1 tablet by mouth daily.    . carboxymethylcellulose (REFRESH PLUS) 0.5 % SOLN Place 1 drop into both eyes 3 (three) times daily as needed (for eye irritation).    Marland Kitchen lidocaine (LIDODERM) 5 % Apply up to 3 patches to most painful areas.  May cut to size.  Wear no more than 12 hours a day 30 patch 1  . medroxyPROGESTERone (DEPO-PROVERA) 150 MG/ML injection INJECT INTRAMUSCULARLY AS DIRECTED 1 mL 2  . omeprazole (PRILOSEC) 40 MG capsule Take 1 capsule (40 mg total) by mouth 2 (two) times daily. 60 capsule 4  . sucralfate (CARAFATE) 1 GM/10ML suspension Take 10 mLs (1 g total) by mouth 4 (four) times  daily -  with meals and at bedtime. 420 mL 1   No current facility-administered medications for this visit.   Allergies  Allergen Reactions  . Minocin [Minocycline Hcl] Other (See Comments)    Stomach Pains  . Penicillins Rash    Has patient had a PCN reaction causing immediate rash, facial/tongue/throat swelling, SOB or lightheadedness with hypotension:yes Has patient had a PCN reaction causing severe rash involving mucus membranes or skin necrosis:no Has patient had a PCN reaction that required hospitalization:no Has patient had a PCN reaction occurring within the last 10 years:No If all of the above answers are "NO", then may proceed with Cephalosporin     Review of Systems: All systems reviewed and negative except where noted in HPI.   Lab Results  Component Value Date   WBC 5.9 10/18/2015   HGB 12.7 10/18/2015   HCT 38.2 10/18/2015   MCV 88.4 10/18/2015   PLT 313 10/18/2015    Lab Results  Component Value Date   ALT 41 10/18/2015   AST 20 10/18/2015   ALKPHOS 65 10/18/2015   BILITOT 0.4 10/18/2015    Lab Results  Component Value Date   CREATININE 0.74 10/28/2015   BUN 8 10/28/2015   NA 139 10/28/2015   K 3.6 10/28/2015   CL 105 10/28/2015   CO2 25 10/28/2015    Lab Results  Component Value Date   LIPASE 39 10/18/2015      Dg Chest 2 View  10/09/2015  CLINICAL DATA:  Mid to left-sided chest pain. Burning sensation worsening over the last several days. EXAM: CHEST  2 VIEW COMPARISON:  10/03/2015 FINDINGS: The cardiomediastinal silhouette is within normal limits. The lungs are well inflated and clear. There is no evidence of pleural effusion or pneumothorax. Minimal thoracic dextroscoliosis is unchanged. IMPRESSION: No active cardiopulmonary disease. Electronically Signed   By: Logan Bores M.D.   On: 10/09/2015 07:13   Dg Chest 2 View  10/03/2015  CLINICAL DATA:  Chest pain and shortness of Breath EXAM: CHEST - 2 VIEW COMPARISON:  07/17/2015 FINDINGS:  The heart size and mediastinal contours are within normal limits. Both lungs are clear. The visualized skeletal structures are unremarkable. IMPRESSION: No active disease. Electronically Signed   By: Inez Catalina M.D.   On: 10/03/2015 19:25   Ct Angio Chest Pe W/cm &/or Wo Cm  10/10/2015  CLINICAL DATA:  Shortness of breath and tachycardia EXAM: CT ANGIOGRAPHY CHEST WITH CONTRAST TECHNIQUE: Multidetector CT imaging of the chest was performed using the standard protocol during bolus administration of intravenous contrast. Multiplanar CT image reconstructions and MIPs were obtained to evaluate the vascular anatomy. CONTRAST:  164mL OMNIPAQUE IOHEXOL 350 MG/ML SOLN COMPARISON:  Chest radiograph October 09, 2015 FINDINGS:  There is no demonstrable pulmonary embolus. There is prominence of the ascending thoracic aorta with a maximum transverse diameter of 4.2 x 4.1 cm. There is no thoracic aortic dissection. The visualized great vessels appear unremarkable. Note that the left and right common carotid arteries arise as a common trunk, an anatomic variant. There is no edema or consolidation. On axial slice 37 series 7, there is a 3 mm nodular opacity in the anterior segment of the left upper lobe. There appears to be a degree of residual thymic tissue in the anterior mediastinum. There are several nearby lymph nodes immediately adjacent to the aortic arch on the left. There are several mildly prominent lymph nodes in each axillary region. The largest individual lymph node is in the right axilla measuring 1.5 x 0.9 cm. Pericardium is not thickened. There are scattered foci of coronary artery calcification. In the visualized upper abdomen, there are several small liver cysts, largest measuring 1.3 x 1.3 cm. Visualized upper abdominal structures otherwise appear unremarkable. There are no blastic or lytic bone lesions. Review of the MIP images confirms the above findings. IMPRESSION: No demonstrable pulmonary embolus.  Prominence of the ascending thoracic aorta with a maximum transverse diameter 4.2 x 4.1 cm. Recommend annual imaging followup by CTA or MRA. This recommendation follows 2010 ACCF/AHA/AATS/ACR/ASA/SCA/SCAI/SIR/STS/SVM Guidelines for the Diagnosis and Management of Patients with Thoracic Aortic Disease. Circulation. 2010; 121: e266-e369 3 mm nodular opacity left upper lobe. Followup of this nodular opacity should be based on Fleischner Society guidelines. If the patient is at high risk for bronchogenic carcinoma, follow-up chest CT at 1year is recommended. If the patient is at low risk, no follow-up is needed. This recommendation follows the consensus statement: Guidelines for Management of Small Pulmonary Nodules Detected on CT Scans: A Statement from the [employer] as published in Radiology 2005; 237:395-400. 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 There are several small lymph nodes in the anterior mediastinum near the thymus as well as in each axillary region. Significance of these lymph nodes is uncertain. Scattered foci of coronary artery calcification apparent. Electronically Signed   By: Lowella Grip III M.D.   On: 10/10/2015 09:28   Dg Abd Acute W/chest  10/18/2015  CLINICAL DATA:  Acute onset of generalized abdominal pain and constipation. Initial encounter. EXAM: DG ABDOMEN ACUTE W/ 1V CHEST COMPARISON:  CTA of the chest performed 10/10/2015, and CT of the abdomen and pelvis from 07/24/2015 FINDINGS: The lungs are well-aerated and clear. There is no evidence of focal opacification, pleural effusion or pneumothorax. The cardiomediastinal silhouette is borderline normal in size. The visualized bowel gas pattern is unremarkable. Scattered stool and air are seen within the colon; there is no evidence of small bowel dilatation to suggest obstruction. No free intra-abdominal air is identified on the provided upright view. No acute osseous abnormalities are seen; the  sacroiliac joints are unremarkable in appearance. IMPRESSION: 1. Unremarkable bowel gas pattern; no free intra-abdominal air seen. Moderate amount of stool noted in the colon. 2. No acute cardiopulmonary process seen. Electronically Signed   By: Garald Balding M.D.   On: 10/18/2015 07:04    Physical Exam: BP 136/98 mmHg  Pulse 76  Ht 5\' 2"  (1.575 m)  Wt 160 lb 2 oz (72.632 kg)  BMI 29.28 kg/m2 Constitutional: Pleasant,well-developed, female in no acute distress. HEENT: Normocephalic and atraumatic. Conjunctivae are normal. No scleral icterus. Neck supple.  Cardiovascular: Normal rate, regular rhythm.  Pulmonary/chest: Effort normal and breath sounds normal. No wheezing, rales  or rhonchi. Abdominal: Soft, nondistended, epigastric to LUQ tenderness to palpation, (+) Carnett sign. Bowel sounds active throughout. There are no masses palpable. No hepatomegaly. Extremities: no edema Lymphadenopathy: No cervical adenopathy noted. Neurological: Alert and oriented to person place and time. Skin: Skin is warm and dry. No rashes noted. Psychiatric: Normal mood and affect. Behavior is normal.   ASSESSMENT AND PLAN: 45 y/o female here for follow up. History as above. Previously seen for dysphagia which was thought to be oropharyngeal and due to tonsillitis, this has since resolved. Now here for intermittent chest / abdominal pains. She is in the middle of a cardiac workup for these symptoms. Echocardiogram normal, awaiting stress test next week but suspect this will be negative given the symptoms she endorses. Symptoms seem to be reproduced by eating certain foods, and improved by use of Carafate PRN. Reflux certainly could be driving these symptoms although she has not endorsed much benefit from PPI at this time. Further, she could have a component of costochondritis / abdominal wall pain as she is tender along the sternum/costal margin with a positive Carnett sign. She has already been given a lidocaine  patch for this.   At this time, will await stress test per cardiology which is scheduled for early next week. If this is negative, I discussed EGD with her and she would like to proceed with it. In the interim, given lack of response to protonix, will switch her to omeprazole to see if this provides any further relief. She can continue Carafate PRN every 6 hours as this does seem to help. Pending EGD results and her course, we may also consider 24 HR pH impedance testing to clarify if she has nonacid reflux, hypersensitive esophagus, or functional heartburn given her lack of response to PPI thus far.   Otherwise, she is 45 y/o and warrants a colonoscopy next May at age 49 for screening purposes. She clarified that she has no family history of colon cancer.   The indications, risks, and benefits of EGD were explained to the patient in detail. Risks include but are not limited to bleeding, perforation, adverse reaction to medications, and cardiopulmonary compromise. Sequelae include but are not limited to the possibility of surgery, hospitalization, and mortality. The patient verbalized understanding and wished to proceed. All questions answered, referred to scheduler. Further recommendations pending results of the exam.   Bailey Cellar, MD [facility] Pager [phone]

## 2015-11-03 ENCOUNTER — Telehealth: Payer: Self-pay | Admitting: Gastroenterology

## 2015-11-03 ENCOUNTER — Other Ambulatory Visit: Payer: Self-pay

## 2015-11-03 DIAGNOSIS — R079 Chest pain, unspecified: Secondary | ICD-10-CM

## 2015-11-03 DIAGNOSIS — K219 Gastro-esophageal reflux disease without esophagitis: Secondary | ICD-10-CM

## 2015-11-03 NOTE — Telephone Encounter (Signed)
Spoke with patient again. She will try [employer] for gas. She understands to keep her EGD appointment on 11/07/15 to help [place] figure out her "burning sensation at night."

## 2015-11-03 NOTE — Telephone Encounter (Signed)
Spoke with patient and she will try [employer] for gas. She will keep her EGD appointment.

## 2015-11-04 ENCOUNTER — Ambulatory Visit (HOSPITAL_COMMUNITY)
Admission: RE | Admit: 2015-11-04 | Discharge: 2015-11-04 | Disposition: A | Discharge status: Home / Self Care | Payer: BC Managed Care – PPO | Source: Ambulatory Visit | Attending: Cardiovascular Disease | Admitting: Cardiovascular Disease

## 2015-11-04 ENCOUNTER — Ambulatory Visit: Payer: BC Managed Care – PPO | Admitting: Gastroenterology

## 2015-11-04 DIAGNOSIS — R0602 Shortness of breath: Secondary | ICD-10-CM

## 2015-11-04 DIAGNOSIS — I10 Essential (primary) hypertension: Secondary | ICD-10-CM | POA: Diagnosis not present

## 2015-11-04 DIAGNOSIS — R5383 Other fatigue: Secondary | ICD-10-CM | POA: Diagnosis not present

## 2015-11-04 DIAGNOSIS — R079 Chest pain, unspecified: Secondary | ICD-10-CM | POA: Diagnosis not present

## 2015-11-04 DIAGNOSIS — R002 Palpitations: Secondary | ICD-10-CM | POA: Diagnosis not present

## 2015-11-04 LAB — MYOCARDIAL PERFUSION IMAGING
Estimated workload: 11.7 METS
Exercise duration (min): 11 min
Exercise duration (sec): 2 s
LV dias vol: 94 mL
LV sys vol: 37 mL
MPHR: 176 {beats}/min
Peak HR: 157 {beats}/min
Percent HR: 89 %
RPE: 17
Rest HR: 67 {beats}/min
SDS: 2
SRS: 0
SSS: 2
TID: 0.98

## 2015-11-04 MED ORDER — TECHNETIUM TC 99M SESTAMIBI GENERIC - CARDIOLITE
10.3000 | Freq: Once | INTRAVENOUS | Status: AC | PRN
  Administered 2015-11-04: 10 via INTRAVENOUS

## 2015-11-04 MED ORDER — TECHNETIUM TC 99M SESTAMIBI GENERIC - CARDIOLITE
30.9000 | Freq: Once | INTRAVENOUS | Status: AC | PRN
  Administered 2015-11-04: 30.9 via INTRAVENOUS

## 2015-11-05 ENCOUNTER — Telehealth: Payer: Self-pay | Admitting: *Deleted

## 2015-11-05 NOTE — Telephone Encounter (Signed)
Spoke to patient.  STRESS TEST ,ECHO ,LAB Result given . Verbalized understanding

## 2015-11-05 NOTE — Telephone Encounter (Signed)
-----   Message from Skeet Latch, MD sent at 11/04/2015  5:13 PM EST ----- Normal stress test.

## 2015-11-05 NOTE — Telephone Encounter (Signed)
-----   Message from Skeet Latch, MD sent at 11/04/2015 11:26 AM EST ----- Normal thyroid function, kidney function, and electrolytes

## 2015-11-05 NOTE — Telephone Encounter (Signed)
-----   Message from Skeet Latch, MD sent at 11/04/2015 11:26 AM EST ----- She is not pregnant.

## 2015-11-05 NOTE — Telephone Encounter (Signed)
-----   Message from Skeet Latch, MD sent at 11/04/2015 11:26 AM EST ----- Normal echo

## 2015-11-05 NOTE — Telephone Encounter (Signed)
Spoke to patient.  LABS- KIDNEY, THYROID ,ELECTROLYTES Result given . Verbalized understanding

## 2015-11-06 ENCOUNTER — Ambulatory Visit: Payer: BC Managed Care – PPO | Admitting: Gastroenterology

## 2015-11-07 ENCOUNTER — Encounter: Payer: Self-pay | Admitting: Family Medicine

## 2015-11-07 ENCOUNTER — Ambulatory Visit (AMBULATORY_SURGERY_CENTER): Payer: BC Managed Care – PPO | Admitting: Gastroenterology

## 2015-11-07 ENCOUNTER — Encounter: Payer: Self-pay | Admitting: Gastroenterology

## 2015-11-07 VITALS — BP 151/96 | HR 86 | Temp 98.2°F | Resp 18 | Ht 62.0 in | Wt 160.0 lb

## 2015-11-07 DIAGNOSIS — R1013 Epigastric pain: Secondary | ICD-10-CM | POA: Diagnosis not present

## 2015-11-07 DIAGNOSIS — K219 Gastro-esophageal reflux disease without esophagitis: Secondary | ICD-10-CM

## 2015-11-07 DIAGNOSIS — K317 Polyp of stomach and duodenum: Secondary | ICD-10-CM

## 2015-11-07 MED ORDER — SODIUM CHLORIDE 0.9 % IV SOLN: 500.0000 mL | INTRAVENOUS | Status: DC

## 2015-11-07 NOTE — Op Note (Signed)
[place]  [address] [place], [id number]   ENDOSCOPY PROCEDURE REPORT  PATIENT: Megan Lang, Megan Lang  MR#: [id number] BIRTHDATE: 1971/09/01 , 36  yrs. old GENDER: female ENDOSCOPIST: Yetta Flock, MD REFERRED BY: PROCEDURE DATE:  11/07/2015 PROCEDURE:  EGD w/ biopsy ASA CLASS:     Class II INDICATIONS:  chest pain and epigastric pain despite PPI use. MEDICATIONS: Propofol 200 mg IV and Lidocaine 140 mg IV TOPICAL ANESTHETIC:  DESCRIPTION OF PROCEDURE: After the risks benefits and alternatives of the procedure were thoroughly explained, informed consent was obtained.  The LB [id number] endoscope was introduced through the mouth and advanced to the second portion of the duodenum , Without limitations.  The instrument was slowly withdrawn as the mucosa was fully examined.  FINDINGS: The esophagus was normal in appearance.  DH noted at 39cm from the incisors, with GEJ and SCJ located 38cm from the incisors, with a 1cm hiatal hernia.  The stomach was remarkable for a 4-40mm sessile gastric body polyp, most consistent with a benign fundic gland polyp, removed with cold forceps.  The remainder of the examined stomach was normal.  Biopsies were taken of the antrum and body to rule out H pylori.  The duodenal bulb and second portion of the duodenum were normal.  Retroflexed views revealed no abnormalities.     The scope was then withdrawn from the patient and the procedure completed.  COMPLICATIONS: There were no immediate complications.  ENDOSCOPIC IMPRESSION: Normal esophagus 1cm hiatal hernia Benign appearing gastric polyp, removed with cold forceps Normal remainder of examined stomach, biopsies taken to rule out H pylori Normal duodenum  RECOMMENDATIONS: Await pathology results Resume diet Resume medications Pending pathology results, if chest pain persists, consider 24 HR pH impedance testing OFF PPI to more objectively evaluate  for reflux  eSigned:  Yetta Flock, MD 11/07/2015 2:41 PM   CC: the patient

## 2015-11-07 NOTE — Progress Notes (Signed)
Report to PACU, RN, vss, BBS= Clear.  

## 2015-11-07 NOTE — Progress Notes (Signed)
Discharge instructions given by Riki Sheer, LPN.  Good feedback from patient noted.

## 2015-11-07 NOTE — Patient Instructions (Signed)
YOU HAD AN ENDOSCOPIC PROCEDURE TODAY AT [facility]:   Refer to the procedure report that was given to you for any specific questions about what was found during the examination.  If the procedure report does not answer your questions, please call your gastroenterologist to clarify.  If you requested that your care partner not be given the details of your procedure findings, then the procedure report has been included in a sealed envelope for you to review at your convenience later.  YOU SHOULD EXPECT: Some feelings of bloating in the abdomen. Passage of more gas than usual.  Walking can help get rid of the air that was put into your GI tract during the procedure and reduce the bloating.   Please Note:  You might notice some irritation and congestion in your nose or some drainage.  This is from the oxygen used during your procedure.  There is no need for concern and it should clear up in a day or so.  SYMPTOMS TO REPORT IMMEDIATELY:    Following upper endoscopy (EGD)  Vomiting of blood or coffee ground material  New chest pain or pain under the shoulder blades  Painful or persistently difficult swallowing  New shortness of breath  Fever of 100F or higher  Black, tarry-looking stools  For urgent or emergent issues, a gastroenterologist can be reached at any hour by calling [phone].   DIET: Your first meal following the procedure should be a small meal and then it is ok to progress to your normal diet. Heavy or fried foods are harder to digest and may make you feel nauseous or bloated.  Likewise, meals heavy in dairy and vegetables can increase bloating.  Drink plenty of fluids but you should avoid alcoholic beverages for 24 hours.  ACTIVITY:  You should plan to take it easy for the rest of today and you should NOT DRIVE or use heavy machinery until tomorrow (because of the sedation medicines used during the test).    FOLLOW UP: Our staff will call the number listed  on your records the next business day following your procedure to check on you and address any questions or concerns that you may have regarding the information given to you following your procedure. If we do not reach you, we will leave a message.  However, if you are feeling well and you are not experiencing any problems, there is no need to return our call.  We will assume that you have returned to your regular daily activities without incident.  If any biopsies were taken you will be contacted by phone or by letter within the next 1-3 weeks.  Please call us at [phone] if you have not heard about the biopsies in 3 weeks.    SIGNATURES/CONFIDENTIALITY: You and/or your care partner have signed paperwork which will be entered into your electronic medical record.  These signatures attest to the fact that that the information above on your After Visit Summary has been reviewed and is understood.  Full responsibility of the confidentiality of this discharge information lies with you and/or your care-partner.  Take your medications as directed. Read all of the handouts given to you by your recovery room nurse.   Thank-you for choosing [place] for your healthcare needs.

## 2015-11-07 NOTE — Progress Notes (Signed)
Called to room to assist during endoscopic procedure.  Patient ID and intended procedure confirmed with present staff. Received instructions for my participation in the procedure from the performing physician.  

## 2015-11-08 ENCOUNTER — Other Ambulatory Visit: Payer: Self-pay | Admitting: Gynecology

## 2015-11-10 ENCOUNTER — Telehealth: Payer: Self-pay | Admitting: *Deleted

## 2015-11-10 NOTE — Telephone Encounter (Signed)
Unable to connect to answering machine, no answer.

## 2015-11-12 ENCOUNTER — Encounter: Payer: Self-pay | Admitting: Family Medicine

## 2015-11-12 ENCOUNTER — Encounter: Payer: Self-pay | Admitting: Cardiovascular Disease

## 2015-11-13 ENCOUNTER — Telehealth: Payer: Self-pay | Admitting: Gastroenterology

## 2015-11-13 NOTE — Telephone Encounter (Signed)
Left a message for patient to call back. 

## 2015-11-13 NOTE — Telephone Encounter (Signed)
Patient notified that results have not been reviewed yet.

## 2015-11-17 ENCOUNTER — Telehealth: Payer: Self-pay | Admitting: Gastroenterology

## 2015-11-17 ENCOUNTER — Encounter: Payer: BC Managed Care – PPO | Admitting: Gynecology

## 2015-11-17 NOTE — Telephone Encounter (Signed)
Spoke with patient and she is still experiencing lots of gas and bloating. Mailed out information on gas prevention diet.

## 2015-11-19 ENCOUNTER — Encounter: Payer: BC Managed Care – PPO | Admitting: Gynecology

## 2015-11-21 ENCOUNTER — Encounter: Payer: BC Managed Care – PPO | Admitting: Gynecology

## 2015-11-27 ENCOUNTER — Encounter: Payer: Self-pay | Admitting: Gynecology

## 2015-11-27 ENCOUNTER — Ambulatory Visit (INDEPENDENT_AMBULATORY_CARE_PROVIDER_SITE_OTHER): Payer: BC Managed Care – PPO | Admitting: Gynecology

## 2015-11-27 VITALS — BP 130/82 | Ht 62.5 in | Wt 158.6 lb

## 2015-11-27 DIAGNOSIS — Z3049 Encounter for surveillance of other contraceptives: Secondary | ICD-10-CM | POA: Diagnosis not present

## 2015-11-27 DIAGNOSIS — Z01419 Encounter for gynecological examination (general) (routine) without abnormal findings: Secondary | ICD-10-CM

## 2015-11-27 DIAGNOSIS — Z3042 Encounter for surveillance of injectable contraceptive: Secondary | ICD-10-CM

## 2015-11-27 MED ORDER — MEDROXYPROGESTERONE ACETATE 150 MG/ML IM SUSP
150.0000 mg | Freq: Once | INTRAMUSCULAR | Status: AC
  Administered 2015-11-27: 150 mg via INTRAMUSCULAR

## 2015-11-27 NOTE — Progress Notes (Signed)
Megan Lang 10/16/1971 [id number]   History:    45 y.o.  for annual gyn exam who has been through a lot this past year. Her mother died and a close friend as well. Patient has been evaluated by the gastroenterologist as a result of gastroesophageal reflux and had endoscopy last month. She scheduled for colonoscopy in May of this year. Patient also had been followed by the cardiothoracic surgeon Dr. Tharon Aquas Trist as a result of a mild fusiform ascending aneurysm that had been noted on CT scan which had been ordered in the ED. 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 Risk of dissection currently is less than 1% per year  Treatment recommendations--blood pressure control is the most important aspect of this patient's risk profile for dissection. This was discussed with the patient she was recommended to check her blood pressure frequently and to keep her blood pressure less than 150/90.  Serial surveillance scans of her ascending aorta are recommended and she will be scheduled for a CT scan with contrast in one year. Symptoms of acute dissection including severe tearing anterior precordial or posterior interscapular pain should be immediately evaluated at closest emergency room.with repeat CT scan."  A repeat CT scan for surveillance was recommended 1 year from previous study which her PCP we'll be following. Patient is on Depo-Provera 150 mg every 3 months for contraception. She was reminded the importance of taking calcium vitamin D supplement daily. Patient with no previous history of any abnormal Pap smear. She is due for mammogram next month.  Patient's mother had died of some form of GI malignancy and this is why gastroenterologist is planning on a colonoscopy in the next few months.  Past medical history,surgical history, family history and social history were all reviewed and documented  in the EPIC chart.  Gynecologic History No LMP recorded. Patient has had an injection. Contraception: Depo-Provera injections Last Pap: 2012, 2013 2016 . Results were: normal Last mammogram: 2016. Results were: normal  Obstetric History OB History  Gravida Para Term Preterm AB SAB TAB Ectopic Multiple Living  3 3 3       3     # Outcome Date GA Lbr Len/2nd Weight Sex Delivery Anes PTL Lv  3 Term     F Vag-Spont  N Y  2 Term     F Vag-Spont  N Y  1 Term     F Vag-Spont  N Y       ROS: A ROS was performed and pertinent positives and negatives are included in the history.  GENERAL: No fevers or chills. HEENT: No change in vision, no earache, sore throat or sinus congestion. NECK: No pain or stiffness. CARDIOVASCULAR: No chest pain or pressure. No palpitations. PULMONARY: No shortness of breath, cough or wheeze. GASTROINTESTINAL: No abdominal pain, nausea, vomiting or diarrhea, melena or bright red blood per rectum. GENITOURINARY: No urinary frequency, urgency, hesitancy or dysuria. MUSCULOSKELETAL: No joint or muscle pain, no back pain, no recent trauma. DERMATOLOGIC: No rash, no itching, no lesions. ENDOCRINE: No polyuria, polydipsia, no heat or cold intolerance. No recent change in weight. HEMATOLOGICAL: No anemia or easy bruising or bleeding. NEUROLOGIC: No headache, seizures, numbness, tingling or weakness. PSYCHIATRIC: No depression, no loss of interest in normal activity or change in sleep pattern.     Exam: chaperone present  BP 130/82 mmHg  Ht 5' 2.5" (1.588 m)  Wt 158  lb 9.6 oz (71.94 kg)  BMI 28.53 kg/m2  Body mass index is 28.53 kg/(m^2).  General appearance : Well developed well nourished female. No acute distress HEENT: Eyes: no retinal hemorrhage or exudates,  Neck supple, trachea midline, no carotid bruits, no thyroidmegaly Lungs: Clear to auscultation, no rhonchi or wheezes, or rib retractions  Heart: Regular rate and rhythm, no murmurs or gallops Breast:Examined in  sitting and supine position were symmetrical in appearance, no palpable masses or tenderness,  no skin retraction, no nipple inversion, no nipple discharge, no skin discoloration, no axillary or supraclavicular lymphadenopathy Abdomen: no palpable masses or tenderness, no rebound or guarding Extremities: no edema or skin discoloration or tenderness  Pelvic:  Bartholin, Urethra, Skene Glands: Within normal limits             Vagina: No gross lesions or discharge  Cervix: No gross lesions or discharge  Uterus  anteverted, normal size, shape and consistency, non-tender and mobile  Adnexa  Without masses or tenderness  Anus and perineum  normal   Rectovaginal  normal sphincter tone without palpated masses or tenderness             Hemoccult colonoscopy next few months     Assessment/Plan:  45 y.o. female for annual exam doing well on the Depo-Provera 150 mg every 3 months. We discussed importance of calcium vitamin D and weightbearing exercises for osteoporosis prevention. She'll continue to follow-up with her PCP as well as her cardiologist. Blood work has been drawn by her PCP. Pap smear not indicated this year. Patient reminded to schedule her mammogram for next month. Discussed importance of monthly breast exams as well.    Terrance Mass MD, 9:38 AM 11/27/2015

## 2015-11-27 NOTE — Addendum Note (Signed)
Addended by: Alen Blew on: 11/27/2015 09:55 AM   Modules accepted: Orders

## 2015-12-09 ENCOUNTER — Other Ambulatory Visit: Payer: Self-pay

## 2015-12-09 DIAGNOSIS — Z1231 Encounter for screening mammogram for malignant neoplasm of breast: Secondary | ICD-10-CM

## 2015-12-16 ENCOUNTER — Ambulatory Visit (INDEPENDENT_AMBULATORY_CARE_PROVIDER_SITE_OTHER): Payer: BC Managed Care – PPO | Admitting: Family Medicine

## 2015-12-16 VITALS — BP 140/90 | HR 97 | Temp 99.2°F | Resp 20 | Ht 63.39 in | Wt 160.6 lb

## 2015-12-16 DIAGNOSIS — R05 Cough: Secondary | ICD-10-CM

## 2015-12-16 DIAGNOSIS — R059 Cough, unspecified: Secondary | ICD-10-CM

## 2015-12-16 DIAGNOSIS — J069 Acute upper respiratory infection, unspecified: Secondary | ICD-10-CM | POA: Diagnosis not present

## 2015-12-16 DIAGNOSIS — R0981 Nasal congestion: Secondary | ICD-10-CM | POA: Diagnosis not present

## 2015-12-16 MED ORDER — AZITHROMYCIN 250 MG PO TABS: ORAL_TABLET | ORAL | Status: DC

## 2015-12-16 NOTE — Progress Notes (Signed)
   Subjective:    Patient ID: Megan Lang, female    DOB: Sep 26, 1971, 45 y.o.   MRN: [id number]  HPI This is a pleasant 45 yo female who presents today with 4 days of nasal congestion, runny nose, cough with occasional yellow sputum. She has taken Mucinex sinus Max with some relief of symptoms. She has seasonal allergies and recently restarted flonase but is not currently taking antihistamine. She reports frequent sinus infections during the spring.   Past Medical History  Diagnosis Date  . Normal spontaneous vaginal delivery     3  . Hypertension   . Asthma   . Allergy   . Cardiomegaly   . Carpal tunnel syndrome   . Aortic aneurysm ([place])   . Acid reflux    Past Surgical History  Procedure Laterality Date  . Carpal tunnel release  2009  . Carpal tunnel release Left   . Nasal sinus surgery      DEC 15,2016   Family History  Problem Relation Age of Onset  . Hypertension Mother   . Kidney disease Father   . Breast cancer Maternal Grandmother   . Hypertension Sister   . Hypertension Brother   . Liver disease Father     Unknown diagnosis   Social History  Substance Use Topics  . Smoking status: Never Smoker   . Smokeless tobacco: Never Used  . Alcohol Use: No    Review of Systems  Constitutional: Positive for fatigue. Negative for fever.  HENT: Positive for congestion, postnasal drip, rhinorrhea, sinus pressure and sore throat. Negative for ear pain.   Respiratory: Positive for cough. Negative for shortness of breath and wheezing.   Cardiovascular: Negative for chest pain.  Neurological: Positive for headaches.      Objective:   Physical Exam  Constitutional: She is oriented to person, place, and time. She appears well-developed and well-nourished.  HENT:  Head: Normocephalic and atraumatic.  Right Ear: Tympanic membrane, external ear and ear canal normal.  Left Ear: Tympanic membrane, external ear and ear canal normal.  Nose: Mucosal edema and rhinorrhea  present. Right sinus exhibits maxillary sinus tenderness. Left sinus exhibits maxillary sinus tenderness.  Mouth/Throat: Oropharynx is clear and moist.  Eyes: Conjunctivae are normal.  Neck: Normal range of motion. Neck supple.  Cardiovascular: Normal rate, regular rhythm and normal heart sounds.   Pulmonary/Chest: Effort normal and breath sounds normal.  Musculoskeletal: Normal range of motion.  Lymphadenopathy:    She has no cervical adenopathy.  Neurological: She is alert and oriented to person, place, and time.  Skin: Skin is warm and dry.  Vitals reviewed.     BP 140/90 mmHg  Pulse 97  Temp(Src) 99.2 F (37.3 C) (Oral)  Resp 20  Ht 5' 3.39" (1.61 m)  Wt 160 lb 9.6 oz (72.848 kg)  BMI 28.10 kg/m2  SpO2 99%     Assessment & Plan:  1. Nasal congestion - provided written and verbal information for symptomatic relief - restart antihistamine, continue flonase  2. Cough - recommended otc prn medication  3. Acute upper respiratory infection - provided wait and see antibiotic if she is not better in 7 days - azithromycin (ZITHROMAX) 250 MG tablet; Take 2 tablets on first day then 1 a day  Dispense: 6 tablet; Refill: 0   Clarene Reamer, FNP-BC  [facility]  12/18/2015 9:48 PM

## 2015-12-16 NOTE — Patient Instructions (Addendum)
Resume Zyrtec and continue flonase For decongestant try Sudafed (generic is fine) one in the morning and one after lunch (about 6 hours after first dose) May take Afrin up to 3 days twice a day Drink plenty of water until your urine is light yellow Get plenty of rest Please start antibiotic if you are not better in 7 days Please come back in if you are not better with above interventions or if you develop shortness of breath, wheeze, worsening cough.

## 2015-12-16 NOTE — Progress Notes (Signed)
Subjective:    Patient ID: Megan Lang, female    DOB: 02/05/1971, 45 y.o.   MRN: [id number]  HPI This is a pleasant female presenting with frontal and maxillary sinus pressure, rhinorrhea, sneezing, and cough with yellow sputum since Friday (2/24). She states that her nasal drainage is clear. She has tried mucinex sinus max since yesterday (4 pills in all) with relief. Patient states that she has seasonal allergies and her symptoms are worst when she goes outside. She denies sore throat, SOB, chest pain, n/v, or wheezing.   Past Medical History  Diagnosis Date  . Normal spontaneous vaginal delivery     3  . Hypertension   . Asthma   . Allergy   . Cardiomegaly   . Carpal tunnel syndrome   . Aortic aneurysm ([place])   . Acid reflux    Past Surgical History  Procedure Laterality Date  . Carpal tunnel release  2009  . Carpal tunnel release Left   . Nasal sinus surgery      DEC 15,2016   Family History  Problem Relation Age of Onset  . Hypertension Mother   . Kidney disease Father   . Breast cancer Maternal Grandmother   . Hypertension Sister   . Hypertension Brother   . Liver disease Father     Unknown diagnosis   Social History   Social History  . Marital Status: Married    Spouse Name: N/A  . Number of Children: 3  . Years of Education: N/A   Occupational History  . Custodial     Social History Main Topics  . Smoking status: Never Smoker   . Smokeless tobacco: Never Used  . Alcohol Use: No  . Drug Use: No  . Sexual Activity: No     Comment: 1ST INTERCOURSE- 17, PARTNERS- LESS THAN 5    Other Topics Concern  . Not on file   Social History Narrative   Epworth Sleepiness Scale = 8 (as of 10/28/2015)     Review of Systems  Constitutional: Positive for activity change (feeling tired) and fatigue. Negative for fever, chills and appetite change.  HENT: Positive for congestion (bilaterally), rhinorrhea (clear), sinus pressure (maxillary and frontal) and  sneezing. Negative for sore throat.   Respiratory: Positive for cough (yellow sputum). Negative for chest tightness, shortness of breath and wheezing.   Cardiovascular: Negative for chest pain.  Gastrointestinal: Negative for nausea, vomiting and diarrhea.  Musculoskeletal: Negative for myalgias and arthralgias.  Neurological: Negative for dizziness.  Psychiatric/Behavioral: Positive for sleep disturbance (due to congestion).       Objective:   Physical Exam  Constitutional: She is oriented to person, place, and time. She appears well-developed and well-nourished.  HENT:  Head: Normocephalic.  Nose: Rhinorrhea (clear) present.   slight nasal erythema bilaterally  Neck: Normal range of motion. No thyromegaly present.  Cardiovascular: Normal rate, regular rhythm and normal heart sounds.   Pulmonary/Chest: Effort normal and breath sounds normal. No respiratory distress. She has no wheezes.  Neurological: She is alert and oriented to person, place, and time.  Skin: Skin is warm and dry.  Psychiatric: She has a normal mood and affect. Her behavior is normal. Judgment and thought content normal.    BP 140/90 mmHg  Pulse 97  Temp(Src) 99.2 F (37.3 C) (Oral)  Resp 20  Ht 5' 3.39" (1.61 m)  Wt 160 lb 9.6 oz (72.848 kg)  BMI 28.10 kg/m2  SpO2 99%       Assessment &  Plan:  1. Nasal congestion -zyrtec and continue Flonase -try sudafed once in the morning and once in after lunch (6 hours apart) -Afrin up to 3 times a day, twice a day  2. Cough   3. Upper respiratory infection -start antibiotic if you are no better in 7 days -get plenty of rest -drink plenty of water -RTC precautions reviewed with patient

## 2015-12-29 ENCOUNTER — Ambulatory Visit
Admission: RE | Admit: 2015-12-29 | Discharge: 2015-12-29 | Disposition: A | Discharge status: Home / Self Care | Payer: BC Managed Care – PPO | Source: Ambulatory Visit

## 2015-12-29 DIAGNOSIS — Z1231 Encounter for screening mammogram for malignant neoplasm of breast: Secondary | ICD-10-CM

## 2016-01-16 IMAGING — DX DG ABDOMEN ACUTE W/ 1V CHEST
3 series · 3 of 3 positions shown · non-contrast
Comparison: Chest x-ray 06/16/2015

CLINICAL DATA: Right upper and left upper quadrant abdominal pain
since this morning.

EXAM:
DG ABDOMEN ACUTE W/ 1V CHEST

[x chest ap]
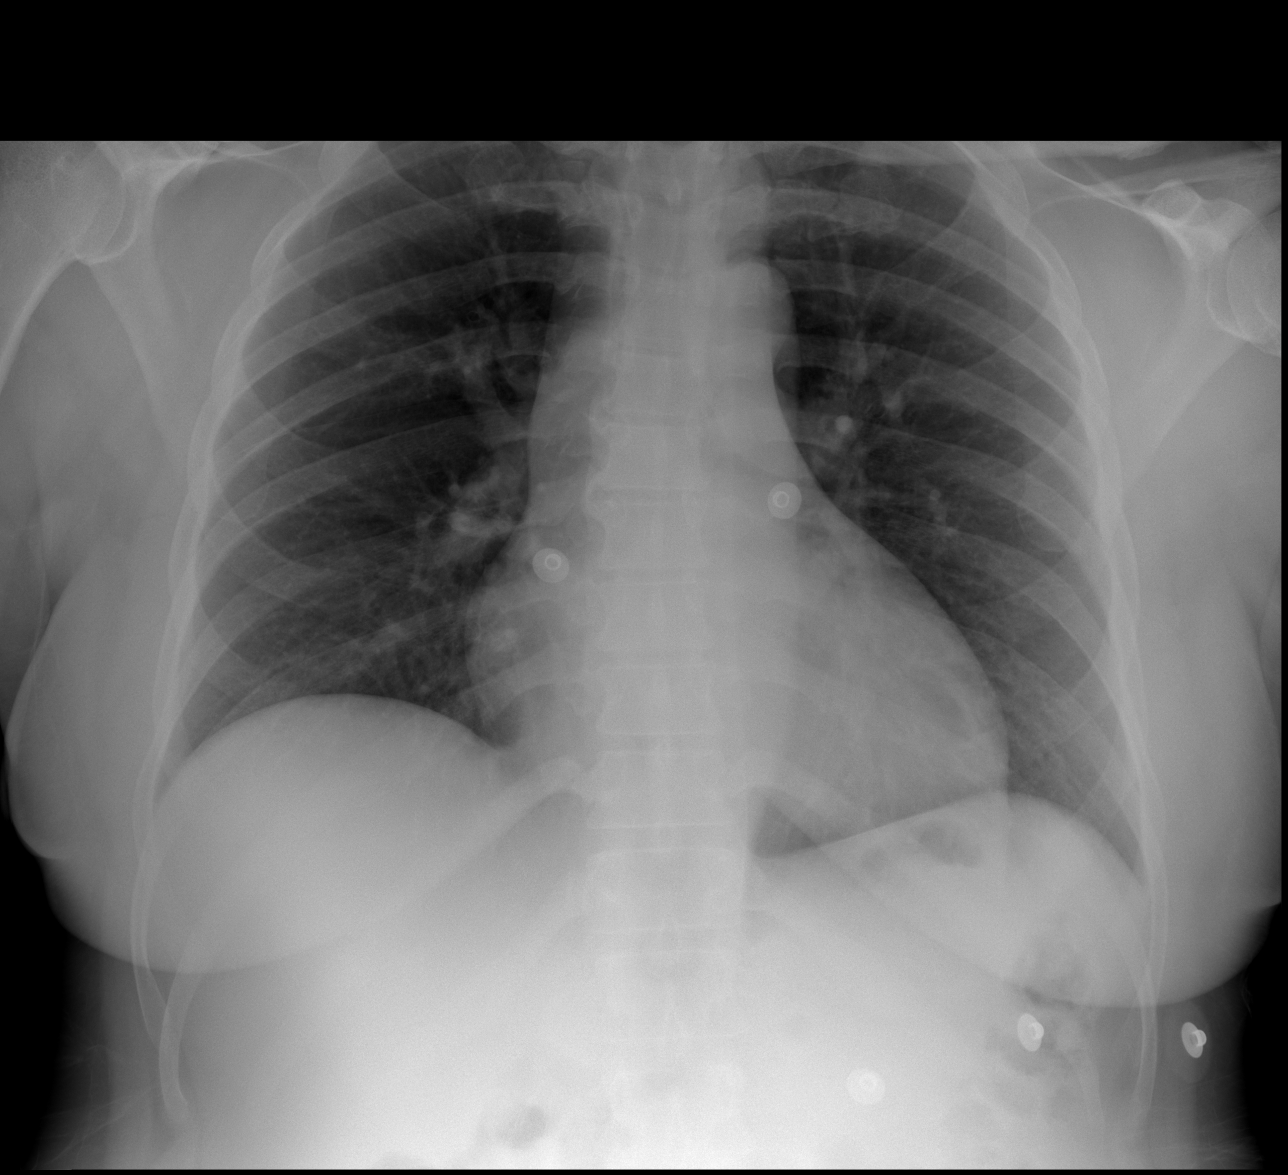

[w abdomen decub]
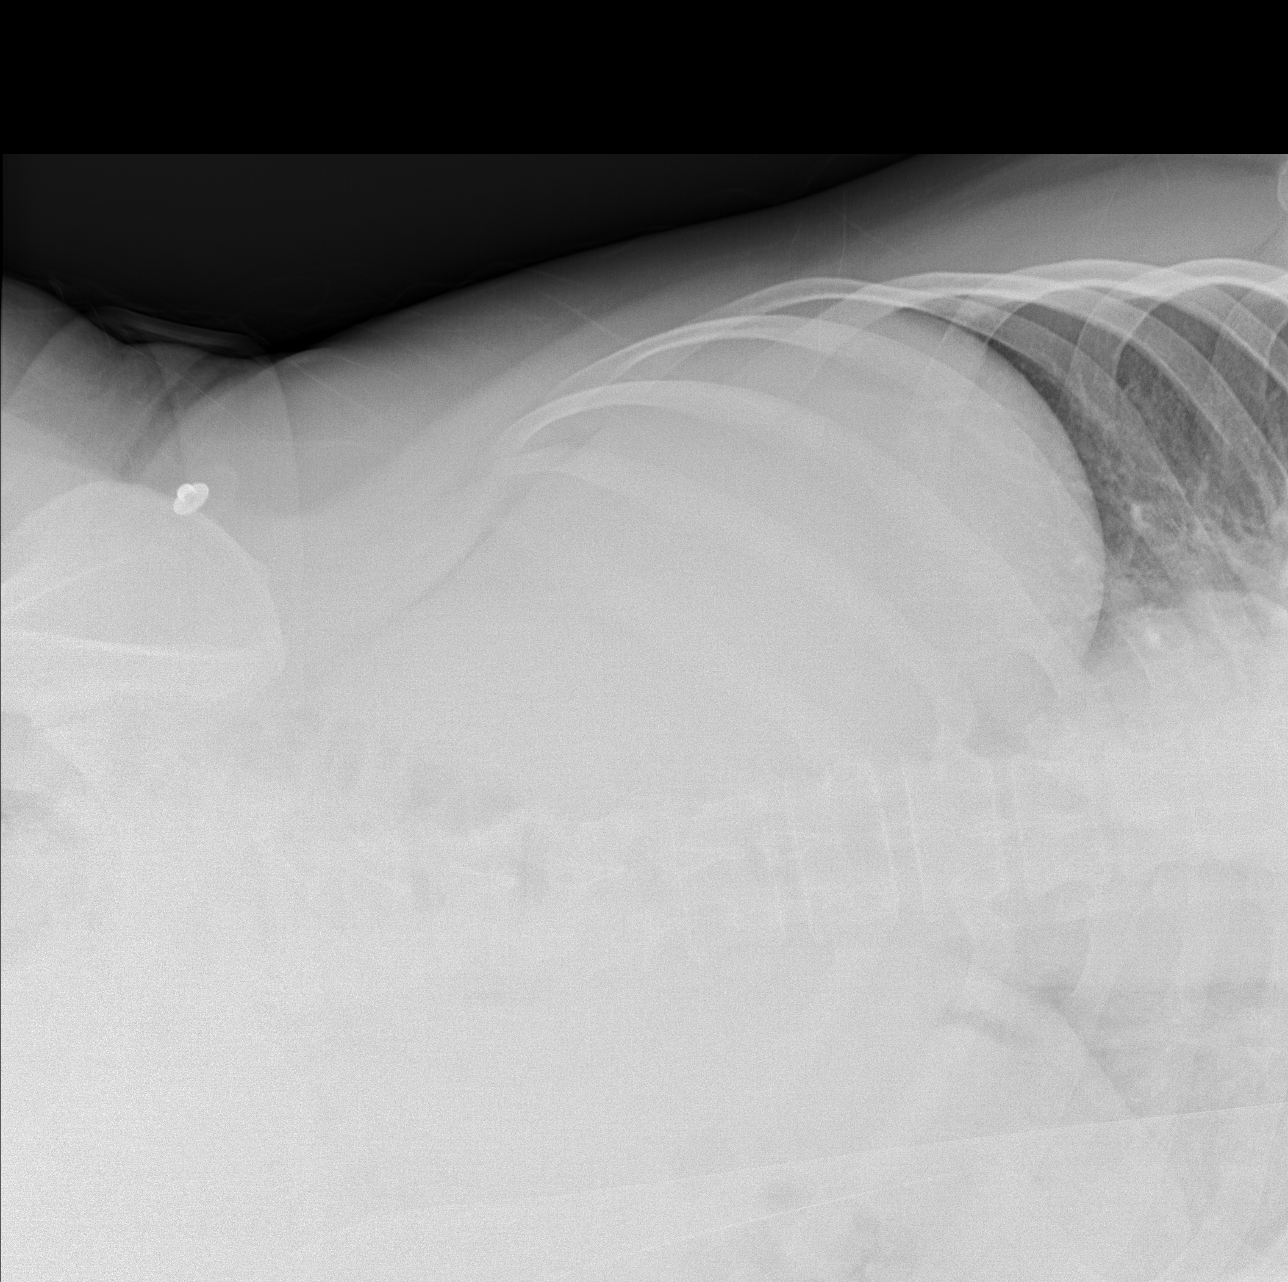

[t abdomen supine]
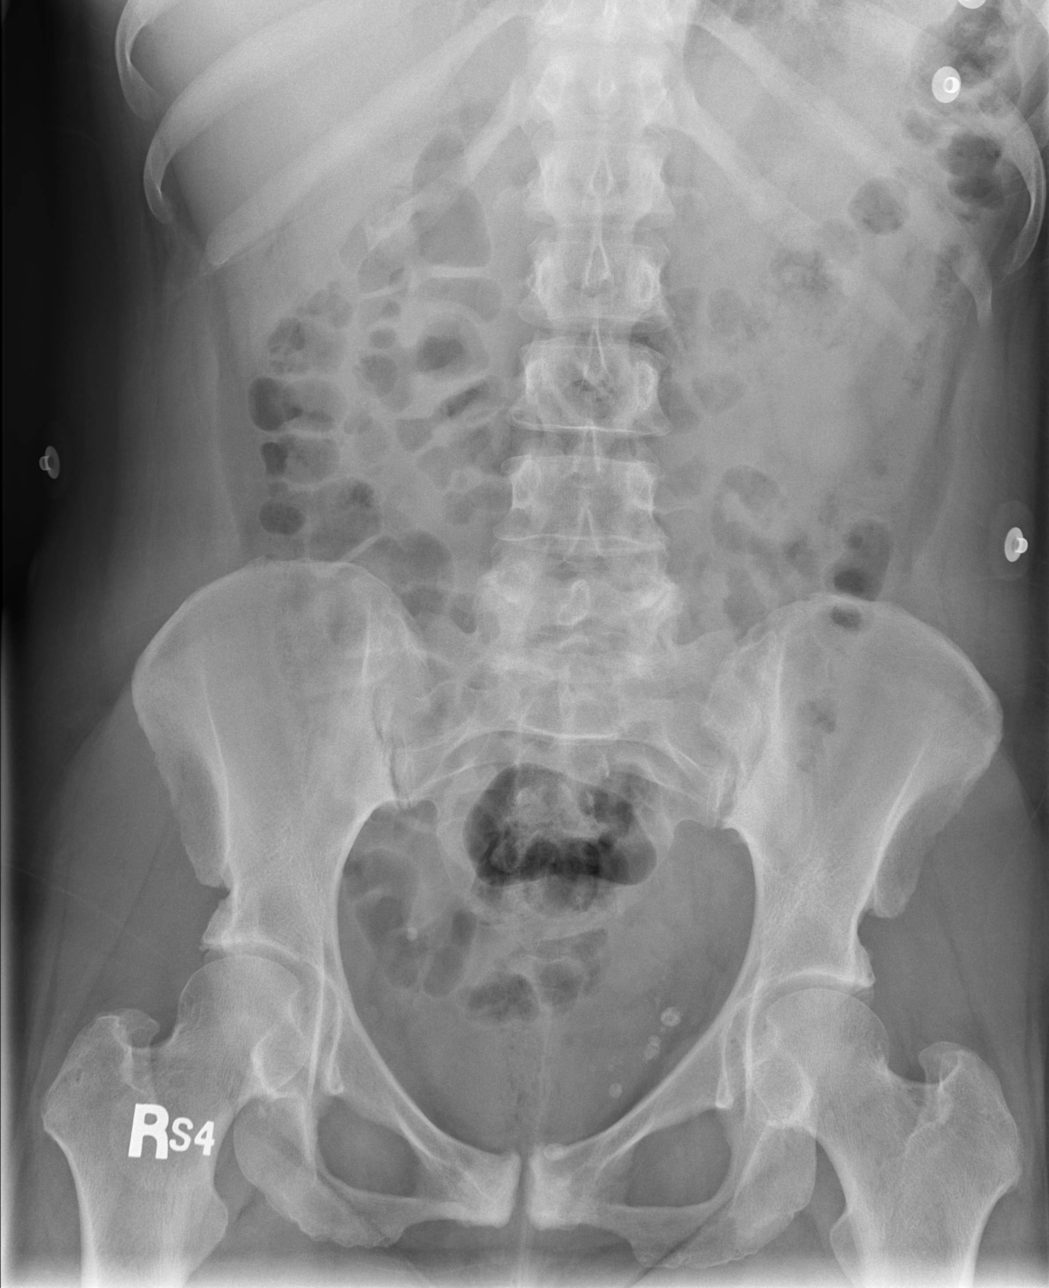

[3 of 3 positions shown; findings below may reference images not displayed]

FINDINGS: The upright chest x-ray demonstrates stable cardiomediastinal and
hilar contours. No acute pulmonary findings. No pleural effusion.
The bony thorax is intact.

Two views of the abdomen demonstrate an unremarkable bowel gas
pattern. No findings for obstruction an or perforation. The soft
tissue shadows are maintained. No worrisome calcifications. The bony
structures are intact. Osteitis pubis noted.
IMPRESSION: No acute cardiopulmonary findings.

No plain film findings for acute abdominal process.

## 2016-01-23 ENCOUNTER — Ambulatory Visit (INDEPENDENT_AMBULATORY_CARE_PROVIDER_SITE_OTHER): Payer: BC Managed Care – PPO | Admitting: Cardiovascular Disease

## 2016-01-23 ENCOUNTER — Ambulatory Visit: Payer: BC Managed Care – PPO | Admitting: Cardiovascular Disease

## 2016-01-23 ENCOUNTER — Encounter: Payer: Self-pay | Admitting: Cardiovascular Disease

## 2016-01-23 VITALS — BP 130/104 | HR 88 | Ht 63.39 in | Wt 168.0 lb

## 2016-01-23 DIAGNOSIS — K219 Gastro-esophageal reflux disease without esophagitis: Secondary | ICD-10-CM | POA: Diagnosis not present

## 2016-01-23 DIAGNOSIS — I10 Essential (primary) hypertension: Secondary | ICD-10-CM | POA: Diagnosis not present

## 2016-01-23 HISTORY — DX: Gastro-esophageal reflux disease without esophagitis: K21.9

## 2016-01-23 NOTE — Patient Instructions (Signed)
Medication Instructions:  Your physician recommends that you continue on your current medications as directed. Please refer to the Current Medication list given to you today.  Labwork: none  Testing/Procedures: none  Follow-Up: As needed   If you need a refill on your cardiac medications before your next appointment, please call your pharmacy.  

## 2016-01-23 NOTE — Progress Notes (Signed)
Cardiology Office Note   Date:  01/23/2016   ID:  Megan Lang, DOB 1971/01/13, MRN [id number]  PCP:  Megan Blinks, MD  Cardiologist:   Megan Harness, MD   Chief Complaint  Patient presents with  . Follow-up    have had some chest pain, some cramping     Patient ID:  Megan Lang is a 45 y.o. female with hypertension who presents for follow up on chest pain and shortness of breath.    Interval history 01/23/16: After her last appointment Megan Lang  had an exercise Cardiolite  that was negative for ischemia.  She also had an echo that revealed normal systolic function and no diastolic dysfunction.  There is no mention of her ascending aorta.  Megan Lang has been doing better.  She had an upper endoscopy and had a gastric polyp that was removed.  She is doing better on her PPI at bid dosing instead of daily.  She denies palpitations.  She has some R sided chest pain with lifting heavy objects.    She has been checking her BP at home and it has been in the 120s/80.  She hasn't taken her BP medications yet today.  History of Present Illness 10/28/15: Megan Lang has been noticing shortness of breath for approximately one month.  It occurs with exertion such as cleaning.  She also has shortness of breath when laying flat.  She also reports 3/10 substernal chest pain that does not radiate.  Each episode lasts for 15-20 seconds and improves with rest.  The episdoes are associated with nausea without emesis.  In September she had an episodes of severe abdominal pain that was associated with nausea and diaphoresis.  She was evaluated in the ED without finding a cause.  Megan Lang was seen in the emergency department on 12/23 complaint of chest pain.  She also reported shortness of breath and a productive cough.  Cardiac enzymes were negative. She was mildly hypokalemic with a potassium of 3.4. She had a chest CT that was negative for pulmonary embolism.  Mild enlargement of the  ascending horacic aorta was noted (4.2 x 4.1 cm).There was also an incidental noting of a pulmonary nodule.  BNP was 10. She was referred to cardiology for further evaluation.  She saw a Pulmonologist, Dr. Christinia Gully, yesterday.  No pulmonary cause of her shortness of breath was identified.  Megan Lang does not get much formal exercise.  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  She feels as though she has been slowing down at work to compensate.  rior to August she was walking and jogging on the treadmill regularly. She is unsure of why she no longer exercises as much,but she does note that she had a sinus surgery in the interim that makes it difficult for her to breathe when exercising.  Megan Lang denies lower extremity edema but does endorse orthopnea. She denies PND.  She has noted occasional palpitations that last for approximately 5 seconds at a time and are associated with shortness of breath but no chest pain or dizziness. This happens approximately twice per day.  It was occurring more frequently when she was taking antibiotics.   Past Medical History  Diagnosis Date  . Normal spontaneous vaginal delivery     3  . Hypertension   . Asthma   . Allergy   . Cardiomegaly   .  Carpal tunnel syndrome   . Aortic aneurysm ([place])   . Acid reflux   . GERD (gastroesophageal reflux disease) 01/23/2016    Past Surgical History  Procedure Laterality Date  . Carpal tunnel release  2009  . Carpal tunnel release Left   . Nasal sinus surgery      DEC 15,2016     Current Outpatient Prescriptions  Medication Sig Dispense Refill  . acetaminophen (TYLENOL) 500 MG tablet Take 500 mg by mouth.    . bisoprolol-hydrochlorothiazide (ZIAC) 2.5-6.25 MG tablet Take 1 tablet by mouth daily.    . carboxymethylcellulose (REFRESH PLUS) 0.5 % SOLN Place 1 drop into both eyes 3 (three) times daily as needed (for  eye irritation).    . fluticasone (FLONASE) 50 MCG/ACT nasal spray Place into the nose.    . ibuprofen (ADVIL,MOTRIN) 800 MG tablet     . medroxyPROGESTERone (DEPO-PROVERA) 150 MG/ML injection INJECT INTRAMUSCULARLY AS DIRECTED 1 mL 2  . Multiple Vitamin (THERA) TABS Take by mouth.    . Olopatadine HCl (PAZEO) 0.7 % SOLN Apply to eye.    Marland Kitchen omeprazole (PRILOSEC) 40 MG capsule Take 1 capsule (40 mg total) by mouth 2 (two) times daily. 60 capsule 4  . sucralfate (CARAFATE) 1 GM/10ML suspension Take 10 mLs (1 g total) by mouth 4 (four) times daily -  with meals and at bedtime. 420 mL 1   No current facility-administered medications for this visit.    Allergies:   Clindamycin; Levofloxacin; Metronidazole; Minocin; Moxifloxacin; and Penicillins    Social History:  The patient  reports that she has never smoked. She has never used smokeless tobacco. She reports that she does not drink alcohol or use illicit drugs.   Family History:  The patient's family history includes Breast cancer in her maternal grandmother; Hypertension in her brother, mother, and sister; Kidney disease in her father; Liver disease in her father.    ROS:  Please see the history of present illness.   Otherwise, review of systems are positive for none.   All other systems are reviewed and negative.    PHYSICAL EXAM: VS:  BP 130/104 mmHg  Pulse 88  Ht 5' 3.39" (1.61 m)  Wt 76.204 kg (168 lb)  BMI 29.40 kg/m2 , BMI Body mass index is 29.4 kg/(m^2). GENERAL:  Well appearing HEENT:  Pupils equal round and reactive, fundi not visualized, oral mucosa unremarkable NECK:  No jugular venous distention, waveform within normal limits, carotid upstroke brisk and symmetric, no bruits LYMPHATICS:  No cervical adenopathy LUNGS:  Clear to auscultation bilaterally HEART:  RRR.  PMI not displaced or sustained,S1 and S2 within normal limits, no S3, no S4, no clicks, no rubs, no murmurs ABD:  Flat, positive bowel sounds normal in  frequency in pitch, no bruits, no rebound, no guarding, no midline pulsatile mass, no hepatomegaly, no splenomegaly EXT:  2 plus pulses throughout, no edema, no cyanosis no clubbing SKIN:  No rashes no nodules NEURO:  Cranial nerves II through XII grossly intact, motor grossly intact throughout PSYCH:  Cognitively intact, oriented to person place and time   EKG:  EKG is ordered today. The ekg ordered today demonstrates inus rhythm Rate 87 bpm. Nonspecific T wave abnormalities.  Exercise Cardiolite 11/04/15:  The left ventricular ejection fraction is normal (55-65%).  Nuclear stress EF: 61%.  Blood pressure demonstrated a blunted response to exercise.  There was no ST segment deviation noted during stress.  The study is normal.  Normal stress nuclear study with  no ischemia or infarction; EF 61 with normal wall motion.   Echo 10/30/15: Study Conclusions  - Left ventricle: The cavity size was normal. There was mild focal  basal hypertrophy of the septum. Systolic function was normal.  The estimated ejection fraction was in the range of 55% to 60%.  Wall motion was normal; there were no regional wall motion  abnormalities. Left ventricular diastolic function parameters  were normal.   Recent Labs: 10/10/2015: B Natriuretic Peptide 10.3 10/18/2015: ALT 41; Hemoglobin 12.7; Platelets 313 10/28/2015: BUN 8; Creat 0.74; Magnesium 2.1; Potassium 3.6; Sodium 139; TSH 0.574    Lipid Panel    Component Value Date/Time   CHOL 140 11/14/2014 1020   TRIG 69 11/14/2014 1020   HDL 49 11/14/2014 1020   CHOLHDL 2.9 11/14/2014 1020   VLDL 14 11/14/2014 1020   LDLCALC 77 11/14/2014 1020      Wt Readings from Last 3 Encounters:  01/23/16 76.204 kg (168 lb)  12/16/15 72.848 kg (160 lb 9.6 oz)  11/27/15 71.94 kg (158 lb 9.6 oz)      ASSESSMENT AND PLAN:  # Chest pain:  Resolved with treatment of GERD.  # Shortness of breath: It is unclear what is causing Ms. Falzon's  shortness of breath. Echo and stress testing were unremarkable.  She was encouraged to increase her exercise gradually to build her endurance.   # Palpitations: Resolved  # Hypertension: Blood pressure is above goal today.  However she has not taken her morning dose yet today. On repeat it was 146/82.   She states that it is well-controlled at home. Continue bisoprolol and hydrochlorothiazide.   # Mild aortic aneurysm: Ms. Rase has a mild ascending aortic aneurysm measuring 4.1 x 4.2 cm. No intervention is required at this time. She is being followed by Dr. Prescott Gum and will have a repeat CT scan in 1 year.  Blood pressure control as above.  Current medicines are reviewed at length with the patient today.  The patient does not have concerns regarding medicines.  The following changes have been made:  no change  Labs/ tests ordered today include:   No orders of the defined types were placed in this encounter.     Disposition:   FU with Macrina Lehnert C. Oval Linsey, MD, [facility] as needed.   This note was written with the assistance of speech recognition software.  Please excuse any transcriptional errors.  Signed, Lezly Rumpf C. Oval Linsey, MD, [facility]  01/23/2016 8:35 AM    [place]

## 2016-01-26 ENCOUNTER — Ambulatory Visit: Payer: BC Managed Care – PPO | Admitting: Cardiovascular Disease

## 2016-02-09 ENCOUNTER — Other Ambulatory Visit: Payer: Self-pay | Admitting: Gynecology

## 2016-02-09 ENCOUNTER — Ambulatory Visit: Payer: BC Managed Care – PPO | Admitting: Cardiovascular Disease

## 2016-02-20 ENCOUNTER — Ambulatory Visit (INDEPENDENT_AMBULATORY_CARE_PROVIDER_SITE_OTHER): Payer: BC Managed Care – PPO | Admitting: Gynecology

## 2016-02-20 DIAGNOSIS — Z3042 Encounter for surveillance of injectable contraceptive: Secondary | ICD-10-CM | POA: Diagnosis not present

## 2016-02-20 MED ORDER — MEDROXYPROGESTERONE ACETATE 150 MG/ML IM SUSP
150.0000 mg | Freq: Once | INTRAMUSCULAR | Status: AC
  Administered 2016-02-20: 150 mg via INTRAMUSCULAR

## 2016-03-01 ENCOUNTER — Telehealth: Payer: Self-pay | Admitting: *Deleted

## 2016-03-01 NOTE — Telephone Encounter (Signed)
Megan Lang has called to relate a several week history of chest pains.  She was wondering if they could be associated with her thoracic aortic aneurysm. I told her the chest and back pain she would have if her aneurysm had dissected or ruptured would be very severe and she would be seeking medical care immediately.  She said it was nothing like that. The pains come and go.  She has GERD, also. I suggested she f/u with her PCP and she agreed.

## 2016-03-19 ENCOUNTER — Telehealth: Payer: Self-pay | Admitting: Gastroenterology

## 2016-03-19 NOTE — Telephone Encounter (Signed)
Left a message for patient to call back. 

## 2016-03-19 NOTE — Telephone Encounter (Signed)
Patient states she is having a burning sensation in left side below rib cage. She is taking Omeprazole 40 mg BID and it is not helping. Please, advise.

## 2016-03-19 NOTE — Telephone Encounter (Signed)
Is the omeprazole helping with her other reflux symptoms? If she has other reflux symptoms which are persisting it could be due to reflux, but if her reflux symptoms are otherwise well controlled, then this burning sensation may likely be unrelated to her reflux. I would need to see her in clinic to assess this issue. Thanks

## 2016-03-23 NOTE — Telephone Encounter (Signed)
Left a message for patient to call back. 

## 2016-03-24 ENCOUNTER — Encounter: Payer: Self-pay | Admitting: Gastroenterology

## 2016-03-24 NOTE — Telephone Encounter (Signed)
Spoke with patient and scheduled OV on 05/12/16 with Dr. Havery Moros for burning pain.

## 2016-05-07 ENCOUNTER — Encounter: Payer: Self-pay | Admitting: *Deleted

## 2016-05-12 ENCOUNTER — Encounter: Payer: Self-pay | Admitting: Gastroenterology

## 2016-05-12 ENCOUNTER — Ambulatory Visit (INDEPENDENT_AMBULATORY_CARE_PROVIDER_SITE_OTHER): Payer: BC Managed Care – PPO | Admitting: Gastroenterology

## 2016-05-12 ENCOUNTER — Ambulatory Visit (INDEPENDENT_AMBULATORY_CARE_PROVIDER_SITE_OTHER): Payer: BC Managed Care – PPO | Admitting: Anesthesiology

## 2016-05-12 VITALS — BP 150/100 | Ht 63.0 in | Wt 181.0 lb

## 2016-05-12 DIAGNOSIS — Z3042 Encounter for surveillance of injectable contraceptive: Secondary | ICD-10-CM

## 2016-05-12 DIAGNOSIS — R1011 Right upper quadrant pain: Secondary | ICD-10-CM

## 2016-05-12 DIAGNOSIS — K219 Gastro-esophageal reflux disease without esophagitis: Secondary | ICD-10-CM | POA: Diagnosis not present

## 2016-05-12 DIAGNOSIS — Z1211 Encounter for screening for malignant neoplasm of colon: Secondary | ICD-10-CM

## 2016-05-12 MED ORDER — NA SULFATE-K SULFATE-MG SULF 17.5-3.13-1.6 GM/177ML PO SOLN: ORAL | 0 refills | Status: DC

## 2016-05-12 MED ORDER — OMEPRAZOLE 40 MG PO CPDR: 40.0000 mg | DELAYED_RELEASE_CAPSULE | Freq: Two times a day (BID) | ORAL | 4 refills | Status: DC

## 2016-05-12 MED ORDER — MEDROXYPROGESTERONE ACETATE 150 MG/ML IM SUSP
150.0000 mg | Freq: Once | INTRAMUSCULAR | Status: AC
  Administered 2016-05-12: 150 mg via INTRAMUSCULAR

## 2016-05-12 NOTE — Patient Instructions (Signed)
You have been scheduled for a colonoscopy. Please follow written instructions given to you at your visit today.  Please pick up your prep supplies at the pharmacy within the next 1-3 days. If you use inhalers (even only as needed), please bring them with you on the day of your procedure. Your physician has requested that you go to www.startemmi.com and enter the access code given to you at your visit today. This web site gives a general overview about your procedure. However, you should still follow specific instructions given to you by our office regarding your preparation for the procedure.  We have sent the following medications to your pharmacy for you to pick up at your convenience: omeprazole 40 mg daily  You have been scheduled for a HIDA scan at [facility] (1st floor) on 05/31/16. Please arrive 15 minutes prior to your scheduled appointment at  [id number] am. Make certain not to have anything to eat or drink at least 6 hours prior to your test. Should this appointment date or time not work well for you, please call radiology scheduling at [phone].  _____________________________________________________________________ hepatobiliary (HIDA) scan is an imaging procedure used to diagnose problems in the liver, gallbladder and bile ducts. In the HIDA scan, a radioactive chemical or tracer is injected into a vein in your arm. The tracer is handled by the liver like bile. Bile is a fluid produced and excreted by your liver that helps your digestive system break down fats in the foods you eat. Bile is stored in your gallbladder and the gallbladder releases the bile when you eat a meal. A special nuclear medicine scanner (gamma camera) tracks the flow of the tracer from your liver into your gallbladder and small intestine.  During your HIDA scan  You'll be asked to change into a hospital gown before your HIDA scan begins. Your health care team will position you on a table, usually on your back.  The radioactive tracer is then injected into a vein in your arm.The tracer travels through your bloodstream to your liver, where it's taken up by the bile-producing cells. The radioactive tracer travels with the bile from your liver into your gallbladder and through your bile ducts to your small intestine.You may feel some pressure while the radioactive tracer is injected into your vein. As you lie on the table, a special gamma camera is positioned over your abdomen taking pictures of the tracer as it moves through your body. The gamma camera takes pictures continually for about an hour. You'll need to keep still during the HIDA scan. This can become uncomfortable, but you may find that you can lessen the discomfort by taking deep breaths and thinking about other things. Tell your health care team if you're uncomfortable. The radiologist will watch on a computer the progress of the radioactive tracer through your body. The HIDA scan may be stopped when the radioactive tracer is seen in the gallbladder and enters your small intestine. This typically takes about an hour. In some cases extra imaging will be performed if original images aren't satisfactory, if morphine is given to help visualize the gallbladder or if the medication CCK is given to look at the contraction of the gallbladder. This test typically takes 2 hours to complete. ________________________________________________________________________ If you are age 45 or older, your body mass index should be between 23-30. Your Body mass index is 32.06 kg/m. If this is out of the aforementioned range listed, please consider follow up with your Primary Care Provider.  If you are age 45 or younger, your body mass index should be between 19-25. Your Body mass index is 32.06 kg/m. If this is out of the aformentioned range listed, please consider follow up with your Primary Care Provider.

## 2016-05-12 NOTE — Progress Notes (Signed)
HPI :  INTAKE VISIT: 45 y/o female here for follow up. She was previously seen for complaints of dysphagia which were thought to be due to tonsillitis. She had completed course of antibiotics and her oropharyngeal dysphagia had resolved.   She is now here for evaluation for new symptoms that have started since I have last seen her. She has been having intermittent chest pain, dyspnea, and palpitations. She has also had a burning discomfort in her abdomen and chest, it's been going on since early December. She reports her stomach and chest keeps her up at night, she feels a burning sensation in her epigastric to LUQ area, and it also in her lower chest. She has been to the ER 4 times for this discomfort in recent motnsh. She reports it comes and goes. sometimes made worse with exertion, sometimes at rest, it occurs sporadically. She reported it was initially constant, but now it comes and goes. She feels it dailyy, multiple times per day, lasts 15-20 minutes at a time and then resolves. She thinks certain foods can make it worse. She has some nausea, but not vomiting. She denies dysphagia but thinks she has the sensation to clear her throat a lot. When I last saw her she had symptoms of dysphagia thought to be due to her tonsils, followed by ENT. She was treated with antibiotics and these symptoms have resolved. She has mild odynophagia which has been ongoing for a while, usually only if she takes a very large bite. She thinks she may have lost about 10 lbs in the past few months. She had sinus surgery. She has been on multiple courses of antibiotics for her tonsils and her sinus issues. She is not sure if this made her abdominal discomfort worse. She has since stopped all of her antibiotics. She is taking protonix 40mg  twice daily. She is taking it 30-40 minutes before meals. She is taking carafate PRN which she thinks helps somewhat, more so than anything else.  She has been trying to avoid NSAIDs.    She had a CT angio of the chest which showed a mild aortic aneurysm for which she has seen surgery. She also had pulmonary nodule. CT abdomen was normal in October 2016.  She has a nuclear stress test scheduled for next week, she had an echocardiogram recently, done per cardiology. Echocardiogram was normal.   SINCE LAST VISIT:  EGD 11/07/15 - small hiatal hernia, 1cm, normal esophagus, normal exam other than small polyp hyperplastic  We switched her to omeprazole to see if it would help her symptoms. She is taking 40mg  twice daily. She thinks it is working for her symptoms and she is feeling much better. Sometimes certain foods can trigger mild breakthrough. If she avoids trigger food she generally does well. Initial symptoms were chest pain with pyrosis.  She has questions about long term use of PPIs and risks. She has tried once daily dosing and did pretty well.   She otherwise has a few months of intermittent pains that are short lived, mostly in the RUQ She thinks related to eating. She has some pain develope in the RUQ frequently after she eats. No radiation. She had a US GB in Sept which was normal.   She had a stress test with cardiology which was negative.  She has never had a prior colonoscopy. She denies any blood in the stools. NO FH of colon cancer.    Past Medical History:  Diagnosis Date  . Acid reflux   .  Allergy   . Aortic aneurysm ([place])   . Asthma   . Cardiomegaly   . Carpal tunnel syndrome   . Gastric polyp   . GERD (gastroesophageal reflux disease) 01/23/2016  . Hiatal hernia   . Hypertension   . Normal spontaneous vaginal delivery    3     Past Surgical History:  Procedure Laterality Date  . CARPAL TUNNEL RELEASE  2009  . CARPAL TUNNEL RELEASE Left   . NASAL SINUS SURGERY     DEC 15,2016   Family History  Problem Relation Age of Onset  . Hypertension Mother   . Kidney disease Father   . Liver disease Father     Unknown diagnosis  . Breast cancer  Maternal Grandmother   . Hypertension Sister   . Hypertension Brother    Social History  Substance Use Topics  . Smoking status: Never Smoker  . Smokeless tobacco: Never Used  . Alcohol use No   Current Outpatient Prescriptions  Medication Sig Dispense Refill  . acetaminophen (TYLENOL) 500 MG tablet Take 500 mg by mouth.    . bisoprolol-hydrochlorothiazide (ZIAC) 2.5-6.25 MG tablet Take 1 tablet by mouth daily.    . carboxymethylcellulose (REFRESH PLUS) 0.5 % SOLN Place 1 drop into both eyes 3 (three) times daily as needed (for eye irritation).    . fluticasone (FLONASE) 50 MCG/ACT nasal spray Place into the nose.    . ibuprofen (ADVIL,MOTRIN) 800 MG tablet     . medroxyPROGESTERone (DEPO-PROVERA) 150 MG/ML injection INJECT INTRAMUSCULARLY AS DIRECTED 1 mL 2  . MedroxyPROGESTERone Acetate 150 MG/ML SUSY INJECT INTRAMUSCULARLY AS DIRECTED 1 mL 3  . Multiple Vitamin (THERA) TABS Take by mouth.    . Na Sulfate-K Sulfate-Mg Sulf 17.5-3.13-1.6 GM/180ML SOLN Suprep-Use as directed 354 mL 0  . Olopatadine HCl (PAZEO) 0.7 % SOLN Apply to eye.    Marland Kitchen omeprazole (PRILOSEC) 40 MG capsule Take 1 capsule (40 mg total) by mouth 2 (two) times daily. 60 capsule 4  . sucralfate (CARAFATE) 1 GM/10ML suspension Take 10 mLs (1 g total) by mouth 4 (four) times daily -  with meals and at bedtime. 420 mL 1   No current facility-administered medications for this visit.    Allergies  Allergen Reactions  . Clindamycin Diarrhea and Other (See Comments)    GI upset  . Levofloxacin Other (See Comments)    other  . Metronidazole Other (See Comments)    Other  . Minocin [Minocycline Hcl] Other (See Comments)    Stomach Pains  . Moxifloxacin Swelling    other  . Penicillins Rash    Has patient had a PCN reaction causing immediate rash, facial/tongue/throat swelling, SOB or lightheadedness with hypotension:yes Has patient had a PCN reaction causing severe rash involving mucus membranes or skin necrosis:no Has  patient had a PCN reaction that required hospitalization:no Has patient had a PCN reaction occurring within the last 10 years:No If all of the above answers are "NO", then may proceed with Cephalosporin     Review of Systems: All systems reviewed and negative except where noted in HPI.   Lab Results  Component Value Date   CREATININE 0.74 10/28/2015   BUN 8 10/28/2015   NA 139 10/28/2015   K 3.6 10/28/2015   CL 105 10/28/2015   CO2 25 10/28/2015    Lab Results  Component Value Date   WBC 5.9 10/18/2015   HGB 12.7 10/18/2015   HCT 38.2 10/18/2015   MCV 88.4 10/18/2015   PLT  313 10/18/2015    Lab Results  Component Value Date   ALT 41 10/18/2015   AST 20 10/18/2015   ALKPHOS 65 10/18/2015   BILITOT 0.4 10/18/2015     Physical Exam: BP (!) 150/100   Ht 5\' 3"  (1.6 m)   Wt 181 lb (82.1 kg)   BMI 32.06 kg/m  Constitutional: Pleasant,well-developed,female in no acute distress. HEENT: Normocephalic and atraumatic. Conjunctivae are normal. No scleral icterus. Neck supple.  Cardiovascular: Normal rate, regular rhythm.  Pulmonary/chest: Effort normal and breath sounds normal. No wheezing, rales or rhonchi. Abdominal: Soft, protuberant, nondistended, nontender. Bowel sounds active throughout. There are no masses palpable. No hepatomegaly. Extremities: no edema Lymphadenopathy: No cervical adenopathy noted. Neurological: Alert and oriented to person place and time. Skin: Skin is warm and dry. No rashes noted. Psychiatric: Normal mood and affect. Behavior is normal.   ASSESSMENT AND PLAN: 45 y/o AA female here for follow up for the following issues:  GERD - much better controlled on BID prilosec. EGD without Barrett's or concerning findings, she likely has non-erosive reflux disease ([place]). Counseled her on the long term risks of PPIs and goal to dose reduce to lowest dose possible needed to control symptoms or trial of alternative regimen such as H2 blocker. Will switch  to 40mg  daily omeprazole to see if this continues to hold, if so, will try to wean down further. She should avoid trigger foods.   RUQ pain - described as above, EGD without etiology, biliary colic is possible. US showed no gallstones, will refer for HIDA  CRC screening - discussed options, in need for first time screening. After discussion of risks / benefits, she wished to proceed with colonoscopy.   Novato Cellar, MD [facility] Pager [phone]

## 2016-05-26 ENCOUNTER — Encounter: Payer: Self-pay | Admitting: Family Medicine

## 2016-05-26 ENCOUNTER — Ambulatory Visit (INDEPENDENT_AMBULATORY_CARE_PROVIDER_SITE_OTHER): Payer: BC Managed Care – PPO | Admitting: Family Medicine

## 2016-05-26 VITALS — BP 166/104 | HR 90 | Temp 98.0°F | Resp 16 | Ht 64.0 in | Wt 181.6 lb

## 2016-05-26 DIAGNOSIS — R002 Palpitations: Secondary | ICD-10-CM | POA: Diagnosis not present

## 2016-05-26 DIAGNOSIS — I10 Essential (primary) hypertension: Secondary | ICD-10-CM | POA: Diagnosis not present

## 2016-05-26 DIAGNOSIS — G8929 Other chronic pain: Secondary | ICD-10-CM

## 2016-05-26 DIAGNOSIS — Z1329 Encounter for screening for other suspected endocrine disorder: Secondary | ICD-10-CM | POA: Diagnosis not present

## 2016-05-26 DIAGNOSIS — Z136 Encounter for screening for cardiovascular disorders: Secondary | ICD-10-CM | POA: Diagnosis not present

## 2016-05-26 DIAGNOSIS — Z3042 Encounter for surveillance of injectable contraceptive: Secondary | ICD-10-CM

## 2016-05-26 DIAGNOSIS — R1031 Right lower quadrant pain: Secondary | ICD-10-CM | POA: Diagnosis not present

## 2016-05-26 LAB — CBC WITH DIFFERENTIAL/PLATELET
BASOS PCT: 1 %
Basophils Absolute: 41 cells/uL (ref 0–200)
Eosinophils Absolute: 123 cells/uL (ref 15–500)
Eosinophils Relative: 3 %
HCT: 45 % (ref 35.0–45.0)
Hemoglobin: 14.9 g/dL (ref 11.7–15.5)
LYMPHS PCT: 40 %
Lymphs Abs: 1640 cells/uL (ref 850–3900)
MCH: 29.7 pg (ref 27.0–33.0)
MCHC: 33.1 g/dL (ref 32.0–36.0)
MCV: 89.6 fL (ref 80.0–100.0)
MONOS PCT: 7 %
MPV: 9.3 fL (ref 7.5–12.5)
Monocytes Absolute: 287 cells/uL (ref 200–950)
Neutro Abs: 2009 cells/uL (ref 1500–7800)
Neutrophils Relative %: 49 %
PLATELETS: 300 10*3/uL (ref 140–400)
RBC: 5.02 MIL/uL (ref 3.80–5.10)
RDW: 13.2 % (ref 11.0–15.0)
WBC: 4.1 10*3/uL (ref 3.8–10.8)

## 2016-05-26 LAB — COMPLETE METABOLIC PANEL WITH GFR
ALBUMIN: 4.3 g/dL (ref 3.6–5.1)
ALK PHOS: 66 U/L (ref 33–115)
ALT: 20 U/L (ref 6–29)
AST: 21 U/L (ref 10–35)
BUN: 12 mg/dL (ref 7–25)
CALCIUM: 9.5 mg/dL (ref 8.6–10.2)
CO2: 24 mmol/L (ref 20–31)
Chloride: 107 mmol/L (ref 98–110)
Creat: 0.76 mg/dL (ref 0.50–1.10)
Glucose, Bld: 84 mg/dL (ref 65–99)
POTASSIUM: 3.9 mmol/L (ref 3.5–5.3)
Sodium: 139 mmol/L (ref 135–146)
Total Bilirubin: 0.9 mg/dL (ref 0.2–1.2)
Total Protein: 7.3 g/dL (ref 6.1–8.1)

## 2016-05-26 LAB — POC MICROSCOPIC URINALYSIS (UMFC)

## 2016-05-26 LAB — POCT URINALYSIS DIP (MANUAL ENTRY)
Bilirubin, UA: NEGATIVE
GLUCOSE UA: NEGATIVE
Ketones, POC UA: NEGATIVE
Leukocytes, UA: NEGATIVE
Nitrite, UA: NEGATIVE
PH UA: 6.5
Protein Ur, POC: NEGATIVE
RBC UA: NEGATIVE
SPEC GRAV UA: 1.02
UROBILINOGEN UA: 0.2

## 2016-05-26 LAB — LIPID PANEL
CHOL/HDL RATIO: 2.6 ratio (ref <=5.0)
Cholesterol: 136 mg/dL (ref 125–200)
HDL: 53 mg/dL (ref >=46)
LDL CALC: 68 mg/dL (ref <130)
Triglycerides: 73 mg/dL (ref <150)
VLDL: 15 mg/dL (ref <30)

## 2016-05-26 LAB — MAGNESIUM: MAGNESIUM: 2 mg/dL (ref 1.5–2.5)

## 2016-05-26 LAB — TSH: TSH: 0.98 mIU/L

## 2016-05-26 MED ORDER — OMEPRAZOLE 40 MG PO CPDR: 40.0000 mg | DELAYED_RELEASE_CAPSULE | Freq: Once | ORAL | 4 refills | Status: DC

## 2016-05-26 MED ORDER — HYDROCHLOROTHIAZIDE 12.5 MG PO TABS: 6.5000 mg | ORAL_TABLET | Freq: Every day | ORAL | 2 refills | Status: DC

## 2016-05-26 MED ORDER — AMLODIPINE BESYLATE 10 MG PO TABS: 10.0000 mg | ORAL_TABLET | Freq: Every day | ORAL | 1 refills | Status: DC

## 2016-05-26 NOTE — Progress Notes (Signed)
Patient ID: Megan Lang, female    DOB: 02-11-1971, 45 y.o.   MRN: [id number]  PCP: Lamar Blinks, MD  Chief Complaint  Patient presents with  . Medication Problem    states that her bp is up/down; may be due to bp medication  . Other    wants to have blood work done    Subjective:   HPI Presents for evaluation of uncontrolled hypertension.  Hypertension Checks blood pressure at home 138/83. Aortic aneurysm  History of palpitation and chest pain That was treated with replacing potassium. Echo and stress done 1/17   Abdominal pain Follow-up by Gastroenterology Dr. Havery Moros. Patient reports gallbladder study ordered to evaluate over functioning and to determine reason for chronic right lower quadrant abdominal pain.  Patient reports symptoms occur almost immediately after meals.  She is no longer experiencing reflux symptoms since removal of a benign polyp back in January and being placed on chronic omeprazole  She reports that she has a colonoscopy scheduled for 05/3016.  She remains sexually active and uses depro-provera for birth control. She reports depo-provera use for several years. Feels that use has increased her overall weight gain.  Social History   Social History  . Marital status: Married    Spouse name: N/A  . Number of children: 3  . 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 Smoking status: Never Smoker  . Smokeless tobacco: Never Used  . Alcohol use No  . Drug use: No  . 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 Not on file   Social History Narrative   Epworth Sleepiness Scale = 8 (as of 10/28/2015)     Family History  Problem Relation Age of Onset  . Hypertension Mother   . Kidney disease Father   . Liver disease Father     Unknown diagnosis  . Breast cancer Maternal Grandmother   . Hypertension Sister   .  Hypertension Brother     Review of Systems  Constitutional: Positive for fatigue.  Respiratory: Negative.   Cardiovascular: Positive for palpitations.       SEE HPI  Gastrointestinal: Positive for abdominal pain.       See HPI  Genitourinary: Negative.   Skin: Negative.   Neurological: Negative.     Patient Active Problem List   Diagnosis Date Noted  . GERD (gastroesophageal reflux disease) 01/23/2016  . Dyspnea 10/27/2015  . Costochondritis 08/26/2015  . Obesity 07/01/2015  . Upper airway cough syndrome 06/30/2015  . History of shingles 01/26/2015  . Family history of colon cancer 10/27/2011  . Essential hypertension 10/27/2011     Prior to Admission medications   Medication Sig Start Date End Date Taking? Authorizing Provider  acetaminophen (TYLENOL) 500 MG tablet Take 500 mg by mouth.   Yes Historical Provider, MD  bisoprolol-hydrochlorothiazide (ZIAC) 2.5-6.25 MG tablet Take 1 tablet by mouth daily.   Yes Historical Provider, MD  carboxymethylcellulose (REFRESH PLUS) 0.5 % SOLN Place 1 drop into both eyes 3 (three) times daily as needed (for eye irritation).   Yes Historical Provider, MD  fluticasone (FLONASE) 50 MCG/ACT nasal spray Place into the nose.   Yes Historical Provider, MD  ibuprofen (ADVIL,MOTRIN) 800 MG tablet  10/28/15  Yes Historical Provider, MD  medroxyPROGESTERone (DEPO-PROVERA) 150 MG/ML injection INJECT INTRAMUSCULARLY AS DIRECTED 03/05/15  Yes Starlyn Skeans  Toney Rakes, MD  MedroxyPROGESTERone Acetate 150 MG/ML SUSY INJECT INTRAMUSCULARLY AS DIRECTED 02/10/16  Yes Terrance Mass, MD  Multiple Vitamin (THERA) TABS Take by mouth.   Yes Historical Provider, MD  Na Sulfate-K Sulfate-Mg Sulf 17.5-3.13-1.6 GM/180ML SOLN Suprep-Use as directed 05/12/16  Yes Manus Gunning, MD  Olopatadine HCl (PAZEO) 0.7 % SOLN Apply to eye.   Yes Historical Provider, MD  omeprazole (PRILOSEC) 40 MG capsule Take 1 capsule (40 mg total) by mouth 2 (two) times daily. 05/12/16  Yes  Manus Gunning, MD  sucralfate (CARAFATE) 1 GM/10ML suspension Take 10 mLs (1 g total) by mouth 4 (four) times daily -  with meals and at bedtime. 10/31/15  Yes Manus Gunning, MD     Allergies  Allergen Reactions  . Clindamycin Diarrhea and Other (See Comments)    GI upset  . Levofloxacin Other (See Comments)    other  . Metronidazole Other (See Comments)    Other  . Minocin [Minocycline Hcl] Other (See Comments)    Stomach Pains  . Moxifloxacin Swelling    other  . Penicillins Rash    Has patient had a PCN reaction causing immediate rash, facial/tongue/throat swelling, SOB or lightheadedness with hypotension:yes Has patient had a PCN reaction causing severe rash involving mucus membranes or skin necrosis:no Has patient had a PCN reaction that required hospitalization:no Has patient had a PCN reaction occurring within the last 10 years:No If all of the above answers are "NO", then may proceed with Cephalosporin       Objective:  Physical Exam  Constitutional: She is oriented to person, place, and time. She appears well-developed.  HENT:  Head: Normocephalic and atraumatic.  Right Ear: External ear normal.  Left Ear: External ear normal.  Mouth/Throat: Oropharynx is clear and moist.  Eyes: Conjunctivae and EOM are normal. Pupils are equal, round, and reactive to light.  Neck: Normal range of motion. Neck supple.  Cardiovascular: Normal rate, regular rhythm, normal heart sounds and intact distal pulses.   Abdominal: Soft. Bowel sounds are normal. She exhibits no mass. There is tenderness. There is no rebound and no guarding.  No renal or aortic bruit noted on auscultation.  Musculoskeletal: Normal range of motion.  Neurological: She is alert and oriented to person, place, and time.  Skin: Skin is warm and dry.  Psychiatric: She has a normal mood and affect. Her behavior is normal. Judgment and thought content normal.    . Vitals:   05/26/16 0827  BP: (!)  166/104  Pulse: 90  Resp: 16  Temp: 98 F (36.7 C)     Assessment & Plan:  1. Essential hypertension, uncontrolled  - COMPLETE METABOLIC PANEL WITH GFR-normal Stop taking Ziac 2.3 mg  and 6.25 mg Start amlodipine 10 mg and hydrochlorothiazide 6.25 mg daily.  2. Palpitations, controlled  No recent episodes.  - Magnesium-normal Patient is followed by Dr. Oval Linsey.     3. Depo-Provera contraceptive status Encouraged to consider of methods of contraception due to guidelines recommend to limit the use of depo-provera for no more than 5 years due to musculoskeletal side effects. - VITAMIN D 25 Hydroxy-normal   4. Encounter for special screening examination for cardiovascular disorder - Lipid panel-normal   5. Abdominal pain, chronic, right lower quadrant - CBC with Differential/Platelet-normal - POCT urinalysis dipstick- unremarkable  - POCT Microscopic Urinalysis-unremarkable -Patient is followed by Gastroenterology and has a scheduled gallbladder scan pending.  6. Screening for thyroid disorder - TSH-normal  Carroll Sage. Kenton Kingfisher,  MSN, FNP-C [facility]

## 2016-05-26 NOTE — Patient Instructions (Addendum)
Stop taking bisoprolol- hydrochlorthizade. Start amlodipine 10 mg and hydrochlorthiazide  6.25 today. Check blood pressure daily at the same time everyday and keep a log of all readings. If blood pressure drops below 120/80, reduce amlodipine to 5 mg by breaking tablet in 1/2. Return for follow-up with me in two weeks (8/24).  Please bring blood pressure cuff to visit.  Nice meeting you! I look forwarding to taking care of your healthcare needs.  Carroll Sage. Kenton Kingfisher, MSN, FNP-C [facility]    IF you received an x-ray today, you will receive an invoice from [facility] Radiology. Please contact [facility] Radiology at [phone] with questions or concerns regarding your invoice.   IF you received labwork today, you will receive an invoice from [employer]. Please contact [facility] at [phone] with questions or concerns regarding your invoice.   Our billing staff will not be able to assist you with questions regarding bills from these companies.  You will be contacted with the lab results as soon as they are available. The fastest way to get your results is to activate your My Chart account. Instructions are located on the last page of this paperwork. If you have not heard from [place] regarding the results in 2 weeks, please contact this office.     Amlodipine tablets What is this medicine? AMLODIPINE (am LOE di peen) is a calcium-channel blocker. It affects the amount of calcium found in your heart and muscle cells. This relaxes your blood vessels, which can reduce the amount of work the heart has to do. This medicine is used to lower high blood pressure. It is also used to prevent chest pain. This medicine may be used for other purposes; ask your health care provider or pharmacist if you have questions. What should I tell my health care provider before I take this medicine? They need to know if you have any of these  conditions: -heart problems like heart failure or aortic stenosis -liver disease -an unusual or allergic reaction to amlodipine, other medicines, foods, dyes, or preservatives -pregnant or trying to get pregnant -breast-feeding How should I use this medicine? Take this medicine by mouth with a glass of water. Follow the directions on the prescription label. Take your medicine at regular intervals. Do not take more medicine than directed. Talk to your pediatrician regarding the use of this medicine in children. Special care may be needed. This medicine has been used in children as young as 6. Persons over 7 years old may have a stronger reaction to this medicine and need smaller doses. Overdosage: If you think you have taken too much of this medicine contact a poison control center or emergency room at once. NOTE: This medicine is only for you. Do not share this medicine with others. What if I miss a dose? If you miss a dose, take it as soon as you can. If it is almost time for your next dose, take only that dose. Do not take double or extra doses. What may interact with this medicine? -herbal or dietary supplements -local or general anesthetics -medicines for high blood pressure -medicines for prostate problems -rifampin This list may not describe all possible interactions. Give your health care provider a list of all the medicines, herbs, non-prescription drugs, or dietary supplements you use. Also tell them if you smoke, drink alcohol, or use illegal drugs. Some items may interact with your medicine. What should I watch for while using this medicine? Visit  your doctor or health care professional for regular check ups. Check your blood pressure and pulse rate regularly. Ask your health care professional what your blood pressure and pulse rate should be, and when you should contact him or her. This medicine may make you feel confused, dizzy or lightheaded. Do not drive, use machinery, or do  anything that needs mental alertness until you know how this medicine affects you. To reduce the risk of dizzy or fainting spells, do not sit or stand up quickly, especially if you are an older patient. Avoid alcoholic drinks; they can make you more dizzy. Do not suddenly stop taking amlodipine. Ask your doctor or health care professional how you can gradually reduce the dose. What side effects may I notice from receiving this medicine? Side effects that you should report to your doctor or health care professional as soon as possible: -allergic reactions like skin rash, itching or hives, swelling of the face, lips, or tongue -breathing problems -changes in vision or hearing -chest pain -fast, irregular heartbeat -swelling of legs or ankles Side effects that usually do not require medical attention (report to your doctor or health care professional if they continue or are bothersome): -dry mouth -facial flushing -nausea, vomiting -stomach gas, pain -tired, weak -trouble sleeping This list may not describe all possible side effects. Call your doctor for medical advice about side effects. You may report side effects to FDA at 1-800-FDA-1088. Where should I keep my medicine? Keep out of the reach of children. Store at room temperature between 59 and 86 degrees F (15 and 30 degrees C). Protect from light. Keep container tightly closed. Throw away any unused medicine after the expiration date. NOTE: This sheet is a summary. It may not cover all possible information. If you have questions about this medicine, talk to your doctor, pharmacist, or health care provider.    2016, Elsevier/Gold Standard. (2012-09-01 11:40:58)

## 2016-05-27 ENCOUNTER — Encounter: Payer: Self-pay | Admitting: Family Medicine

## 2016-05-27 LAB — VITAMIN D 25 HYDROXY (VIT D DEFICIENCY, FRACTURES): VIT D 25 HYDROXY: 39 ng/mL (ref 30–100)

## 2016-05-31 ENCOUNTER — Ambulatory Visit (HOSPITAL_COMMUNITY): Payer: BC Managed Care – PPO

## 2016-06-02 ENCOUNTER — Encounter (HOSPITAL_COMMUNITY)
Admission: RE | Admit: 2016-06-02 | Discharge: 2016-06-02 | Disposition: A | Discharge status: Home / Self Care | Payer: BC Managed Care – PPO | Source: Ambulatory Visit | Attending: Gastroenterology | Admitting: Gastroenterology

## 2016-06-02 DIAGNOSIS — K219 Gastro-esophageal reflux disease without esophagitis: Secondary | ICD-10-CM | POA: Diagnosis present

## 2016-06-02 DIAGNOSIS — Z1211 Encounter for screening for malignant neoplasm of colon: Secondary | ICD-10-CM

## 2016-06-02 DIAGNOSIS — R1011 Right upper quadrant pain: Secondary | ICD-10-CM | POA: Diagnosis present

## 2016-06-02 MED ORDER — TECHNETIUM TC 99M MEBROFENIN IV KIT
5.3000 | PACK | Freq: Once | INTRAVENOUS | Status: AC | PRN
  Administered 2016-06-02: 5 via INTRAVENOUS

## 2016-06-16 ENCOUNTER — Encounter: Payer: Self-pay | Admitting: Gastroenterology

## 2016-06-16 ENCOUNTER — Ambulatory Visit (AMBULATORY_SURGERY_CENTER): Payer: BC Managed Care – PPO | Admitting: Gastroenterology

## 2016-06-16 VITALS — BP 140/96 | HR 86 | Temp 99.1°F | Resp 23 | Ht 63.0 in | Wt 181.0 lb

## 2016-06-16 DIAGNOSIS — D124 Benign neoplasm of descending colon: Secondary | ICD-10-CM

## 2016-06-16 DIAGNOSIS — Z1211 Encounter for screening for malignant neoplasm of colon: Secondary | ICD-10-CM | POA: Diagnosis present

## 2016-06-16 DIAGNOSIS — D123 Benign neoplasm of transverse colon: Secondary | ICD-10-CM

## 2016-06-16 DIAGNOSIS — K635 Polyp of colon: Secondary | ICD-10-CM

## 2016-06-16 DIAGNOSIS — D126 Benign neoplasm of colon, unspecified: Secondary | ICD-10-CM | POA: Diagnosis not present

## 2016-06-16 MED ORDER — SODIUM CHLORIDE 0.9 % IV SOLN: 500.0000 mL | INTRAVENOUS | Status: DC

## 2016-06-16 NOTE — Op Note (Signed)
[facility] Patient Name: Megan Lang Procedure Date: 06/16/2016 3:03 PM MRN: [id number] Endoscopist: Remo Lipps P. Havery Moros , MD Age: 45 Referring MD:  Date of Birth: [birth year] Gender: Female Account #: [id number] Procedure:                Colonoscopy Indications:              Screening for malignant neoplasm in the colon, This                            is the patient's first colonoscopy Medicines:                Monitored Anesthesia Care Procedure:                Pre-Anesthesia Assessment:                           - Prior to the procedure, a History and Physical                            was performed, and patient medications and                            allergies were reviewed. The patient's tolerance of                            previous anesthesia was also reviewed. The risks                            and benefits of the procedure and the sedation                            options and risks were discussed with the patient.                            All questions were answered, and informed consent                            was obtained. Prior Anticoagulants: The patient has                            taken no previous anticoagulant or antiplatelet                            agents. ASA Grade Assessment: III - A patient with                            severe systemic disease. After reviewing the risks                            and benefits, the patient was deemed in                            satisfactory condition to undergo the procedure.  After obtaining informed consent, the colonoscope                            was passed under direct vision. Throughout the                            procedure, the patient's blood pressure, pulse, and                            oxygen saturations were monitored continuously. The                            Model CF-HQ190L [id number]) scope was introduced                            through the  anus and advanced to the the cecum,                            identified by appendiceal orifice and ileocecal                            valve. The colonoscopy was technically difficult                            and complex due to spasm of the colon. The patient                            tolerated the procedure well. The quality of the                            bowel preparation was good. The ileocecal valve,                            appendiceal orifice, and rectum were photographed. Scope In: 3:25:49 PM Scope Out: 3:53:34 PM Scope Withdrawal Time: 0 hours 24 minutes 58 seconds  Total Procedure Duration: 0 hours 27 minutes 45 seconds  Findings:                 The perianal and digital rectal examinations were                            normal.                           There was significant spasm in the entire colon                            which led to a prolonged procedure.                           A 5 mm polyp was found in the transverse colon. The                            polyp was sessile. The polyp was removed  with a                            cold snare. Resection and retrieval were complete.                           A 4 mm polyp was found in the descending colon. The                            polyp was sessile. The polyp was removed with a                            cold snare. Resection and retrieval were complete.                           Scattered medium-mouthed diverticula were found in                            the entire colon.                           The exam was otherwise without abnormality on                            direct and retroflexion views. Complications:            No immediate complications. Estimated blood loss:                            Minimal. Estimated Blood Loss:     Estimated blood loss was minimal. Impression:               - Significant colonic spasm.                           - One 5 mm polyp in the transverse colon, removed                             with a cold snare. Resected and retrieved.                           - One 4 mm polyp in the descending colon, removed                            with a cold snare. Resected and retrieved.                           - Diverticulosis in the entire examined colon.                           - The examination was otherwise normal on direct                            and retroflexion views. Recommendation:           - Patient has a contact  number available for                            emergencies. The signs and symptoms of potential                            delayed complications were discussed with the                            patient. Return to normal activities tomorrow.                            Written discharge instructions were provided to the                            patient.                           - Resume previous diet.                           - Continue present medications.                           - No aspirin, ibuprofen, naproxen, or other                            non-steroidal anti-inflammatory drugs for 2 weeks                            after polyp removal.                           - Await pathology results.                           - Repeat colonoscopy is recommended for                            surveillance. The colonoscopy date will be                            determined after pathology results from today's                            exam become available for review. Remo Lipps P. Armbruster, MD 06/16/2016 3:57:36 PM This report has been signed electronically.

## 2016-06-16 NOTE — Progress Notes (Signed)
Called to room to assist during endoscopic procedure.  Patient ID and intended procedure confirmed with present staff. Received instructions for my participation in the procedure from the performing physician.  

## 2016-06-16 NOTE — Progress Notes (Signed)
No problems noted in the recovery room. maw 

## 2016-06-16 NOTE — Patient Instructions (Signed)
YOU HAD AN ENDOSCOPIC PROCEDURE TODAY AT [facility]:   Refer to the procedure report that was given to you for any specific questions about what was found during the examination.  If the procedure report does not answer your questions, please call your gastroenterologist to clarify.  If you requested that your care partner not be given the details of your procedure findings, then the procedure report has been included in a sealed envelope for you to review at your convenience later.  YOU SHOULD EXPECT: Some feelings of bloating in the abdomen. Passage of more gas than usual.  Walking can help get rid of the air that was put into your GI tract during the procedure and reduce the bloating. If you had a lower endoscopy (such as a colonoscopy or flexible sigmoidoscopy) you may notice spotting of blood in your stool or on the toilet paper. If you underwent a bowel prep for your procedure, you may not have a normal bowel movement for a few days.  Please Note:  You might notice some irritation and congestion in your nose or some drainage.  This is from the oxygen used during your procedure.  There is no need for concern and it should clear up in a day or so.  SYMPTOMS TO REPORT IMMEDIATELY:   Following lower endoscopy (colonoscopy or flexible sigmoidoscopy):  Excessive amounts of blood in the stool  Significant tenderness or worsening of abdominal pains  Swelling of the abdomen that is new, acute  Fever of 100F or higher   Following upper endoscopy (EGD)  Vomiting of blood or coffee ground material  New chest pain or pain under the shoulder blades  Painful or persistently difficult swallowing  New shortness of breath  Fever of 100F or higher  Black, tarry-looking stools  For urgent or emergent issues, a gastroenterologist can be reached at any hour by calling [phone].   DIET:  We do recommend a small meal at first, but then you may proceed to your regular diet.  Drink  plenty of fluids but you should avoid alcoholic beverages for 24 hours.  ACTIVITY:  You should plan to take it easy for the rest of today and you should NOT DRIVE or use heavy machinery until tomorrow (because of the sedation medicines used during the test).    FOLLOW UP: Our staff will call the number listed on your records the next business day following your procedure to check on you and address any questions or concerns that you may have regarding the information given to you following your procedure. If we do not reach you, we will leave a message.  However, if you are feeling well and you are not experiencing any problems, there is no need to return our call.  We will assume that you have returned to your regular daily activities without incident.  If any biopsies were taken you will be contacted by phone or by letter within the next 1-3 weeks.  Please call us at [phone] if you have not heard about the biopsies in 3 weeks.    SIGNATURES/CONFIDENTIALITY: You and/or your care partner have signed paperwork which will be entered into your electronic medical record.  These signatures attest to the fact that that the information above on your After Visit Summary has been reviewed and is understood.  Full responsibility of the confidentiality of this discharge information lies with you and/or your care-partner.    Handouts were given to your care partner on polyps  and diverticulosis. No aspirin, aspirin products,  ibuprofen, naproxen, advil, motrin, aleve, or other non-steroidal anti-inflammatory drugs for 14 days after polyp removal. You may resume your other current medications today. Await biopsy results. Please call if any questions or concerns.

## 2016-06-16 NOTE — Progress Notes (Signed)
To pacu vss patent aw reprot to rn 

## 2016-06-17 ENCOUNTER — Telehealth: Payer: Self-pay

## 2016-06-17 NOTE — Telephone Encounter (Signed)
  Follow up Call-  Call back number 06/16/2016 11/07/2015  Post procedure Call Back phone  # [phone]  Permission to leave phone message Yes Yes  Some recent data might be hidden    Patient was called for follow up after her procedure on 06/16/2016. No answer at the number given for follow up phone call. A message was left on the answering machine.

## 2016-06-24 ENCOUNTER — Encounter: Payer: Self-pay | Admitting: Gastroenterology

## 2016-06-29 ENCOUNTER — Ambulatory Visit (INDEPENDENT_AMBULATORY_CARE_PROVIDER_SITE_OTHER): Payer: BC Managed Care – PPO | Admitting: Family Medicine

## 2016-06-29 VITALS — BP 134/86 | HR 105 | Temp 98.0°F | Resp 16 | Ht 62.5 in | Wt 184.6 lb

## 2016-06-29 DIAGNOSIS — I10 Essential (primary) hypertension: Secondary | ICD-10-CM | POA: Diagnosis not present

## 2016-06-29 MED ORDER — AMLODIPINE BESYLATE 5 MG PO TABS: 5.0000 mg | ORAL_TABLET | Freq: Every day | ORAL | 3 refills | Status: DC

## 2016-06-29 NOTE — Patient Instructions (Addendum)
Amlodipine 5 mg daily for hypertension. Follow-up with 6 months  IF you received an x-ray today, you will receive an invoice from [facility] Radiology. Please contact [facility] Radiology at [phone] with questions or concerns regarding your invoice.   IF you received labwork today, you will receive an invoice from [employer]. Please contact [facility] at [phone] with questions or concerns regarding your invoice.   Our billing staff will not be able to assist you with questions regarding bills from these companies.  You will be contacted with the lab results as soon as they are available. The fastest way to get your results is to activate your My Chart account. Instructions are located on the last page of this paperwork. If you have not heard from [place] regarding the results in 2 weeks, please contact this office.

## 2016-06-29 NOTE — Progress Notes (Signed)
Patient ID: Megan Lang, female    DOB: [birth year], 45 y.o.   MRN: [id number]  PCP: Lamar Blinks, MD  Chief Complaint  Patient presents with  . Follow-up    blood pressure    Subjective:   HPI 45 year old female presents for hypertension follow-up.  Patient reports improved blood pressure control.  Denies shortness of breath, chest pain, headache, or dizziness.  She reports noticing since starting amlodipine 5 mg she has experienced increase sensation of warmth and tingling sensation in hands.  She denies any lower extremity swelling.  She also reports that she completed the HIDA scan study and which indicated no active gallbladder disease.  At present she is not having any abdominal symptoms.  Social History   Social History  . Marital status: Married    Spouse name: N/A  . Number of children: 3  . 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 Smoking status: Never Smoker  . Smokeless tobacco: Never Used  . Alcohol use No  . Drug use: No  . 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 Not on file   Social History Narrative   Epworth Sleepiness Scale = 8 (as of 10/28/2015)   Family History  Problem Relation Age of Onset  . Hypertension Mother   . Kidney disease Father   . Liver disease Father     Unknown diagnosis  . Breast cancer Maternal Grandmother   . Hypertension Sister   . Hypertension Brother     Review of Systems SEE HPI  Patient Active Problem List   Diagnosis Date Noted  . GERD (gastroesophageal reflux disease) 01/23/2016  . Dyspnea 10/27/2015  . Costochondritis 08/26/2015  . Obesity 07/01/2015  . Upper airway cough syndrome 06/30/2015  . History of shingles 01/26/2015  . Family history of colon cancer 10/27/2011  . Essential hypertension 10/27/2011     Prior to Admission medications   Medication Sig Start Date End Date  Taking? Authorizing Provider  acetaminophen (TYLENOL) 500 MG tablet Take 500 mg by mouth.   Yes Historical Provider, MD  amLODipine (NORVASC) 10 MG tablet Take 1 tablet (10 mg total) by mouth daily. 05/26/16  Yes Sedalia Muta, FNP  carboxymethylcellulose (REFRESH PLUS) 0.5 % SOLN Place 1 drop into both eyes 3 (three) times daily as needed (for eye irritation).   Yes Historical Provider, MD  fluticasone (FLONASE) 50 MCG/ACT nasal spray Place into the nose.   Yes Historical Provider, MD  hydrochlorothiazide (HYDRODIURIL) 12.5 MG tablet Take 0.5 tablets (6.25 mg total) by mouth daily. 05/26/16  Yes Sedalia Muta, FNP  ibuprofen (ADVIL,MOTRIN) 800 MG tablet  10/28/15  Yes Historical Provider, MD  medroxyPROGESTERone (DEPO-PROVERA) 150 MG/ML injection INJECT INTRAMUSCULARLY AS DIRECTED 03/05/15  Yes Terrance Mass, MD  Multiple Vitamin (THERA) TABS Take by mouth.   Yes Historical Provider, MD  MedroxyPROGESTERone Acetate 150 MG/ML SUSY INJECT INTRAMUSCULARLY AS DIRECTED 02/10/16   Terrance Mass, MD  Olopatadine HCl (PAZEO) 0.7 % SOLN Apply to eye.    Historical Provider, MD  omeprazole (PRILOSEC) 40 MG capsule Take 1 capsule (40 mg total) by mouth once. 05/26/16 05/26/16  Sedalia Muta, FNP  sucralfate (CARAFATE) 1 GM/10ML suspension Take 10 mLs (1 g total) by mouth 4 (four) times daily -  with meals and at bedtime. Patient not taking:  Reported on 06/16/2016 10/31/15   Manus Gunning, MD     Objective:  Physical Exam  Constitutional: She is oriented to person, place, and time. She appears well-developed and well-nourished.  HENT:  Head: Normocephalic and atraumatic.  Right Ear: External ear normal.  Left Ear: External ear normal.  Nose: Nose normal.  Eyes: Conjunctivae and EOM are normal. Pupils are equal, round, and reactive to light.  Neck: Normal range of motion. Neck supple.  Cardiovascular: Normal rate, regular rhythm, normal heart sounds and intact  distal pulses.   Pulmonary/Chest: Effort normal and breath sounds normal.  Musculoskeletal: Normal range of motion.  Neurological: She is alert and oriented to person, place, and time.  Skin: Skin is warm and dry.  Psychiatric: She has a normal mood and affect. Her behavior is normal. Judgment and thought content normal.     Vitals:   06/29/16 1726  BP: 134/86  Pulse: (!) 105  Resp: 16  Temp: 98 F (36.7 C)    Assessment & Plan:  1. Essential hypertension, controlled. Reduce dose amlodipine 10 mg to 5 mg in effort to alleviate side effects. Continue checking and logging blood pressure readings.  Follow-up in 6 months for hypertension re-evaluation.  Carroll Sage. Kenton Kingfisher, MSN, FNP-C [facility]

## 2016-07-01 ENCOUNTER — Encounter: Payer: Self-pay | Admitting: Gynecology

## 2016-07-01 ENCOUNTER — Ambulatory Visit (INDEPENDENT_AMBULATORY_CARE_PROVIDER_SITE_OTHER): Payer: BC Managed Care – PPO | Admitting: Gynecology

## 2016-07-01 VITALS — BP 124/80 | Ht 62.0 in | Wt 184.0 lb

## 2016-07-01 DIAGNOSIS — Z8679 Personal history of other diseases of the circulatory system: Secondary | ICD-10-CM | POA: Diagnosis not present

## 2016-07-01 DIAGNOSIS — I517 Cardiomegaly: Secondary | ICD-10-CM | POA: Insufficient documentation

## 2016-07-01 DIAGNOSIS — Z3009 Encounter for other general counseling and advice on contraception: Secondary | ICD-10-CM

## 2016-07-01 NOTE — Patient Instructions (Signed)
Etonogestrel implant What is this medicine? ETONOGESTREL (et oh noe JES trel) is a contraceptive (birth control) device. It is used to prevent pregnancy. It can be used for up to 3 years. This medicine may be used for other purposes; ask your health care provider or pharmacist if you have questions. What should I tell my health care provider before I take this medicine? They need to know if you have any of these conditions: -abnormal vaginal bleeding -blood vessel disease or blood clots -cancer of the breast, cervix, or liver -depression -diabetes -gallbladder disease -headaches -heart disease or recent heart attack -high blood pressure -high cholesterol -kidney disease -liver disease -renal disease -seizures -tobacco smoker -an unusual or allergic reaction to etonogestrel, other hormones, anesthetics or antiseptics, medicines, foods, dyes, or preservatives -pregnant or trying to get pregnant -breast-feeding How should I use this medicine? This device is inserted just under the skin on the inner side of your upper arm by a health care professional. Talk to your pediatrician regarding the use of this medicine in children. Special care may be needed. Overdosage: If you think you have taken too much of this medicine contact a poison control center or emergency room at once. NOTE: This medicine is only for you. Do not share this medicine with others. What if I miss a dose? This does not apply. What may interact with this medicine? Do not take this medicine with any of the following medications: -amprenavir -bosentan -fosamprenavir This medicine may also interact with the following medications: -barbiturate medicines for inducing sleep or treating seizures -certain medicines for fungal infections like ketoconazole and itraconazole -griseofulvin -medicines to treat seizures like carbamazepine, felbamate, oxcarbazepine, phenytoin,  topiramate -modafinil -phenylbutazone -rifampin -some medicines to treat HIV infection like atazanavir, indinavir, lopinavir, nelfinavir, tipranavir, ritonavir -St. John's wort This list may not describe all possible interactions. Give your health care provider a list of all the medicines, herbs, non-prescription drugs, or dietary supplements you use. Also tell them if you smoke, drink alcohol, or use illegal drugs. Some items may interact with your medicine. What should I watch for while using this medicine? This product does not protect you against HIV infection (AIDS) or other sexually transmitted diseases. You should be able to feel the implant by pressing your fingertips over the skin where it was inserted. Contact your doctor if you cannot feel the implant, and use a non-hormonal birth control method (such as condoms) until your doctor confirms that the implant is in place. If you feel that the implant may have broken or become bent while in your arm, contact your healthcare provider. What side effects may I notice from receiving this medicine? Side effects that you should report to your doctor or health care professional as soon as possible: -allergic reactions like skin rash, itching or hives, swelling of the face, lips, or tongue -breast lumps -changes in emotions or moods -depressed mood -heavy or prolonged menstrual bleeding -pain, irritation, swelling, or bruising at the insertion site -scar at site of insertion -signs of infection at the insertion site such as fever, and skin redness, pain or discharge -signs of pregnancy -signs and symptoms of a blood clot such as breathing problems; changes in vision; chest pain; severe, sudden headache; pain, swelling, warmth in the leg; trouble speaking; sudden numbness or weakness of the face, arm or leg -signs and symptoms of liver injury like dark yellow or brown urine; general ill feeling or flu-like symptoms; light-colored stools; loss of  appetite; nausea; right upper belly   pain; unusually weak or tired; yellowing of the eyes or skin -unusual vaginal bleeding, discharge -signs and symptoms of a stroke like changes in vision; confusion; trouble speaking or understanding; severe headaches; sudden numbness or weakness of the face, arm or leg; trouble walking; dizziness; loss of balance or coordination Side effects that usually do not require medical attention (Report these to your doctor or health care professional if they continue or are bothersome.): -acne -back pain -breast pain -changes in weight -dizziness -general ill feeling or flu-like symptoms -headache -irregular menstrual bleeding -nausea -sore throat -vaginal irritation or inflammation This list may not describe all possible side effects. Call your doctor for medical advice about side effects. You may report side effects to FDA at 1-800-FDA-1088. Where should I keep my medicine? This drug is given in a hospital or clinic and will not be stored at home. NOTE: This sheet is a summary. It may not cover all possible information. If you have questions about this medicine, talk to your doctor, pharmacist, or health care provider.    2016, Elsevier/Gold Standard. (2014-07-19 14:07:06) Levonorgestrel intrauterine device (IUD) What is this medicine? LEVONORGESTREL IUD (LEE voe nor jes trel) is a contraceptive (birth control) device. The device is placed inside the uterus by a healthcare professional. It is used to prevent pregnancy and can also be used to treat heavy bleeding that occurs during your period. Depending on the device, it can be used for 3 to 5 years. This medicine may be used for other purposes; ask your health care provider or pharmacist if you have questions. What should I tell my health care provider before I take this medicine? They need to know if you have any of these conditions: -abnormal Pap smear -cancer of the breast, uterus, or  cervix -diabetes -endometritis -genital or pelvic infection now or in the past -have more than one sexual partner or your partner has more than one partner -heart disease -history of an ectopic or tubal pregnancy -immune system problems -IUD in place -liver disease or tumor -problems with blood clots or take blood-thinners -use intravenous drugs -uterus of unusual shape -vaginal bleeding that has not been explained -an unusual or allergic reaction to levonorgestrel, other hormones, silicone, or polyethylene, medicines, foods, dyes, or preservatives -pregnant or trying to get pregnant -breast-feeding How should I use this medicine? This device is placed inside the uterus by a health care professional. Talk to your pediatrician regarding the use of this medicine in children. Special care may be needed. Overdosage: If you think you have taken too much of this medicine contact a poison control center or emergency room at once. NOTE: This medicine is only for you. Do not share this medicine with others. What if I miss a dose? This does not apply. What may interact with this medicine? Do not take this medicine with any of the following medications: -amprenavir -bosentan -fosamprenavir This medicine may also interact with the following medications: -aprepitant -barbiturate medicines for inducing sleep or treating seizures -bexarotene -griseofulvin -medicines to treat seizures like carbamazepine, ethotoin, felbamate, oxcarbazepine, phenytoin, topiramate -modafinil -pioglitazone -rifabutin -rifampin -rifapentine -some medicines to treat HIV infection like atazanavir, indinavir, lopinavir, nelfinavir, tipranavir, ritonavir -St. John's wort -warfarin This list may not describe all possible interactions. Give your health care provider a list of all the medicines, herbs, non-prescription drugs, or dietary supplements you use. Also tell them if you smoke, drink alcohol, or use illegal  drugs. Some items may interact with your medicine. What should I watch for  while using this medicine? Visit your doctor or health care professional for regular check ups. See your doctor if you or your partner has sexual contact with others, becomes HIV positive, or gets a sexual transmitted disease. This product does not protect you against HIV infection (AIDS) or other sexually transmitted diseases. You can check the placement of the IUD yourself by reaching up to the top of your vagina with clean fingers to feel the threads. Do not pull on the threads. It is a good habit to check placement after each menstrual period. Call your doctor right away if you feel more of the IUD than just the threads or if you cannot feel the threads at all. The IUD may come out by itself. You may become pregnant if the device comes out. If you notice that the IUD has come out use a backup birth control method like condoms and call your health care provider. Using tampons will not change the position of the IUD and are okay to use during your period. What side effects may I notice from receiving this medicine? Side effects that you should report to your doctor or health care professional as soon as possible: -allergic reactions like skin rash, itching or hives, swelling of the face, lips, or tongue -fever, flu-like symptoms -genital sores -high blood pressure -no menstrual period for 6 weeks during use -pain, swelling, warmth in the leg -pelvic pain or tenderness -severe or sudden headache -signs of pregnancy -stomach cramping -sudden shortness of breath -trouble with balance, talking, or walking -unusual vaginal bleeding, discharge -yellowing of the eyes or skin Side effects that usually do not require medical attention (report to your doctor or health care professional if they continue or are bothersome): -acne -breast pain -change in sex drive or performance -changes in weight -cramping, dizziness, or  faintness while the device is being inserted -headache -irregular menstrual bleeding within first 3 to 6 months of use -nausea This list may not describe all possible side effects. Call your doctor for medical advice about side effects. You may report side effects to FDA at 1-800-FDA-1088. Where should I keep my medicine? This does not apply. NOTE: This sheet is a summary. It may not cover all possible information. If you have questions about this medicine, talk to your doctor, pharmacist, or health care provider.    2016, Elsevier/Gold Standard. (2011-11-04 13:54:04)

## 2016-07-01 NOTE — Progress Notes (Signed)
   Patient is a 45 year old that presented to the office today to decrease crease alternative contraceptive options. She has been on the Depo-Provera for several years and is concerned about the potential for bone loss from chronic use.  Patient also had been followed by the cardiothoracic surgeon Dr. Tharon Aquas Trist as a result of a mild fusiform ascending aneurysm that had been noted on CT scan which had been ordered in the ED. 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 Risk of dissection currently is less than 1% per year  Treatment recommendations--blood pressure control is the most important aspect of this patient's risk profile for dissection. This was discussed with the patient she was recommended to check her blood pressure frequently and to keep her blood pressure less than 150/90.  Serial surveillance scans of her ascending aorta are recommended and she will be scheduled for a CT scan with contrast in one year. Symptoms of acute dissection including severe tearing anterior precordial or posterior interscapular pain should be immediately evaluated at closest emergency room.with repeat CT scan."  A repeat CT scan for surveillance was recommended 1 year from previous study which her PCP we'll be following.  Her blood pressure today was 124/80  We went through a detailed discussion of all the different contraceptive options to include the following: Depo-Provera injection Nexplanon ParaGard T380A IUD NuvaRing Mirena IUD Barrier contraception condoms  Because of patient's history of hypertension and aortic aneurysm that the cardiologist monitoring I would recommend that she stay with a progesterone only form of contraception is a Mirena IUD or the Or the nonhormonal IUD such as the [place] IUD. The risks benefits pros and cons and failure rates of all the above contraceptives were  discussed with the patient and literature is information was provided she will read and schedule for next month which she would otherwise be due for her next Depo-Provera injection.  Greater than 50% of the time was spent counseling and coordinating care for this patient on different contraceptive options in light of her cardiac history. Total time of consultation 15 minutes

## 2016-07-02 ENCOUNTER — Telehealth: Payer: Self-pay

## 2016-07-02 NOTE — Telephone Encounter (Signed)
Patient informed that I checked her ins benefits for Nexplanon, Mirena and Paragaurd and all three are covered and $25 copayment then 100%.  No prior authorization required per [name]. Call [phone]  Patient desires Nexplanon. Megan Lang will be calling her to arrange appointment.

## 2016-07-29 ENCOUNTER — Encounter: Payer: Self-pay | Admitting: Gynecology

## 2016-07-29 ENCOUNTER — Ambulatory Visit (INDEPENDENT_AMBULATORY_CARE_PROVIDER_SITE_OTHER): Payer: BC Managed Care – PPO | Admitting: Gynecology

## 2016-07-29 ENCOUNTER — Encounter: Payer: Self-pay | Admitting: Anesthesiology

## 2016-07-29 VITALS — BP 136/88

## 2016-07-29 DIAGNOSIS — Z30017 Encounter for initial prescription of implantable subdermal contraceptive: Secondary | ICD-10-CM | POA: Diagnosis not present

## 2016-07-29 DIAGNOSIS — Z3049 Encounter for surveillance of other contraceptives: Secondary | ICD-10-CM | POA: Diagnosis not present

## 2016-07-29 DIAGNOSIS — Z975 Presence of (intrauterine) contraceptive device: Secondary | ICD-10-CM | POA: Insufficient documentation

## 2016-07-29 NOTE — Patient Instructions (Signed)
Etonogestrel implant What is this medicine? ETONOGESTREL (et oh noe JES trel) is a contraceptive (birth control) device. It is used to prevent pregnancy. It can be used for up to 3 years. This medicine may be used for other purposes; ask your health care provider or pharmacist if you have questions. What should I tell my health care provider before I take this medicine? They need to know if you have any of these conditions: -abnormal vaginal bleeding -blood vessel disease or blood clots -cancer of the breast, cervix, or liver -depression -diabetes -gallbladder disease -headaches -heart disease or recent heart attack -high blood pressure -high cholesterol -kidney disease -liver disease -renal disease -seizures -tobacco smoker -an unusual or allergic reaction to etonogestrel, other hormones, anesthetics or antiseptics, medicines, foods, dyes, or preservatives -pregnant or trying to get pregnant -breast-feeding How should I use this medicine? This device is inserted just under the skin on the inner side of your upper arm by a health care professional. Talk to your pediatrician regarding the use of this medicine in children. Special care may be needed. Overdosage: If you think you have taken too much of this medicine contact a poison control center or emergency room at once. NOTE: This medicine is only for you. Do not share this medicine with others. What if I miss a dose? This does not apply. What may interact with this medicine? Do not take this medicine with any of the following medications: -amprenavir -bosentan -fosamprenavir This medicine may also interact with the following medications: -barbiturate medicines for inducing sleep or treating seizures -certain medicines for fungal infections like ketoconazole and itraconazole -griseofulvin -medicines to treat seizures like carbamazepine, felbamate, oxcarbazepine, phenytoin,  topiramate -modafinil -phenylbutazone -rifampin -some medicines to treat HIV infection like atazanavir, indinavir, lopinavir, nelfinavir, tipranavir, ritonavir -St. John's wort This list may not describe all possible interactions. Give your health care provider a list of all the medicines, herbs, non-prescription drugs, or dietary supplements you use. Also tell them if you smoke, drink alcohol, or use illegal drugs. Some items may interact with your medicine. What should I watch for while using this medicine? This product does not protect you against HIV infection (AIDS) or other sexually transmitted diseases. You should be able to feel the implant by pressing your fingertips over the skin where it was inserted. Contact your doctor if you cannot feel the implant, and use a non-hormonal birth control method (such as condoms) until your doctor confirms that the implant is in place. If you feel that the implant may have broken or become bent while in your arm, contact your healthcare provider. What side effects may I notice from receiving this medicine? Side effects that you should report to your doctor or health care professional as soon as possible: -allergic reactions like skin rash, itching or hives, swelling of the face, lips, or tongue -breast lumps -changes in emotions or moods -depressed mood -heavy or prolonged menstrual bleeding -pain, irritation, swelling, or bruising at the insertion site -scar at site of insertion -signs of infection at the insertion site such as fever, and skin redness, pain or discharge -signs of pregnancy -signs and symptoms of a blood clot such as breathing problems; changes in vision; chest pain; severe, sudden headache; pain, swelling, warmth in the leg; trouble speaking; sudden numbness or weakness of the face, arm or leg -signs and symptoms of liver injury like dark yellow or brown urine; general ill feeling or flu-like symptoms; light-colored stools; loss of  appetite; nausea; right upper belly   pain; unusually weak or tired; yellowing of the eyes or skin -unusual vaginal bleeding, discharge -signs and symptoms of a stroke like changes in vision; confusion; trouble speaking or understanding; severe headaches; sudden numbness or weakness of the face, arm or leg; trouble walking; dizziness; loss of balance or coordination Side effects that usually do not require medical attention (Report these to your doctor or health care professional if they continue or are bothersome.): -acne -back pain -breast pain -changes in weight -dizziness -general ill feeling or flu-like symptoms -headache -irregular menstrual bleeding -nausea -sore throat -vaginal irritation or inflammation This list may not describe all possible side effects. Call your doctor for medical advice about side effects. You may report side effects to FDA at 1-800-FDA-1088. Where should I keep my medicine? This drug is given in a hospital or clinic and will not be stored at home. NOTE: This sheet is a summary. It may not cover all possible information. If you have questions about this medicine, talk to your doctor, pharmacist, or health care provider.    2016, Elsevier/Gold Standard. (2014-07-19 14:07:06)  

## 2016-07-29 NOTE — Progress Notes (Signed)
   Patient is a 45 year old that presented to the office today for placement of the [place]. She was due to receive her Depo-Provera injection but wanted to change forms of contraception. She has been encouraged to take her calcium and vitamin D on a daily basis as well.                                              Nexplanon Procedure Note (insertion)     The patient was laying on her back with her nondominant arm flexed at the elbow and externally rotated. The insertion site was identified as the underside of the nondominant upper right arm approximately 8 cm from the medial epicondyle of the humerus. 2 marks were made with a sterile marker: The first marked the spot where the Nexplanon  implant was to be inserted, and a second, marked a spot a few centimeters proximal to the first marke to guide the direction of the insertion. The area was cleansed with Betadine solution. The area was anesthetized with 1% lidocaine  (1 cc)  at the area the injection site and underneath the skin along the planned insertion tunnel. The preloaded disposable Nexplanon was removed from its sterile casing.  The applicator was held above the needle at the textured surface area. The transparent protector was removed. With a freehand, the skin was stretched around the insertion site with a thumb and index finger. The skin was then punctured with the tip of the needle angled at 30. The Nexplanon applicator was lowered to a horizontal position. While lifting the skin with the tip of the needle the needle was then slid to its full length. The applicator was kept in sitting position with a needle inserted to its full length. The purple slider was unlocked by pushing it slightly downward. The slider was fully moved back until it stopped. This allowed the implant to be in the final subdermal position and the needle was locked inside the body of the applicator. The applicator was then removed. A Steri-Strip was made over the incision  and a Kerlix wrap was placed which patient is to remove tomorrow. No complications patient tolerated procedure well and was released home with instructions.   [facility] [id number]@

## 2016-08-16 ENCOUNTER — Ambulatory Visit (INDEPENDENT_AMBULATORY_CARE_PROVIDER_SITE_OTHER): Payer: BC Managed Care – PPO | Admitting: Family Medicine

## 2016-08-16 VITALS — BP 126/80 | HR 97 | Temp 97.6°F | Resp 17 | Ht 64.5 in | Wt 177.0 lb

## 2016-08-16 DIAGNOSIS — J0110 Acute frontal sinusitis, unspecified: Secondary | ICD-10-CM | POA: Diagnosis not present

## 2016-08-16 MED ORDER — AZITHROMYCIN 250 MG PO TABS: ORAL_TABLET | ORAL | 0 refills | Status: DC

## 2016-08-16 NOTE — Patient Instructions (Addendum)
Continue Claritin daily and saline nasal spray for symptom management. Start Azithromycin Take 2 tabs x 1 dose, then 1 tab every day for x 4 days   IF you received an x-ray today, you will receive an invoice from [facility]. Please contact [facility] at [phone] with questions or concerns regarding your invoice.   IF you received labwork today, you will receive an invoice from [employer]. Please contact [facility] at [phone] with questions or concerns regarding your invoice.   Our billing staff will not be able to assist you with questions regarding bills from these companies.  You will be contacted with the lab results as soon as they are available. The fastest way to get your results is to activate your My Chart account. Instructions are located on the last page of this paperwork. If you have not heard from [place] regarding the results in 2 weeks, please contact this office.     Sinusitis, Adult Sinusitis is redness, soreness, and inflammation of the paranasal sinuses. Paranasal sinuses are air pockets within the bones of your face. They are located beneath your eyes, in the middle of your forehead, and above your eyes. In healthy paranasal sinuses, mucus is able to drain out, and air is able to circulate through them by way of your nose. However, when your paranasal sinuses are inflamed, mucus and air can become trapped. This can allow bacteria and other germs to grow and cause infection. Sinusitis can develop quickly and last only a short time (acute) or continue over a long period (chronic). Sinusitis that lasts for more than 12 weeks is considered chronic. CAUSES Causes of sinusitis include:  Allergies.  Structural abnormalities, such as displacement of the cartilage that separates your nostrils (deviated septum), which can decrease the air flow through your nose and sinuses and affect sinus drainage.  Functional abnormalities, such as  when the small hairs (cilia) that line your sinuses and help remove mucus do not work properly or are not present. SIGNS AND SYMPTOMS Symptoms of acute and chronic sinusitis are the same. The primary symptoms are pain and pressure around the affected sinuses. Other symptoms include:  Upper toothache.  Earache.  Headache.  Bad breath.  Decreased sense of smell and taste.  A cough, which worsens when you are lying flat.  Fatigue.  Fever.  Thick drainage from your nose, which often is green and may contain pus (purulent).  Swelling and warmth over the affected sinuses. DIAGNOSIS Your health care provider will perform a physical exam. During your exam, your health care provider may perform any of the following to help determine if you have acute sinusitis or chronic sinusitis:  Look in your nose for signs of abnormal growths in your nostrils (nasal polyps).  Tap over the affected sinus to check for signs of infection.  View the inside of your sinuses using an imaging device that has a light attached (endoscope). If your health care provider suspects that you have chronic sinusitis, one or more of the following tests may be recommended:  Allergy tests.  Nasal culture. A sample of mucus is taken from your nose, sent to a lab, and screened for bacteria.  Nasal cytology. A sample of mucus is taken from your nose and examined by your health care provider to determine if your sinusitis is related to an allergy. TREATMENT Most cases of acute sinusitis are related to a viral infection and will resolve on their own within 10 days. Sometimes, medicines are prescribed  to help relieve symptoms of both acute and chronic sinusitis. These may include pain medicines, decongestants, nasal steroid sprays, or saline sprays. However, for sinusitis related to a bacterial infection, your health care provider will prescribe antibiotic medicines. These are medicines that will help kill the bacteria  causing the infection. Rarely, sinusitis is caused by a fungal infection. In these cases, your health care provider will prescribe antifungal medicine. For some cases of chronic sinusitis, surgery is needed. Generally, these are cases in which sinusitis recurs more than 3 times per year, despite other treatments. HOME CARE INSTRUCTIONS  Drink plenty of water. Water helps thin the mucus so your sinuses can drain more easily.  Use a humidifier.  Inhale steam 3-4 times a day (for example, sit in the bathroom with the shower running).  Apply a warm, moist washcloth to your face 3-4 times a day, or as directed by your health care provider.  Use saline nasal sprays to help moisten and clean your sinuses.  Take medicines only as directed by your health care provider.  If you were prescribed either an antibiotic or antifungal medicine, finish it all even if you start to feel better. SEEK IMMEDIATE MEDICAL CARE IF:  You have increasing pain or severe headaches.  You have nausea, vomiting, or drowsiness.  You have swelling around your face.  You have vision problems.  You have a stiff neck.  You have difficulty breathing.   This information is not intended to replace advice given to you by your health care provider. Make sure you discuss any questions you have with your health care provider.   Document Released: 10/04/2005 Document Revised: 10/25/2014 Document Reviewed: 10/19/2011 Elsevier Interactive Patient Education [employer].

## 2016-08-16 NOTE — Progress Notes (Addendum)
Patient ID: Megan Lang, female    DOB: June 02, 1971, 45 y.o.   MRN: [id number]  PCP: Molli Barrows, FNP  Chief Complaint  Patient presents with  . Cough  . URI    Subjective:   HPI 45 year old female presents for evaluation of sinus congestion times 6 weeks. Patient is established here at [facility]. She reports symptoms that initially began as a headache and sore throat.  Reports ongoing tendenress behind the eyes bilaterally. Sore throat resolved then she began to experience a 6 week course of sinus congestion and nasal drainage. Two weeks ago she reports a new onset cough that is productive of clear and yellow mucus. She has taken benadryl and Mucinex with mild relief of symptoms. Denies recent fever,wheezing, shortness of breath,  nausea, or vomiting.  Review of Systems See HPI    Patient Active Problem List   Diagnosis Date Noted  . Nexplanon in place 07/29/2016  . Cardiomegaly 07/01/2016  . History of aortic aneurysm 07/01/2016  . GERD (gastroesophageal reflux disease) 01/23/2016  . Dyspnea 10/27/2015  . Costochondritis 08/26/2015  . Obesity 07/01/2015  . Upper airway cough syndrome 06/30/2015  . History of shingles 01/26/2015  . Family history of colon cancer 10/27/2011  . Essential hypertension 10/27/2011     Prior to Admission medications   Medication Sig Start Date End Date Taking? Authorizing Provider  acetaminophen (TYLENOL) 500 MG tablet Take 500 mg by mouth.   Yes Historical Provider, MD  amLODipine (NORVASC) 5 MG tablet Take 1 tablet (5 mg total) by mouth daily. 06/29/16  Yes Sedalia Muta, FNP  carboxymethylcellulose (REFRESH PLUS) 0.5 % SOLN Place 1 drop into both eyes 3 (three) times daily as needed (for eye irritation).   Yes Historical Provider, MD  fluticasone (FLONASE) 50 MCG/ACT nasal spray Place into the nose.   Yes Historical Provider, MD  hydrochlorothiazide (HYDRODIURIL) 12.5 MG tablet Take 0.5 tablets (6.25 mg total) by mouth  daily. 05/26/16  Yes Sedalia Muta, FNP  ibuprofen (ADVIL,MOTRIN) 800 MG tablet  10/28/15  Yes Historical Provider, MD  Multiple Vitamin (THERA) TABS Take by mouth.   Yes Historical Provider, MD  Olopatadine HCl (PAZEO) 0.7 % SOLN Apply to eye.   Yes Historical Provider, MD  omeprazole (PRILOSEC) 40 MG capsule Take 1 capsule (40 mg total) by mouth once. 05/26/16 08/16/16 Yes Sedalia Muta, FNP  medroxyPROGESTERone (DEPO-PROVERA) 150 MG/ML injection INJECT INTRAMUSCULARLY AS DIRECTED Patient not taking: Reported on 08/16/2016 03/05/15   Terrance Mass, MD     Allergies  Allergen Reactions  . Clindamycin Diarrhea and Other (See Comments)    GI upset  . Codeine Other (See Comments)    TACHYCARDIA   . Levofloxacin Other (See Comments)    other  . Metronidazole Other (See Comments)    Other  . Minocin [Minocycline Hcl] Other (See Comments)    Stomach Pains  . Moxifloxacin Swelling    other  . Penicillins Rash    Has patient had a PCN reaction causing immediate rash, facial/tongue/throat swelling, SOB or lightheadedness with hypotension:yes Has patient had a PCN reaction causing severe rash involving mucus membranes or skin necrosis:no Has patient had a PCN reaction that required hospitalization:no Has patient had a PCN reaction occurring within the last 10 years:No If all of the above answers are "NO", then may proceed with Cephalosporin       Objective:  Physical Exam  Constitutional: She is oriented to person, place, and time. She appears well-developed  and well-nourished.  HENT:  Head: Normocephalic and atraumatic.  Right Ear: External ear normal.  Left Ear: External ear normal.  Nose: Mucosal edema and rhinorrhea present.  Mouth/Throat: Oropharynx is clear and moist. No oropharyngeal exudate.  Eyes: Conjunctivae and EOM are normal. Pupils are equal, round, and reactive to light.  Neck: Normal range of motion. Neck supple.  Cardiovascular: Normal rate,  regular rhythm, normal heart sounds and intact distal pulses.   Pulmonary/Chest: Effort normal and breath sounds normal.  Musculoskeletal: Normal range of motion.  Neurological: She is alert and oriented to person, place, and time.  Skin: Skin is warm and dry.  Psychiatric: She has a normal mood and affect. Judgment and thought content normal.    Vitals:   08/16/16 1041  BP: 126/80  Pulse: 97  Resp: 17  Temp: 97.6 F (36.4 C)      Assessment & Plan:  1. Acute non-recurrent frontal sinusitis Plan: . azithromycin (ZITHROMAX) 250 MG tablet    Sig: Take 2 tabs PO x 1 dose, then 1 tab PO QD x 4 days    Dispense:  6 tablet   Follow-up if symptoms due not improve.  Carroll Sage. Kenton Kingfisher, MSN, FNP-C [facility]

## 2016-08-18 ENCOUNTER — Telehealth: Payer: Self-pay | Admitting: *Deleted

## 2016-08-18 NOTE — Telephone Encounter (Signed)
Pt had Nexplanon placed on 07/29/16, pt said she noticed a small lump under skin (it is not the implant she is feeling)  where the Nexplanon was inserted, no painful, only noticed with touch. Pt asked if this normal? She read possible keloids could form. Please advise

## 2016-08-18 NOTE — Telephone Encounter (Signed)
Pt informed with the below note. 

## 2016-08-18 NOTE — Telephone Encounter (Signed)
We cannot tell without looking at it. She can come by the office this week or next week and we will be more than glad to look at it

## 2016-08-30 ENCOUNTER — Ambulatory Visit (INDEPENDENT_AMBULATORY_CARE_PROVIDER_SITE_OTHER): Payer: BC Managed Care – PPO

## 2016-08-30 ENCOUNTER — Ambulatory Visit (INDEPENDENT_AMBULATORY_CARE_PROVIDER_SITE_OTHER): Payer: BC Managed Care – PPO | Admitting: Family Medicine

## 2016-08-30 VITALS — BP 124/80 | HR 113 | Temp 99.4°F | Resp 16 | Ht 62.5 in | Wt 174.0 lb

## 2016-08-30 DIAGNOSIS — R05 Cough: Secondary | ICD-10-CM

## 2016-08-30 DIAGNOSIS — R059 Cough, unspecified: Secondary | ICD-10-CM

## 2016-08-30 LAB — POCT CBC
Granulocyte percent: 87.5 %G — AB (ref 37–80)
HCT, POC: 36.8 % — AB (ref 37.7–47.9)
HEMOGLOBIN: 13.5 g/dL (ref 12.2–16.2)
Lymph, poc: 1.3 (ref 0.6–3.4)
MCH: 31.2 pg (ref 27–31.2)
MCHC: 36.7 g/dL — AB (ref 31.8–35.4)
MCV: 85 fL (ref 80–97)
MID (cbc): 0.5 (ref 0–0.9)
MPV: 7 fL (ref 0–99.8)
POC GRANULOCYTE: 12.9 — AB (ref 2–6.9)
POC LYMPH PERCENT: 8.9 %L — AB (ref 10–50)
POC MID %: 3.6 % (ref 0–12)
Platelet Count, POC: 239 10*3/uL (ref 142–424)
RBC: 4.33 M/uL (ref 4.04–5.48)
RDW, POC: 12.6 %
WBC: 14.7 10*3/uL — AB (ref 4.6–10.2)

## 2016-08-30 MED ORDER — AZITHROMYCIN 250 MG PO TABS: ORAL_TABLET | ORAL | 0 refills | Status: DC

## 2016-08-30 NOTE — Patient Instructions (Addendum)
  Thank you for coming in,   Please follow up with [place] if you don't have any improvement of your symptoms.     Please feel free to call with any questions or concerns at any time, at [phone]. --Dr. Raeford Razor    IF you received an x-ray today, you will receive an invoice from [facility]. Please contact [facility] at [phone] with questions or concerns regarding your invoice.   IF you received labwork today, you will receive an invoice from [employer]. Please contact [facility] at [phone] with questions or concerns regarding your invoice.   Our billing staff will not be able to assist you with questions regarding bills from these companies.  You will be contacted with the lab results as soon as they are available. The fastest way to get your results is to activate your My Chart account. Instructions are located on the last page of this paperwork. If you have not heard from [place] regarding the results in 2 weeks, please contact this office.

## 2016-08-30 NOTE — Progress Notes (Signed)
Patient discussed and examined with Dr. Raeford Razor. Agree with findings, assessment and plan of care per his note. Concerning for possible early CAP with sx's and leukocytosis, although CXR reassuring currently. Repeat eval in 48 hrs.   Dg Chest 2 View  Result Date: 08/30/2016 CLINICAL DATA:  Pt c/o cough "for several months". Non smoker. HTN, hx of asthma. EXAM: CHEST  2 VIEW COMPARISON:  10/18/2015 FINDINGS: The heart size and mediastinal contours are within normal limits. Both lungs are clear. No pleural effusion or pneumothorax. The visualized skeletal structures are unremarkable. IMPRESSION: No active cardiopulmonary disease. Electronically Signed   By: Lajean Manes M.D.   On: 08/30/2016 09:02

## 2016-08-30 NOTE — Assessment & Plan Note (Signed)
Concern that she may have an early pneumonia. She has an underlying upper airway cough syndrome. She has received the flu vaccine. May be a viral illness. White count elevated with tachycardia and fever last night - start azithromycin for 5 day course  - she will f/u with Dr. Carlota Raspberry on Wednesday to monitor for improvement.

## 2016-08-30 NOTE — Progress Notes (Signed)
   Subjective:    Patient ID: Megan Lang, female    DOB: 1971/05/15, 45 y.o.   MRN: [id number]  Chief Complaint  Patient presents with  . Cough    on/off - PRODUCTIVE with yellow and clear mucus since 08/29/2016  . Shortness of Breath    yesterday    PCP: Molli Barrows, FNP  HPI  This is a 45 y.o. female who is presenting with cough. She has history of bronchitis. It has been intermittent for two months. She had body aches, chills, and fever all day yesterday. She works around [employer], dispensing. Having nasal discharge and watery eyes. The cough is all day. She doesn't cough as much at night. Cough is productive but non bloody. She has received the flu vaccine.  Reports she was tested last year for asthma and was told she doesn't have it. The flu like symptoms are new. She reports the cough has improved some. Has not taken any ibuprofen or tylenol this morning.   ROS: No unexpected weight loss, swelling, instability, muscle pain, numbness/tingling, redness, otherwise see HPI    Review of Systems  PMH: HTN, obesity, upper airway cough syndrome PShx: septoplasty PSx: no tobacco or alcohol use  FHx: HTN    Objective:   Physical Exam BP 124/80 (BP Location: Left Arm, Patient Position: Sitting, Cuff Size: Large)   Pulse (!) 113   Temp 99.4 F (37.4 C) (Oral)   Resp 16   Ht 5' 2.5" (1.588 m)   Wt 174 lb (78.9 kg)   LMP 07/29/2016 (Approximate)   SpO2 100%   BMI 31.32 kg/m  Gen: NAD, alert, cooperative with exam, well-appearing HEENT: NCAT, EOMI, clear conjunctiva, oropharynx clear, enlarged turbinates. Greater on right than left, TM clear and intact b/l, no cervical LAD, no frontal or maxillary sinus tenderness.  CV: tachycardia, regular rhythm, good S1/S2, no murmur, no edema, capillary refill brisk  Resp: CTABL, no wheezes, non-labored, no crackles  Abd: SNTND, BS present, no guarding or organomegaly Skin: no rashes, normal turgor  Neuro: no gross deficits.  Psych: alert and  oriented      Assessment & Plan:   Cough Concern that she may have an early pneumonia. She has an underlying upper airway cough syndrome. She has received the flu vaccine. May be a viral illness. White count elevated with tachycardia and fever last night - start azithromycin for 5 day course  - she will f/u with Dr. Carlota Raspberry on Wednesday to monitor for improvement.

## 2016-09-01 ENCOUNTER — Ambulatory Visit (INDEPENDENT_AMBULATORY_CARE_PROVIDER_SITE_OTHER): Payer: BC Managed Care – PPO | Admitting: Family Medicine

## 2016-09-01 VITALS — BP 130/80 | HR 107 | Temp 98.0°F | Resp 16 | Ht 63.0 in | Wt 173.0 lb

## 2016-09-01 DIAGNOSIS — R05 Cough: Secondary | ICD-10-CM | POA: Diagnosis not present

## 2016-09-01 DIAGNOSIS — R059 Cough, unspecified: Secondary | ICD-10-CM

## 2016-09-01 DIAGNOSIS — R000 Tachycardia, unspecified: Secondary | ICD-10-CM

## 2016-09-01 DIAGNOSIS — J189 Pneumonia, unspecified organism: Secondary | ICD-10-CM | POA: Diagnosis not present

## 2016-09-01 NOTE — Progress Notes (Signed)
Subjective:  This chart was scribed for Megan Lang, by [name], at [facility].  This patient was seen in room 1 and the patient's care was started at 8:52 AM.    Patient ID: Megan Lang, female    DOB: 1971-09-04, 45 y.o.   MRN: [id number] Chief Complaint  Patient presents with  . Follow-up    x 2 months, cough     HPI HPI Comments: Megan Lang is a 45 y.o. female who presents to the [facility] for a follow up.  Patient was seen by Dr. Raeford Razor 2 days ago (08/30/16) with intermittent bronchitis symptoms for two months but worsening the day prior. Possible early community acquired pneumonia with underlying upper airway cough syndrome. She has a elevated wbc count of 14.7 with left shift but no acute infiltrate on chest xray.   Patient states that she now feels "much better" compared to her last visit.  She is no longer coughing as she was previously. She does note of having two episodes of a productive cough she had "some streaks" of blood.  She did feel warm the day of her initial visit but currently denies a fever/chills. Patient has associated symptoms of congestion, rhinorrhea and ear pain/popping(intermittently) which she has been taking benadryl for.  She has also been using saline nasal spray.  Patient denies taking any decongestants. She has been compliant with her azithromycin and is currently on her third day of medication.  She has not been using  mucin ex since starting her azithromycin.       Patient Active Problem List   Diagnosis Date Noted  . Cough 08/30/2016  . Nexplanon in place 07/29/2016  . Cardiomegaly 07/01/2016  . History of aortic aneurysm 07/01/2016  . GERD (gastroesophageal reflux disease) 01/23/2016  . Dyspnea 10/27/2015  . Costochondritis 08/26/2015  . Obesity 07/01/2015  . Upper airway cough syndrome 06/30/2015  . History of shingles 01/26/2015  . Family history of colon cancer 10/27/2011   . Essential hypertension 10/27/2011   Past Medical History:  Diagnosis Date  . Acid reflux   . Allergy   . Aortic aneurysm ([place])   . Asthma   . Cardiomegaly   . Carpal tunnel syndrome   . Gastric polyp   . GERD (gastroesophageal reflux disease) 01/23/2016  . Hiatal hernia   . Hypertension   . Normal spontaneous vaginal delivery    3   Past Surgical History:  Procedure Laterality Date  . CARPAL TUNNEL RELEASE  2009  . CARPAL TUNNEL RELEASE Left   . COLONOSCOPY    . NASAL SINUS SURGERY     DEC 15,2016   Allergies  Allergen Reactions  . Clindamycin Diarrhea and Other (See Comments)    GI upset  . Codeine Other (See Comments)    TACHYCARDIA   . Levofloxacin Other (See Comments)    other  . Metronidazole Other (See Comments)    Other  . Minocin [Minocycline Hcl] Other (See Comments)    Stomach Pains  . Moxifloxacin Swelling    other  . Penicillins Rash    Has patient had a PCN reaction causing immediate rash, facial/tongue/throat swelling, SOB or lightheadedness with hypotension:yes Has patient had a PCN reaction causing severe rash involving mucus membranes or skin necrosis:no Has patient had a PCN reaction that required hospitalization:no Has patient had a PCN reaction occurring within the last 10 years:No If all of the above answers are "NO", then  may proceed with Cephalosporin   Prior to Admission medications   Medication Sig Start Date End Date Taking? Authorizing Provider  acetaminophen (TYLENOL) 500 MG tablet Take 500 mg by mouth.    Historical Provider, Lang  amLODipine (NORVASC) 5 MG tablet Take 1 tablet (5 mg total) by mouth daily. 06/29/16   Sedalia Muta, FNP  azithromycin (ZITHROMAX) 250 MG tablet Take 500 mg on the first day and then 250 mg for 4 more days. For a total of 5 days. 08/30/16   Rosemarie Ax, Lang  carboxymethylcellulose (REFRESH PLUS) 0.5 % SOLN Place 1 drop into both eyes 3 (three) times daily as needed (for eye irritation).     Historical Provider, Lang  fluticasone (FLONASE) 50 MCG/ACT nasal spray Place into the nose.    Historical Provider, Lang  hydrochlorothiazide (HYDRODIURIL) 12.5 MG tablet Take 0.5 tablets (6.25 mg total) by mouth daily. 05/26/16   Sedalia Muta, FNP  ibuprofen (ADVIL,MOTRIN) 800 MG tablet  10/28/15   Historical Provider, Lang  medroxyPROGESTERone (DEPO-PROVERA) 150 MG/ML injection INJECT INTRAMUSCULARLY AS DIRECTED Patient not taking: Reported on 08/30/2016 03/05/15   Terrance Mass, Lang  Multiple Vitamin (THERA) TABS Take by mouth.    Historical Provider, Lang  Olopatadine HCl (PAZEO) 0.7 % SOLN Apply to eye.    Historical Provider, Lang  omeprazole (PRILOSEC) 40 MG capsule Take 1 capsule (40 mg total) by mouth once. 05/26/16 08/16/16  Sedalia Muta, FNP   Social History   Social History  . Marital status: Married    Spouse name: N/A  . Number of children: 3  . 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 Smoking status: Never Smoker  . Smokeless tobacco: Never Used  . Alcohol use No  . Drug use: No  . 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 Not on file   Social History Narrative   Epworth Sleepiness Scale = 8 (as of 10/28/2015)      Review of Systems  Constitutional: Negative for chills and fever.  HENT: Positive for congestion, ear pain and rhinorrhea.   Eyes: Negative for pain, redness and itching.  Respiratory: Positive for cough. Negative for choking.   Gastrointestinal: Negative for nausea and vomiting.  Musculoskeletal: Negative for neck pain and neck stiffness.  Neurological: Negative for syncope and speech difficulty.       Objective:   Physical Exam  Constitutional: She is oriented to person, place, and time. She appears well-developed and well-nourished. No distress.  HENT:  Head: Normocephalic and atraumatic.  Right Ear: Hearing,  tympanic membrane, external ear and ear canal normal.  Left Ear: Hearing, tympanic membrane, external ear and ear canal normal.  Nose: Nose normal.  Mouth/Throat: Oropharynx is clear and moist. No oropharyngeal exudate.  TMs are pearly gray  Moist oral mucosa.   Eyes: Conjunctivae and EOM are normal. Pupils are equal, round, and reactive to light.  Cardiovascular: Normal rate, regular rhythm, normal heart sounds and intact distal pulses.   No murmur heard. Tachycardic but regular rhythm.   Pulmonary/Chest: Effort normal and breath sounds normal. No respiratory distress. She has no wheezes. She has no rhonchi.  Neurological: She is alert and oriented to person, place, and time.  Skin: Skin is warm and dry. No rash noted.  Psychiatric: She has a normal mood and affect. Her  behavior is normal.  Vitals reviewed.   Vitals:   09/01/16 0840  BP: 130/80  Pulse: (!) 107  Resp: 16  Temp: 98 F (36.7 C)  TempSrc: Oral  SpO2: 99%  Weight: 173 lb (78.5 kg)  Height: 5\' 3"  (1.6 m)         Assessment & Plan:  MIDDIE HAFLER is a 45 y.o. female Cough  Community acquired pneumonia, unspecified laterality  Tachycardia  Improving cough, suspected early CAP last visit with leukocytosis, now afebrile and O2 sat reassuring. Continue azithromycin, Mucinex over-the-counter, saline nasal spray. Recheck 6 days, sooner if worse. RTC precautions.  Tachycardia likely related to illness, previous normal thyroid test noted few months ago, RTC precautions if symptomatic  No orders of the defined types were placed in this encounter.  Patient Instructions    Oxygen level and temperature look okay today. Your exam is reassuring and you appear to be improving. I did not repeat blood work or x-ray today, but I would like you to follow-up with me in 6 days to make sure things are continuing to improve. If you have any shortness of breath, fevers, or any worsening symptoms sooner, return for recheck  sooner. You can continue Mucinex over-the-counter, increase saline or salt water nasal spray to 4-5 times per day as needed, drink plenty fluids and rest.  Return to the clinic or go to the nearest emergency room if any of your symptoms worsen or new symptoms occur.      IF you received an x-ray today, you will receive an invoice from [facility]. Please contact [facility] at [phone] with questions or concerns regarding your invoice.   IF you received labwork today, you will receive an invoice from [employer]. Please contact [facility] at [phone] with questions or concerns regarding your invoice.   Our billing staff will not be able to assist you with questions regarding bills from these companies.  You will be contacted with the lab results as soon as they are available. The fastest way to get your results is to activate your My Chart account. Instructions are located on the last page of this paperwork. If you have not heard from [place] regarding the results in 2 weeks, please contact this office.        I personally performed the services described in this documentation, which was scribed in my presence. The recorded information has been reviewed and considered, and addended by me as needed.   Signed,   Megan Ray, Lang [facility].  09/01/16 9:08 AM

## 2016-09-01 NOTE — Patient Instructions (Signed)
  Oxygen level and temperature look okay today. Your exam is reassuring and you appear to be improving. I did not repeat blood work or x-ray today, but I would like you to follow-up with me in 6 days to make sure things are continuing to improve. If you have any shortness of breath, fevers, or any worsening symptoms sooner, return for recheck sooner. You can continue Mucinex over-the-counter, increase saline or salt water nasal spray to 4-5 times per day as needed, drink plenty fluids and rest.  Return to the clinic or go to the nearest emergency room if any of your symptoms worsen or new symptoms occur.      IF you received an x-ray today, you will receive an invoice from [facility]. Please contact [facility] at [phone] with questions or concerns regarding your invoice.   IF you received labwork today, you will receive an invoice from [employer]. Please contact [facility] at [phone] with questions or concerns regarding your invoice.   Our billing staff will not be able to assist you with questions regarding bills from these companies.  You will be contacted with the lab results as soon as they are available. The fastest way to get your results is to activate your My Chart account. Instructions are located on the last page of this paperwork. If you have not heard from [place] regarding the results in 2 weeks, please contact this office.

## 2016-09-02 ENCOUNTER — Ambulatory Visit (INDEPENDENT_AMBULATORY_CARE_PROVIDER_SITE_OTHER): Payer: BC Managed Care – PPO | Admitting: Family Medicine

## 2016-09-02 VITALS — BP 126/84 | HR 109 | Temp 98.9°F | Resp 18 | Ht 63.0 in | Wt 172.0 lb

## 2016-09-02 DIAGNOSIS — J189 Pneumonia, unspecified organism: Secondary | ICD-10-CM

## 2016-09-02 LAB — POCT CBC
Granulocyte percent: 71.1 %G (ref 37–80)
HCT, POC: 37.6 % — AB (ref 37.7–47.9)
Hemoglobin: 13.1 g/dL (ref 12.2–16.2)
Lymph, poc: 1.5 (ref 0.6–3.4)
MCH: 30.5 pg (ref 27–31.2)
MCHC: 34.9 g/dL (ref 31.8–35.4)
MCV: 87.4 fL (ref 80–97)
MID (CBC): 0.4 (ref 0–0.9)
MPV: 6.8 fL (ref 0–99.8)
POC Granulocyte: 4.8 (ref 2–6.9)
POC LYMPH PERCENT: 22.4 %L (ref 10–50)
POC MID %: 6.5 % (ref 0–12)
Platelet Count, POC: 290 10*3/uL (ref 142–424)
RBC: 4.3 M/uL (ref 4.04–5.48)
RDW, POC: 12.7 %
WBC: 6.7 10*3/uL (ref 4.6–10.2)

## 2016-09-02 MED ORDER — BENZONATATE 100 MG PO CAPS: 200.0000 mg | ORAL_CAPSULE | Freq: Three times a day (TID) | ORAL | 0 refills | Status: DC | PRN

## 2016-09-02 NOTE — Patient Instructions (Addendum)
Complete all antibiotics. Your blood work has completely normalized.  For cough, start over the counter Delsym and take as directed.  I have added Benzonatate cough suppressant tablets. Take 100-200 mg up to 3 times daily for cough.  I'm writing you out of work until 09/07/2016. If you need longer, please call to let me know.  Get ample rest and increase fluid intake.  Return in 6 weeks for a repeat check x-ray.  IF you received an x-ray today, you will receive an invoice from [facility]. Please contact [facility] Radiology at [phone] with questions or concerns regarding your invoice.   IF you received labwork today, you will receive an invoice from [employer]. Please contact [facility] at [phone] with questions or concerns regarding your invoice.   Our billing staff will not be able to assist you with questions regarding bills from these companies.  You will be contacted with the lab results as soon as they are available. The fastest way to get your results is to activate your My Chart account. Instructions are located on the last page of this paperwork. If you have not heard from [place] regarding the results in 2 weeks, please contact this office.    Community-Acquired Pneumonia, Adult Introduction Pneumonia is an infection of the lungs. One type of pneumonia can happen while a person is in a hospital. A different type can happen when a person is not in a hospital (community-acquired pneumonia). It is easy for this kind to spread from person to person. It can spread to you if you breathe near an infected person who coughs or sneezes. Some symptoms include:  A dry cough.  A wet (productive) cough.  Fever.  Sweating.  Chest pain. Follow these instructions at home:  Take over-the-counter and prescription medicines only as told by your doctor.  Only take cough medicine if you are losing sleep.  If you were prescribed an antibiotic  medicine, take it as told by your doctor. Do not stop taking the antibiotic even if you start to feel better.  Sleep with your head and neck raised (elevated). You can do this by putting a few pillows under your head, or you can sleep in a recliner.  Do not use tobacco products. These include cigarettes, chewing tobacco, and e-cigarettes. If you need help quitting, ask your doctor.  Drink enough water to keep your pee (urine) clear or pale yellow. A shot (vaccine) can help prevent pneumonia. Shots are often suggested for:  People older than 45 years of age.  People older than 45 years of age:  Who are having cancer treatment.  Who have long-term (chronic) lung disease.  Who have problems with their body's defense system (immune system). You may also prevent pneumonia if you take these actions:  Get the flu (influenza) shot every year.  Go to the dentist as often as told.  Wash your hands often. If soap and water are not available, use hand sanitizer. Contact a doctor if:  You have a fever.  You lose sleep because your cough medicine does not help. Get help right away if:  You are short of breath and it gets worse.  You have more chest pain.  Your sickness gets worse. This is very serious if:  You are an older adult.  Your body's defense system is weak.  You cough up blood. This information is not intended to replace advice given to you by your health care provider. Make sure you discuss any questions  you have with your health care provider. Document Released: 03/22/2008 Document Revised: 03/11/2016 Document Reviewed: 01/29/2015  2017 Elsevier

## 2016-09-02 NOTE — Progress Notes (Signed)
Patient ID: Megan Lang, female    DOB: 1971-03-03, 45 y.o.   MRN: [id number]  PCP: Molli Barrows, FNP  Chief Complaint  Patient presents with  . Chills  . Generalized Body Aches  . Cough  . Fever    Subjective:   HPI 45 years old female presents for evaluation of lingering cough, chills, body aches and fever times two weeks. Patient has been seen in office over three times since 08/16/16 with similar complaint. Most recently she was evaluated and treated in office 08/30/16 for cough, leukocytosis, and tachycardia and treated for possible pneumonia. She was seen again on 09/02/16 and advised to continue (azithromyicin) antibiotic in combination with Mucinex and to return in 6 days for follow-up. She presents today, one day after her prior follow-up visit, complaining of subjective fever, chills, cough. Reports feeling as though both ears are congested. Reports 2 nights ago coughing up mucus mixed with streaks of blood. Reports shortness of breath and wheezing with coughing only.  Social History   Social History  . Marital status: Married    Spouse name: N/A  . Number of children: 3  . 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 Smoking status: Never Smoker  . Smokeless tobacco: Never Used  . Alcohol use No  . Drug use: No  . 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 Not on file   Social History Narrative   Epworth Sleepiness Scale = 8 (as of 10/28/2015)   Family History  Problem Relation Age of Onset  . Hypertension Mother   . Kidney disease Father   . Liver disease Father     Unknown diagnosis  . Breast cancer Maternal Grandmother   . Hypertension Sister   . Hypertension Brother     Review of Systems See HPI  Patient Active Problem List   Diagnosis Date Noted  . Cough 08/30/2016  . Nexplanon in place 07/29/2016  . Cardiomegaly 07/01/2016   . History of aortic aneurysm 07/01/2016  . GERD (gastroesophageal reflux disease) 01/23/2016  . Dyspnea 10/27/2015  . Costochondritis 08/26/2015  . Obesity 07/01/2015  . Upper airway cough syndrome 06/30/2015  . History of shingles 01/26/2015  . Family history of colon cancer 10/27/2011  . Essential hypertension 10/27/2011     Prior to Admission medications   Medication Sig Start Date End Date Taking? Authorizing Provider  acetaminophen (TYLENOL) 500 MG tablet Take 500 mg by mouth.   Yes Historical Provider, MD  amLODipine (NORVASC) 5 MG tablet Take 1 tablet (5 mg total) by mouth daily. 06/29/16  Yes Sedalia Muta, FNP  azithromycin (ZITHROMAX) 250 MG tablet Take 500 mg on the first day and then 250 mg for 4 more days. For a total of 5 days. 08/30/16  Yes Rosemarie Ax, MD  carboxymethylcellulose (REFRESH PLUS) 0.5 % SOLN Place 1 drop into both eyes 3 (three) times daily as needed (for eye irritation).   Yes Historical Provider, MD  fluticasone (FLONASE) 50 MCG/ACT nasal spray Place into the nose.   Yes Historical Provider, MD  hydrochlorothiazide (HYDRODIURIL) 12.5 MG tablet Take 0.5 tablets (6.25 mg total) by mouth daily. 05/26/16  Yes Sedalia Muta, FNP  ibuprofen (ADVIL,MOTRIN) 800 MG tablet  10/28/15  Yes Historical Provider, MD  Multiple Vitamin (THERA) TABS Take by mouth.   Yes Historical  Provider, MD  Olopatadine HCl (PAZEO) 0.7 % SOLN Apply to eye.   Yes Historical Provider, MD  omeprazole (PRILOSEC) 40 MG capsule Take 1 capsule (40 mg total) by mouth once. 05/26/16 08/16/16  Sedalia Muta, FNP     Allergies  Allergen Reactions  . Clindamycin Diarrhea and Other (See Comments)    GI upset  . Codeine Other (See Comments)    TACHYCARDIA   . Levofloxacin Other (See Comments)    other  . Metronidazole Other (See Comments)    Other  . Minocin [Minocycline Hcl] Other (See Comments)    Stomach Pains  . Moxifloxacin Swelling    other  .  Penicillins Rash       Objective:  Physical Exam  Constitutional: She is oriented to person, place, and time. She appears well-developed and well-nourished.  HENT:  Head: Normocephalic and atraumatic.  Right Ear: External ear normal.  Left Ear: External ear normal.  Nose: Mucosal edema present. Right sinus exhibits no maxillary sinus tenderness and no frontal sinus tenderness. Left sinus exhibits no maxillary sinus tenderness and no frontal sinus tenderness.  Mouth/Throat: Oropharynx is clear and moist. No oropharyngeal exudate, posterior oropharyngeal edema, posterior oropharyngeal erythema or tonsillar abscesses.  Nasal congestion present.  Neck: Normal range of motion. Neck supple.  Cardiovascular: Regular rhythm, normal heart sounds and intact distal pulses.   Tachycardia -likely related to anxiousness  Pulmonary/Chest: Effort normal and breath sounds normal. No respiratory distress. She has no wheezes. She has no rales. She exhibits no tenderness.  Musculoskeletal: Normal range of motion.  Lymphadenopathy:    She has no cervical adenopathy.  Neurological: She is alert and oriented to person, place, and time.  Skin: Skin is warm and dry.  Psychiatric: Her mood appears anxious.    Vitals:   09/02/16 0949  BP: 126/84  Pulse: (!) 109  Resp: 18  Temp: 98.9 F (37.2 C)   Assessment & Plan:  1. Community acquired pneumonia, unspecified laterality - POCT CBC-unremarkable  -No repeat imaging indicated at present. Lung sounds clear. Patient is not in visible distress. She is able to complete sentences without coughing, wheezing, or becoming short of breath. Suspect, overall her symptoms are improving. Patient is very anxious about conditioning worsening.  Plan:  -Start Benzonatate 100-200 mg up to 3 times daily for cough. -May also take over the counter Delsym to suppress cough. -Provided reassurance that her WBC is normal, lung sounds are completely clear, and that her body  needs rest in order to completely heal and overall symptoms to improve.  Follow-up as needed.  Carroll Sage. Kenton Kingfisher, MSN, FNP-C [facility]

## 2016-09-03 ENCOUNTER — Encounter (HOSPITAL_COMMUNITY): Payer: Self-pay | Admitting: Emergency Medicine

## 2016-09-03 ENCOUNTER — Emergency Department (HOSPITAL_COMMUNITY): Payer: BC Managed Care – PPO

## 2016-09-03 ENCOUNTER — Emergency Department (HOSPITAL_COMMUNITY)
Admission: EM | Admit: 2016-09-03 | Discharge: 2016-09-03 | Disposition: A | Discharge status: Home / Self Care | Payer: BC Managed Care – PPO | Attending: Emergency Medicine | Admitting: Emergency Medicine

## 2016-09-03 DIAGNOSIS — I10 Essential (primary) hypertension: Secondary | ICD-10-CM | POA: Insufficient documentation

## 2016-09-03 DIAGNOSIS — J069 Acute upper respiratory infection, unspecified: Secondary | ICD-10-CM

## 2016-09-03 DIAGNOSIS — R05 Cough: Secondary | ICD-10-CM | POA: Diagnosis present

## 2016-09-03 DIAGNOSIS — J45909 Unspecified asthma, uncomplicated: Secondary | ICD-10-CM | POA: Diagnosis not present

## 2016-09-03 DIAGNOSIS — B9789 Other viral agents as the cause of diseases classified elsewhere: Secondary | ICD-10-CM

## 2016-09-03 LAB — I-STAT BETA HCG BLOOD, ED (MC, WL, AP ONLY)

## 2016-09-03 LAB — BASIC METABOLIC PANEL
ANION GAP: 8 (ref 5–15)
BUN: 5 mg/dL — ABNORMAL LOW (ref 6–20)
CHLORIDE: 105 mmol/L (ref 101–111)
CO2: 25 mmol/L (ref 22–32)
CREATININE: 0.71 mg/dL (ref 0.44–1.00)
Calcium: 9.5 mg/dL (ref 8.9–10.3)
GFR calc non Af Amer: >60 mL/min (ref >60)
Glucose, Bld: 148 mg/dL — ABNORMAL HIGH (ref 65–99)
Potassium: 3.1 mmol/L — ABNORMAL LOW (ref 3.5–5.1)
SODIUM: 138 mmol/L (ref 135–145)

## 2016-09-03 LAB — CBC WITH DIFFERENTIAL/PLATELET
BASOS ABS: 0 10*3/uL (ref 0.0–0.1)
BASOS PCT: 1 %
EOS ABS: 0.2 10*3/uL (ref 0.0–0.7)
Eosinophils Relative: 2 %
HEMATOCRIT: 37.5 % (ref 36.0–46.0)
HEMOGLOBIN: 12.8 g/dL (ref 12.0–15.0)
Lymphocytes Relative: 23 %
Lymphs Abs: 1.8 10*3/uL (ref 0.7–4.0)
MCH: 29.5 pg (ref 26.0–34.0)
MCHC: 34.1 g/dL (ref 30.0–36.0)
MCV: 86.4 fL (ref 78.0–100.0)
Monocytes Absolute: 0.6 10*3/uL (ref 0.1–1.0)
Monocytes Relative: 8 %
NEUTROS ABS: 5.1 10*3/uL (ref 1.7–7.7)
NEUTROS PCT: 66 %
Platelets: 322 10*3/uL (ref 150–400)
RBC: 4.34 MIL/uL (ref 3.87–5.11)
RDW: 12.7 % (ref 11.5–15.5)
WBC: 7.6 10*3/uL (ref 4.0–10.5)

## 2016-09-03 MED ORDER — ALBUTEROL SULFATE HFA 108 (90 BASE) MCG/ACT IN AERS: 1.0000 | INHALATION_SPRAY | Freq: Four times a day (QID) | RESPIRATORY_TRACT | 0 refills | Status: AC | PRN

## 2016-09-03 MED ORDER — ALBUTEROL SULFATE HFA 108 (90 BASE) MCG/ACT IN AERS
2.0000 | INHALATION_SPRAY | Freq: Once | RESPIRATORY_TRACT | Status: AC
  Administered 2016-09-03: 2 via RESPIRATORY_TRACT
  Filled 2016-09-03: qty 6.7

## 2016-09-03 MED ORDER — POTASSIUM CHLORIDE CRYS ER 20 MEQ PO TBCR
40.0000 meq | EXTENDED_RELEASE_TABLET | Freq: Once | ORAL | Status: AC
  Administered 2016-09-03: 40 meq via ORAL
  Filled 2016-09-03: qty 2

## 2016-09-03 NOTE — ED Provider Notes (Signed)
[facility] DEPT Provider Note   CSN: [id number] Arrival date & time: 09/03/16  2042     History   Chief Complaint Chief Complaint  Patient presents with  . Cough    Chest Congestion     HPI Megan Lang is a 45 y.o. female.  Patient presents with recurrent productive cough just ingestion. Patient finished azithromycin by primary doctor. Patient feels it has not improved her symptoms. Only mild soreness of breath with coughing. No significant sick contacts. No fever. Symptoms started on Monday.      Past Medical History:  Diagnosis Date  . Acid reflux   . Allergy   . Aortic aneurysm ([address])   . Asthma   . Cardiomegaly   . Carpal tunnel syndrome   . Gastric polyp   . GERD (gastroesophageal reflux disease) 01/23/2016  . Hiatal hernia   . Hypertension   . Normal spontaneous vaginal delivery    3    Patient Active Problem List   Diagnosis Date Noted  . Cough 08/30/2016  . Nexplanon in place 07/29/2016  . Cardiomegaly 07/01/2016  . History of aortic aneurysm 07/01/2016  . GERD (gastroesophageal reflux disease) 01/23/2016  . Dyspnea 10/27/2015  . Costochondritis 08/26/2015  . Obesity 07/01/2015  . Upper airway cough syndrome 06/30/2015  . History of shingles 01/26/2015  . Family history of colon cancer 10/27/2011  . Essential hypertension 10/27/2011    Past Surgical History:  Procedure Laterality Date  . CARPAL TUNNEL RELEASE  2009  . CARPAL TUNNEL RELEASE Left   . COLONOSCOPY    . NASAL SINUS SURGERY     DEC 15,2016    OB History    Gravida Para Term Preterm AB Living   3 3 3     3    SAB TAB Ectopic Multiple Live Births           3       Home Medications    Prior to Admission medications   Medication Sig Start Date End Date Taking? Authorizing Provider  acetaminophen (TYLENOL) 500 MG tablet Take 500 mg by mouth.    Historical Provider, MD  albuterol (PROVENTIL HFA;VENTOLIN HFA) 108 (90 Base) MCG/ACT inhaler Inhale 1-2 puffs into the lungs  every 6 (six) hours as needed for wheezing or shortness of breath. 09/03/16   Elnora Morrison, MD  amLODipine (NORVASC) 5 MG tablet Take 1 tablet (5 mg total) by mouth daily. 06/29/16   Sedalia Muta, FNP  azithromycin (ZITHROMAX) 250 MG tablet Take 500 mg on the first day and then 250 mg for 4 more days. For a total of 5 days. 08/30/16   Rosemarie Ax, MD  benzonatate (TESSALON) 100 MG capsule Take 2 capsules (200 mg total) by mouth 3 (three) times daily as needed for cough. 09/02/16   Sedalia Muta, FNP  carboxymethylcellulose (REFRESH PLUS) 0.5 % SOLN Place 1 drop into both eyes 3 (three) times daily as needed (for eye irritation).    Historical Provider, MD  fluticasone (FLONASE) 50 MCG/ACT nasal spray Place into the nose.    Historical Provider, MD  hydrochlorothiazide (HYDRODIURIL) 12.5 MG tablet Take 0.5 tablets (6.25 mg total) by mouth daily. 05/26/16   Sedalia Muta, FNP  ibuprofen (ADVIL,MOTRIN) 800 MG tablet  10/28/15   Historical Provider, MD  Multiple Vitamin (THERA) TABS Take by mouth.    Historical Provider, MD  Olopatadine HCl (PAZEO) 0.7 % SOLN Apply to eye.    Historical Provider, MD  omeprazole (PRILOSEC) 40  MG capsule Take 1 capsule (40 mg total) by mouth once. 05/26/16 08/16/16  Sedalia Muta, FNP    Family History Family History  Problem Relation Age of Onset  . Hypertension Mother   . Kidney disease Father   . Liver disease Father     Unknown diagnosis  . Breast cancer Maternal Grandmother   . Hypertension Sister   . Hypertension Brother     Social History Social History  Substance Use Topics  . Smoking status: Never Smoker  . Smokeless tobacco: Never Used  . Alcohol use No     Allergies   Clindamycin; Codeine; Levofloxacin; Metronidazole; Minocin [minocycline hcl]; Moxifloxacin; and Penicillins   Review of Systems Review of Systems  Constitutional: Negative for chills and fever.  HENT: Positive for congestion.    Eyes: Negative for visual disturbance.  Respiratory: Positive for cough.   Cardiovascular: Negative for chest pain.  Gastrointestinal: Negative for abdominal pain and vomiting.  Genitourinary: Negative for dysuria and flank pain.  Musculoskeletal: Negative for back pain, neck pain and neck stiffness.  Skin: Negative for rash.  Neurological: Negative for light-headedness and headaches.     Physical Exam Updated Vital Signs BP 151/91 (BP Location: Left Arm)   Pulse 103   Temp 99.5 F (37.5 C) (Oral)   Resp 16   LMP 07/29/2016 (Approximate)   SpO2 100%   Physical Exam  Constitutional: She appears well-developed and well-nourished. No distress.  HENT:  Head: Normocephalic and atraumatic.  Congestion  Eyes: Conjunctivae are normal.  Neck: Neck supple.  Cardiovascular: Normal rate and regular rhythm.   No murmur heard. Pulmonary/Chest: Effort normal and breath sounds normal. No respiratory distress.  Abdominal: Soft. There is no tenderness.  Musculoskeletal: She exhibits no edema.  Neurological: She is alert.  Skin: Skin is warm and dry.  Psychiatric: She has a normal mood and affect.  Nursing note and vitals reviewed.    ED Treatments / Results  Labs (all labs ordered are listed, but only abnormal results are displayed) Labs Reviewed  BASIC METABOLIC PANEL - Abnormal; Notable for the following:       Result Value   Potassium 3.1 (*)    Glucose, Bld 148 (*)    BUN 5 (*)    All other components within normal limits  CBC WITH DIFFERENTIAL/PLATELET  I-STAT BETA HCG BLOOD, ED ([facility], [facility], [facility] ONLY)    EKG  EKG Interpretation None       Radiology Dg Chest 2 View  Result Date: 09/03/2016 CLINICAL DATA:  Cough with chills and congestion EXAM: CHEST  2 VIEW COMPARISON:  August 30, 2016 FINDINGS: There is no edema or consolidation. Heart is upper normal in size with pulmonary vascularity within normal limits. No adenopathy. No bone lesions. IMPRESSION: No edema or  consolidation. Electronically Signed   By: Lowella Grip III M.D.   On: 09/03/2016 21:45    Procedures Procedures (including critical care time)  Medications Ordered in ED Medications  albuterol (PROVENTIL HFA;VENTOLIN HFA) 108 (90 Base) MCG/ACT inhaler 2 puff (not administered)  potassium chloride SA (K-DUR,KLOR-CON) CR tablet 40 mEq (40 mEq Oral Given 09/03/16 2319)     Initial Impression / Assessment and Plan / ED Course  I have reviewed the triage vital signs and the nursing notes.  Pertinent labs & imaging results that were available during my care of the patient were reviewed by me and considered in my medical decision making (see chart for details).  Clinical Course    Patient  presents with clinical bronchitis/upper resp infection. Patient had blood work and chest x-ray upon arrival unremarkable. Discussed supportive care and outpatient follow. No indication for repeat antibiotics  Results and differential diagnosis were discussed with the patient/parent/guardian. Xrays were independently reviewed by myself.  Close follow up outpatient was discussed, comfortable with the plan.   Medications  albuterol (PROVENTIL HFA;VENTOLIN HFA) 108 (90 Base) MCG/ACT inhaler 2 puff (not administered)  potassium chloride SA (K-DUR,KLOR-CON) CR tablet 40 mEq (40 mEq Oral Given 09/03/16 2319)    Vitals:   09/03/16 2115 09/03/16 2322  BP: 151/91 152/90  Pulse: 103 108  Resp: 16 16  Temp: 99.5 F (37.5 C) 98.4 F (36.9 C)  TempSrc: Oral Oral  SpO2: 100% 100%    Final diagnoses:  Viral URI with cough     Final Clinical Impressions(s) / ED Diagnoses   Final diagnoses:  Viral URI with cough    New Prescriptions New Prescriptions   ALBUTEROL (PROVENTIL HFA;VENTOLIN HFA) 108 (90 BASE) MCG/ACT INHALER    Inhale 1-2 puffs into the lungs every 6 (six) hours as needed for wheezing or shortness of breath.     Elnora Morrison, MD 09/03/16 [id number]

## 2016-09-03 NOTE — Discharge Instructions (Signed)
Try albuterol for cough.  If you were given medicines take as directed.  If you are on coumadin or contraceptives realize their levels and effectiveness is altered by many different medicines.  If you have any reaction (rash, tongues swelling, other) to the medicines stop taking and see a physician.    If your blood pressure was elevated in the ER make sure you follow up for management with a primary doctor or return for chest pain, shortness of breath or stroke symptoms.  Please follow up as directed and return to the ER or see a physician for new or worsening symptoms.  Thank you. Vitals:   09/03/16 2115  BP: 151/91  Pulse: 103  Resp: 16  Temp: 99.5 F (37.5 C)  TempSrc: Oral  SpO2: 100%

## 2016-09-03 NOTE — ED Triage Notes (Signed)
Pt. reports persistent productive cough with chest congestion , runny nose and nasal congestion onset last week prescribed with oral antibiotic by her PCP with no improvement  . Denies fever or chills .

## 2016-09-06 ENCOUNTER — Ambulatory Visit (INDEPENDENT_AMBULATORY_CARE_PROVIDER_SITE_OTHER): Payer: BC Managed Care – PPO | Admitting: Family Medicine

## 2016-09-06 VITALS — BP 144/90 | HR 99 | Temp 98.4°F | Resp 16 | Ht 63.0 in | Wt 174.0 lb

## 2016-09-06 DIAGNOSIS — M62830 Muscle spasm of back: Secondary | ICD-10-CM | POA: Diagnosis not present

## 2016-09-06 DIAGNOSIS — R05 Cough: Secondary | ICD-10-CM | POA: Diagnosis not present

## 2016-09-06 DIAGNOSIS — H938X9 Other specified disorders of ear, unspecified ear: Secondary | ICD-10-CM | POA: Diagnosis not present

## 2016-09-06 DIAGNOSIS — E876 Hypokalemia: Secondary | ICD-10-CM

## 2016-09-06 DIAGNOSIS — R059 Cough, unspecified: Secondary | ICD-10-CM

## 2016-09-06 LAB — BASIC METABOLIC PANEL
BUN: 7 mg/dL (ref 7–25)
CO2: 28 mmol/L (ref 20–31)
Calcium: 9.4 mg/dL (ref 8.6–10.2)
Chloride: 105 mmol/L (ref 98–110)
Creat: 0.65 mg/dL (ref 0.50–1.10)
GLUCOSE: 84 mg/dL (ref 65–99)
POTASSIUM: 3.4 mmol/L — AB (ref 3.5–5.3)
SODIUM: 140 mmol/L (ref 135–146)

## 2016-09-06 NOTE — Progress Notes (Signed)
Subjective:  By signing my name below, I, Essence Howell, attest that this documentation has been prepared under the direction and in the presence of Wendie Agreste, MD Electronically Signed: Ladene Artist, ED Scribe 09/06/2016 at 12:04 PM.   Patient ID: Megan Lang, female    DOB: 05-14-1971, 45 y.o.   MRN: [id number]  Chief Complaint  Patient presents with  . Follow-up    x 1 week, pneumonia   HPI  HPI Comments: Megan Lang is a 45 y.o. female who presents to the [facility] for a follow-up on pneumonia. She was seen on 11/16 by Molli Barrows for follow-up on pneumonia, initially diagnonsed on 11/13. On 11/15 she had been improving with resolution of fever and oxygen normal, however, returned the following day to see Molli Barrows due to subjective fever and chills. She was started on Tessalon for cough, OTC Delsym. She was seen the following day in the ER due to persistent cough and feeling that azithromycin had not improved symptoms. No indication for repeat antibiotics. Supportive care discussed. Pt had a CXR on 11/17 that showed no edema or consolidation. She had a normal CBC at that time. Today, pt states that she has tried [employer], advertising x2 daily which she states has improved her symptoms. She also reports that she has had some pressure in her right ear along with a "clicking" sound. She denies fever with the past 2 days.   Pt also reports mild left rib pain that she noticed a few days ago while stretching. She suspects that she injured the area while coughing. No treatments tried PTA.   Hypokalemia   Pt's potassium was 3.1 at the ER visit 3 days ago.   Patient Active Problem List   Diagnosis Date Noted  . Cough 08/30/2016  . Nexplanon in place 07/29/2016  . Cardiomegaly 07/01/2016  . History of aortic aneurysm 07/01/2016  . GERD (gastroesophageal reflux disease) 01/23/2016  . Dyspnea 10/27/2015  . Costochondritis 08/26/2015  . Obesity  07/01/2015  . Upper airway cough syndrome 06/30/2015  . History of shingles 01/26/2015  . Family history of colon cancer 10/27/2011  . Essential hypertension 10/27/2011   Past Medical History:  Diagnosis Date  . Acid reflux   . Allergy   . Aortic aneurysm ([place])   . Asthma   . Cardiomegaly   . Carpal tunnel syndrome   . Gastric polyp   . GERD (gastroesophageal reflux disease) 01/23/2016  . Hiatal hernia   . Hypertension   . Normal spontaneous vaginal delivery    3   Past Surgical History:  Procedure Laterality Date  . CARPAL TUNNEL RELEASE  2009  . CARPAL TUNNEL RELEASE Left   . COLONOSCOPY    . NASAL SINUS SURGERY     DEC 15,2016   Allergies  Allergen Reactions  . Clindamycin Diarrhea and Other (See Comments)    GI upset  . Codeine Other (See Comments)    TACHYCARDIA   . Levofloxacin Other (See Comments)    other  . Metronidazole Other (See Comments)    Other  . Minocin [Minocycline Hcl] Other (See Comments)    Stomach Pains  . Moxifloxacin Swelling    other  . Penicillins Rash    Has patient had a PCN reaction causing immediate rash, facial/tongue/throat swelling, SOB or lightheadedness with hypotension:yes Has patient had a PCN reaction causing severe rash involving mucus membranes or skin necrosis:no Has patient had a PCN reaction that required hospitalization:no  Has patient had a PCN reaction occurring within the last 10 years:No If all of the above answers are "NO", then may proceed with Cephalosporin   Prior to Admission medications   Medication Sig Start Date End Date Taking? Authorizing Provider  acetaminophen (TYLENOL) 500 MG tablet Take 500 mg by mouth.   Yes Historical Provider, MD  albuterol (PROVENTIL HFA;VENTOLIN HFA) 108 (90 Base) MCG/ACT inhaler Inhale 1-2 puffs into the lungs every 6 (six) hours as needed for wheezing or shortness of breath. 09/03/16  Yes Elnora Morrison, MD  amLODipine (NORVASC) 5 MG tablet Take 1 tablet (5 mg total) by mouth  daily. 06/29/16  Yes Sedalia Muta, FNP  azithromycin (ZITHROMAX) 250 MG tablet Take 500 mg on the first day and then 250 mg for 4 more days. For a total of 5 days. 08/30/16  Yes Rosemarie Ax, MD  benzonatate (TESSALON) 100 MG capsule Take 2 capsules (200 mg total) by mouth 3 (three) times daily as needed for cough. 09/02/16  Yes Sedalia Muta, FNP  carboxymethylcellulose (REFRESH PLUS) 0.5 % SOLN Place 1 drop into both eyes 3 (three) times daily as needed (for eye irritation).   Yes Historical Provider, MD  fluticasone (FLONASE) 50 MCG/ACT nasal spray Place into the nose.   Yes Historical Provider, MD  hydrochlorothiazide (HYDRODIURIL) 12.5 MG tablet Take 0.5 tablets (6.25 mg total) by mouth daily. 05/26/16  Yes Sedalia Muta, FNP  ibuprofen (ADVIL,MOTRIN) 800 MG tablet  10/28/15  Yes Historical Provider, MD  Multiple Vitamin (THERA) TABS Take by mouth.   Yes Historical Provider, MD  Olopatadine HCl (PAZEO) 0.7 % SOLN Apply to eye.   Yes Historical Provider, MD  omeprazole (PRILOSEC) 40 MG capsule Take 1 capsule (40 mg total) by mouth once. 05/26/16 08/16/16  Sedalia Muta, FNP   Social History   Social History  . Marital status: Married    Spouse name: N/A  . Number of children: 3  . 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 Smoking status: Never Smoker  . Smokeless tobacco: Never Used  . Alcohol use No  . Drug use: No  . 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 Not on file   Social History Narrative   Epworth Sleepiness Scale = 8 (as of 10/28/2015)   Review of Systems  Constitutional: Negative for fever.  HENT: Ear pain: R ear pressure.   Musculoskeletal: Positive for myalgias.   BP (!) 144/90 (BP Location: Left Arm, Patient Position: Sitting, Cuff Size: Large)   Pulse 99   Temp 98.4 F (36.9 C) (Oral)   Resp 16    Ht 5\' 3"  (1.6 m)   Wt 174 lb (78.9 kg)   LMP 07/29/2016 (Approximate)   SpO2 99%   BMI 30.82 kg/m     Objective:   Physical Exam  Constitutional: She is oriented to person, place, and time. She appears well-developed and well-nourished. No distress.  HENT:  Head: Normocephalic and atraumatic.  Right Ear: Hearing, tympanic membrane, external ear and ear canal normal.  Left Ear: Hearing, tympanic membrane, external ear and ear canal normal.  Nose: Nose normal.  Mouth/Throat: Oropharynx is clear and moist. No oropharyngeal exudate.  Minimal fluid at the base of the R TM. Canal is clear. TM is pearly grey. Minimal clear fluid in the L.  Eyes: Conjunctivae and EOM are normal. Pupils are equal, round, and reactive to light.  Cardiovascular: Normal rate, regular rhythm, normal heart sounds and intact distal pulses.   No murmur heard. Pulmonary/Chest: Effort normal and breath sounds normal. No respiratory distress. She has no wheezes. She has no rhonchi.  Musculoskeletal:  Tender over the lower L rib margin. Possible slight area of spasm over the muscle.   Neurological: She is alert and oriented to person, place, and time.  Skin: Skin is warm and dry. No rash noted.  Psychiatric: She has a normal mood and affect. Her behavior is normal.  Vitals reviewed.     Assessment & Plan:   Megan Lang is a 45 y.o. female Cough  - Improving, no repeat antibiotics appear to be necessary at this time. Also advised to change back to Coricidin HDP or Delsym as other medication may be affecting her blood pressure. RTC precautions  Hypokalemia - Plan: Basic metabolic panel  -Repeat BMP, then consider recheck in a few weeks to also make sure it is stable at that time as required supplementation previously   Pressure sensation in ear, unspecified laterality  -May be component of serous otitis due to underlying infection. RTC precautions if not improving within the next week or 2, or can refer to  ENT if needed  Back muscle spasm  -Likely intercostal strain or paraspinal strain with coughing. Symptomatic care discussed, RTC precautions. See patient instructions  No orders of the defined types were placed in this encounter.  Patient Instructions   Sound clear today, and encouraged that cough is improving. No other antibiotics needed at this time. As soon as possible, transition back to Coricidin HBP or Delsym for cough, and monitor your blood pressure.  Your ear pressure and symptoms are likely due to clear fluid behind the ears from a viral infection. If that is not improving within the next week or 2 or worsening sooner, you can recheck here or with your ear nose and throat provider.   The sore area and your back is likely a small area of muscle spasm or pulled muscle from coughing. Heat or ice over the counter as needed, Tylenol as needed, but if not improving in the next 2 weeks or worsening sooner, return for recheck.  I will recheck potassium level today, then recommend recheck in the next few weeks to make sure it stays stable.   Return to the clinic or go to the nearest emergency room if any of your symptoms worsen or new symptoms occur.  Barotitis Media Barotitis media is inflammation of your middle ear. This occurs when the auditory tube (eustachian tube) leading from the back of your nose (nasopharynx) to your eardrum is blocked. This blockage may result from a cold, environmental allergies, or an upper respiratory infection. Unresolved barotitis media may lead to damage or hearing loss (barotrauma), which may become permanent. HOME CARE INSTRUCTIONS   Use medicines as recommended by your health care provider. Over-the-counter medicines will help unblock the canal and can help during times of air travel.  Do not put anything into your ears to clean or unplug them. Eardrops will not be helpful.  Do not swim, dive, or fly until your health care provider says it is all right  to do so. If these activities are necessary, chewing gum with frequent, forceful swallowing may help. It is also helpful to hold your nose and gently blow to pop your ears for equalizing pressure changes. This forces air  into the eustachian tube.  Only take over-the-counter or prescription medicines for pain, discomfort, or fever as directed by your health care provider.  A decongestant may be helpful in decongesting the middle ear and make pressure equalization easier. SEEK MEDICAL CARE IF:  You experience a serious form of dizziness in which you feel as if the room is spinning and you feel nauseated (vertigo).  Your symptoms only involve one ear. SEEK IMMEDIATE MEDICAL CARE IF:   You develop a severe headache, dizziness, or severe ear pain.  You have bloody or pus-like drainage from your ears.  You develop a fever.  Your problems do not improve or become worse. MAKE SURE YOU:   Understand these instructions.  Will watch your condition.  Will get help right away if you are not doing well or get worse. This information is not intended to replace advice given to you by your health care provider. Make sure you discuss any questions you have with your health care provider. Document Released: 10/01/2000 Document Revised: 07/25/2013 Document Reviewed: 05/01/2013 Elsevier Interactive Patient Education  [employer].    I personally performed the services described in this documentation, which was scribed in my presence. The recorded information has been reviewed and considered, and addended by me as needed.   Signed,   Merri Ray, MD [facility].  09/08/16 3:39 PM

## 2016-09-06 NOTE — Patient Instructions (Signed)
  Sound clear today, and encouraged that cough is improving. No other antibiotics needed at this time. As soon as possible, transition back to Coricidin HBP or Delsym for cough, and monitor your blood pressure.  Your ear pressure and symptoms are likely due to clear fluid behind the ears from a viral infection. If that is not improving within the next week or 2 or worsening sooner, you can recheck here or with your ear nose and throat provider.   The sore area and your back is likely a small area of muscle spasm or pulled muscle from coughing. Heat or ice over the counter as needed, Tylenol as needed, but if not improving in the next 2 weeks or worsening sooner, return for recheck.  I will recheck potassium level today, then recommend recheck in the next few weeks to make sure it stays stable.   Return to the clinic or go to the nearest emergency room if any of your symptoms worsen or new symptoms occur.  Barotitis Media Barotitis media is inflammation of your middle ear. This occurs when the auditory tube (eustachian tube) leading from the back of your nose (nasopharynx) to your eardrum is blocked. This blockage may result from a cold, environmental allergies, or an upper respiratory infection. Unresolved barotitis media may lead to damage or hearing loss (barotrauma), which may become permanent. HOME CARE INSTRUCTIONS   Use medicines as recommended by your health care provider. Over-the-counter medicines will help unblock the canal and can help during times of air travel.  Do not put anything into your ears to clean or unplug them. Eardrops will not be helpful.  Do not swim, dive, or fly until your health care provider says it is all right to do so. If these activities are necessary, chewing gum with frequent, forceful swallowing may help. It is also helpful to hold your nose and gently blow to pop your ears for equalizing pressure changes. This forces air into the eustachian tube.  Only take  over-the-counter or prescription medicines for pain, discomfort, or fever as directed by your health care provider.  A decongestant may be helpful in decongesting the middle ear and make pressure equalization easier. SEEK MEDICAL CARE IF:  You experience a serious form of dizziness in which you feel as if the room is spinning and you feel nauseated (vertigo).  Your symptoms only involve one ear. SEEK IMMEDIATE MEDICAL CARE IF:   You develop a severe headache, dizziness, or severe ear pain.  You have bloody or pus-like drainage from your ears.  You develop a fever.  Your problems do not improve or become worse. MAKE SURE YOU:   Understand these instructions.  Will watch your condition.  Will get help right away if you are not doing well or get worse. This information is not intended to replace advice given to you by your health care provider. Make sure you discuss any questions you have with your health care provider. Document Released: 10/01/2000 Document Revised: 07/25/2013 Document Reviewed: 05/01/2013 Elsevier Interactive Patient Education  2017 [employer].

## 2016-09-07 ENCOUNTER — Ambulatory Visit: Payer: BC Managed Care – PPO

## 2016-10-05 ENCOUNTER — Other Ambulatory Visit: Payer: Self-pay | Admitting: Cardiothoracic Surgery

## 2016-10-05 DIAGNOSIS — Z8679 Personal history of other diseases of the circulatory system: Secondary | ICD-10-CM

## 2016-10-05 DIAGNOSIS — Z9889 Other specified postprocedural states: Secondary | ICD-10-CM

## 2016-10-08 ENCOUNTER — Encounter: Payer: Self-pay | Admitting: Gynecology

## 2016-10-08 ENCOUNTER — Ambulatory Visit (INDEPENDENT_AMBULATORY_CARE_PROVIDER_SITE_OTHER): Payer: BC Managed Care – PPO | Admitting: Gynecology

## 2016-10-08 VITALS — BP 140/88

## 2016-10-08 DIAGNOSIS — N644 Mastodynia: Secondary | ICD-10-CM | POA: Diagnosis not present

## 2016-10-08 NOTE — Progress Notes (Signed)
   Patient presented to the office today stating that since early this week she had notice her breast be tender bilateral. She was question whether she not have a nodular area near the right nipple. Patient denied any nipple discharge or any recent trauma. No first line relative with breast cancer only her grandmother. She had a normal mammogram in March of this year. She has the Nexplanon for contraception and is having no menstrual cycle and has done well. Patient stated recently she has been consuming large quantities of tea.  Breast exam: Both breasts were examined sitting supine position both breasts are symmetrical in appearance no nipple inversion no skin discoloration no palpable masses or tenderness no supra clavicular axillary lymphadenopathy  Assessment/plan: Patient with bilateral mastodynia probably due to large consumption of ice tea recently. We discussed cutting down on consumption of caffeine-containing products to minimize her breast tenderness and also recommend biannual over-the-counter vitamin E6 100 units daily. Patient is otherwise scheduled to return back to the office in February next year for annual exam or when necessary.

## 2016-10-08 NOTE — Patient Instructions (Signed)
Breast Tenderness Breast tenderness is a common problem for women of all ages. Breast tenderness may cause mild discomfort to severe pain. The pain usually comes and goes in association with your menstrual cycle, but it can be constant. Breast tenderness has many possible causes, including hormone changes and some medicines. Your health care provider may order tests, such as a mammogram or an ultrasound, to check for any unusual findings. Having breast tenderness usually does not mean that you have breast cancer. Follow these instructions at home: Sometimes, reassurance that you do not have breast cancer is all that is needed. In general, follow these home care instructions: Managing pain and discomfort  If directed, apply ice to the area:  Put ice in a plastic bag.  Place a towel between your skin and the bag.  Leave the ice on for 20 minutes, 2-3 times a day.  Make sure you are wearing a supportive bra, especially during exercise. You may also want to wear a supportive bra while sleeping if your breasts are very tender. Medicines  Take over-the-counter and prescription medicines only as told by your health care provider. If the cause of your pain is infection, you may be prescribed an antibiotic medicine.  If you were prescribed an antibiotic, take it as told by your health care provider. Do not stop taking the antibiotic even if you start to feel better. General instructions  Your health care provider may recommend that you reduce the amount of fat in your diet. You can do this by:  Limiting fried foods.  Cooking foods using methods, such as baking, boiling, grilling, and broiling.  Decrease the amount of caffeine in your diet. You can do this by drinking more water and choosing caffeine-free options.  Keep a log of the days and times when your breasts are most tender.  Ask your health care provider how to do breast exams at home. This will help you notice if you have an unusual  growth or lump. Contact a health care provider if:  Any part of your breast is hard, red, and hot to the touch. This may be a sign of infection.  You are not breastfeeding and you have fluid, especially blood or pus, coming out of your nipples.  You have a fever.  You have a new or painful lump in your breast that remains after your menstrual period ends.  Your pain does not improve or it gets worse.  Your pain is interfering with your daily activities. This information is not intended to replace advice given to you by your health care provider. Make sure you discuss any questions you have with your health care provider. Document Released: 09/16/2008 Document Revised: 07/02/2016 Document Reviewed: 07/02/2016 Elsevier Interactive Patient Education  2017 Elsevier Inc.  

## 2016-10-24 ENCOUNTER — Other Ambulatory Visit: Payer: Self-pay | Admitting: Family Medicine

## 2016-10-30 ENCOUNTER — Ambulatory Visit: Payer: BC Managed Care – PPO

## 2016-11-03 ENCOUNTER — Other Ambulatory Visit: Payer: BC Managed Care – PPO

## 2016-11-03 ENCOUNTER — Ambulatory Visit: Payer: BC Managed Care – PPO | Admitting: Cardiothoracic Surgery

## 2016-11-06 ENCOUNTER — Ambulatory Visit (INDEPENDENT_AMBULATORY_CARE_PROVIDER_SITE_OTHER): Payer: BC Managed Care – PPO | Admitting: Family Medicine

## 2016-11-06 VITALS — BP 130/90 | HR 92 | Temp 98.1°F | Resp 18 | Ht 63.0 in | Wt 182.5 lb

## 2016-11-06 DIAGNOSIS — M549 Dorsalgia, unspecified: Secondary | ICD-10-CM | POA: Diagnosis not present

## 2016-11-06 DIAGNOSIS — G8929 Other chronic pain: Secondary | ICD-10-CM

## 2016-11-06 DIAGNOSIS — I719 Aortic aneurysm of unspecified site, without rupture: Secondary | ICD-10-CM

## 2016-11-06 DIAGNOSIS — I10 Essential (primary) hypertension: Secondary | ICD-10-CM | POA: Diagnosis not present

## 2016-11-06 MED ORDER — HYDROCHLOROTHIAZIDE 12.5 MG PO TABS: 6.2500 mg | ORAL_TABLET | Freq: Every day | ORAL | 3 refills | Status: DC

## 2016-11-06 MED ORDER — AMLODIPINE BESYLATE 5 MG PO TABS: 5.0000 mg | ORAL_TABLET | Freq: Every day | ORAL | 3 refills | Status: DC

## 2016-11-06 MED ORDER — MELOXICAM 15 MG PO TABS: 7.5000 mg | ORAL_TABLET | Freq: Every day | ORAL | 1 refills | Status: DC | PRN

## 2016-11-06 MED ORDER — HYDROCHLOROTHIAZIDE 12.5 MG PO TABS: 6.2500 mg | ORAL_TABLET | Freq: Every day | ORAL | 0 refills | Status: DC

## 2016-11-06 NOTE — Progress Notes (Signed)
Patient ID: Megan Lang, female    DOB: 1971-03-27, 46 y.o.   MRN: [id number]  PCP: Molli Barrows, FNP  Chief Complaint  Patient presents with  . Medication Refill    (pended meds)  . Cyst    on left side of back near rib x 3 months    Subjective:  HPI 46 year old female presents for hypertension refill. Past medical hx includes: HTN, cardiomegaly, aortic aneurysm and GERD.  Hypertension Continued controlled readings less than 140/90.  Monitors blood pressures frequently at home. Readings are very stable. Denies side effects of medication.  Left Sided Back Pain Reports an aching sensation in the left mid back with the sensation of a small mass or cyst under the skin. Reports that she has had it evaluated in the past, but the pain stopped until recently. Reports intermittent aching mostly with lifting, certain stretching movement, or   bending down left side near her rib cage. She hasn't taken any medication for pain.  Aortic Aneurysm  February 14 she is scheduled for CT to visualize size of the aneurysm.  Social History   Social History  . Marital status: Married    Spouse name: N/A  . Number of children: 3  . 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 Smoking status: Never Smoker  . Smokeless tobacco: Never Used  . Alcohol use No  . Drug use: No  . 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 Not on file   Social History Narrative   Epworth Sleepiness Scale = 8 (as of 10/28/2015)    Family History  Problem Relation Age of Onset  . Hypertension Mother   . Kidney disease Father   . Liver disease Father     Unknown diagnosis  . Breast cancer Maternal Grandmother   . Hypertension Sister   . Hypertension Brother    Review of Systems See HPI Patient Active Problem List   Diagnosis Date Noted  . Cough 08/30/2016  . Nexplanon in place  07/29/2016  . Cardiomegaly 07/01/2016  . History of aortic aneurysm 07/01/2016  . GERD (gastroesophageal reflux disease) 01/23/2016  . Dyspnea 10/27/2015  . Costochondritis 08/26/2015  . Obesity 07/01/2015  . Upper airway cough syndrome 06/30/2015  . History of shingles 01/26/2015  . Family history of colon cancer 10/27/2011  . Essential hypertension 10/27/2011      Prior to Admission medications   Medication Sig Start Date End Date Taking? Authorizing Provider  acetaminophen (TYLENOL) 500 MG tablet Take 500 mg by mouth.   Yes Historical Provider, MD  albuterol (PROVENTIL HFA;VENTOLIN HFA) 108 (90 Base) MCG/ACT inhaler Inhale 1-2 puffs into the lungs every 6 (six) hours as needed for wheezing or shortness of breath. 09/03/16  Yes Elnora Morrison, MD  amLODipine (NORVASC) 5 MG tablet Take 1 tablet (5 mg total) by mouth daily. 06/29/16  Yes [place] Muta, FNP  carboxymethylcellulose (REFRESH PLUS) 0.5 % SOLN Place 1 drop into both eyes 3 (three) times daily as needed (for eye irritation).   Yes Historical Provider, MD  hydrochlorothiazide (HYDRODIURIL) 12.5 MG tablet TAKE ONE-HALF TABLET BY MOUTH ONCE DAILY 10/25/16  Yes [place] Muta, FNP  ibuprofen (ADVIL,MOTRIN) 800 MG tablet  10/28/15  Yes Historical Provider, MD  Multiple Vitamin (THERA) TABS Take by mouth.   Yes Historical Provider, MD  Olopatadine HCl (PAZEO) 0.7 % SOLN Apply to eye.   Yes Historical Provider, MD  fluticasone (FLONASE) 50 MCG/ACT nasal spray Place into the nose.    Historical Provider, MD  omeprazole (PRILOSEC) 40 MG capsule Take 1 capsule (40 mg total) by mouth once. 05/26/16 08/16/16  [place] Muta, FNP  Past Medical, Surgical Family and Social History reviewed and updated.   Objective:   Today's Vitals   11/06/16 1158  BP: 130/90  Pulse: 92  Resp: 18  Temp: 98.1 F (36.7 C)  TempSrc: Oral  SpO2: 98%  Weight: 182 lb 8 oz (82.8 kg)  Height: 5\' 3"  (1.6 m)    Wt Readings  from Last 3 Encounters:  11/06/16 182 lb 8 oz (82.8 kg)  09/06/16 174 lb (78.9 kg)  09/02/16 172 lb (78 kg)   Physical Exam  Constitutional: She is oriented to person, place, and time. She appears well-developed and well-nourished.  HENT:  Head: Normocephalic and atraumatic.  Eyes: Pupils are equal, round, and reactive to light.  Neck: Normal range of motion. Neck supple. No thyromegaly present.  Cardiovascular: Normal rate, regular rhythm, normal heart sounds and intact distal pulses.   Pulmonary/Chest: Effort normal and breath sounds normal.  Musculoskeletal: Normal range of motion. She exhibits tenderness.  Left back in region of T9-T10 tenderness with palpation  Neurological: She is alert and oriented to person, place, and time.  Skin: Skin is warm and dry.  Psychiatric: She has a normal mood and affect. Her behavior is normal. Judgment and thought content normal.      Assessment & Plan:  1. Hypertension, unspecified type -Continue Amlodipine 5 mg once daily  -Continue hydrochlorothiazide 6.25 mg once daily  2. Chronic left-sided back pain, unspecified back location -Meloxicam 7.5-15 mg once daily as needed.  3. Aortic aneurysm without rupture, unspecified portion of aorta [facility]) -Per patient CT scan scheduled, 12/01/16   Carroll Sage. Kenton Kingfisher, MSN, FNP-C [facility] [place]

## 2016-11-06 NOTE — Patient Instructions (Addendum)
Take Meloxicam 7.5-15 mg (1/2-1 tablet) daily as needed for left sided back pain.    IF you received an x-ray today, you will receive an invoice from [facility]. Please contact [facility] at [phone] with questions or concerns regarding your invoice.   IF you received labwork today, you will receive an invoice from [facility]. Please contact [facility] at [phone] with questions or concerns regarding your invoice.   Our billing staff will not be able to assist you with questions regarding bills from these companies.  You will be contacted with the lab results as soon as they are available. The fastest way to get your results is to activate your My Chart account. Instructions are located on the last page of this paperwork. If you have not heard from [place] regarding the results in 2 weeks, please contact this office.

## 2016-11-10 ENCOUNTER — Other Ambulatory Visit: Payer: BC Managed Care – PPO

## 2016-11-10 ENCOUNTER — Ambulatory Visit: Payer: BC Managed Care – PPO | Admitting: Cardiothoracic Surgery

## 2016-11-24 ENCOUNTER — Ambulatory Visit: Payer: BC Managed Care – PPO | Admitting: Cardiothoracic Surgery

## 2016-11-29 ENCOUNTER — Ambulatory Visit (INDEPENDENT_AMBULATORY_CARE_PROVIDER_SITE_OTHER): Payer: BC Managed Care – PPO | Admitting: Gynecology

## 2016-11-29 ENCOUNTER — Encounter: Payer: BC Managed Care – PPO | Admitting: Gynecology

## 2016-11-29 ENCOUNTER — Other Ambulatory Visit: Payer: Self-pay | Admitting: Gynecology

## 2016-11-29 ENCOUNTER — Encounter: Payer: Self-pay | Admitting: Gynecology

## 2016-11-29 VITALS — BP 146/80 | Ht 63.0 in | Wt 181.0 lb

## 2016-11-29 DIAGNOSIS — Z01419 Encounter for gynecological examination (general) (routine) without abnormal findings: Secondary | ICD-10-CM

## 2016-11-29 DIAGNOSIS — Z1231 Encounter for screening mammogram for malignant neoplasm of breast: Secondary | ICD-10-CM

## 2016-11-29 NOTE — Progress Notes (Signed)
Megan Lang [birth year] [id number]   History:    46 y.o.  for annual gyn exam with no complaints today. She had the Nexplanon placed in October 2017 occasionally she might have some spotting but otherwise has done well with it. Patient's flu vaccine is up-to-date. Patient previously been evaluated by the gastroenterologist whereby endoscopy and colonoscopy had been done. Colon polyps were identified and she does have gastroesophageal reflux disease.Patient's mother had died of some form of GI malignancy  Also patient  had been followed by the cardiothoracic surgeon Dr. Tharon Aquas Trist as a result of a mild fusiform ascending aneurysm that had been noted on CT scan which had been ordered in the ED. 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 Risk of dissection currently is less than 1% per year  Treatment recommendations--blood pressure control is the most important aspect of this patient's risk profile for dissection. This was discussed with the patient she was recommended to check her blood pressure frequently and to keep her blood pressure less than 150/90.  Serial surveillance scans of her ascending aorta are recommended and she will be scheduled for a CT scan with contrast in one year. Symptoms of acute dissection including severe tearing anterior precordial or posterior interscapular pain should be immediately evaluated at closest emergency room.with repeat CT scan."  Patient scheduled to see cardiothoracic surgeon this week after her CT scan.    Past medical history,surgical history, family history and social history were all reviewed and documented in the EPIC chart.  Gynecologic History No LMP recorded. Patient has had an implant. Contraception: Nexplanon Last Pap: 2016. Results were: normal Last mammogram: 2017. Results were: Normal but dense had three-dimensional  mammogram  Obstetric History OB History  Gravida Para Term Preterm AB Living  3 3 3     3   SAB TAB Ectopic Multiple Live Births          3    # Outcome Date GA Lbr Len/2nd Weight Sex Delivery Anes PTL Lv  3 Term     F Vag-Spont  N LIV  2 Term     F Vag-Spont  N LIV  1 Term     F Vag-Spont  N LIV       ROS: A ROS was performed and pertinent positives and negatives are included in the history.  GENERAL: No fevers or chills. HEENT: No change in vision, no earache, sore throat or sinus congestion. NECK: No pain or stiffness. CARDIOVASCULAR: No chest pain or pressure. No palpitations. PULMONARY: No shortness of breath, cough or wheeze. GASTROINTESTINAL: No abdominal pain, nausea, vomiting or diarrhea, melena or bright red blood per rectum. GENITOURINARY: No urinary frequency, urgency, hesitancy or dysuria. MUSCULOSKELETAL: No joint or muscle pain, no back pain, no recent trauma. DERMATOLOGIC: No rash, no itching, no lesions. ENDOCRINE: No polyuria, polydipsia, no heat or cold intolerance. No recent change in weight. HEMATOLOGICAL: No anemia or easy bruising or bleeding. NEUROLOGIC: No headache, seizures, numbness, tingling or weakness. PSYCHIATRIC: No depression, no loss of interest in normal activity or change in sleep pattern.     Exam: chaperone present  BP (!) 146/80   Ht 5\' 3"  (1.6 m)   Wt 181 lb (82.1 kg)   BMI 32.06 kg/m   Body mass index is 32.06 kg/m.  General appearance : Well developed well nourished female. No acute distress HEENT: Eyes: no retinal hemorrhage or exudates,  Neck supple, trachea midline, no carotid bruits, no thyroidmegaly Lungs: Clear to auscultation, no rhonchi or wheezes, or rib retractions  Heart: Regular rate and rhythm, no murmurs or gallops Breast:Examined in sitting and supine position were symmetrical in appearance, no palpable masses or tenderness,  no skin retraction, no nipple inversion, no nipple discharge, no skin discoloration, no axillary or  supraclavicular lymphadenopathy Abdomen: no palpable masses or tenderness, no rebound or guarding Extremities: no edema or skin discoloration or tenderness  Pelvic:  Bartholin, Urethra, Skene Glands: Within normal limits             Vagina: No gross lesions or discharge  Cervix: No gross lesions or discharge  Uterus  anteverted, normal size, shape and consistency, non-tender and mobile  Adnexa  Without masses or tenderness  Anus and perineum  normal   Rectovaginal  normal sphincter tone without palpated masses or tenderness             Hemoccult colonoscopy within one year    Assessment/Plan:  46 y.o. female for annual exam doing well with the Nexplanon for contraception. PCP has been doing her blood work. Flu vaccine up-to-date. Patient with history of colon polyps on 5 year recall. We discussed importance of calcium vitamin D and weightbearing exercises for osteoporosis prevention. No Pap smear done today according to do guidelines.    Terrance Mass MD, 9:47 AM 11/29/2016

## 2016-12-01 ENCOUNTER — Other Ambulatory Visit: Payer: BC Managed Care – PPO

## 2016-12-01 ENCOUNTER — Ambulatory Visit: Payer: BC Managed Care – PPO | Admitting: Cardiothoracic Surgery

## 2016-12-15 ENCOUNTER — Encounter: Payer: Self-pay | Admitting: Cardiothoracic Surgery

## 2016-12-15 ENCOUNTER — Ambulatory Visit (INDEPENDENT_AMBULATORY_CARE_PROVIDER_SITE_OTHER): Payer: BC Managed Care – PPO | Admitting: Cardiothoracic Surgery

## 2016-12-15 ENCOUNTER — Ambulatory Visit: Payer: BC Managed Care – PPO

## 2016-12-15 ENCOUNTER — Ambulatory Visit
Admission: RE | Admit: 2016-12-15 | Discharge: 2016-12-15 | Disposition: A | Discharge status: Home / Self Care | Payer: BC Managed Care – PPO | Source: Ambulatory Visit | Attending: Cardiothoracic Surgery | Admitting: Cardiothoracic Surgery

## 2016-12-15 VITALS — BP 148/93 | HR 93 | Resp 16 | Ht 63.0 in | Wt 181.0 lb

## 2016-12-15 DIAGNOSIS — Z8679 Personal history of other diseases of the circulatory system: Secondary | ICD-10-CM

## 2016-12-15 DIAGNOSIS — I712 Thoracic aortic aneurysm, without rupture, unspecified: Secondary | ICD-10-CM

## 2016-12-15 DIAGNOSIS — Z9889 Other specified postprocedural states: Secondary | ICD-10-CM

## 2016-12-15 MED ORDER — IOPAMIDOL (ISOVUE-370) INJECTION 76%: 75.0000 mL | Freq: Once | INTRAVENOUS | Status: DC | PRN

## 2016-12-15 NOTE — Progress Notes (Signed)
PCP is Molli Barrows, FNP Referring Provider is Copland, Gay Filler, MD  Chief Complaint  Patient presents with  . Thoracic Aortic Aneurysm    1 year f/u with CTA Chest   Patient examined, CTA of thoracic aorta performed today personally reviewed and counseled with patient   HPI: 46 year old overweight hypertensive AA female returns for one year follow up with CTA of the thoracic aorta. She was diagnosed with a 4.1 cm fusiform ascending aneurysm one year ago. She presented to the emergency department with atypical chest pain after she was upset at work. This showed moderate dilatation but without dissection or hematoma.  The patient states she is compliant with her antihypertensive medication-Norvasc 5 mg daily. She checks her blood pressure at home with a digital cuff almost daily.  Since her last visit she has had no symptoms of aortic related chest pain or dissection. She is a nonsmoker.  Her blood pressure has remained less than Q000111Q systolic.  Today her CT scan shows no change in the fusiform ascending aneurysm which measures 4.1 cm diameter. There is no evidence of intramural hematoma or penetrating ulcer. The arch and descending thoracic aorta are of normal diameter. There are no suspicious pulmonary nodules or adenopathy.   Past Medical History:  Diagnosis Date  . Acid reflux   . Allergy   . Aortic aneurysm ([place])   . Asthma   . Cardiomegaly   . Carpal tunnel syndrome   . Gastric polyp   . GERD (gastroesophageal reflux disease) 01/23/2016  . Hiatal hernia   . Hypertension   . Normal spontaneous vaginal delivery    3    Past Surgical History:  Procedure Laterality Date  . CARPAL TUNNEL RELEASE  2009  . CARPAL TUNNEL RELEASE Left   . COLONOSCOPY    . NASAL SINUS SURGERY     DEC 15,2016    Family History  Problem Relation Age of Onset  . Hypertension Mother   . Kidney disease Father   . Liver disease Father     Unknown diagnosis  . Breast cancer Maternal  Grandmother   . Hypertension Sister   . Hypertension Brother     Social History Social History  Substance Use Topics  . Smoking status: Never Smoker  . Smokeless tobacco: Never Used  . Alcohol use No    Current Outpatient Prescriptions  Medication Sig Dispense Refill  . acetaminophen (TYLENOL) 500 MG tablet Take 500 mg by mouth.    Marland Kitchen albuterol (PROVENTIL HFA;VENTOLIN HFA) 108 (90 Base) MCG/ACT inhaler Inhale 1-2 puffs into the lungs every 6 (six) hours as needed for wheezing or shortness of breath. 1 Inhaler 0  . amLODipine (NORVASC) 5 MG tablet Take 1 tablet (5 mg total) by mouth daily. 90 tablet 3  . carboxymethylcellulose (REFRESH PLUS) 0.5 % SOLN Place 1 drop into both eyes 3 (three) times daily as needed (for eye irritation).    Marland Kitchen etonogestrel (NEXPLANON) 68 MG IMPL implant 1 each by Subdermal route once.    . fluticasone (FLONASE) 50 MCG/ACT nasal spray Place into the nose.    . hydrochlorothiazide (HYDRODIURIL) 12.5 MG tablet Take 0.5 tablets (6.25 mg total) by mouth daily. 90 tablet 3  . ibuprofen (ADVIL,MOTRIN) 800 MG tablet     . meloxicam (MOBIC) 15 MG tablet Take 0.5-1 tablets (7.5-15 mg total) by mouth daily as needed for pain. 30 tablet 1  . Multiple Vitamin (THERA) TABS Take by mouth.    . Olopatadine HCl (PAZEO) 0.7 % SOLN  Apply to eye.    Marland Kitchen omeprazole (PRILOSEC) 40 MG capsule Take 1 capsule (40 mg total) by mouth once. 60 capsule 4   No current facility-administered medications for this visit.    Facility-Administered Medications Ordered in Other Visits  Medication Dose Route Frequency Provider Last Rate Last Dose  . iopamidol (ISOVUE-370) 76 % injection 75 mL  75 mL Intravenous Once PRN Ivin Poot, MD        Allergies  Allergen Reactions  . Clindamycin Diarrhea and Other (See Comments)    GI upset  . Codeine Other (See Comments)    TACHYCARDIA   . Levofloxacin Other (See Comments)    other  . Metronidazole Other (See Comments)    Other  . Minocin  [Minocycline Hcl] Other (See Comments)    Stomach Pains  . Moxifloxacin Swelling    other  . Penicillins Rash    Has patient had a PCN reaction causing immediate rash, facial/tongue/throat swelling, SOB or lightheadedness with hypotension:yes Has patient had a PCN reaction causing severe rash involving mucus membranes or skin necrosis:no Has patient had a PCN reaction that required hospitalization:no Has patient had a PCN reaction occurring within the last 10 years:No If all of the above answers are "NO", then may proceed with Cephalosporin    Review of Systems  The patient denies chest pain Patient has been other with nasal congestion and upper chest congestion this winter The patient has not been to the emergency department in the recent past No change in vision or neurologic symptoms No ankle edema No orthopnea or PND She had a echocardiogram in 2017 showing normal LV function, normal aortic valve structure and function   BP (!) 148/93 (BP Location: Left Arm, Patient Position: Sitting, Cuff Size: Large)   Pulse 93   Resp 16   Ht 5\' 3"  (1.6 m)   Wt 181 lb (82.1 kg)   SpO2 98% Comment: RA  BMI 32.06 kg/m  Physical Exam      Exam    General- alert and comfortable   Lungs- clear without rales, wheezes   Cor- regular rate and rhythm, no murmur , gallop   Abdomen- soft, non-tender   Extremities - warm, non-tender, minimal edema   Neuro- oriented, appropriate, no focal weakness  Diagnostic Tests: CT scan shows moderate ascending fusiform aneurysm, no change in the past year. 4.1cm  -4.2 cm diameter Risk for aortic dissection remains extremely low at current status Surgery recommended for diameter at 5.5 cm or greater Most important therapy to prevent dissection is good blood pressure control  Impression: Continue Norvasc 5 mg daily If blood pressure starts to increase greater than Q000111Q mmHg systolic then adding metoprolol 25 mg twice a day would be recommended or  consideration of adding losartan for diastolic pressure increasing  above 90  Plan:Return in one year for CTA of the thoracic aorta   Len Childs, MD [facility] [phone]

## 2016-12-16 ENCOUNTER — Ambulatory Visit (INDEPENDENT_AMBULATORY_CARE_PROVIDER_SITE_OTHER): Payer: BC Managed Care – PPO | Admitting: Family Medicine

## 2016-12-16 ENCOUNTER — Encounter: Payer: Self-pay | Admitting: Family Medicine

## 2016-12-16 VITALS — BP 142/86 | HR 104 | Temp 98.6°F | Resp 16 | Ht 63.0 in | Wt 181.0 lb

## 2016-12-16 DIAGNOSIS — R0981 Nasal congestion: Secondary | ICD-10-CM | POA: Diagnosis not present

## 2016-12-16 MED ORDER — IPRATROPIUM BROMIDE 0.03 % NA SOLN
2.0000 | Freq: Two times a day (BID) | NASAL | 0 refills | Status: DC
Start: 2016-12-16 — End: 2017-05-31

## 2016-12-16 MED ORDER — AZITHROMYCIN 250 MG PO TABS
ORAL_TABLET | ORAL | 0 refills | Status: DC
Start: 2016-12-16 — End: 2017-02-19

## 2016-12-16 NOTE — Progress Notes (Signed)
    SUBJECTIVE:  Megan Lang is a 46 y.o. female who complains of congestion for 1 week days. She denies a history of chest pain, chills, dizziness, fatigue, fevers, nausea, shortness of breath and vomiting and denies a history of asthma. Patient does not smoke cigarettes. No wheezing or shortness . Cough started as non productive and is now productive, with watery eyes and facial pressure. She reports a scratchy throat. She has taken and OTC allergy medication.     Social History   Social History  . Marital status: Married    Spouse name: N/A  . Number of children: 3  . 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 Smoking status: Never Smoker  . Smokeless tobacco: Never Used  . Alcohol use No  . Drug use: No  . 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 Not on file   Social History Narrative   Epworth Sleepiness Scale = 8 (as of 10/28/2015)    Past Medical History:  Diagnosis Date  . Acid reflux   . Allergy   . Aortic aneurysm ([place])   . Asthma   . Cardiomegaly   . Carpal tunnel syndrome   . Gastric polyp   . GERD (gastroesophageal reflux disease) 01/23/2016  . Hiatal hernia   . Hypertension   . Normal spontaneous vaginal delivery    3    OBJECTIVE: Blood pressure (!) 142/86, pulse (!) 104, temperature 98.6 F (37 C), temperature source Oral, resp. rate 16, height 5\' 3"  (1.6 m), weight 181 lb (82.1 kg), SpO2 98 %.  She appears well, vital signs are as noted. Ears normal.  Throat and pharynx normal.  Neck supple. No adenopathy in the neck. Nose is congested. Sinuses non tender. The chest is clear, without wheezes or rales. She has rhonchi bilaterally upper posteriorly chest wall. maxillary tenderness with bilateral mucosal edema. Heart sounds regular rate and rhythm.  ASSESSMENT:   1. Sinus congestion  PLAN:  Symptomatic therapy suggested: push  fluids, rest and return office visit prn if symptoms persist or worsen. Lack of antibiotic effectiveness discussed with her.   Start Mucinex DM 1200 mg twice daily x 7-10 days Ipratropium (Atrovent) 2 sprays, twice daily as needed for nasal congestion.  If symptoms have not improved after 4 days, you may fill prescription for Azithromycin and start azithromycin Take 2 tabs x 1 dose, then 1 tab every day for x 4 days.   Call or return to clinic prn if these symptoms worsen or fail to improve as anticipated.    Carroll Sage. Kenton Kingfisher, MSN, FNP-C [facility] [place]

## 2016-12-16 NOTE — Patient Instructions (Addendum)
Start Mucinex DM 1200 mg twice daily x 7-10 days Ipratropium (Atrovent) 2 sprays, twice daily as needed for nasal congestion.  If symptoms have not improved after 4 days, you may fill prescription for Azithromycin and start azithromycin Take 2 tabs x 1 dose, then 1 tab every day for x 4 days.     IF you received an x-ray today, you will receive an invoice from [facility]. Please contact [facility] at [phone] with questions or concerns regarding your invoice.   IF you received labwork today, you will receive an invoice from [facility]. Please contact [facility] at [phone] with questions or concerns regarding your invoice.   Our billing staff will not be able to assist you with questions regarding bills from these companies.  You will be contacted with the lab results as soon as they are available. The fastest way to get your results is to activate your My Chart account. Instructions are located on the last page of this paperwork. If you have not heard from [place] regarding the results in 2 weeks, please contact this office.     Upper Respiratory Infection, Adult Most upper respiratory infections (URIs) are caused by a virus. A URI affects the nose, throat, and upper air passages. The most common type of URI is often called "the common cold." Follow these instructions at home:  Take medicines only as told by your doctor.  Gargle warm saltwater or take cough drops to comfort your throat as told by your doctor.  Use a warm mist humidifier or inhale steam from a shower to increase air moisture. This may make it easier to breathe.  Drink enough fluid to keep your pee (urine) clear or pale yellow.  Eat soups and other clear broths.  Have a healthy diet.  Rest as needed.  Go back to work when your fever is gone or your doctor says it is okay.  You may need to stay home longer to avoid giving your URI to others.  You can also wear a face mask and wash your hands  often to prevent spread of the virus.  Use your inhaler more if you have asthma.  Do not use any tobacco products, including cigarettes, chewing tobacco, or electronic cigarettes. If you need help quitting, ask your doctor. Contact a doctor if:  You are getting worse, not better.  Your symptoms are not helped by medicine.  You have chills.  You are getting more short of breath.  You have brown or red mucus.  You have yellow or brown discharge from your nose.  You have pain in your face, especially when you bend forward.  You have a fever.  You have puffy (swollen) neck glands.  You have pain while swallowing.  You have white areas in the back of your throat. Get help right away if:  You have very bad or constant:  Headache.  Ear pain.  Pain in your forehead, behind your eyes, and over your cheekbones (sinus pain).  Chest pain.  You have long-lasting (chronic) lung disease and any of the following:  Wheezing.  Long-lasting cough.  Coughing up blood.  A change in your usual mucus.  You have a stiff neck.  You have changes in your:  Vision.  Hearing.  Thinking.  Mood. This information is not intended to replace advice given to you by your health care provider. Make sure you discuss any questions you have with your health care provider. Document Released: 03/22/2008 Document Revised: 06/06/2016 Document Reviewed:  01/09/2014 Elsevier Interactive Patient Education  2017 [employer].

## 2016-12-29 ENCOUNTER — Ambulatory Visit
Admission: RE | Admit: 2016-12-29 | Discharge: 2016-12-29 | Disposition: A | Discharge status: Home / Self Care | Payer: BC Managed Care – PPO | Source: Ambulatory Visit | Attending: Gynecology | Admitting: Gynecology

## 2016-12-29 DIAGNOSIS — Z1231 Encounter for screening mammogram for malignant neoplasm of breast: Secondary | ICD-10-CM

## 2017-02-03 ENCOUNTER — Other Ambulatory Visit: Payer: Self-pay | Admitting: Family Medicine

## 2017-02-19 ENCOUNTER — Ambulatory Visit (INDEPENDENT_AMBULATORY_CARE_PROVIDER_SITE_OTHER): Payer: BC Managed Care – PPO | Admitting: Family Medicine

## 2017-02-19 ENCOUNTER — Encounter: Payer: Self-pay | Admitting: Family Medicine

## 2017-02-19 ENCOUNTER — Ambulatory Visit (INDEPENDENT_AMBULATORY_CARE_PROVIDER_SITE_OTHER): Payer: BC Managed Care – PPO

## 2017-02-19 VITALS — BP 132/88 | HR 95 | Temp 98.3°F | Resp 18 | Ht 63.0 in | Wt 179.2 lb

## 2017-02-19 DIAGNOSIS — S29019A Strain of muscle and tendon of unspecified wall of thorax, initial encounter: Secondary | ICD-10-CM | POA: Diagnosis not present

## 2017-02-19 DIAGNOSIS — R109 Unspecified abdominal pain: Secondary | ICD-10-CM

## 2017-02-19 DIAGNOSIS — G8929 Other chronic pain: Secondary | ICD-10-CM

## 2017-02-19 DIAGNOSIS — I10 Essential (primary) hypertension: Secondary | ICD-10-CM

## 2017-02-19 LAB — POCT URINALYSIS DIP (MANUAL ENTRY)
BILIRUBIN UA: NEGATIVE
Blood, UA: NEGATIVE
GLUCOSE UA: NEGATIVE mg/dL
Ketones, POC UA: NEGATIVE mg/dL
Leukocytes, UA: NEGATIVE
NITRITE UA: NEGATIVE
SPEC GRAV UA: 1.025 (ref 1.010–1.025)
UROBILINOGEN UA: 0.2 U/dL
pH, UA: 6 (ref 5.0–8.0)

## 2017-02-19 LAB — POC MICROSCOPIC URINALYSIS (UMFC)

## 2017-02-19 MED ORDER — DICLOFENAC EPOLAMINE 1.3 % TD PTCH: 1.0000 | MEDICATED_PATCH | Freq: Two times a day (BID) | TRANSDERMAL | 1 refills | Status: DC

## 2017-02-19 MED ORDER — CYCLOBENZAPRINE HCL 5 MG PO TABS: 5.0000 mg | ORAL_TABLET | Freq: Three times a day (TID) | ORAL | 1 refills | Status: DC | PRN

## 2017-02-19 NOTE — Patient Instructions (Addendum)
IF you received an x-ray today, you will receive an invoice from [facility]. Please contact [facility] at [phone] with questions or concerns regarding your invoice.   IF you received labwork today, you will receive an invoice from [facility]. Please contact [facility] at [phone] with questions or concerns regarding your invoice.   Our billing staff will not be able to assist you with questions regarding bills from these companies.  You will be contacted with the lab results as soon as they are available. The fastest way to get your results is to activate your My Chart account. Instructions are located on the last page of this paperwork. If you have not heard from [place] regarding the results in 2 weeks, please contact this office.    Mid-Back Strain A strain is a stretch or tear in a muscle or the strong cords of tissue that attach muscle to bone (tendons). Strains of the mid-back (thoracic spine) occur between the neck and the lower back. A strain occurs when muscles or tendons are torn or are stretched beyond their limits. The muscles may become inflamed, resulting in pain and sudden muscle tightening (spasms). A strain can happen suddenly due to an injury (trauma), or it can develop gradually due to overuse. There are three types of strains:  Grade 1 is a mild strain involving a minor tear of the muscle fibers or tendons. This may cause some pain but no loss of muscle strength.  Grade 2 is a moderate strain involving a partial tear of the muscle fibers or tendons. This causes more severe pain and some loss of muscle strength.  Grade 3 is a severe strain involving a complete tear of the muscle or tendon. This causes severe pain and complete or nearly complete loss of muscle strength. What are the causes? This condition may be caused by:  Trauma, such as a fall or a hit to the body.  Twisting or overstretching the back. This may result from doing activities that  require a lot of energy, such as lifting heavy objects. What increases the risk? The following factors may increase your risk of getting this condition:  Playing contact sports.  Playing sports that put stress on the middle back, including:  Gymnastics.  Rowing.  Golf.  Horseback riding. What are the signs or symptoms? Symptoms of this condition may include:  Sharp, dull, or spreading pain in the middle back that does not go away.  Stiffness.  Limited range of motion.  Inability to stand up straight due to stiffness or pain.  Muscle spasms. How is this diagnosed?   This condition may be diagnosed based on:  Your symptoms.  Your medical history.  A physical exam.  Your health care provider may push on certain areas of your back to determine the source of your pain.  You may be asked to bend forward, backward, and side to side to assess the severity of your pain and your range of motion.  Imaging tests, such as:  X-rays.  MRI. How is this treated? Treatment for this condition may include:  Applying heat and cold to the affected area.  Medicines to help relieve pain and to relax your muscles (muscle relaxants).  NSAIDs to help reduce swelling and discomfort.  Physical therapy. When your symptoms improve, it is important to gradually return to your normal routine as soon as possible to reduce pain, avoid stiffness, and avoid loss of muscle strength. Generally, symptoms should improve within 6 weeks of  treatment. However, recovery time varies. Follow these instructions at home: Managing pain, stiffness, and swelling   If directed, apply ice to the injured area during the first 24 hours after your injury.  Put ice in a plastic bag.  Place a towel between your skin and the bag.  Leave the ice on for 20 minutes, 2-3 times a day.  If directed, apply heat to the affected area as often as told by your health care provider. Use the heat source that your health  care provider recommends, such as a moist heat pack or a heating pad.  Place a towel between your skin and the heat source.  Leave the heat on for 20-30 minutes.  Remove the heat if your skin turns bright red. This is especially important if you are unable to feel pain, heat, or cold. You may have a greater risk of getting burned. Activity   Rest and return to your normal activities as told by your health care provider. Ask your health care provider what activities are safe for you.  Avoid activities that take a lot of effort (are strenuous) for as long as told by your health care provider.  Do exercises as told by your health care provider. General instructions    Take over-the-counter and prescription medicines only as told by your health care provider.  If you have questions or concerns about safety while taking pain medicine, talk with your health care provider.  Do not drive or operate heavy machinery until you know how your pain medicine affects you.  Do not use any tobacco products, such as cigarettes, chewing tobacco, and e-cigarettes. Tobacco can delay bone healing. If you need help quitting, ask your health care provider.  Keep all follow-up visits as told by your health care provider. This is important. How is this prevented?  Warm up and stretch before being active.  Cool down and stretch after being active.  Give your body time to rest between periods of activity.  Avoid:  Being physically inactive for long periods at a time.  Exercising or playing sports when you are tired or in pain.  Use correct form when playing sports and lifting heavy objects.  Use good posture when sitting and standing.  Maintain a healthy weight.  Sleep on a mattress with medium firmness to support your back.  Make sure to use equipment that fits you, including shoes that fit well.  Be safe and responsible while being active to avoid falls.  Do at least 150 minutes of  moderate-intensity exercise each week, such as brisk walking or water aerobics. Try a form of exercise that takes stress off your back, such as swimming or stationary cycling.  Maintain physical fitness, including:  Strength.  Flexibility.  Cardiovascular fitness.  Endurance. Contact a health care provider if:  Your back pain does not improve after 6 weeks of treatment.  Your symptoms get worse. Get help right away if:  Your back pain is severe.  You are unable to stand or walk.  You develop pain in your legs.  You develop weakness in your buttocks or legs.  You have difficulty controlling when you urinate or when you have a bowel movement. This information is not intended to replace advice given to you by your health care provider. Make sure you discuss any questions you have with your health care provider. Document Released: 10/04/2005 Document Revised: 06/10/2016 Document Reviewed: 07/16/2015 Elsevier Interactive Patient Education  2017 [place].   Mid-Back Strain Rehab  Ask your health care provider which exercises are safe for you. Do exercises exactly as told by your health care provider and adjust them as directed. It is normal to feel mild stretching, pulling, tightness, or discomfort as you do these exercises, but you should stop right away if you feel sudden pain or your pain gets worse. Do not begin these exercises until told by your health care provider. Stretching and range of motion exercises This exercise warms up your muscles and joints and improves the movement and flexibility of your back and shoulders. This exercise also help to relieve pain. Exercise A: Chest and spine stretch   1. Lie down on your back on a firm surface. 2. Roll a towel or a small blanket so it is about 4 inches (10 cm) in diameter. 3. Put the towel lengthwise under the middle of your back so it is under your spine, but not under your shoulder blades. 4. To increase the stretch, you  may put your hands behind your head and let your elbows fall to your sides. 5. Hold for __________ seconds. Repeat exercise __________ times. Complete this exercise __________ times a day. Strengthening exercises These exercises build strength and endurance in your back and your shoulder blade muscles. Endurance is the ability to use your muscles for a long time, even after they get tired. Exercise B: Alternating arm and leg raises   1. Get on your hands and knees on a firm surface. If you are on a hard floor, you may want to use padding to cushion your knees, such as an exercise mat. 2. Line up your arms and legs. Your hands should be below your shoulders, and your knees should be below your hips. 3. Lift your left leg behind you. At the same time, raise your right arm and straighten it in front of you.  Do not lift your leg higher than your hip.  Do not lift your arm higher than your shoulder.  Keep your abdominal and back muscles tight.  Keep your hips facing the ground.  Do not arch your back.  Keep your balance carefully, and do not hold your breath. 4. Hold for __________ seconds. 5. Slowly return to the starting position and repeat with your right leg and your left arm. Repeat __________ times. Complete this exercise __________ times a day. Exercise C: Straight arm rows (  shoulder extension) 1. Stand with your feet shoulder width apart. 2. Secure an exercise band to a stable object in front of you so the band is at or above shoulder height. 3. Hold one end of the exercise band in each hand. 4. Straighten your elbows and lift your hands up to shoulder height. 5. Step back, away from the secured end of the exercise band, until the band stretches. 6. Squeeze your shoulder blades together and pull your hands down to the sides of your thighs. Stop when your hands are straight down by your sides. Do not let your hands go behind your body. 7. Hold for __________ seconds. 8. Slowly  return to the starting position. Repeat __________ times. Complete this exercise __________ times a day. Exercise D: Shoulder external rotation, prone 1. Lie on your abdomen on a firm bed so your left / right forearm hangs over the edge of the bed and your upper arm is on the bed, straight out from your body.  Your elbow should be bent.  Your palm should be facing your feet. 2. If instructed, hold a __________  weight in your hand. 3. Squeeze your shoulder blade toward the middle of your back. Do not let your shoulder lift toward your ear. 4. Keep your elbow bent in an "L" shape (90 degrees) while you slowly move your forearm up toward the ceiling. Move your forearm up to the height of the bed, toward your head.  Your upper arm should not move.  At the top of the movement, your palm should face the floor. 5. Hold for __________ seconds. 6. Slowly return to the starting position and relax your muscles. Repeat __________ times. Complete this exercise __________ times a day. Exercise E: Scapular retraction and external rotation, rowing  1. Sit in a stable chair without armrests, or stand. 2. Secure an exercise band to a stable object in front of you so it is at shoulder height. 3. Hold one end of the exercise band in each hand. 4. Bring your arms out straight in front of you. 5. Step back, away from the secured end of the exercise band, until the band stretches. 6. Pull the band backward. As you do this, bend your elbows and squeeze your shoulder blades together, but avoid letting the rest of your body move. Do not let your shoulders lift up toward your ears. 7. Stop when your elbows are at your sides or slightly behind your body. 8. Hold for __________ seconds. 9. Slowly straighten your arms to return to the starting position. Repeat __________ times. Complete this exercise __________ times a day. Posture and body mechanics  Body mechanics refers to the movements and positions of your  body while you do your daily activities. Posture is part of body mechanics. Good posture and healthy body mechanics can help to relieve stress in your body's tissues and joints. Good posture means that your spine is in its natural S-curve position (your spine is neutral), your shoulders are pulled back slightly, and your head is not tipped forward. The following are general guidelines for applying improved posture and body mechanics to your everyday activities. Standing   When standing, keep your spine neutral and your feet about hip-width apart. Keep a slight bend in your knees. Your ears, shoulders, and hips should line up.  When you do a task in which you lean forward while standing in one place for a long time, place one foot up on a stable object that is 2-4 inches (5-10 cm) high, such as a footstool. This helps keep your spine neutral. Sitting   When sitting, keep your spine neutral and keep your feet flat on the floor. Use a footrest, if necessary, and keep your thighs parallel to the floor. Avoid rounding your shoulders, and avoid tilting your head forward.  When working at a desk or a computer, keep your desk at a height where your hands are slightly lower than your elbows. Slide your chair under your desk so you are close enough to maintain good posture.  When working at a computer, place your monitor at a height where you are looking straight ahead and you do not have to tilt your head forward or downward to look at the screen. Resting  When lying down and resting, avoid positions that are most painful for you.  If you have pain with activities such as sitting, bending, stooping, or squatting (flexion-based activities), lie in a position in which your body does not bend very much. For example, avoid curling up on your side with your arms and knees near your chest (fetal position).  If  you have pain with activities such as standing for a long time or reaching with your arms  (extension-based activities), lie with your spine in a neutral position and bend your knees slightly. Try the following positions:  Lying on your side with a pillow between your knees.  Lying on your back with a pillow under your knees. Lifting   When lifting objects, keep your feet at least shoulder-width apart and tighten your abdominal muscles.  Bend your knees and hips and keep your spine neutral. It is important to lift using the strength of your legs, not your back. Do not lock your knees straight out.  Always ask for help to lift heavy or awkward objects. This information is not intended to replace advice given to you by your health care provider. Make sure you discuss any questions you have with your health care provider. Document Released: 10/04/2005 Document Revised: 06/10/2016 Document Reviewed: 07/16/2015 Elsevier Interactive Patient Education  2017 [employer].

## 2017-02-19 NOTE — Progress Notes (Addendum)
Subjective:  By signing my name below, I, Moises Blood, attest that this documentation has been prepared under the direction and in the presence of Delman Cheadle, MD. Electronically Signed: Moises Blood, [place]. 02/19/2017 , 8:24 AM .  Patient was seen in Room 11 .   Patient ID: Megan Lang, female    DOB: June 15, 1971, 46 y.o.   MRN: [id number] Chief Complaint  Patient presents with  . Back Pain    back muscle is swollen and tender x3 months    HPI Megan Lang is a 46 y.o. female who presents to Primary Care at [facility] complaining of back pain with swelling over the area ongoing intermittently for several months now. She informs initially noticed after lifting a bed while moving back in Sept 2017. She states lack of movement improves her pain. She was previously prescribed an NSAID by Molli Barrows, NP, but it caused her to have an upset stomach. She reports specific movements, like twisting, causes the area to worsen. Her massage therapist tried to massage the area, and it also worsened the pain. She notes history of bad acid reflux. She denies urinary, bowel symptoms or abdominal pain.   Has Nexplanon  BP at home 120s/80s.  Will try to drink more water.  Past Medical History:  Diagnosis Date  . Acid reflux   . Allergy   . Aortic aneurysm ([place])   . Asthma   . Cardiomegaly   . Carpal tunnel syndrome   . Gastric polyp   . GERD (gastroesophageal reflux disease) 01/23/2016  . Hiatal hernia   . Hypertension   . Normal spontaneous vaginal delivery    3   Prior to Admission medications   Medication Sig Start Date End Date Taking? Authorizing Provider  acetaminophen (TYLENOL) 500 MG tablet Take 500 mg by mouth.    [provider]  albuterol (PROVENTIL HFA;VENTOLIN HFA) 108 (90 Base) MCG/ACT inhaler Inhale 1-2 puffs into the lungs every 6 (six) hours as needed for wheezing or shortness of breath. 09/03/16   Elnora Morrison, MD  amLODipine (NORVASC) 5 MG tablet Take 1  tablet (5 mg total) by mouth daily. 11/06/16   Scot Jun, FNP  azithromycin (ZITHROMAX) 250 MG tablet Take 2 tabs PO x 1 dose, then 1 tab PO QD x 4 days 12/16/16   Scot Jun, FNP  carboxymethylcellulose (REFRESH PLUS) 0.5 % SOLN Place 1 drop into both eyes 3 (three) times daily as needed (for eye irritation).    [provider]  etonogestrel (NEXPLANON) 68 MG IMPL implant 1 each by Subdermal route once.    [provider]  fluticasone (FLONASE) 50 MCG/ACT nasal spray Place into the nose.    [provider]  hydrochlorothiazide (HYDRODIURIL) 12.5 MG tablet Take 0.5 tablets (6.25 mg total) by mouth daily. 11/06/16   Scot Jun, FNP  hydrochlorothiazide (HYDRODIURIL) 12.5 MG tablet TAKE ONE-HALF TABLET BY MOUTH ONCE DAILY PT  NEEDS  TO  CALL  OFFICE  FOR  FOLLOW  UP 02/04/17   Leonie Douglas, PA-C  ibuprofen (ADVIL,MOTRIN) 800 MG tablet  10/28/15   [provider]  ipratropium (ATROVENT) 0.03 % nasal spray Place 2 sprays into both nostrils 2 (two) times daily. 12/16/16   Scot Jun, FNP  Multiple Vitamin (THERA) TABS Take by mouth.    [provider]  Olopatadine HCl (PAZEO) 0.7 % SOLN Apply to eye.    [provider]  omeprazole (PRILOSEC) 40 MG capsule Take 1  capsule (40 mg total) by mouth once. 05/26/16 12/15/16  Scot Jun, FNP   Allergies  Allergen Reactions  . Clindamycin Diarrhea and Other (See Comments)    GI upset  . Codeine Other (See Comments)    TACHYCARDIA   . Levofloxacin Other (See Comments)    other  . Metronidazole Other (See Comments)    Other  . Minocin [Minocycline Hcl] Other (See Comments)    Stomach Pains  . Moxifloxacin Swelling    other  . Penicillins Rash    Has patient had a PCN reaction causing immediate rash, facial/tongue/throat swelling, SOB or lightheadedness with hypotension:yes Has patient had a PCN reaction causing severe rash involving mucus membranes or skin  necrosis:no Has patient had a PCN reaction that required hospitalization:no Has patient had a PCN reaction occurring within the last 10 years:No If all of the above answers are "NO", then may proceed with Cephalosporin   Review of Systems  Constitutional: Negative for chills, fatigue, fever and unexpected weight change.  Respiratory: Negative for cough.   Gastrointestinal: Negative for abdominal pain, constipation, diarrhea, nausea and vomiting.  Genitourinary: Negative for dysuria.  Musculoskeletal: Positive for back pain.  Skin: Negative for rash and wound.  Neurological: Negative for dizziness, weakness and headaches.       Objective:   Physical Exam  Constitutional: She is oriented to person, place, and time. She appears well-developed and well-nourished. No distress.  HENT:  Head: Normocephalic and atraumatic.  Eyes: EOM are normal. Pupils are equal, round, and reactive to light.  Neck: Neck supple.  Cardiovascular: Normal rate.   Pulmonary/Chest: Effort normal. No respiratory distress.  Abdominal: She exhibits no mass. There is no hepatosplenomegaly. There is tenderness in the left lower quadrant. There is CVA tenderness (mild left CVA tenderness).  Musculoskeletal: Normal range of motion.  Under her left scapula, coming off the low thoracic around T10-12 region radiating to the left with a firm radicular muscle spasm, no point tenderness over cervical or thoracic spine, paraspinals non-tender to palpation  Neurological: She is alert and oriented to person, place, and time.  Skin: Skin is warm and dry.  Psychiatric: She has a normal mood and affect. Her behavior is normal.  Nursing note and vitals reviewed.   BP (!) 146/97   Pulse 95   Temp 98.3 F (36.8 C) (Oral)   Resp 18   Ht 5\' 3"  (1.6 m)   Wt 179 lb 3.2 oz (81.3 kg)   SpO2 100%   BMI 31.74 kg/m   BP 132/88 on recheck  Results for orders placed or performed in visit on 02/19/17  POCT urinalysis dipstick    Result Value Ref Range   Color, UA yellow yellow   Clarity, UA clear clear   Glucose, UA negative negative mg/dL   Bilirubin, UA negative negative   Ketones, POC UA negative negative mg/dL   Spec Grav, UA 1.025 1.010 - 1.025   Blood, UA negative negative   pH, UA 6.0 5.0 - 8.0   Protein Ur, POC trace (A) negative mg/dL   Urobilinogen, UA 0.2 0.2 or 1.0 E.U./dL   Nitrite, UA Negative Negative   Leukocytes, UA Negative Negative  POCT Microscopic Urinalysis ([facility])  Result Value Ref Range   WBC,UR,HPF,POC None None WBC/hpf   RBC,UR,HPF,POC None None RBC/hpf   Bacteria Few (A) None, Too numerous to count   Mucus Present (A) Absent   Epithelial Cells, UR Per Microscopy Few (A) None, Too numerous to count cells/hpf  Dg Thoracic Spine 2 View  Result Date: 02/19/2017 CLINICAL DATA:  repetitive strain with 6 mos of muscle swelling and pain on left upper lumbar/lower thoracic region EXAM: THORACIC SPINE 2 VIEWS COMPARISON:  Chest CT 12/15/2016.  Chest radiographs 09/03/2016. FINDINGS: Twelve rib-bearing thoracic type vertebral bodies. The alignment is normal aside from a minimal convex right scoliosis. There is no evidence of acute fracture, paraspinal hematoma or widening of the interpedicular distance. Scattered paraspinal osteophytes are present, largest on the right at T7-8. IMPRESSION: Stable thoracic spondylosis with near anatomic alignment. No acute osseous findings. Electronically Signed   By: Richardean Sale M.D.   On: 02/19/2017 09:33   Dg Lumbar Spine 2-3 Views  Result Date: 02/19/2017 CLINICAL DATA:  repetitive strain with 6 mos of muscle swelling and pain on left upper lumbar/lower thoracic region EXAM: LUMBAR SPINE - 2-3 VIEW COMPARISON:  Acute abdominal series done 10/18/2015. abdominopelvic CT 07/24/2015. FINDINGS: Five lumbar type vertebral bodies. The alignment is normal. The disc spaces are preserved. There is moderate facet hypertrophy inferiorly. No evidence of acute fracture or  pars defect. There are degenerative changes of the sacroiliac joints bilaterally with subchondral sclerosis. Multiple pelvic calcifications are similar to prior studies, likely all phleboliths. Vaginal tampon noted. IMPRESSION: Lower lumbar facet hypertrophy and sacroiliac degenerative changes bilaterally. No acute osseous findings. Electronically Signed   By: Richardean Sale M.D.   On: 02/19/2017 09:31       Assessment & Plan:   1. Left flank pain, chronic   2. Thoracic myofascial strain, initial encounter - progressively worsening for almost 8 mos - failed  meloxicam and other rx and otc nsaids with worsening of gastritis sxs  3. Essential hypertension - well controlled outside office, cont to monitor    Orders Placed This Encounter  Procedures  . DG Lumbar Spine 2-3 Views    Standing Status:   Future    Number of Occurrences:   1    Standing Expiration Date:   02/19/2018    Order Specific Question:   Reason for Exam (SYMPTOM  OR DIAGNOSIS REQUIRED)    Answer:   repetitive strain wiht 6 mos of muscle swelling and pain on left upper lumbar/lower thoracic region    Order Specific Question:   Is the patient pregnant?    Answer:   No    Order Specific Question:   Preferred imaging location?    Answer:   External  . DG Thoracic Spine 2 View    Standing Status:   Future    Number of Occurrences:   1    Standing Expiration Date:   02/19/2018    Order Specific Question:   Reason for Exam (SYMPTOM  OR DIAGNOSIS REQUIRED)    Answer:   repetitive strain wiht 6 mos of muscle swelling and pain on left upper lumbar/lower thoracic region    Order Specific Question:   Is the patient pregnant?    Answer:   No    Order Specific Question:   Preferred imaging location?    Answer:   External  . Ambulatory referral to Physical Therapy    Referral Priority:   Routine    Referral Type:   Physical Medicine    Referral Reason:   Specialty Services Required    Requested Specialty:   Physical Therapy     Number of Visits Requested:   1  . POCT urinalysis dipstick  . POCT Microscopic Urinalysis ([facility])    Meds ordered this encounter  Medications  .  diclofenac (FLECTOR) 1.3 % PTCH    Sig: Place 1 patch onto the skin 2 (two) times daily.    Dispense:  60 patch    Refill:  1  . cyclobenzaprine (FLEXERIL) 5 MG tablet    Sig: Take 1 tablet (5 mg total) by mouth 3 (three) times daily as needed for muscle spasms. Take 2 tabs before bed at night    Dispense:  30 tablet    Refill:  1    I personally performed the services described in this documentation, which was scribed in my presence. The recorded information has been reviewed and considered, and addended by me as needed.   Delman Cheadle, M.D.  Primary Care at [facility] [address] [place], [place] [address] [phone] phone [phone] fax  02/21/17 11:18 PM

## 2017-02-21 ENCOUNTER — Other Ambulatory Visit: Payer: Self-pay | Admitting: Gastroenterology

## 2017-02-22 ENCOUNTER — Telehealth: Payer: Self-pay | Admitting: Family Medicine

## 2017-02-22 MED ORDER — DICLOFENAC SODIUM 3 % TD GEL: 2.0000 g | Freq: Four times a day (QID) | TRANSDERMAL | 3 refills | Status: DC

## 2017-02-22 NOTE — Telephone Encounter (Signed)
Bummer. I'm sorry.  I sent in a rx for the same medicine in a topical gel to rub on rather than the patch but I have no idea what is on her insurance formulary. Sometimes when we rx a med it will give [place] a clue if it is covered and on what tier but currently the computer is giving me nothing so if that is not covered either perhaps she could see if the pharmacist has a suggestion as the topical diclofenac also comes in a cream or in a 1 or 2% strength that may or may not be on her formulary.  Of course, the most direct way for her to find out what is most cost effective would be for her to call her insurance and ask which topical anti-inflammatory is on their formulary. If there are none, I would recommend she invest in OTC Aspercreme and OTC Solonpas patches.

## 2017-02-22 NOTE — Telephone Encounter (Signed)
Pt states that the pharmacy adv. her that she is not elig. for the coupon for $20.00 co-pay on her diclofenac (FLECTOR) 1.3 % PTCH. She wanted to know is there anything else you can call in for her.   Pt contact number [phone].

## 2017-02-24 ENCOUNTER — Telehealth: Payer: Self-pay | Admitting: *Deleted

## 2017-02-24 NOTE — Telephone Encounter (Signed)
Spoke to patient about diclofenac gel, she said the gel was expensive but she bought it anyway. I advised her based on the message Dr Brigitte Pulse stated in her note, to call the insurance and talk with them. They will be able to suggest a cheaper gel or cream under her plan. Also, she can try the Aspercreme and Solonpas patches OTC, I suggested trying OTC patches to see if they work and save the expensive patches to alternate. The patient said on a visit before she was given lidocaine patches that helped.

## 2017-03-02 ENCOUNTER — Encounter: Payer: Self-pay | Admitting: Gynecology

## 2017-04-25 ENCOUNTER — Other Ambulatory Visit: Payer: Self-pay | Admitting: Physician Assistant

## 2017-05-31 ENCOUNTER — Ambulatory Visit (INDEPENDENT_AMBULATORY_CARE_PROVIDER_SITE_OTHER): Payer: BC Managed Care – PPO | Admitting: Family Medicine

## 2017-05-31 ENCOUNTER — Encounter: Payer: Self-pay | Admitting: Family Medicine

## 2017-05-31 VITALS — BP 153/96 | HR 102 | Temp 97.8°F | Resp 18 | Ht 63.0 in | Wt 184.6 lb

## 2017-05-31 DIAGNOSIS — Z5181 Encounter for therapeutic drug level monitoring: Secondary | ICD-10-CM | POA: Diagnosis not present

## 2017-05-31 DIAGNOSIS — K219 Gastro-esophageal reflux disease without esophagitis: Secondary | ICD-10-CM

## 2017-05-31 DIAGNOSIS — E6609 Other obesity due to excess calories: Secondary | ICD-10-CM | POA: Diagnosis not present

## 2017-05-31 DIAGNOSIS — I10 Essential (primary) hypertension: Secondary | ICD-10-CM

## 2017-05-31 DIAGNOSIS — Z6831 Body mass index (BMI) 31.0-31.9, adult: Secondary | ICD-10-CM | POA: Diagnosis not present

## 2017-05-31 DIAGNOSIS — Z8679 Personal history of other diseases of the circulatory system: Secondary | ICD-10-CM

## 2017-05-31 MED ORDER — BISOPROLOL-HYDROCHLOROTHIAZIDE 5-6.25 MG PO TABS: 1.0000 | ORAL_TABLET | Freq: Every day | ORAL | 0 refills | Status: DC

## 2017-05-31 MED ORDER — AMLODIPINE BESYLATE 5 MG PO TABS: 5.0000 mg | ORAL_TABLET | Freq: Every day | ORAL | 0 refills | Status: DC

## 2017-05-31 MED ORDER — RANITIDINE HCL 150 MG PO CAPS: 150.0000 mg | ORAL_CAPSULE | Freq: Two times a day (BID) | ORAL | 3 refills | Status: DC

## 2017-05-31 NOTE — Progress Notes (Signed)
Subjective:    Patient ID: Megan Lang, female    DOB: [birth year], 46 y.o.   MRN: [id number] Chief Complaint  Patient presents with  . Medication Refill    HCTZ    HPI  Megan Lang is a 46 yo woman who is here to follow-up on her HTN.  HTN: Monitors BP outside office daily and sometimes might spike up to 150/96 -> 143/88 -> 128/82.  On amlodipine 5, hctz 6.25 qd - she took her last pills yesterday.  She does not know why she is on such a low dose of hctz. Did have mild hypokalemia 3.4 on last bmp 9 mos prior. Occ HAs/lightheadedness. Occ rapid heartbeat, occ CP - about every 2-3 wks - a little ache in her left chest. Sometimes she can feel the fluttering with [facility] that is instantaneous and self-resolving sometimes accompanied by CP. Did see cardiology Dr. Oval Linsey in early 2017 - 1 1/2  Yrs ago - had 10/2015 exercise Cardiolite that was negative for ischemia and an echo with normal systolic fxn and no diastolic dysfxn. She feels her current sxs are actually better than they were back then. She was started on a ppi bid and sxs resolved.  GERD: Severe w/ HH. On omeprazole 40mg  qd seen by Dr. Havery Moros. Neg H. Pylor 07/2015. She has tried to come off of it for the past 2 wks.   Chronic left flank pain: with swelling started after heavy lifting 1 yr prior 06/2016 and failed oral nsaid and meloxicam prev due to gastritis. Seen 3 mos prior for an exacerbation and put on topical diclofenac (very expensive so rec otc salon pas lidocaine patches or aspercreme instead.)  Seen by massage therapist sev times prior but worsens with palpation so was referred to PT after t- and l-spine xrays shows some mild wear-and tear changes throughout.  She did complete PT and got some improvement but not complete resolution - was at the [facility].  Scoliosis was a new diagnosis and she does trip over her left foot prior.   Asthma: intermittent, has alb at home - has been told she doesn't actually have  asthma by Dr. Melvyn Novas - last appt 10/2015 but then doesn't know what is wrong with her breathing. She does have a sinus blockage that caused a cough from last May to Sept last year so she had sinus surgery 09/2015 which didn't go well in recovery.   Thoracic aortic aneurysm: 4.1 cm fusiform ascending aneurysm diagnosed 2017, unchanged 11/2016 - follow with vascular annually with CTA. Lipid panel 1 yr prior fantastic with LDL 68 and non-HDL 83  Obesity BMI 30-32: no prior a1c. SHe joined a gym last mo and have been trying to decrease in fried foods.  Past Medical History:  Diagnosis Date  . Acid reflux   . Allergy   . Aortic aneurysm ([place])   . Asthma   . Cardiomegaly   . Carpal tunnel syndrome   . Gastric polyp   . GERD (gastroesophageal reflux disease) 01/23/2016  . Hiatal hernia   . Hypertension   . Normal spontaneous vaginal delivery    3   Past Surgical History:  Procedure Laterality Date  . CARPAL TUNNEL RELEASE  2009  . CARPAL TUNNEL RELEASE Left   . COLONOSCOPY    . NASAL SINUS SURGERY     DEC 15,2016   Current Outpatient Prescriptions on File Prior to Visit  Medication Sig Dispense Refill  . acetaminophen (TYLENOL) 500  MG tablet Take 500 mg by mouth.    Marland Kitchen albuterol (PROVENTIL HFA;VENTOLIN HFA) 108 (90 Base) MCG/ACT inhaler Inhale 1-2 puffs into the lungs every 6 (six) hours as needed for wheezing or shortness of breath. 1 Inhaler 0  . carboxymethylcellulose (REFRESH PLUS) 0.5 % SOLN Place 1 drop into both eyes 3 (three) times daily as needed (for eye irritation).    . cyclobenzaprine (FLEXERIL) 5 MG tablet Take 1 tablet (5 mg total) by mouth 3 (three) times daily as needed for muscle spasms. Take 2 tabs before bed at night 30 tablet 1  . etonogestrel (NEXPLANON) 68 MG IMPL implant 1 each by Subdermal route once.    . fluticasone (FLONASE) 50 MCG/ACT nasal spray Place into the nose.    . Multiple Vitamin (THERA) TABS Take by mouth.    . Olopatadine HCl (PAZEO) 0.7 % SOLN Apply  to eye.     No current facility-administered medications on file prior to visit.    Allergies  Allergen Reactions  . Clindamycin Diarrhea and Other (See Comments)    GI upset  . Codeine Other (See Comments)    TACHYCARDIA   . Levofloxacin Other (See Comments)    other  . Metronidazole Other (See Comments)    Other  . Minocin [Minocycline Hcl] Other (See Comments)    Stomach Pains  . Moxifloxacin Swelling    other  . Penicillins Rash    Has patient had a PCN reaction causing immediate rash, facial/tongue/throat swelling, SOB or lightheadedness with hypotension:yes Has patient had a PCN reaction causing severe rash involving mucus membranes or skin necrosis:no Has patient had a PCN reaction that required hospitalization:no Has patient had a PCN reaction occurring within the last 10 years:No If all of the above answers are "NO", then may proceed with Cephalosporin   Family History  Problem Relation Age of Onset  . Hypertension Mother   . Kidney disease Father   . Liver disease Father        Unknown diagnosis  . Breast cancer Maternal Grandmother   . Hypertension Sister   . Hypertension Brother    Social History   Social History  . Marital status: Married    Spouse name: N/A  . Number of children: 3  . 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 Smoking status: Never Smoker  . Smokeless tobacco: Never Used  . Alcohol use No  . Drug use: No  . 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 None   Social History Narrative   Epworth Sleepiness Scale = 8 (as of 10/28/2015)   Depression screen [facility] 2/9 05/31/2017 02/19/2017 12/16/2016 11/06/2016 09/06/2016  Decreased Interest 0 0 0 0 0  Down, Depressed, Hopeless 0 0 0 0 0  PHQ - 2 Score 0 0 0 0 0    Review of Systems See hpi    Objective:   Physical Exam  Constitutional: She is oriented to person, place, and  time. She appears well-developed and well-nourished. No distress.  HENT:  Head: Normocephalic and atraumatic.  Right Ear: External ear normal.  Left Ear: External ear normal.  Eyes: Conjunctivae are normal. No scleral icterus.  Neck: Normal range of motion. Neck supple. No thyromegaly present.  Cardiovascular: Normal rate, regular rhythm, normal heart sounds and intact distal pulses.   Pulmonary/Chest: Effort normal and  breath sounds normal. No respiratory distress.  Musculoskeletal: She exhibits no edema.  Lymphadenopathy:    She has no cervical adenopathy.  Neurological: She is alert and oriented to person, place, and time.  Skin: Skin is warm and dry. She is not diaphoretic. No erythema.  Psychiatric: She has a normal mood and affect. Her behavior is normal.      BP (!) 153/96   Pulse (!) 102   Temp 97.8 F (36.6 C) (Oral)   Resp 18   Ht 5\' 3"  (1.6 m)   Wt 184 lb 9.6 oz (83.7 kg)   SpO2 99%   BMI 32.70 kg/m     [facility] reading (PRIMARY) by  Dr. Brigitte Pulse. EKG: NSR, no ischemic change. Decreased R wave amplitude in lateral chest leads compared to last prior EKG 10/03/15. Assessment & Plan:  Vascular notes that pt should be started on metoprolol 25mg  bid if systolic BP >505 or add losartan if diastolic BP >69 Bmp Rec referral to ortho spine (Spine and Scoliosis) - call when needed  1. Essential hypertension - uncontrolled. Restart bisoprolol to help with palpitaitons - not sure why it was stopped prior.  2. Gastroesophageal reflux disease, esophagitis presence not specified   3. Class 1 obesity due to excess calories with serious comorbidity and body mass index (BMI) of 31.0 to 31.9 in adult   4. History of aortic aneurysm   5. Medication monitoring encounter     Orders Placed This Encounter  Procedures  . EKG 12-Lead    Meds ordered this encounter  Medications  . bisoprolol-hydrochlorothiazide (ZIAC) 5-6.25 MG tablet    Sig: Take 1 tablet by mouth daily.    Dispense:   90 tablet    Refill:  0  . amLODipine (NORVASC) 5 MG tablet    Sig: Take 1 tablet (5 mg total) by mouth daily.    Dispense:  90 tablet    Refill:  0  . ranitidine (ZANTAC) 150 MG capsule    Sig: Take 1 capsule (150 mg total) by mouth 2 (two) times daily.    Dispense:  180 capsule    Refill:  3    Delman Cheadle, M.D.  Primary Care at [facility] [address] [place], [place] [address] [phone] phone [phone] fax  06/02/17 11:43 PM

## 2017-05-31 NOTE — Patient Instructions (Addendum)
Do not use with any otc pain medication other than tylenol/acetaminophen - so no aleve, ibuprofen, motrin, advil, etc.  Continue on your amlodipine 5. Instead of the prior 1/2 tab of hctz, we are going to restart you on the prior bisoprolol-hctz. Recheck with me in 4-6 weeks and we will recheck your kidneys function, salts in the blood at that time.    IF you received an x-ray today, you will receive an invoice from [facility]. Please contact [facility] at [phone] with questions or concerns regarding your invoice.   IF you received labwork today, you will receive an invoice from [facility]. Please contact [facility] at [phone] with questions or concerns regarding your invoice.   Our billing staff will not be able to assist you with questions regarding bills from these companies.  You will be contacted with the lab results as soon as they are available. The fastest way to get your results is to activate your My Chart account. Instructions are located on the last page of this paperwork. If you have not heard from [place] regarding the results in 2 weeks, please contact this office.      Managing Your Hypertension Hypertension is commonly called high blood pressure. This is when the force of your blood pressing against the walls of your arteries is too strong. Arteries are blood vessels that carry blood from your heart throughout your body. Hypertension forces the heart to work harder to pump blood, and may cause the arteries to become narrow or stiff. Having untreated or uncontrolled hypertension can cause heart attack, stroke, kidney disease, and other problems. What are blood pressure readings? A blood pressure reading consists of a higher number over a lower number. Ideally, your blood pressure should be below 120/80. The first ("top") number is called the systolic pressure. It is a measure of the pressure in your arteries as your heart beats. The second ("bottom") number  is called the diastolic pressure. It is a measure of the pressure in your arteries as the heart relaxes. What does my blood pressure reading mean? Blood pressure is classified into four stages. Based on your blood pressure reading, your health care provider may use the following stages to determine what type of treatment you need, if any. Systolic pressure and diastolic pressure are measured in a unit called mm Hg. Normal  Systolic pressure: below 408.  Diastolic pressure: below 80. Elevated  Systolic pressure: 144-818.  Diastolic pressure: below 80. Hypertension stage 1  Systolic pressure: 563-149.  Diastolic pressure: 70-26. Hypertension stage 2  Systolic pressure: 378 or above.  Diastolic pressure: 90 or above. What health risks are associated with hypertension? Managing your hypertension is an important responsibility. Uncontrolled hypertension can lead to:  A heart attack.  A stroke.  A weakened blood vessel (aneurysm).  Heart failure.  Kidney damage.  Eye damage.  Metabolic syndrome.  Memory and concentration problems.  What changes can I make to manage my hypertension? Hypertension can be managed by making lifestyle changes and possibly by taking medicines. Your health care provider will help you make a plan to bring your blood pressure within a normal range. Eating and drinking  Eat a diet that is high in fiber and potassium, and low in salt (sodium), added sugar, and fat. An example eating plan is called the DASH (Dietary Approaches to Stop Hypertension) diet. To eat this way: ? Eat plenty of fresh fruits and vegetables. Try to fill half of your plate at each meal with fruits and  vegetables. ? Eat whole grains, such as whole wheat pasta, brown rice, or whole grain bread. Fill about one quarter of your plate with whole grains. ? Eat low-fat diary products. ? Avoid fatty cuts of meat, processed or cured meats, and poultry with skin. Fill about one quarter of  your plate with lean proteins such as fish, chicken without skin, beans, eggs, and tofu. ? Avoid premade and processed foods. These tend to be higher in sodium, added sugar, and fat.  Reduce your daily sodium intake. Most people with hypertension should eat less than 1,500 mg of sodium a day.  Limit alcohol intake to no more than 1 drink a day for nonpregnant women and 2 drinks a day for men. One drink equals 12 oz of beer, 5 oz of wine, or 1 oz of hard liquor. Lifestyle  Work with your health care provider to maintain a healthy body weight, or to lose weight. Ask what an ideal weight is for you.  Get at least 30 minutes of exercise that causes your heart to beat faster (aerobic exercise) most days of the week. Activities may include walking, swimming, or biking.  Include exercise to strengthen your muscles (resistance exercise), such as weight lifting, as part of your weekly exercise routine. Try to do these types of exercises for 30 minutes at least 3 days a week.  Do not use any products that contain nicotine or tobacco, such as cigarettes and e-cigarettes. If you need help quitting, ask your health care provider.  Control any long-term (chronic) conditions you have, such as high cholesterol or diabetes. Monitoring  Monitor your blood pressure at home as told by your health care provider. Your personal target blood pressure may vary depending on your medical conditions, your age, and other factors.  Have your blood pressure checked regularly, as often as told by your health care provider. Working with your health care provider  Review all the medicines you take with your health care provider because there may be side effects or interactions.  Talk with your health care provider about your diet, exercise habits, and other lifestyle factors that may be contributing to hypertension.  Visit your health care provider regularly. Your health care provider can help you create and adjust your  plan for managing hypertension. Will I need medicine to control my blood pressure? Your health care provider may prescribe medicine if lifestyle changes are not enough to get your blood pressure under control, and if:  Your systolic blood pressure is 130 or higher.  Your diastolic blood pressure is 80 or higher.  Take medicines only as told by your health care provider. Follow the directions carefully. Blood pressure medicines must be taken as prescribed. The medicine does not work as well when you skip doses. Skipping doses also puts you at risk for problems. Contact a health care provider if:  You think you are having a reaction to medicines you have taken.  You have repeated (recurrent) headaches.  You feel dizzy.  You have swelling in your ankles.  You have trouble with your vision. Get help right away if:  You develop a severe headache or confusion.  You have unusual weakness or numbness, or you feel faint.  You have severe pain in your chest or abdomen.  You vomit repeatedly.  You have trouble breathing. Summary  Hypertension is when the force of blood pumping through your arteries is too strong. If this condition is not controlled, it may put you at risk for serious  complications.  Your personal target blood pressure may vary depending on your medical conditions, your age, and other factors. For most people, a normal blood pressure is less than 120/80.  Hypertension is managed by lifestyle changes, medicines, or both. Lifestyle changes include weight loss, eating a healthy, low-sodium diet, exercising more, and limiting alcohol. This information is not intended to replace advice given to you by your health care provider. Make sure you discuss any questions you have with your health care provider. Document Released: 06/28/2012 Document Revised: 09/01/2016 Document Reviewed: 09/01/2016 Elsevier Interactive Patient Education  [employer].

## 2017-06-17 ENCOUNTER — Ambulatory Visit (INDEPENDENT_AMBULATORY_CARE_PROVIDER_SITE_OTHER): Payer: BC Managed Care – PPO | Admitting: Family Medicine

## 2017-06-17 ENCOUNTER — Encounter: Payer: Self-pay | Admitting: Family Medicine

## 2017-06-17 VITALS — BP 137/87 | HR 78 | Temp 99.0°F | Resp 18 | Ht 63.0 in | Wt 183.6 lb

## 2017-06-17 DIAGNOSIS — R1032 Left lower quadrant pain: Secondary | ICD-10-CM | POA: Diagnosis not present

## 2017-06-17 LAB — POCT CBC
Granulocyte percent: 62.2 %G (ref 37–80)
HCT, POC: 39.7 % (ref 37.7–47.9)
Hemoglobin: 13.3 g/dL (ref 12.2–16.2)
Lymph, poc: 2.1 (ref 0.6–3.4)
MCH, POC: 29.8 pg (ref 27–31.2)
MCHC: 33.6 g/dL (ref 31.8–35.4)
MCV: 88.9 fL (ref 80–97)
MID (cbc): 0.2 (ref 0–0.9)
MPV: 7.6 fL (ref 0–99.8)
POC Granulocyte: 3.9 (ref 2–6.9)
POC LYMPH PERCENT: 34 %L (ref 10–50)
POC MID %: 3.8 %M (ref 0–12)
Platelet Count, POC: 258 10*3/uL (ref 142–424)
RBC: 4.46 M/uL (ref 4.04–5.48)
RDW, POC: 12.7 %
WBC: 6.3 10*3/uL (ref 4.6–10.2)

## 2017-06-17 LAB — POCT URINALYSIS DIP (MANUAL ENTRY)
Bilirubin, UA: NEGATIVE
Blood, UA: NEGATIVE
Glucose, UA: NEGATIVE mg/dL
Ketones, POC UA: NEGATIVE mg/dL
Leukocytes, UA: NEGATIVE
Nitrite, UA: NEGATIVE
Protein Ur, POC: NEGATIVE mg/dL
Spec Grav, UA: 1.025 (ref 1.010–1.025)
Urobilinogen, UA: 1 E.U./dL
pH, UA: 6 (ref 5.0–8.0)

## 2017-06-17 NOTE — Patient Instructions (Signed)
     IF you received an x-ray today, you will receive an invoice from [facility]. Please contact [facility] at [phone] with questions or concerns regarding your invoice.   IF you received labwork today, you will receive an invoice from [facility]. Please contact [facility] at [phone] with questions or concerns regarding your invoice.   Our billing staff will not be able to assist you with questions regarding bills from these companies.  You will be contacted with the lab results as soon as they are available. The fastest way to get your results is to activate your My Chart account. Instructions are located on the last page of this paperwork. If you have not heard from us regarding the results in 2 weeks, please contact this office.     

## 2017-06-17 NOTE — Progress Notes (Signed)
8/31/20186:19 PM  Megan Lang [birth year], 46 y.o. female [id number]  Chief Complaint  Patient presents with  . Abdominal Pain    lower left side started yesterday pt states its a dull pain   . Medication Refill    albuterol     HPI:   Patient is a 46 y.o. female who presents today for 2 days of dull intermittent LLQ abd pain, started yesterday after work, some chills, no frank fever. Worse with twisting, nothing makes it better. Denies any nausea, vomiting, diarrhea, constipation, blood in stool. Denies any dysuria, hematuria. Has some frequency but feels that is related to her bp meds. Has a remote h/o kidney stones. Has + diverticula on colonoscopy. Denies any recent trauma or injuries. Denies any h/o ovarian cysts. Has nexplanon in place.  Depression screen [facility] 2/9 06/17/2017 05/31/2017 02/19/2017  Decreased Interest 0 0 0  Down, Depressed, Hopeless 0 0 0  PHQ - 2 Score 0 0 0    Allergies  Allergen Reactions  . Clindamycin Diarrhea and Other (See Comments)    GI upset  . Codeine Other (See Comments)    TACHYCARDIA   . Levofloxacin Other (See Comments)    other  . Metronidazole Other (See Comments)    Other  . Minocin [Minocycline Hcl] Other (See Comments)    Stomach Pains  . Moxifloxacin Swelling    other  . Penicillins Rash    Has patient had a PCN reaction causing immediate rash, facial/tongue/throat swelling, SOB or lightheadedness with hypotension:yes Has patient had a PCN reaction causing severe rash involving mucus membranes or skin necrosis:no Has patient had a PCN reaction that required hospitalization:no Has patient had a PCN reaction occurring within the last 10 years:No If all of the above answers are "NO", then may proceed with Cephalosporin    Current Outpatient Prescriptions on File Prior to Visit  Medication Sig Dispense Refill  . acetaminophen (TYLENOL) 500 MG tablet Take 500 mg by mouth.    Marland Kitchen albuterol (PROVENTIL HFA;VENTOLIN HFA) 108 (90 Base)  MCG/ACT inhaler Inhale 1-2 puffs into the lungs every 6 (six) hours as needed for wheezing or shortness of breath. 1 Inhaler 0  . amLODipine (NORVASC) 5 MG tablet Take 1 tablet (5 mg total) by mouth daily. 90 tablet 0  . bisoprolol-hydrochlorothiazide (ZIAC) 5-6.25 MG tablet Take 1 tablet by mouth daily. 90 tablet 0  . carboxymethylcellulose (REFRESH PLUS) 0.5 % SOLN Place 1 drop into both eyes 3 (three) times daily as needed (for eye irritation).    . cyclobenzaprine (FLEXERIL) 5 MG tablet Take 1 tablet (5 mg total) by mouth 3 (three) times daily as needed for muscle spasms. Take 2 tabs before bed at night 30 tablet 1  . etonogestrel (NEXPLANON) 68 MG IMPL implant 1 each by Subdermal route once.    . fluticasone (FLONASE) 50 MCG/ACT nasal spray Place into the nose.    . Multiple Vitamin (THERA) TABS Take by mouth.    . Olopatadine HCl (PAZEO) 0.7 % SOLN Apply to eye.    . ranitidine (ZANTAC) 150 MG capsule Take 1 capsule (150 mg total) by mouth 2 (two) times daily. 180 capsule 3   No current facility-administered medications on file prior to visit.     Past Medical History:  Diagnosis Date  . Acid reflux   . Allergy   . Aortic aneurysm ([place])   . Asthma   . Cardiomegaly   . Carpal tunnel syndrome   . Gastric polyp   .  GERD (gastroesophageal reflux disease) 01/23/2016  . Hiatal hernia   . Hypertension   . Normal spontaneous vaginal delivery    3    Past Surgical History:  Procedure Laterality Date  . CARPAL TUNNEL RELEASE  2009  . CARPAL TUNNEL RELEASE Left   . COLONOSCOPY    . NASAL SINUS SURGERY     DEC 15,2016    Social History  Substance Use Topics  . Smoking status: Never Smoker  . Smokeless tobacco: Never Used  . Alcohol use No    Family History  Problem Relation Age of Onset  . Hypertension Mother   . Kidney disease Father   . Liver disease Father        Unknown diagnosis  . Breast cancer Maternal Grandmother   . Hypertension Sister   . Hypertension Brother       Review of Systems  Constitutional: Positive for chills. Negative for fever and malaise/fatigue.  HENT: Negative for congestion, ear pain and sore throat.   Respiratory: Negative for cough and shortness of breath.   Gastrointestinal: Positive for abdominal pain. Negative for blood in stool, constipation, diarrhea, nausea and vomiting.  Genitourinary: Positive for frequency. Negative for dysuria, flank pain and hematuria.     OBJECTIVE:  Blood pressure 137/87, pulse 78, temperature 99 F (37.2 C), temperature source Oral, resp. rate 18, height 5\' 3"  (1.6 m), weight 183 lb 9.6 oz (83.3 kg), last menstrual period 06/01/2017, SpO2 99 %.  Physical Exam  Constitutional: She is oriented to person, place, and time and well-developed, well-nourished, and in no distress.  HENT:  Head: Normocephalic and atraumatic.  Mouth/Throat: Oropharynx is clear and moist. No oropharyngeal exudate.  Eyes: Pupils are equal, round, and reactive to light. EOM are normal. No scleral icterus.  Neck: Neck supple.  Cardiovascular: Normal rate, regular rhythm and normal heart sounds.  Exam reveals no gallop and no friction rub.   No murmur heard. Pulmonary/Chest: Effort normal and breath sounds normal. She has no wheezes. She has no rales.  Abdominal: Soft. Bowel sounds are normal. She exhibits no distension. There is tenderness (LLQ > LUQ). There is no rebound, no guarding and no CVA tenderness.  Musculoskeletal: She exhibits no edema.  Neurological: She is alert and oriented to person, place, and time. Gait normal.  Skin: Skin is warm and dry.    Results for orders placed or performed in visit on 06/17/17 (from the past 24 hour(s))  POCT urinalysis dipstick     Status: None   Collection Time: 06/17/17  6:04 PM  Result Value Ref Range   Color, UA yellow yellow   Clarity, UA clear clear   Glucose, UA negative negative mg/dL   Bilirubin, UA negative negative   Ketones, POC UA negative negative mg/dL    Spec Grav, UA 1.025 1.010 - 1.025   Blood, UA negative negative   pH, UA 6.0 5.0 - 8.0   Protein Ur, POC negative negative mg/dL   Urobilinogen, UA 1.0 0.2 or 1.0 E.U./dL   Nitrite, UA Negative Negative   Leukocytes, UA Negative Negative  POCT CBC     Status: None   Collection Time: 06/17/17  6:12 PM  Result Value Ref Range   WBC 6.3 4.6 - 10.2 K/uL   Lymph, poc 2.1 0.6 - 3.4   POC LYMPH PERCENT 34.0 10 - 50 %L   MID (cbc) 0.2 0 - 0.9   POC MID % 3.8 0 - 12 %M   POC Granulocyte 3.9 2 -  6.9   Granulocyte percent 62.2 37 - 80 %G   RBC 4.46 4.04 - 5.48 M/uL   Hemoglobin 13.3 12.2 - 16.2 g/dL   HCT, POC 39.7 37.7 - 47.9 %   MCV 88.9 80 - 97 fL   MCH, POC 29.8 27 - 31.2 pg   MCHC 33.6 31.8 - 35.4 g/dL   RDW, POC 12.7 %   Platelet Count, POC 258 142 - 424 K/uL   MPV 7.6 0 - 99.8 fL    ASSESSMENT and PLAN:  1. Abdominal pain, left lower quadrant Patient afebrile with normal UA and CBC. Etiology unclear at this time. Discussed APAP prn pain, given sign GERD. Ordering pelvic US to eval ovaries. ER precautions given. If this were a developing diverticulitis, treatment would be challenging given her allergy profile. FU 1 week.       Rutherford Guys, MD [facility] [place], [place] [address] Ph.  [phone] Fax [phone]

## 2017-06-20 ENCOUNTER — Encounter (HOSPITAL_COMMUNITY): Payer: Self-pay | Admitting: Emergency Medicine

## 2017-06-20 ENCOUNTER — Emergency Department (HOSPITAL_COMMUNITY)
Admission: EM | Admit: 2017-06-20 | Discharge: 2017-06-20 | Disposition: A | Discharge status: Home / Self Care | Payer: BC Managed Care – PPO | Attending: Emergency Medicine | Admitting: Emergency Medicine

## 2017-06-20 ENCOUNTER — Emergency Department (HOSPITAL_COMMUNITY): Payer: BC Managed Care – PPO

## 2017-06-20 DIAGNOSIS — K5792 Diverticulitis of intestine, part unspecified, without perforation or abscess without bleeding: Secondary | ICD-10-CM | POA: Insufficient documentation

## 2017-06-20 DIAGNOSIS — J45909 Unspecified asthma, uncomplicated: Secondary | ICD-10-CM | POA: Diagnosis not present

## 2017-06-20 DIAGNOSIS — I10 Essential (primary) hypertension: Secondary | ICD-10-CM | POA: Insufficient documentation

## 2017-06-20 DIAGNOSIS — Z79899 Other long term (current) drug therapy: Secondary | ICD-10-CM | POA: Diagnosis not present

## 2017-06-20 DIAGNOSIS — R1032 Left lower quadrant pain: Secondary | ICD-10-CM | POA: Diagnosis present

## 2017-06-20 LAB — COMPREHENSIVE METABOLIC PANEL
ALT: 18 U/L (ref 14–54)
ANION GAP: 8 (ref 5–15)
AST: 19 U/L (ref 15–41)
Albumin: 3.9 g/dL (ref 3.5–5.0)
Alkaline Phosphatase: 67 U/L (ref 38–126)
BILIRUBIN TOTAL: 0.7 mg/dL (ref 0.3–1.2)
BUN: 11 mg/dL (ref 6–20)
CHLORIDE: 104 mmol/L (ref 101–111)
CO2: 24 mmol/L (ref 22–32)
Calcium: 9.1 mg/dL (ref 8.9–10.3)
Creatinine, Ser: 0.77 mg/dL (ref 0.44–1.00)
Glucose, Bld: 100 mg/dL — ABNORMAL HIGH (ref 65–99)
POTASSIUM: 3.4 mmol/L — AB (ref 3.5–5.1)
Sodium: 136 mmol/L (ref 135–145)
TOTAL PROTEIN: 7.1 g/dL (ref 6.5–8.1)

## 2017-06-20 LAB — URINALYSIS, ROUTINE W REFLEX MICROSCOPIC
Bilirubin Urine: NEGATIVE
Glucose, UA: NEGATIVE mg/dL
HGB URINE DIPSTICK: NEGATIVE
Ketones, ur: NEGATIVE mg/dL
LEUKOCYTES UA: NEGATIVE
Nitrite: NEGATIVE
Protein, ur: NEGATIVE mg/dL
SPECIFIC GRAVITY, URINE: 1.024 (ref 1.005–1.030)
pH: 5 (ref 5.0–8.0)

## 2017-06-20 LAB — CBC
HCT: 38.5 % (ref 36.0–46.0)
HEMOGLOBIN: 13.3 g/dL (ref 12.0–15.0)
MCH: 29.7 pg (ref 26.0–34.0)
MCHC: 34.5 g/dL (ref 30.0–36.0)
MCV: 85.9 fL (ref 78.0–100.0)
PLATELETS: 263 10*3/uL (ref 150–400)
RBC: 4.48 MIL/uL (ref 3.87–5.11)
RDW: 12.6 % (ref 11.5–15.5)
WBC: 5.8 10*3/uL (ref 4.0–10.5)

## 2017-06-20 LAB — I-STAT BETA HCG BLOOD, ED (MC, WL, AP ONLY)

## 2017-06-20 LAB — LIPASE, BLOOD: LIPASE: 22 U/L (ref 11–51)

## 2017-06-20 MED ORDER — IOPAMIDOL (ISOVUE-300) INJECTION 61%
INTRAVENOUS | Status: AC
  Administered 2017-06-20: 100 mL via INTRAVENOUS
  Filled 2017-06-20: qty 100

## 2017-06-20 MED ORDER — IOPAMIDOL (ISOVUE-300) INJECTION 61%
100.0000 mL | Freq: Once | INTRAVENOUS | Status: AC | PRN
  Administered 2017-06-20: 100 mL via INTRAVENOUS

## 2017-06-20 MED ORDER — HYDROCODONE-ACETAMINOPHEN 5-325 MG PO TABS: 1.0000 | ORAL_TABLET | ORAL | 0 refills | Status: DC | PRN

## 2017-06-20 MED ORDER — SODIUM CHLORIDE 0.9 % IV BOLUS (SEPSIS)
1000.0000 mL | Freq: Once | INTRAVENOUS | Status: AC
  Administered 2017-06-20: 1000 mL via INTRAVENOUS

## 2017-06-20 MED ORDER — MORPHINE SULFATE (PF) 4 MG/ML IV SOLN
6.0000 mg | Freq: Once | INTRAVENOUS | Status: AC
  Administered 2017-06-20: 6 mg via INTRAVENOUS
  Filled 2017-06-20: qty 2

## 2017-06-20 MED ORDER — CIPROFLOXACIN HCL 500 MG PO TABS: 500.0000 mg | ORAL_TABLET | Freq: Two times a day (BID) | ORAL | 0 refills | Status: DC

## 2017-06-20 MED ORDER — ONDANSETRON 8 MG PO TBDP: 8.0000 mg | ORAL_TABLET | Freq: Three times a day (TID) | ORAL | 0 refills | Status: DC | PRN

## 2017-06-20 MED ORDER — METRONIDAZOLE 500 MG PO TABS: 500.0000 mg | ORAL_TABLET | Freq: Two times a day (BID) | ORAL | 0 refills | Status: DC

## 2017-06-20 NOTE — ED Triage Notes (Signed)
Per pt, states LLQ pain since last Thursday-states she saw PCP on Friday and has [place] scheduled for next week-states pain was getting worse so she came to ED

## 2017-06-20 NOTE — ED Provider Notes (Signed)
[facility] DEPT Provider Note   CSN: [id number] Arrival date & time: 06/20/17  0957     History   Chief Complaint Chief Complaint  Patient presents with  . Abdominal Pain    HPI Megan Lang is a 46 y.o. female.  HPI Patient presents the emergency department with approximately 4 days of worsening left lower quadrant abdominal pain.  Her pain is been worsening and thus she was told to come the emergency department for evaluation.  She denies fevers and chills.  No prior history of diverticulitis.  No abnormal vaginal bleeding or vaginal discharge.  Denies flank pain.  No urinary complaints.  Pain is moderate in severity and worse with palpation of her left lower quadrant.   Past Medical History:  Diagnosis Date  . Acid reflux   . Allergy   . Aortic aneurysm ([place])   . Asthma   . Cardiomegaly   . Carpal tunnel syndrome   . Gastric polyp   . GERD (gastroesophageal reflux disease) 01/23/2016  . Hiatal hernia   . Hypertension   . Normal spontaneous vaginal delivery    3    Patient Active Problem List   Diagnosis Date Noted  . Cough 08/30/2016  . Nexplanon in place 07/29/2016  . Cardiomegaly 07/01/2016  . History of aortic aneurysm 07/01/2016  . GERD (gastroesophageal reflux disease) 01/23/2016  . Dyspnea 10/27/2015  . Costochondritis 08/26/2015  . Obesity 07/01/2015  . Upper airway cough syndrome 06/30/2015  . History of shingles 01/26/2015  . Family history of colon cancer 10/27/2011  . Essential hypertension 10/27/2011    Past Surgical History:  Procedure Laterality Date  . CARPAL TUNNEL RELEASE  2009  . CARPAL TUNNEL RELEASE Left   . COLONOSCOPY    . NASAL SINUS SURGERY     DEC 15,2016    OB History    Gravida Para Term Preterm AB Living   3 3 3     3    SAB TAB Ectopic Multiple Live Births           3       Home Medications    Prior to Admission medications   Medication Sig Start Date End Date Taking? Authorizing Provider  acetaminophen  (TYLENOL) 500 MG tablet Take 500 mg by mouth every 4 (four) hours as needed for moderate pain.    Yes [provider]  albuterol (PROVENTIL HFA;VENTOLIN HFA) 108 (90 Base) MCG/ACT inhaler Inhale 1-2 puffs into the lungs every 6 (six) hours as needed for wheezing or shortness of breath. 09/03/16  Yes Elnora Morrison, MD  amLODipine (NORVASC) 5 MG tablet Take 1 tablet (5 mg total) by mouth daily. 05/31/17  Yes Shawnee Knapp, MD  bisoprolol-hydrochlorothiazide [facility]) 5-6.25 MG tablet Take 1 tablet by mouth daily. 05/31/17  Yes Shawnee Knapp, MD  etonogestrel (NEXPLANON) 68 MG IMPL implant 1 each by Subdermal route once.   Yes [provider]  Multiple Vitamin (THERA) TABS Take 1 tablet by mouth daily.    Yes [provider]  sodium chloride (OCEAN) 0.65 % SOLN nasal spray Place 1 spray into both nostrils 2 (two) times daily.   Yes [provider]  ciprofloxacin (CIPRO) 500 MG tablet Take 1 tablet (500 mg total) by mouth 2 (two) times daily. 06/20/17   Jola Schmidt, MD  HYDROcodone-acetaminophen (NORCO/VICODIN) 5-325 MG tablet Take 1 tablet by mouth every 4 (four) hours as needed for moderate pain. 06/20/17   Jola Schmidt, MD  metroNIDAZOLE (FLAGYL) 500 MG  tablet Take 1 tablet (500 mg total) by mouth 2 (two) times daily. 06/20/17   Jola Schmidt, MD  ondansetron (ZOFRAN ODT) 8 MG disintegrating tablet Take 1 tablet (8 mg total) by mouth every 8 (eight) hours as needed for nausea or vomiting. 06/20/17   Jola Schmidt, MD    Family History Family History  Problem Relation Age of Onset  . Hypertension Mother   . Kidney disease Father   . Liver disease Father        Unknown diagnosis  . Breast cancer Maternal Grandmother   . Hypertension Sister   . Hypertension Brother     Social History Social History  Substance Use Topics  . Smoking status: Never Smoker  . Smokeless tobacco: Never Used  . Alcohol use No     Allergies   Clindamycin; Codeine; Levofloxacin;  Metronidazole; Minocin [minocycline hcl]; Moxifloxacin; and Penicillins   Review of Systems Review of Systems  All other systems reviewed and are negative.    Physical Exam Updated Vital Signs BP (!) 146/92   Pulse 80   Temp 98.7 F (37.1 C) (Oral)   Resp 15   Ht 5\' 3"  (1.6 m)   Wt 82.6 kg (182 lb)   LMP 06/01/2017 Comment: negative hcg blood test 06-20-2017  SpO2 100%   BMI 32.24 kg/m   Physical Exam  Constitutional: She is oriented to person, place, and time. She appears well-developed and well-nourished.  HENT:  Head: Normocephalic.  Eyes: EOM are normal.  Neck: Normal range of motion.  Cardiovascular: Normal rate.   Pulmonary/Chest: Effort normal.  Abdominal: She exhibits no distension.  Left lower quadrant tenderness  Musculoskeletal: Normal range of motion.  Neurological: She is alert and oriented to person, place, and time.  Psychiatric: She has a normal mood and affect.  Nursing note and vitals reviewed.    ED Treatments / Results  Labs (all labs ordered are listed, but only abnormal results are displayed) Labs Reviewed  COMPREHENSIVE METABOLIC PANEL - Abnormal; Notable for the following:       Result Value   Potassium 3.4 (*)    Glucose, Bld 100 (*)    All other components within normal limits  LIPASE, BLOOD  CBC  URINALYSIS, ROUTINE W REFLEX MICROSCOPIC  I-STAT BETA HCG BLOOD, ED ([facility], [facility], [facility] ONLY)    EKG  EKG Interpretation None       Radiology Ct Abdomen Pelvis W Contrast  Result Date: 06/20/2017 CLINICAL DATA:  Worsening left lower quadrant abdominal pain for several days. EXAM: CT ABDOMEN AND PELVIS WITH CONTRAST TECHNIQUE: Multidetector CT imaging of the abdomen and pelvis was performed using the standard protocol following bolus administration of intravenous contrast. CONTRAST:  100 cc Isovue-300 IV. COMPARISON:  07/24/2015 CT abdomen/pelvis. FINDINGS: Lower chest: Subsolid right middle lobe 5 mm pulmonary nodule (series 6/image 14), stable  since 07/24/2015. No acute abnormality at the lung bases. Hepatobiliary: Normal liver size. Two simple liver cysts, largest 1.7 cm in the right liver lobe. No additional liver lesions. Normal gallbladder with no radiopaque cholelithiasis. No biliary ductal dilatation. Pancreas: Normal, with no mass or duct dilation. Spleen: Normal size. No mass. Adrenals/Urinary Tract: Normal adrenals. Normal kidneys with no hydronephrosis and no renal mass. Normal bladder. Stomach/Bowel: Grossly normal stomach. Normal caliber small bowel with no small bowel wall thickening. Normal appendix. Moderate diverticulosis of the cecum and ascending colon. Mild diverticulosis of the descending and sigmoid colon. Minimal wall thickening with associated mild pericolonic fat stranding in the descending colon (  series 2/image 40), suggesting mild acute diverticulitis. No additional sites of large bowel wall thickening. Vascular/Lymphatic: Normal caliber abdominal aorta. Patent portal, splenic and renal veins. No pathologically enlarged lymph nodes in the abdomen or pelvis. Reproductive: Grossly normal anteverted uterus. Simple 1.8 cm left adnexal cyst, for which no follow-up is required ( This recommendation follows ACR consensus guidelines: White Paper of the ACR Incidental Findings Committee II on Adnexal Findings. J Am Coll Radiol [phone]). No right adnexal mass. Other: No pneumoperitoneum, ascites or focal fluid collection. Musculoskeletal: No aggressive appearing focal osseous lesions. Mild lumbar spondylosis. IMPRESSION: 1. CT findings are suggestive of mild acute diverticulitis in the descending colon. No free air. No abscess. 2. Moderate right colonic diverticulosis. 3. Subsolid 5 mm right middle lobe pulmonary nodule, for which 22 month stability has been demonstrated. No follow-up recommended. This recommendation follows the consensus statement: Guidelines for Management of Incidental Pulmonary Nodules Detected on CT Images:  From the Fleischner Society 2017; Radiology 2017; 284:228-243. Electronically Signed   By: Ilona Sorrel M.D.   On: 06/20/2017 13:53    Procedures Procedures (including critical care time)  Medications Ordered in ED Medications  morphine 4 MG/ML injection 6 mg (6 mg Intravenous Given 06/20/17 1223)  sodium chloride 0.9 % bolus 1,000 mL (1,000 mLs Intravenous New Bag/Given 06/20/17 1223)  iopamidol (ISOVUE-300) 61 % injection 100 mL (100 mLs Intravenous Contrast Given 06/20/17 1321)     Initial Impression / Assessment and Plan / ED Course  I have reviewed the triage vital signs and the nursing notes.  Pertinent labs & imaging results that were available during my care of the patient were reviewed by me and considered in my medical decision making (see chart for details).     Imaging consistent with acute diverticulitis without complicating factors.  Patient does have a significant allergy list however the majority of her allergies are not true allergies is much as they are intolerances.  Patient be discharged home with Cipro and Flagyl.  I think the patient will tolerate this fine.  I've told her to return to the ER if she is having issues.  She understands return to the ER for worsening symptoms as well  Final Clinical Impressions(s) / ED Diagnoses   Final diagnoses:  Diverticulitis    New Prescriptions New Prescriptions   CIPROFLOXACIN (CIPRO) 500 MG TABLET    Take 1 tablet (500 mg total) by mouth 2 (two) times daily.   HYDROCODONE-ACETAMINOPHEN (NORCO/VICODIN) 5-325 MG TABLET    Take 1 tablet by mouth every 4 (four) hours as needed for moderate pain.   METRONIDAZOLE (FLAGYL) 500 MG TABLET    Take 1 tablet (500 mg total) by mouth 2 (two) times daily.   ONDANSETRON (ZOFRAN ODT) 8 MG DISINTEGRATING TABLET    Take 1 tablet (8 mg total) by mouth every 8 (eight) hours as needed for nausea or vomiting.     Jola Schmidt, MD 06/20/17 [phone]

## 2017-06-22 ENCOUNTER — Telehealth: Payer: Self-pay | Admitting: Gastroenterology

## 2017-06-22 NOTE — Telephone Encounter (Signed)
Looking at her after visit paperwork, it said to follow up with her PCP. Do you want to have her make a follow up visit with APP?

## 2017-06-22 NOTE — Telephone Encounter (Signed)
If she would like to see [place] for follow up for this, that is fine, we can see her. As long as she is responding to antibiotics it is not urgent. She has had a colonoscopy within the past year showing diverticulosis, so I doubt any other pathology is going on. You can make her a routine visit with me to discuss it or with one of the APPs. Thanks

## 2017-06-23 NOTE — Telephone Encounter (Signed)
Pt scheduled to see Dr. Havery Moros 08/03/17@1 :45pm. Pt aware of appt.

## 2017-06-30 ENCOUNTER — Ambulatory Visit (INDEPENDENT_AMBULATORY_CARE_PROVIDER_SITE_OTHER): Payer: BC Managed Care – PPO | Admitting: Family Medicine

## 2017-06-30 ENCOUNTER — Encounter: Payer: Self-pay | Admitting: Family Medicine

## 2017-06-30 VITALS — BP 132/80 | HR 79 | Temp 99.2°F | Resp 16 | Ht 63.0 in | Wt 182.4 lb

## 2017-06-30 DIAGNOSIS — M4120 Other idiopathic scoliosis, site unspecified: Secondary | ICD-10-CM

## 2017-06-30 DIAGNOSIS — E876 Hypokalemia: Secondary | ICD-10-CM

## 2017-06-30 DIAGNOSIS — G8929 Other chronic pain: Secondary | ICD-10-CM

## 2017-06-30 DIAGNOSIS — M549 Dorsalgia, unspecified: Secondary | ICD-10-CM

## 2017-06-30 DIAGNOSIS — I10 Essential (primary) hypertension: Secondary | ICD-10-CM

## 2017-06-30 DIAGNOSIS — K5732 Diverticulitis of large intestine without perforation or abscess without bleeding: Secondary | ICD-10-CM

## 2017-06-30 MED ORDER — AMLODIPINE BESYLATE 5 MG PO TABS: 5.0000 mg | ORAL_TABLET | Freq: Every day | ORAL | 1 refills | Status: DC

## 2017-06-30 MED ORDER — BISOPROLOL-HYDROCHLOROTHIAZIDE 5-6.25 MG PO TABS: 1.0000 | ORAL_TABLET | Freq: Every day | ORAL | 1 refills | Status: DC

## 2017-06-30 NOTE — Progress Notes (Signed)
Subjective:    Patient ID: Megan Lang, female    DOB: 1971-09-15, 46 y.o.   MRN: [id number]   Chief Complaint  Patient presents with  . Hypertension    f/u   HPI  Megan Lang is a 46 yo woman who is here to follow-up on her HTN.  HTN: Monitors BP outside office daily and at last visit 1 mo pror was above goal on amlodipine 5, hctz 6.25 qd but BP was uncontrolled. Pt had been on bisoprolol-hctz and it had fallen of her list so we restarted bisoprolol-hctz and cont her on amlodipine at last visit 1 mo prior.  Checking her BP at home and has been running 120s/80s. Occ lightheadedness or slight brief HAs but less chest pain, palps. Did see cardiology Megan Lang in early 2017 - 1 1/2  Yrs ago - had 10/2015 exercise Cardiolite that was negative for ischemia and an echo with normal systolic fxn and no diastolic dysfxn. She feels her current sxs are actually better than they were back then. She was started on a ppi bid and sxs resolved. Vascular recommended adding in losartan if diastolic BP >93.    GERD: Severe w/ HH but sxs relatively well-controlled now that she is on zantac. HAs, n/v, hot flashes all resolved since she came off the omeprazole 40mg  last mos. seen by Megan Lang. Neg H. Pylor 07/2015. She has tried to come off of it for the past 2 wks.  Pt had an initial episode of diverticulitis 2 wks ago - was initially seen here by my college Megan Lang and then in the ER several d later. She has an appointment with Megan Lang the next month. She is making sure she is getting lots of fiber in her diet.   Chronic left flank pain: with swelling started after heavy lifting 1 yr prior 06/2016 and failed oral nsaid and meloxicam prev due to gastritis. Seen 3 mos prior for an exacerbation and put on topical diclofenac (very expensive so rec otc salon pas lidocaine patches or aspercreme instead.)  Seen by massage therapist sev times prior but worsens with palpation so was referred to PT  after t- and l-spine xrays shows some mild wear-and tear changes throughout.  She did complete PT and got some improvement but not complete resolution - was at the [facility] PT.  She did have muscle spasms in her left back yesterday treated with stretching/exericise.  Scoliosis was a new diagnosis and she does trip over her left foot prior. Advised pt to call for referral to Spine and Scoliosis or ortho spine whenever she was ready;  Asthma: intermittent, has alb at home - has been told she doesn't actually have asthma by Megan Lang - last appt 10/2015 but then doesn't know what is wrong with her breathing. She does have a sinus blockage that caused a cough from last May to Sept last year so she had sinus surgery 09/2015 which didn't go well in recovery.   Thoracic aortic aneurysm: 4.1 cm fusiform ascending aneurysm diagnosed 2017, unchanged 11/2016 - follow with vascular annually with CTA. Lipid panel 1 yr prior fantastic with LDL 68 and non-HDL 83  Obesity BMI 30-32: no prior a1c. SHe joined a gym last mo and have been trying to decrease in fried foods.  Past Medical History:  Diagnosis Date  . Acid reflux   . Allergy   . Aortic aneurysm ([place])   . Asthma   . Cardiomegaly   .  Carpal tunnel syndrome   . Gastric polyp   . GERD (gastroesophageal reflux disease) 01/23/2016  . Hiatal hernia   . Hypertension   . Normal spontaneous vaginal delivery    3   Past Surgical History:  Procedure Laterality Date  . CARPAL TUNNEL RELEASE  2009  . CARPAL TUNNEL RELEASE Left   . COLONOSCOPY    . NASAL SINUS SURGERY     DEC 15,2016   Current Outpatient Prescriptions on File Prior to Visit  Medication Sig Dispense Refill  . acetaminophen (TYLENOL) 500 MG tablet Take 500 mg by mouth every 4 (four) hours as needed for moderate pain.     Marland Kitchen albuterol (PROVENTIL HFA;VENTOLIN HFA) 108 (90 Base) MCG/ACT inhaler Inhale 1-2 puffs into the lungs every 6 (six) hours as needed for wheezing or shortness of  breath. 1 Inhaler 0  . amLODipine (NORVASC) 5 MG tablet Take 1 tablet (5 mg total) by mouth daily. 90 tablet 0  . bisoprolol-hydrochlorothiazide (ZIAC) 5-6.25 MG tablet Take 1 tablet by mouth daily. 90 tablet 0  . ciprofloxacin (CIPRO) 500 MG tablet Take 1 tablet (500 mg total) by mouth 2 (two) times daily. 14 tablet 0  . etonogestrel (NEXPLANON) 68 MG IMPL implant 1 each by Subdermal route once.    Marland Kitchen HYDROcodone-acetaminophen (NORCO/VICODIN) 5-325 MG tablet Take 1 tablet by mouth every 4 (four) hours as needed for moderate pain. 12 tablet 0  . metroNIDAZOLE (FLAGYL) 500 MG tablet Take 1 tablet (500 mg total) by mouth 2 (two) times daily. 14 tablet 0  . Multiple Vitamin (THERA) TABS Take 1 tablet by mouth daily.     . ondansetron (ZOFRAN ODT) 8 MG disintegrating tablet Take 1 tablet (8 mg total) by mouth every 8 (eight) hours as needed for nausea or vomiting. 10 tablet 0  . sodium chloride (OCEAN) 0.65 % SOLN nasal spray Place 1 spray into both nostrils 2 (two) times daily.     No current facility-administered medications on file prior to visit.    Allergies  Allergen Reactions  . Clindamycin Diarrhea and Other (See Comments)    GI upset  . Codeine Other (See Comments)    TACHYCARDIA   . Levofloxacin Other (See Comments)    other  . Metronidazole Other (See Comments)    Other  . Minocin [Minocycline Hcl] Other (See Comments)    Stomach Pains  . Moxifloxacin Swelling    other  . Penicillins Rash    Has patient had a PCN reaction causing immediate rash, facial/tongue/throat swelling, SOB or lightheadedness with hypotension:yes Has patient had a PCN reaction causing severe rash involving mucus membranes or skin necrosis:no Has patient had a PCN reaction that required hospitalization:no Has patient had a PCN reaction occurring within the last 10 years:No If all of the above answers are "NO", then may proceed with Cephalosporin   Family History  Problem Relation Age of Onset  .  Hypertension Mother   . Kidney disease Father   . Liver disease Father        Unknown diagnosis  . Breast cancer Maternal Grandmother   . Hypertension Sister   . Hypertension Brother    Social History   Social History  . Marital status: Married    Spouse name: N/A  . Number of children: 3  . 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 Smoking status: Never Smoker  . Smokeless tobacco: Never Used  .  Alcohol use No  . Drug use: No  . 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 Not on file   Social History Narrative   Epworth Sleepiness Scale = 8 (as of 10/28/2015)   Depression screen [facility] 2/9 06/30/2017 06/17/2017 05/31/2017 02/19/2017 12/16/2016  Decreased Interest 0 0 0 0 0  Down, Depressed, Hopeless 0 0 0 0 0  PHQ - 2 Score 0 0 0 0 0    Review of Systems See hpi    Objective:   Physical Exam  Constitutional: She is oriented to person, place, and time. She appears well-developed and well-nourished. No distress.  HENT:  Head: Normocephalic and atraumatic.  Right Ear: External ear normal.  Left Ear: External ear normal.  Eyes: Conjunctivae are normal. No scleral icterus.  Neck: Normal range of motion. Neck supple. No thyromegaly present.  Cardiovascular: Normal rate, regular rhythm, normal heart sounds and intact distal pulses.   Pulmonary/Chest: Effort normal and breath sounds normal. No respiratory distress.  Musculoskeletal: She exhibits no edema.  Lymphadenopathy:    She has no cervical adenopathy.  Neurological: She is alert and oriented to person, place, and time.  Skin: Skin is warm and dry. She is not diaphoretic. No erythema.  Psychiatric: She has a normal mood and affect. Her behavior is normal.  90     BP 132/80 (BP Location: Left Arm, Cuff Size: Large)   Pulse 79   Temp 99.2 F (37.3 C) (Oral)   Resp 16   Ht 5\' 3"  (1.6 m)   Wt 182 lb 6.4  oz (82.7 kg)   LMP 06/01/2017 Comment: negative hcg blood test 06-20-2017  SpO2 100%   BMI 32.31 kg/m    Assessment & Plan:  Vascular notes that pt should be started on losartan if diastolic BP >54   1. Essential hypertension  - improved on current regimen   2. Hypokalemia - continue, tends towards low K due to hctz 6.25  3. Diverticulitis of colon without hemorrhage - high fiber diet. Can consider low residue if she prefers. All sxs resolved. Has f/u w/ GI next mo.  4. Scoliosis (and kyphoscoliosis), idiopathic - Rec referral to ortho spine (Spine and Scoliosis) - call when needed  5. Chronic bilateral back pain, unspecified back location      Meds ordered this encounter  Medications  . ranitidine (ZANTAC) 150 MG capsule    Sig: Take 150 mg by mouth daily.  . bisoprolol-hydrochlorothiazide (ZIAC) 5-6.25 MG tablet    Sig: Take 1 tablet by mouth daily.    Dispense:  90 tablet    Refill:  1    Please add on refills to current prescription.  Marland Kitchen amLODipine (NORVASC) 5 MG tablet    Sig: Take 1 tablet (5 mg total) by mouth daily.    Dispense:  90 tablet    Refill:  1    Please add on refills to current prescription.    Delman Cheadle, M.D.  Primary Care at [facility] [address] [place], [place] [phone] phone [phone] fax  06/30/17 1:28 PM

## 2017-06-30 NOTE — Patient Instructions (Addendum)
IF you received an x-ray today, you will receive an invoice from [facility]. Please contact [facility] at [phone] with questions or concerns regarding your invoice.   IF you received labwork today, you will receive an invoice from [facility]. Please contact [facility] at [phone] with questions or concerns regarding your invoice.   Our billing staff will not be able to assist you with questions regarding bills from these companies.  You will be contacted with the lab results as soon as they are available. The fastest way to get your results is to activate your My Chart account. Instructions are located on the last page of this paperwork. If you have not heard from [place] regarding the results in 2 weeks, please contact this office.     High-Fiber Diet Fiber, also called dietary fiber, is a type of carbohydrate found in fruits, vegetables, whole grains, and beans. A high-fiber diet can have many health benefits. Your health care provider may recommend a high-fiber diet to help:  Prevent constipation. Fiber can make your bowel movements more regular.  Lower your cholesterol.  Relieve hemorrhoids, uncomplicated diverticulosis, or irritable bowel syndrome.  Prevent overeating as part of a weight-loss plan.  Prevent heart disease, type 2 diabetes, and certain cancers.  What is my plan? The recommended daily intake of fiber includes:  38 grams for men under age 50.  65 grams for men over age 12.  72 grams for women under age 43.  57 grams for women over age 39.  You can get the recommended daily intake of dietary fiber by eating a variety of fruits, vegetables, grains, and beans. Your health care provider may also recommend a fiber supplement if it is not possible to get enough fiber through your diet. What do I need to know about a high-fiber diet?  Fiber supplements have not been widely studied for their effectiveness, so it is better to get fiber through  food sources.  Always check the fiber content on thenutrition facts label of any prepackaged food. Look for foods that contain at least 5 grams of fiber per serving.  Ask your dietitian if you have questions about specific foods that are related to your condition, especially if those foods are not listed in the following section.  Increase your daily fiber consumption gradually. Increasing your intake of dietary fiber too quickly may cause bloating, cramping, or gas.  Drink plenty of water. Water helps you to digest fiber. What foods can I eat? Grains Whole-grain breads. Multigrain cereal. Oats and oatmeal. Brown rice. Barley. Bulgur wheat. [place]. Bran muffins. Popcorn. Rye wafer crackers. Vegetables Sweet potatoes. Spinach. Kale. Artichokes. Cabbage. Broccoli. Green peas. Carrots. Squash. Fruits Berries. Pears. Apples. Oranges. Avocados. Prunes and raisins. Dried figs. Meats and Other Protein Sources [place], kidney, pinto, and soy beans. Split peas. Lentils. Nuts and seeds. Dairy Fiber-fortified yogurt. Beverages Fiber-fortified soy milk. Fiber-fortified orange juice. Other Fiber bars. The items listed above may not be a complete list of recommended foods or beverages. Contact your dietitian for more options. What foods are not recommended? Grains White bread. Pasta made with refined flour. White rice. Vegetables Fried potatoes. Canned vegetables. Well-cooked vegetables. Fruits Fruit juice. Cooked, strained fruit. Meats and Other Protein Sources Fatty cuts of meat. Fried Sales executive or fried fish. Dairy Milk. Yogurt. Cream cheese. Sour cream. Beverages Soft drinks. Other Cakes and pastries. Butter and oils. The items listed above may not be a complete list of foods and beverages to avoid. Contact your dietitian  for more information. What are some tips for including high-fiber foods in my diet?  Eat a wide variety of high-fiber foods.  Make sure that half of all grains  consumed each day are whole grains.  Replace breads and cereals made from refined flour or white flour with whole-grain breads and cereals.  Replace white rice with brown rice, bulgur wheat, or millet.  Start the day with a breakfast that is high in fiber, such as a cereal that contains at least 5 grams of fiber per serving.  Use beans in place of meat in soups, salads, or pasta.  Eat high-fiber snacks, such as berries, raw vegetables, nuts, or popcorn. This information is not intended to replace advice given to you by your health care provider. Make sure you discuss any questions you have with your health care provider. Document Released: 10/04/2005 Document Revised: 03/11/2016 Document Reviewed: 03/19/2014 Elsevier Interactive Patient Education  2017 [employer].  Potassium Content of Foods Potassium is a mineral found in many foods and drinks. It helps keep fluids and minerals balanced in your body and affects how steadily your heart beats. Potassium also helps control your blood pressure and keep your muscles and nervous system healthy. Certain health conditions and medicines may change the balance of potassium in your body. When this happens, you can help balance your level of potassium through the foods that you do or do not eat. Your health care provider or dietitian may recommend an amount of potassium that you should have each day. The following lists of foods provide the amount of potassium (in parentheses) per serving in each item. High in potassium The following foods and beverages have 200 mg or more of potassium per serving:  Apricots, 2 raw or 5 dry (200 mg).  Artichoke, 1 medium (345 mg).  Avocado, raw,  each (245 mg).  Banana, 1 medium (425 mg).  Beans, lima, or baked beans, canned,  cup (280 mg).  Beans, white, canned,  cup (595 mg).  Beef roast, 3 oz (320 mg).  Beef, ground, 3 oz (270 mg).  Beets, raw or cooked,  cup (260 mg).  Bran muffin, 2 oz (300  mg).  Broccoli,  cup (230 mg).  Brussels sprouts,  cup (250 mg).  Cantaloupe,  cup (215 mg).  Cereal, 100% bran,  cup (200-400 mg).  Cheeseburger, single, fast food, 1 each (225-400 mg).  Chicken, 3 oz (220 mg).  Clams, canned, 3 oz (535 mg).  Crab, 3 oz (225 mg).  Dates, 5 each (270 mg).  Dried beans and peas,  cup (300-475 mg).  Figs, dried, 2 each (260 mg).  Fish: halibut, tuna, cod, snapper, 3 oz (480 mg).  Fish: salmon, haddock, swordfish, perch, 3 oz (300 mg).  Fish, tuna, canned 3 oz (200 mg).  [place] fries, fast food, 3 oz (470 mg).  Granola with fruit and nuts,  cup (200 mg).  Grapefruit juice,  cup (200 mg).  Greens, beet,  cup (655 mg).  Honeydew melon,  cup (200 mg).  Kale, raw, 1 cup (300 mg).  Kiwi, 1 medium (240 mg).  Kohlrabi, rutabaga, parsnips,  cup (280 mg).  Lentils,  cup (365 mg).  Mango, 1 each (325 mg).  Milk, chocolate, 1 cup (420 mg).  Milk: nonfat, low-fat, whole, buttermilk, 1 cup (350-380 mg).  Molasses, 1 Tbsp (295 mg).  Mushrooms,  cup (280) mg.  Nectarine, 1 each (275 mg).  Nuts: almonds, peanuts, hazelnuts, [place], cashew, mixed, 1 oz (200 mg).  Nuts,  pistachios, 1 oz (295 mg).  Orange, 1 each (240 mg).  Orange juice,  cup (235 mg).  Papaya, medium,  fruit (390 mg).  Peanut butter, chunky, 2 Tbsp (240 mg).  Peanut butter, smooth, 2 Tbsp (210 mg).  Pear, 1 medium (200 mg).  Pomegranate, 1 whole (400 mg).  Pomegranate juice,  cup (215 mg).  Pork, 3 oz (350 mg).  Potato chips, salted, 1 oz (465 mg).  Potato, baked with skin, 1 medium (925 mg).  Potatoes, boiled,  cup (255 mg).  Potatoes, mashed,  cup (330 mg).  Prune juice,  cup (370 mg).  Prunes, 5 each (305 mg).  Pudding, chocolate,  cup (230 mg).  Pumpkin, canned,  cup (250 mg).  Raisins, seedless,  cup (270 mg).  Seeds, sunflower or pumpkin, 1 oz (240 mg).  Soy milk, 1 cup (300 mg).  Spinach,  cup (420  mg).  Spinach, canned,  cup (370 mg).  Sweet potato, baked with skin, 1 medium (450 mg).  Swiss chard,  cup (480 mg).  Tomato or vegetable juice,  cup (275 mg).  Tomato sauce or puree,  cup (400-550 mg).  Tomato, raw, 1 medium (290 mg).  Tomatoes, canned,  cup (200-300 mg).  [place], 3 oz (250 mg).  Wheat germ, 1 oz (250 mg).  Winter squash,  cup (250 mg).  Yogurt, plain or fruited, 6 oz (260-435 mg).  Zucchini,  cup (220 mg).  Moderate in potassium The following foods and beverages have 50-200 mg of potassium per serving:  Apple, 1 each (150 mg).  Apple juice,  cup (150 mg).  Applesauce,  cup (90 mg).  Apricot nectar,  cup (140 mg).  Asparagus, small spears,  cup or 6 spears (155 mg).  Bagel, cinnamon raisin, 1 each (130 mg).  Bagel, egg or plain, 4 in., 1 each (70 mg).  Beans, green,  cup (90 mg).  Beans, yellow,  cup (190 mg).  Beer, regular, 12 oz (100 mg).  Beets, canned,  cup (125 mg).  Blackberries,  cup (115 mg).  Blueberries,  cup (60 mg).  Bread, whole wheat, 1 slice (70 mg).  Broccoli, raw,  cup (145 mg).  Cabbage,  cup (150 mg).  Carrots, cooked or raw,  cup (180 mg).  Cauliflower, raw,  cup (150 mg).  Celery, raw,  cup (155 mg).  Cereal, bran flakes, cup (120-150 mg).  Cheese, cottage,  cup (110 mg).  Cherries, 10 each (150 mg).  Chocolate, 1 oz bar (165 mg).  Coffee, brewed 6 oz (90 mg).  Corn,  cup or 1 ear (195 mg).  Cucumbers,  cup (80 mg).  Egg, large, 1 each (60 mg).  Eggplant,  cup (60 mg).  Endive, raw, cup (80 mg).  English muffin, 1 each (65 mg).  Fish, orange roughy, 3 oz (150 mg).  Frankfurter, beef or pork, 1 each (75 mg).  Fruit cocktail,  cup (115 mg).  Grape juice,  cup (170 mg).  Grapefruit,  fruit (175 mg).  Grapes,  cup (155 mg).  Greens: kale, turnip, collard,  cup (110-150 mg).  Ice cream or frozen yogurt, chocolate,  cup (175 mg).  Ice cream or  frozen yogurt, vanilla,  cup (120-150 mg).  Lemons, limes, 1 each (80 mg).  Lettuce, all types, 1 cup (100 mg).  Mixed vegetables,  cup (150 mg).  Mushrooms, raw,  cup (110 mg).  Nuts: walnuts, pecans, or macadamia, 1 oz (125 mg).  Oatmeal,  cup (80 mg).  Okra,  cup (  110 mg).  Onions, raw,  cup (120 mg).  Peach, 1 each (185 mg).  Peaches, canned,  cup (120 mg).  Pears, canned,  cup (120 mg).  Peas, green, frozen,  cup (90 mg).  Peppers, green,  cup (130 mg).  Peppers, red,  cup (160 mg).  Pineapple juice,  cup (165 mg).  Pineapple, fresh or canned,  cup (100 mg).  Plums, 1 each (105 mg).  Pudding, vanilla,  cup (150 mg).  Raspberries,  cup (90 mg).  Rhubarb,  cup (115 mg).  Rice, wild,  cup (80 mg).  Shrimp, 3 oz (155 mg).  Spinach, raw, 1 cup (170 mg).  Strawberries,  cup (125 mg).  Summer squash  cup (175-200 mg).  Swiss chard, raw, 1 cup (135 mg).  Tangerines, 1 each (140 mg).  Tea, brewed, 6 oz (65 mg).  Turnips,  cup (140 mg).  Watermelon,  cup (85 mg).  Wine, red, table, 5 oz (180 mg).  Wine, white, table, 5 oz (100 mg).  Low in potassium The following foods and beverages have less than 50 mg of potassium per serving.  Bread, white, 1 slice (30 mg).  Carbonated beverages, 12 oz (less than 5 mg).  Cheese, 1 oz (20-30 mg).  Cranberries,  cup (45 mg).  Cranberry juice cocktail,  cup (20 mg).  Fats and oils, 1 Tbsp (less than 5 mg).  Hummus, 1 Tbsp (32 mg).  Nectar: papaya, mango, or pear,  cup (35 mg).  Rice, white or brown,  cup (50 mg).  Spaghetti or macaroni,  cup cooked (30 mg).  Tortilla, flour or corn, 1 each (50 mg).  Waffle, 4 in., 1 each (50 mg).  Water chestnuts,  cup (40 mg).  This information is not intended to replace advice given to you by your health care provider. Make sure you discuss any questions you have with your health care provider. Document Released: 05/18/2005  Document Revised: 03/11/2016 Document Reviewed: 08/31/2013 Elsevier Interactive Patient Education  [employer].

## 2017-07-01 DIAGNOSIS — M4120 Other idiopathic scoliosis, site unspecified: Secondary | ICD-10-CM | POA: Insufficient documentation

## 2017-07-01 DIAGNOSIS — G8929 Other chronic pain: Secondary | ICD-10-CM | POA: Insufficient documentation

## 2017-07-01 DIAGNOSIS — M549 Dorsalgia, unspecified: Secondary | ICD-10-CM

## 2017-08-03 ENCOUNTER — Ambulatory Visit (INDEPENDENT_AMBULATORY_CARE_PROVIDER_SITE_OTHER): Payer: BC Managed Care – PPO | Admitting: Gastroenterology

## 2017-08-03 ENCOUNTER — Encounter: Payer: Self-pay | Admitting: Gastroenterology

## 2017-08-03 VITALS — BP 138/90 | HR 76 | Ht 62.25 in | Wt 180.4 lb

## 2017-08-03 DIAGNOSIS — K5792 Diverticulitis of intestine, part unspecified, without perforation or abscess without bleeding: Secondary | ICD-10-CM

## 2017-08-03 DIAGNOSIS — Z86010 Personal history of colonic polyps: Secondary | ICD-10-CM

## 2017-08-03 NOTE — Progress Notes (Signed)
HPI :  46 y/o female here for a follow up visit. Previously seen for GERD for which she has had a prior workup. Since her last visit she had a colonoscopy 06/16/16 - scattered pancolonic diverticulosis, 2 polyps removed - one adenoma, one HP - due for recall in 5 years.   Within 1-2 weeks after her colonoscopy she was diagnosed with diverticulitis in September. She states she developed LLQ pain which awoke her from sleep. She denied fevers. She had nausea but no vomiting. No blood in the stools. She had some constipation around the time of diagnosis. She was given antibiotics and her symptoms resolved. This was her first episode of diverticulitis. She has a rare LLQ cramp at times, but otherwise feels well without complaints. Of note, she had a small pulmonary nodule noted on this exam, stable over 22 months, radiology not recommending any further surveillance.   CT scan on 06/20/2017 - suggested descending colon diverticulitis, 61mm pulmonary nodule  Endoscopic history: Colonoscopy 06/16/16 - scattered pancolonic diverticulosis, 2 polyps removed - one adenoma, one HP - due for recall in 5 years.  EGD 11/07/15 - small hiatal hernia, 1cm, normal esophagus, normal exam other than small polyp hyperplastic    Past Medical History:  Diagnosis Date  . Acid reflux   . Allergy   . Aortic aneurysm ([place])   . Asthma   . Cardiomegaly   . Carpal tunnel syndrome   . Gastric polyp   . GERD (gastroesophageal reflux disease) 01/23/2016  . Hiatal hernia   . Hypertension   . Normal spontaneous vaginal delivery    3     Past Surgical History:  Procedure Laterality Date  . CARPAL TUNNEL RELEASE  2009  . CARPAL TUNNEL RELEASE Left   . COLONOSCOPY    . NASAL SINUS SURGERY     DEC 15,2016   Family History  Problem Relation Age of Onset  . Hypertension Mother   . Kidney disease Father   . Liver disease Father        Unknown diagnosis  . Breast cancer Maternal Grandmother   . Hypertension Sister   .  Hypertension Brother    Social History  Substance Use Topics  . Smoking status: Never Smoker  . Smokeless tobacco: Never Used  . Alcohol use No   Current Outpatient Prescriptions  Medication Sig Dispense Refill  . acetaminophen (TYLENOL) 500 MG tablet Take 500 mg by mouth every 4 (four) hours as needed for moderate pain.     Marland Kitchen amLODipine (NORVASC) 5 MG tablet Take 1 tablet (5 mg total) by mouth daily. 90 tablet 1  . bisoprolol-hydrochlorothiazide (ZIAC) 5-6.25 MG tablet Take 1 tablet by mouth daily. 90 tablet 1  . etonogestrel (NEXPLANON) 68 MG IMPL implant 1 each by Subdermal route once.    . Multiple Vitamin (THERA) TABS Take 1 tablet by mouth daily.     . ranitidine (ZANTAC) 150 MG capsule Take 150 mg by mouth daily.    . sodium chloride (OCEAN) 0.65 % SOLN nasal spray Place 1 spray into both nostrils 2 (two) times daily.    Marland Kitchen albuterol (PROVENTIL HFA;VENTOLIN HFA) 108 (90 Base) MCG/ACT inhaler Inhale 1-2 puffs into the lungs every 6 (six) hours as needed for wheezing or shortness of breath. (Patient not taking: Reported on 06/30/2017) 1 Inhaler 0  . ondansetron (ZOFRAN ODT) 8 MG disintegrating tablet Take 1 tablet (8 mg total) by mouth every 8 (eight) hours as needed for nausea or vomiting. (Patient not  taking: Reported on 08/03/2017) 10 tablet 0   No current facility-administered medications for this visit.    Allergies  Allergen Reactions  . Clindamycin Diarrhea and Other (See Comments)    GI upset  . Codeine Other (See Comments)    TACHYCARDIA   . Levofloxacin Other (See Comments)    other  . Metronidazole Other (See Comments)    Other  . Minocin [Minocycline Hcl] Other (See Comments)    Stomach Pains  . Moxifloxacin Swelling    other  . Penicillins Rash    Has patient had a PCN reaction causing immediate rash, facial/tongue/throat swelling, SOB or lightheadedness with hypotension:yes Has patient had a PCN reaction causing severe rash involving mucus membranes or skin  necrosis:no Has patient had a PCN reaction that required hospitalization:no Has patient had a PCN reaction occurring within the last 10 years:No If all of the above answers are "NO", then may proceed with Cephalosporin     Review of Systems: All systems reviewed and negative except where noted in HPI.   Lab Results  Component Value Date   WBC 5.8 06/20/2017   HGB 13.3 06/20/2017   HCT 38.5 06/20/2017   MCV 85.9 06/20/2017   PLT 263 06/20/2017    Lab Results  Component Value Date   ALT 18 06/20/2017   AST 19 06/20/2017   ALKPHOS 67 06/20/2017   BILITOT 0.7 06/20/2017    Lab Results  Component Value Date   CREATININE 0.77 06/20/2017   BUN 11 06/20/2017   NA 136 06/20/2017   K 3.4 (L) 06/20/2017   CL 104 06/20/2017   CO2 24 06/20/2017     Physical Exam: BP 138/90 (BP Location: Left Arm, Patient Position: Sitting, Cuff Size: Normal)   Pulse 76   Ht 5' 2.25" (1.581 m) Comment: height measured without shoes  Wt 180 lb 6 oz (81.8 kg)   BMI 32.73 kg/m  Constitutional: Pleasant,well-developed, female in no acute distress. HEENT: Normocephalic and atraumatic. Conjunctivae are normal. No scleral icterus. Neck supple.  Cardiovascular: Normal rate, regular rhythm.  Pulmonary/chest: Effort normal and breath sounds normal. No wheezing, rales or rhonchi. Abdominal: Soft, nondistended, nontender.There are no masses palpable. No hepatomegaly. Extremities: no edema Lymphadenopathy: No cervical adenopathy noted. Neurological: Alert and oriented to person place and time. Skin: Skin is warm and dry. No rashes noted. Psychiatric: Normal mood and affect. Behavior is normal.   ASSESSMENT AND PLAN: 46 y/o female here for reassessment of the following episode of diverticulitis:  Diverticulitis - noted on CT scan in the descending colon, not too long after her first colonoscopy which showed pancolonic diverticulosis. She responded appropriately to antibiotics, having some rare  intermittent LLQ cramps at this time but continuing to improve. We discussed diverticulitis in general, long term increased risk for recurrence. Given this was her first episode, will monitor for recurrent episodes at this time. If she has recurrence I asked her to contact me for low threshold for antibiotics. Otherwise recommend a daily fiber supplement if she has some mild constipation. If her pain / spasms bother her moving forward we can give her some bentyl, she declined this for now. Her colonoscopy was very recent, thus she does not warrant a colonoscopy at this time. Next due for surveillance colonoscopy in 2023. She agreed with the plan, all questions answered. Chewey Cellar, MD [facility] Pager [phone]

## 2017-08-03 NOTE — Patient Instructions (Signed)
Please take Citrucel daily.  Call us at [phone] if your symptoms persist.  You will be due for a colonoscopy in 2023. We will contact you as the time approaches to schedule.   If you are age 46 or older, your body mass index should be between 23-30. Your Body mass index is 32.73 kg/m. If this is out of the aforementioned range listed, please consider follow up with your Primary Care Provider.  If you are age 64 or younger, your body mass index should be between 19-25. Your Body mass index is 32.73 kg/m. If this is out of the aformentioned range listed, please consider follow up with your Primary Care Provider.   Thank you.

## 2017-08-09 ENCOUNTER — Telehealth: Payer: Self-pay | Admitting: Gastroenterology

## 2017-08-09 NOTE — Telephone Encounter (Signed)
We can give her bentyl 10mg  tablet - 1-2 tabs every 8 hrs PRN #30, RF3. She can call if no improvement. Thanks

## 2017-08-09 NOTE — Telephone Encounter (Signed)
Spoke to patient, she was seen last week and at the time did not have any abdominal spasms but does now and would like to try the medication offered.

## 2017-08-10 ENCOUNTER — Other Ambulatory Visit: Payer: Self-pay

## 2017-08-10 MED ORDER — DICYCLOMINE HCL 10 MG PO CAPS: 10.0000 mg | ORAL_CAPSULE | ORAL | 3 refills | Status: DC

## 2017-08-10 NOTE — Telephone Encounter (Signed)
Called patient, let her know that Rx was sent to her pharmacy for the abdominal cramping. She understands to call our office back if no improvement in a couple of weeks, or sooner if symptoms worsen.

## 2017-08-13 ENCOUNTER — Ambulatory Visit: Payer: BC Managed Care – PPO | Admitting: Osteopathic Medicine

## 2017-08-20 ENCOUNTER — Ambulatory Visit: Payer: BC Managed Care – PPO | Admitting: Family Medicine

## 2017-08-24 ENCOUNTER — Telehealth: Payer: Self-pay | Admitting: Family Medicine

## 2017-08-24 NOTE — Telephone Encounter (Signed)
Pt advised that she was given a 67mo supply in September. Verified with pharmacy.

## 2017-08-24 NOTE — Telephone Encounter (Signed)
Copied from [place] [id number]. Topic: Inquiry >> Aug 24, 2017 12:43 PM Oliver Pila B wrote: Reason for CRM: PT called to have the bisoprolol-hydrochlorothiazide refilled, please contact and advise pt or have the pharmacy contact pt when Rx is ready

## 2017-08-30 ENCOUNTER — Other Ambulatory Visit: Payer: Self-pay | Admitting: Family Medicine

## 2017-08-30 NOTE — Telephone Encounter (Signed)
Refill done today 08/30/17

## 2017-08-30 NOTE — Telephone Encounter (Signed)
Copied from [place] [phone]. Topic: Quick Communication - See Telephone Encounter >> Aug 30, 2017 10:32 AM Patrice Paradise wrote: Please give the patient a call about her bisoprolol-hydrochlorothiazide 5-6.25 mg tablet. Patient stated the [facility] at [address] is saying she is out of refill. She took her last pill today, really needs the refill.

## 2017-10-24 ENCOUNTER — Ambulatory Visit: Payer: Self-pay | Admitting: *Deleted

## 2017-10-24 ENCOUNTER — Ambulatory Visit: Payer: BC Managed Care – PPO | Admitting: Physician Assistant

## 2017-10-24 ENCOUNTER — Encounter: Payer: Self-pay | Admitting: Physician Assistant

## 2017-10-24 ENCOUNTER — Other Ambulatory Visit: Payer: Self-pay

## 2017-10-24 ENCOUNTER — Ambulatory Visit (INDEPENDENT_AMBULATORY_CARE_PROVIDER_SITE_OTHER): Payer: BC Managed Care – PPO

## 2017-10-24 VITALS — BP 148/91 | HR 82 | Temp 98.4°F | Resp 16 | Ht 62.0 in | Wt 179.0 lb

## 2017-10-24 DIAGNOSIS — Z23 Encounter for immunization: Secondary | ICD-10-CM | POA: Diagnosis not present

## 2017-10-24 DIAGNOSIS — M25512 Pain in left shoulder: Secondary | ICD-10-CM

## 2017-10-24 MED ORDER — CYCLOBENZAPRINE HCL 10 MG PO TABS: 5.0000 mg | ORAL_TABLET | Freq: Three times a day (TID) | ORAL | 0 refills | Status: DC | PRN

## 2017-10-24 MED ORDER — MELOXICAM 15 MG PO TABS: 15.0000 mg | ORAL_TABLET | Freq: Every day | ORAL | 0 refills | Status: DC

## 2017-10-24 NOTE — Progress Notes (Signed)
PRIMARY CARE AT [facility] [address], [place] [address] [phone]  Date:  10/24/2017   Name:  Megan Lang   DOB:  1971-05-14   MRN:  [id number]  PCP:  Shawnee Knapp, MD    History of Present Illness:  Megan Lang is a 48 y.o. female patient who presents to PCP with  Chief Complaint  Patient presents with  . Shoulder Pain    x saturday, pt states pain goes down into elbow     She has been having pain of her left shoulder for 2 days.  Intermittent.  It is on the side of her shoulder.  It will radiate to her elbow.  No numbness or tingling.  She does not recall any trauma, or holding heavy shoulder pain.    Patient Active Problem List   Diagnosis Date Noted  . Scoliosis (and kyphoscoliosis), idiopathic 07/01/2017  . Chronic bilateral back pain 07/01/2017  . Nexplanon in place 07/29/2016  . Cardiomegaly 07/01/2016  . History of aortic aneurysm 07/01/2016  . GERD (gastroesophageal reflux disease) 01/23/2016  . Dyspnea 10/27/2015  . Costochondritis 08/26/2015  . Obesity 07/01/2015  . Upper airway cough syndrome 06/30/2015  . History of shingles 01/26/2015  . Family history of colon cancer 10/27/2011  . Essential hypertension 10/27/2011    Past Medical History:  Diagnosis Date  . Acid reflux   . Allergy   . Aortic aneurysm ([place])   . Asthma   . Cardiomegaly   . Carpal tunnel syndrome   . Gastric polyp   . GERD (gastroesophageal reflux disease) 01/23/2016  . Hiatal hernia   . Hypertension   . Normal spontaneous vaginal delivery    3    Past Surgical History:  Procedure Laterality Date  . CARPAL TUNNEL RELEASE  2009  . CARPAL TUNNEL RELEASE Left   . COLONOSCOPY    . NASAL SINUS SURGERY     DEC 15,2016    Social History   Tobacco Use  . Smoking status: Never Smoker  . Smokeless tobacco: Never Used  Substance Use Topics  . Alcohol use: No    Alcohol/week: 0.0 oz  . Drug use: No    Family History  Problem Relation Age of Onset  . Hypertension  Mother   . Kidney disease Father   . Liver disease Father        Unknown diagnosis  . Breast cancer Maternal Grandmother   . Hypertension Sister   . Hypertension Brother     Allergies  Allergen Reactions  . Clindamycin Diarrhea and Other (See Comments)    GI upset  . Codeine Other (See Comments)    TACHYCARDIA   . Levofloxacin Other (See Comments)    other  . Metronidazole Other (See Comments)    Other  . Minocin [Minocycline Hcl] Other (See Comments)    Stomach Pains  . Moxifloxacin Swelling    other  . Penicillins Rash    Has patient had a PCN reaction causing immediate rash, facial/tongue/throat swelling, SOB or lightheadedness with hypotension:yes Has patient had a PCN reaction causing severe rash involving mucus membranes or skin necrosis:no Has patient had a PCN reaction that required hospitalization:no Has patient had a PCN reaction occurring within the last 10 years:No If all of the above answers are "NO", then may proceed with Cephalosporin    Medication list has been reviewed and updated.  Current Outpatient Medications on File Prior to Visit  Medication Sig Dispense Refill  . acetaminophen (TYLENOL)  500 MG tablet Take 500 mg by mouth every 4 (four) hours as needed for moderate pain.     Marland Kitchen amLODipine (NORVASC) 5 MG tablet Take 1 tablet (5 mg total) by mouth daily. 90 tablet 1  . bisoprolol-hydrochlorothiazide (ZIAC) 5-6.25 MG tablet Take 1 tablet by mouth daily. 90 tablet 1  . bisoprolol-hydrochlorothiazide (ZIAC) 5-6.25 MG tablet TAKE 1 TABLET BY MOUTH ONCE DAILY 90 tablet 0  . dicyclomine (BENTYL) 10 MG capsule Take 1 capsule (10 mg total) by mouth as directed. May take 1-2 caps every 8 hours as needed for abdominal cramping 30 capsule 3  . etonogestrel (NEXPLANON) 68 MG IMPL implant 1 each by Subdermal route once.    . Multiple Vitamin (THERA) TABS Take 1 tablet by mouth daily.     . ranitidine (ZANTAC) 150 MG capsule Take 150 mg by mouth daily.    . sodium  chloride (OCEAN) 0.65 % SOLN nasal spray Place 1 spray into both nostrils 2 (two) times daily.    Marland Kitchen albuterol (PROVENTIL HFA;VENTOLIN HFA) 108 (90 Base) MCG/ACT inhaler Inhale 1-2 puffs into the lungs every 6 (six) hours as needed for wheezing or shortness of breath. (Patient not taking: Reported on 06/30/2017) 1 Inhaler 0  . ondansetron (ZOFRAN ODT) 8 MG disintegrating tablet Take 1 tablet (8 mg total) by mouth every 8 (eight) hours as needed for nausea or vomiting. (Patient not taking: Reported on 08/03/2017) 10 tablet 0   No current facility-administered medications on file prior to visit.     ROS ROS otherwise unremarkable unless listed above.  Physical Examination: BP (!) 148/91   Pulse 82   Temp 98.4 F (36.9 C) (Oral)   Resp 16   Ht 5\' 2"  (1.575 m)   Wt 179 lb (81.2 kg)   LMP 10/06/2016   SpO2 95%   BMI 32.74 kg/m  Ideal Body Weight: Weight in (lb) to have BMI = 25: 136.4  Physical Exam  Constitutional: She is oriented to person, place, and time. She appears well-developed and well-nourished. No distress.  HENT:  Head: Normocephalic and atraumatic.  Right Ear: External ear normal.  Left Ear: External ear normal.  Eyes: Conjunctivae and EOM are normal. Pupils are equal, round, and reactive to light.  Cardiovascular: Normal rate.  Pulmonary/Chest: Effort normal. No respiratory distress.  Musculoskeletal:  Tender over the ac joint and bicipital groove.   Pain incited with shoulder abduction though this was normal rom.  Normal strength throughout the extremity.    Neurological: She is alert and oriented to person, place, and time.  Skin: She is not diaphoretic.  Psychiatric: She has a normal mood and affect. Her behavior is normal.   Dg Shoulder Left  Result Date: 10/24/2017 CLINICAL DATA:  Acromioclavicular tenderness. EXAM: LEFT SHOULDER - 2+ VIEW COMPARISON:  None. FINDINGS: There is no evidence of fracture or dislocation. There is no evidence of arthropathy or other  focal bone abnormality. Soft tissues are unremarkable. IMPRESSION: Negative. Electronically Signed   By: Fidela Salisbury M.D.   On: 10/24/2017 17:36     Assessment and Plan: Megan Lang is a 47 y.o. female who is here today for cc of  Chief Complaint  Patient presents with  . Shoulder Pain    x saturday, pt states pain goes down into elbow  biceps tendinitis vs bursitis vs shoulder strain Given meloxicam.  Discussed precautions Advised icing regimen. msk relaxant given with precautions.  Follow up as needed.  Acute pain of left shoulder -  Plan: DG Shoulder Left  Need for influenza vaccination - Plan: Flu Vaccine QUAD 36+ mos IM  Ivar Drape, PA-C [facility] 1/7/20195:43 PM

## 2017-10-24 NOTE — Telephone Encounter (Signed)
Patient states she has had shoulder pain for 3 days- she thinks she may have pulled a muscle- but because of the location and radiation of the pain down in the arm- she needs evaluation. Reviewed signs of emergent problem and she understands.   Reason for Disposition . [1] Shoulder pains with exertion (e.g., walking) AND [2] pain goes away on resting AND [3] not present now  Answer Assessment - Initial Assessment Questions 1. ONSET: "When did the pain start?"     Saturday morning 2. LOCATION: "Where is the pain located?"     Left shoulder- collarbone area- hurts worse with movement- aches 3. PAIN: "How bad is the pain?" (Scale 1-10; or mild, moderate, severe)   - MILD (1-3): doesn't interfere with normal activities   - MODERATE (4-7): interferes with normal activities (e.g., work or school) or awakens from sleep   - SEVERE (8-10): excruciating pain, unable to do any normal activities, unable to move arm at all due to pain     7 4. WORK OR EXERCISE: "Has there been any recent work or exercise that involved this part of the body?"     Patient mops at work 5. CAUSE: "What do you think is causing the shoulder pain?"     Patient is using heat and tylenol- wakes her at night 6. OTHER SYMPTOMS: "Do you have any other symptoms?" (e.g., neck pain, swelling, rash, fever, numbness, weakness)     radiating to elbow 7. PREGNANCY: "Is there any chance you are pregnant?" "When was your last menstrual period?"     n/a  Protocols used: SHOULDER PAIN-A-AH

## 2017-10-24 NOTE — Patient Instructions (Addendum)
I would like you to ice the shoulder three times per day for 15 minutes over the next 24 hours. Next, you will do the icing regimen, but following stretches.  I would like you to pick three stretches and do them three times per day (total 9 stretches).  You will ice directly after you perform the stretches, thus three times per day. Do not take the mobic with naproxen or ibuprofen.  You can take tylenol Do not take the flexeril with operating heavy machinery.  This can make you very drowsy.  Biceps Tendon Tendinitis (Proximal) and Tenosynovitis Rehab Ask your health care provider which exercises are safe for you. Do exercises exactly as told by your health care provider and adjust them as directed. It is normal to feel mild stretching, pulling, tightness, or discomfort as you do these exercises, but you should stop right away if you feel sudden pain or your pain gets worse.Do not begin these exercises until told by your health care provider. Stretching and range of motion exercises These exercises warm up your muscles and joints and improve the movement and flexibility of your arm and shoulder. These exercises also help to relieve pain and stiffness. Exercise A: Shoulder flexion  1. Stand facing a wall. Put your left / right hand on the wall. 2. Slide your left / right hand up the wall. Stop when you feel a stretch in your shoulder, or when you reach the angle that is recommended by your health care provider. ? Use your other hand to help raise your arm, if needed. ? As your hand gets higher, you may need to step closer to the wall. ? Avoid shrugging your shoulder while you raise your arm. To do this, keep your shoulder blade tucked down toward your spine. 3. Hold for __________ seconds. 4. Slowly return to the starting position. Use your other arm to help, if needed. Repeat __________ times. Complete this exercise __________ times a day. Exercise B: Posterior capsule stretch ( passive  horizontal adduction) 1. Sit or stand and pull your left / right elbow across your chest, toward your other shoulder. Stop when you feel a gentle stretch in the back of your shoulder and upper arm. ? Keep your arm at shoulder height. ? Keep your arm as close to your body as you comfortably can. 2. Hold for __________ seconds. 3. Slowly return to the starting position. Repeat __________ times. Complete this exercise __________ times a day. Strengthening exercises These exercises build strength and endurance in your arm and shoulder. Endurance is the ability to use your muscles for a long time, even after your muscles get tired. Exercise C: Elbow flexion, supinated  1. Sit on a stable chair without armrests, or stand. 2. If directed, hold a __________ weight in your left / right hand, or hold an exercise band with both hands. Your palms should face up toward the ceiling at the starting position. 3. Bend your left / right elbow and move your hand up toward your shoulder. Keep your other arm straight down, in the starting position. 4. Slowly return to the starting position. Repeat __________ times. Complete this exercise __________ times a day. Exercise D: Scapular protraction, supine  1. Lie on your back on a firm surface. If directed, hold a __________ weight in your left / right hand. 2. Raise your left / right arm straight into the air so your hand is directly above your shoulder joint. 3. Push the weight into the air so  your shoulder lifts off of the surface that you are lying on. Do not move your head, neck, or back. 4. Hold for __________ seconds. 5. Slowly return to the starting position. Let your muscles relax completely before you repeat this exercise. Repeat __________ times. Complete this exercise __________ times a day. Exercise E: Scapular retraction  1. Sit in a stable chair without armrests, or stand. 2. Secure an exercise band to a stable object in front of you so the band is  at shoulder height. 3. Hold one end of the exercise band in each hand. 4. Squeeze your shoulder blades together and move your elbows slightly behind you. Do not shrug your shoulders. 5. Hold for __________ seconds. 6. Slowly return to the starting position. Repeat __________ times. Complete this exercise __________ times a day. This information is not intended to replace advice given to you by your health care provider. Make sure you discuss any questions you have with your health care provider. Document Released: 10/04/2005 Document Revised: 06/10/2016 Document Reviewed: 09/12/2015 Elsevier Interactive Patient Education  2018 [employer].     IF you received an x-ray today, you will receive an invoice from [facility]. Please contact [facility] at [phone] with questions or concerns regarding your invoice.   IF you received labwork today, you will receive an invoice from [facility]. Please contact [facility] at [phone] with questions or concerns regarding your invoice.   Our billing staff will not be able to assist you with questions regarding bills from these companies.  You will be contacted with the lab results as soon as they are available. The fastest way to get your results is to activate your My Chart account. Instructions are located on the last page of this paperwork. If you have not heard from [place] regarding the results in 2 weeks, please contact this office.

## 2017-11-04 ENCOUNTER — Other Ambulatory Visit: Payer: Self-pay | Admitting: Cardiothoracic Surgery

## 2017-11-04 DIAGNOSIS — I712 Thoracic aortic aneurysm, without rupture, unspecified: Secondary | ICD-10-CM

## 2017-11-09 ENCOUNTER — Ambulatory Visit: Payer: BC Managed Care – PPO | Admitting: Physician Assistant

## 2017-11-10 ENCOUNTER — Ambulatory Visit (INDEPENDENT_AMBULATORY_CARE_PROVIDER_SITE_OTHER): Payer: BC Managed Care – PPO | Admitting: Physician Assistant

## 2017-11-10 ENCOUNTER — Encounter: Payer: Self-pay | Admitting: Physician Assistant

## 2017-11-10 VITALS — BP 128/84 | HR 95 | Temp 98.2°F | Resp 16 | Ht 62.0 in | Wt 179.0 lb

## 2017-11-10 DIAGNOSIS — M25512 Pain in left shoulder: Secondary | ICD-10-CM | POA: Diagnosis not present

## 2017-11-10 DIAGNOSIS — M7522 Bicipital tendinitis, left shoulder: Secondary | ICD-10-CM | POA: Diagnosis not present

## 2017-11-10 NOTE — Progress Notes (Signed)
PRIMARY CARE AT [facility] [address] [place] [address] [phone]  Date:  11/10/2017   Name:  Megan Lang   DOB:  June 09, 1971   MRN:  [id number]  PCP:  Shawnee Knapp, MD    History of Present Illness:  Megan Lang is a 47 y.o. female patient who presents to PCP with  Chief Complaint  Patient presents with  . Shoulder Pain    re check/ pt states meds help with pain but still not feeling completely better.     Shoulder pain continues at her left shoulder and radiates to her elbow, as it did 2 weeks ago at her first visit.  She notes that she has iced, medications improve her symptoms.  She continues to work, and will feel a sharp pain.  Notes considerable improvement of her left shoulder.    ortmann Copy for carpal tunnel history  Patient Active Problem List   Diagnosis Date Noted  . Scoliosis (and kyphoscoliosis), idiopathic 07/01/2017  . Chronic bilateral back pain 07/01/2017  . Nexplanon in place 07/29/2016  . Cardiomegaly 07/01/2016  . History of aortic aneurysm 07/01/2016  . GERD (gastroesophageal reflux disease) 01/23/2016  . Dyspnea 10/27/2015  . Costochondritis 08/26/2015  . Obesity 07/01/2015  . Upper airway cough syndrome 06/30/2015  . History of shingles 01/26/2015  . Family history of colon cancer 10/27/2011  . Essential hypertension 10/27/2011    Past Medical History:  Diagnosis Date  . Acid reflux   . Allergy   . Aortic aneurysm ([place])   . Asthma   . Cardiomegaly   . Carpal tunnel syndrome   . Gastric polyp   . GERD (gastroesophageal reflux disease) 01/23/2016  . Hiatal hernia   . Hypertension   . Normal spontaneous vaginal delivery    3    Past Surgical History:  Procedure Laterality Date  . CARPAL TUNNEL RELEASE  2009  . CARPAL TUNNEL RELEASE Left   . COLONOSCOPY    . NASAL SINUS SURGERY     DEC 15,2016    Social History   Tobacco Use  . Smoking status: Never Smoker  . Smokeless tobacco: Never Used  Substance Use  Topics  . Alcohol use: No    Alcohol/week: 0.0 oz  . Drug use: No    Family History  Problem Relation Age of Onset  . Hypertension Mother   . Kidney disease Father   . Liver disease Father        Unknown diagnosis  . Breast cancer Maternal Grandmother   . Hypertension Sister   . Hypertension Brother     Allergies  Allergen Reactions  . Clindamycin Diarrhea and Other (See Comments)    GI upset  . Codeine Other (See Comments)    TACHYCARDIA   . Levofloxacin Other (See Comments)    other  . Metronidazole Other (See Comments)    Other  . Minocin [Minocycline Hcl] Other (See Comments)    Stomach Pains  . Moxifloxacin Swelling    other  . Penicillins Rash    Has patient had a PCN reaction causing immediate rash, facial/tongue/throat swelling, SOB or lightheadedness with hypotension:yes Has patient had a PCN reaction causing severe rash involving mucus membranes or skin necrosis:no Has patient had a PCN reaction that required hospitalization:no Has patient had a PCN reaction occurring within the last 10 years:No If all of the above answers are "NO", then may proceed with Cephalosporin    Medication list has been reviewed and updated.  Current Outpatient Medications on File Prior to Visit  Medication Sig Dispense Refill  . acetaminophen (TYLENOL) 500 MG tablet Take 500 mg by mouth every 4 (four) hours as needed for moderate pain.     Marland Kitchen albuterol (PROVENTIL HFA;VENTOLIN HFA) 108 (90 Base) MCG/ACT inhaler Inhale 1-2 puffs into the lungs every 6 (six) hours as needed for wheezing or shortness of breath. 1 Inhaler 0  . amLODipine (NORVASC) 5 MG tablet Take 1 tablet (5 mg total) by mouth daily. 90 tablet 1  . bisoprolol-hydrochlorothiazide (ZIAC) 5-6.25 MG tablet Take 1 tablet by mouth daily. 90 tablet 1  . bisoprolol-hydrochlorothiazide (ZIAC) 5-6.25 MG tablet TAKE 1 TABLET BY MOUTH ONCE DAILY 90 tablet 0  . cyclobenzaprine (FLEXERIL) 10 MG tablet Take 0.5-1 tablets (5-10 mg  total) by mouth 3 (three) times daily as needed. 30 tablet 0  . dicyclomine (BENTYL) 10 MG capsule Take 1 capsule (10 mg total) by mouth as directed. May take 1-2 caps every 8 hours as needed for abdominal cramping 30 capsule 3  . etonogestrel (NEXPLANON) 68 MG IMPL implant 1 each by Subdermal route once.    . meloxicam (MOBIC) 15 MG tablet Take 1 tablet (15 mg total) by mouth daily. 30 tablet 0  . Multiple Vitamin (THERA) TABS Take 1 tablet by mouth daily.     . ondansetron (ZOFRAN ODT) 8 MG disintegrating tablet Take 1 tablet (8 mg total) by mouth every 8 (eight) hours as needed for nausea or vomiting. 10 tablet 0  . ranitidine (ZANTAC) 150 MG capsule Take 150 mg by mouth daily.    . sodium chloride (OCEAN) 0.65 % SOLN nasal spray Place 1 spray into both nostrils 2 (two) times daily.     No current facility-administered medications on file prior to visit.     ROS ROS otherwise unremarkable unless listed above.  Physical Examination: BP 128/84   Pulse 95   Temp 98.2 F (36.8 C) (Oral)   Resp 16   Ht 5\' 2"  (1.575 m)   Wt 179 lb (81.2 kg)   SpO2 97%   BMI 32.74 kg/m  Ideal Body Weight: Weight in (lb) to have BMI = 25: 136.4  Physical Exam  Constitutional: She is oriented to person, place, and time. She appears well-developed and well-nourished. No distress.  HENT:  Head: Normocephalic and atraumatic.  Right Ear: External ear normal.  Left Ear: External ear normal.  Eyes: Conjunctivae and EOM are normal. Pupils are equal, round, and reactive to light.  Cardiovascular: Normal rate.  Pulmonary/Chest: Effort normal. No respiratory distress.  Musculoskeletal:       Left shoulder: She exhibits tenderness (biceps tendon groove), bony tenderness (ac joint) and pain (pain with external rotation). She exhibits normal range of motion and no swelling.  Neurological: She is alert and oriented to person, place, and time.  Skin: She is not diaphoretic.  Psychiatric: She has a normal mood and  affect. Her behavior is normal.     Assessment and Plan: Megan Lang is a 47 y.o. female who is here today for follow up of shoulder pain.   This is improved.  Advised to continue stretches and ice regimen. Given restriction letter for employer at this time.   Return to clinic in 2 weeks.  May consider ortho visit vs physical therapy at that time.   Biceps tendinitis of left shoulder  Acute pain of left shoulder  Ivar Drape, PA-C [facility] 1/24/201910:37 AM

## 2017-11-10 NOTE — Patient Instructions (Addendum)
Continue the stretches at this medication, and icing.  I would like you to restrict your self from the use of this left arm.  Let's revisit in 2 weeks to see how this is doing.     IF you received an x-ray today, you will receive an invoice from [facility]. Please contact [facility] at [phone] with questions or concerns regarding your invoice.   IF you received labwork today, you will receive an invoice from [facility]. Please contact [facility] at [phone] with questions or concerns regarding your invoice.   Our billing staff will not be able to assist you with questions regarding bills from these companies.  You will be contacted with the lab results as soon as they are available. The fastest way to get your results is to activate your My Chart account. Instructions are located on the last page of this paperwork. If you have not heard from [place] regarding the results in 2 weeks, please contact this office.

## 2017-11-16 ENCOUNTER — Other Ambulatory Visit: Payer: Self-pay

## 2017-11-16 ENCOUNTER — Ambulatory Visit: Payer: BC Managed Care – PPO | Admitting: Physician Assistant

## 2017-11-16 ENCOUNTER — Encounter: Payer: Self-pay | Admitting: Physician Assistant

## 2017-11-16 VITALS — BP 130/80 | HR 90 | Temp 99.2°F | Resp 18 | Ht 62.0 in | Wt 181.0 lb

## 2017-11-16 DIAGNOSIS — J069 Acute upper respiratory infection, unspecified: Secondary | ICD-10-CM | POA: Diagnosis not present

## 2017-11-16 DIAGNOSIS — B9789 Other viral agents as the cause of diseases classified elsewhere: Secondary | ICD-10-CM | POA: Diagnosis not present

## 2017-11-16 MED ORDER — BENZONATATE 100 MG PO CAPS: 100.0000 mg | ORAL_CAPSULE | Freq: Three times a day (TID) | ORAL | 0 refills | Status: DC | PRN

## 2017-11-16 MED ORDER — GUAIFENESIN ER 1200 MG PO TB12: 1.0000 | ORAL_TABLET | Freq: Two times a day (BID) | ORAL | 1 refills | Status: DC | PRN

## 2017-11-16 MED ORDER — AZELASTINE HCL 0.1 % NA SOLN: 2.0000 | Freq: Two times a day (BID) | NASAL | 0 refills | Status: AC

## 2017-11-16 NOTE — Progress Notes (Signed)
Patient ID: DANEKA LANTIGUA, female    DOB: Mar 10, 1971, 47 y.o.   MRN: [id number]  PCP: Shawnee Knapp, MD  Chief Complaint  Patient presents with  . Cough    x4 days, Pt states she is experiencing cough, runny and stuffy nose. Pt states when she coughs a lot her chest hurts. Pt states she is coughing up green mucous. Pt states she has been taking OTC Coricidin HBP.    Subjective:   Presents for evaluation of cough x 4 days.  Associated symptoms include scratchy throat, sneezing, runny nose, headache, tired. These are preventing good sleep.  Cough is productive of yellow-green sputum, and this morning there was some blood tinge. SOB and chest soreness with coughing episodes. Feels that her symptoms are improving, and she feels best during the day.  No fever, chills. No GI/GU symptoms. No body aches, sinus pressure. No wheezing.  Flu vaccine 2 weeks ago. Multiple sick contacts at work.  Saline nasal spray has helped. She was using Coricidin HBP and chlorpheniramine until she ran out.  Previous sinus surgery 10/02/2015.    Review of Systems As above.    Patient Active Problem List   Diagnosis Date Noted  . Scoliosis (and kyphoscoliosis), idiopathic 07/01/2017  . Chronic bilateral back pain 07/01/2017  . Nexplanon in place 07/29/2016  . Cardiomegaly 07/01/2016  . History of aortic aneurysm 07/01/2016  . GERD (gastroesophageal reflux disease) 01/23/2016  . Dyspnea 10/27/2015  . Costochondritis 08/26/2015  . Obesity 07/01/2015  . Upper airway cough syndrome 06/30/2015  . History of shingles 01/26/2015  . Family history of colon cancer 10/27/2011  . Essential hypertension 10/27/2011     Prior to Admission medications   Medication Sig Start Date End Date Taking? Authorizing Provider  acetaminophen (TYLENOL) 500 MG tablet Take 500 mg by mouth every 4 (four) hours as needed for moderate pain.    Yes [provider]  albuterol (PROVENTIL HFA;VENTOLIN  HFA) 108 (90 Base) MCG/ACT inhaler Inhale 1-2 puffs into the lungs every 6 (six) hours as needed for wheezing or shortness of breath. 09/03/16  Yes Elnora Morrison, MD  amLODipine (NORVASC) 5 MG tablet Take 1 tablet (5 mg total) by mouth daily. 06/30/17  Yes Shawnee Knapp, MD  bisoprolol-hydrochlorothiazide [facility]) 5-6.25 MG tablet Take 1 tablet by mouth daily. 06/30/17  Yes Shawnee Knapp, MD  bisoprolol-hydrochlorothiazide [facility]) 5-6.25 MG tablet TAKE 1 TABLET BY MOUTH ONCE DAILY 08/30/17  Yes Shawnee Knapp, MD  cyclobenzaprine (FLEXERIL) 10 MG tablet Take 0.5-1 tablets (5-10 mg total) by mouth 3 (three) times daily as needed. 10/24/17  Yes English, Colletta Maryland D, PA  dicyclomine (BENTYL) 10 MG capsule Take 1 capsule (10 mg total) by mouth as directed. May take 1-2 caps every 8 hours as needed for abdominal cramping 08/10/17  Yes Armbruster, Carlota Raspberry, MD  etonogestrel (NEXPLANON) 68 MG IMPL implant 1 each by Subdermal route once.   Yes [provider]  meloxicam (MOBIC) 15 MG tablet Take 1 tablet (15 mg total) by mouth daily. 10/24/17  Yes English, Colletta Maryland D, PA  Multiple Vitamin (THERA) TABS Take 1 tablet by mouth daily.    Yes [provider]  ondansetron (ZOFRAN ODT) 8 MG disintegrating tablet Take 1 tablet (8 mg total) by mouth every 8 (eight) hours as needed for nausea or vomiting. 06/20/17  Yes Jola Schmidt, MD  ranitidine (ZANTAC) 150 MG capsule Take 150 mg by mouth daily. 05/31/17  Yes [provider]  sodium chloride (OCEAN) 0.65 % SOLN nasal spray Place 1 spray into both nostrils 2 (two) times daily.   Yes [provider]     Allergies  Allergen Reactions  . Clindamycin Diarrhea and Other (See Comments)    GI upset  . Codeine Other (See Comments)    TACHYCARDIA   . Levofloxacin Other (See Comments)    other  . Metronidazole Other (See Comments)    Other  . Minocin [Minocycline Hcl] Other (See Comments)    Stomach Pains  . Moxifloxacin Swelling    other  .  Penicillins Rash    Has patient had a PCN reaction causing immediate rash, facial/tongue/throat swelling, SOB or lightheadedness with hypotension:yes Has patient had a PCN reaction causing severe rash involving mucus membranes or skin necrosis:no Has patient had a PCN reaction that required hospitalization:no Has patient had a PCN reaction occurring within the last 10 years:No If all of the above answers are "NO", then may proceed with Cephalosporin       Objective:  Physical Exam  Constitutional: She is oriented to person, place, and time. She appears well-developed and well-nourished. No distress.  BP 130/80 (BP Location: Right Arm, Patient Position: Sitting, Cuff Size: Normal)   Pulse 90   Temp 99.2 F (37.3 C) (Oral)   Resp 18   Ht 5\' 2"  (1.575 m)   Wt 181 lb (82.1 kg)   SpO2 98%   BMI 33.11 kg/m    HENT:  Head: Normocephalic and atraumatic.  Right Ear: Hearing, tympanic membrane, external ear and ear canal normal.  Left Ear: Hearing, tympanic membrane, external ear and ear canal normal.  Nose: Mucosal edema (mild) present.  No foreign bodies. Right sinus exhibits no maxillary sinus tenderness and no frontal sinus tenderness. Left sinus exhibits no maxillary sinus tenderness and no frontal sinus tenderness.  Mouth/Throat: Uvula is midline, oropharynx is clear and moist and mucous membranes are normal. No uvula swelling. No oropharyngeal exudate.  Eyes: Conjunctivae and EOM are normal. Pupils are equal, round, and reactive to light. Right eye exhibits no discharge. Left eye exhibits no discharge. No scleral icterus.  Neck: Trachea normal, normal range of motion and full passive range of motion without pain. Neck supple. No thyroid mass and no thyromegaly present.  Cardiovascular: Normal rate, regular rhythm and normal heart sounds.  Pulmonary/Chest: Effort normal and breath sounds normal.  Lymphadenopathy:       Head (right side): No submandibular, no tonsillar, no preauricular,  no posterior auricular and no occipital adenopathy present.       Head (left side): No submandibular, no tonsillar, no preauricular and no occipital adenopathy present.    She has no cervical adenopathy.       Right: No supraclavicular adenopathy present.       Left: No supraclavicular adenopathy present.  Neurological: She is alert and oriented to person, place, and time. She has normal strength. No cranial nerve deficit or sensory deficit.  Skin: Skin is warm, dry and intact. No rash noted.  Psychiatric: She has a normal mood and affect. Her speech is normal and behavior is normal.           Assessment & Plan:   1. Viral URI with cough Supportive care.  Anticipatory guidance.  RTC if symptoms worsen/persist. - benzonatate (TESSALON) 100 MG capsule; Take 1-2 capsules (100-200 mg total) by mouth 3 (three) times daily as needed for cough.  Dispense: 40 capsule; Refill: 0 - azelastine (ASTELIN) 0.1 % nasal spray;  Place 2 sprays into both nostrils 2 (two) times daily. Use in each nostril as directed  Dispense: 30 mL; Refill: 0 - Guaifenesin (MUCINEX MAXIMUM STRENGTH) 1200 MG TB12; Take 1 tablet (1,200 mg total) by mouth every 12 (twelve) hours as needed.  Dispense: 14 tablet; Refill: 1    Return if symptoms worsen or fail to improve.   Fara Chute, PA-C [facility] [place]

## 2017-11-16 NOTE — Patient Instructions (Addendum)
Keep hydrated with lots of water - 64 ounces a day Stay well rested!    IF you received an x-ray today, you will receive an invoice from [facility]. Please contact [facility] at [phone] with questions or concerns regarding your invoice.   IF you received labwork today, you will receive an invoice from [facility]. Please contact [facility] at [phone] with questions or concerns regarding your invoice.   Our billing staff will not be able to assist you with questions regarding bills from these companies.  You will be contacted with the lab results as soon as they are available. The fastest way to get your results is to activate your My Chart account. Instructions are located on the last page of this paperwork. If you have not heard from [place] regarding the results in 2 weeks, please contact this office.     Viral Respiratory Infection A respiratory infection is an illness that affects part of the respiratory system, such as the lungs, nose, or throat. Most respiratory infections are caused by either viruses or bacteria. A respiratory infection that is caused by a virus is called a viral respiratory infection. Common types of viral respiratory infections include:  A cold.  The flu (influenza).  A respiratory syncytial virus (RSV) infection.  How do I know if I have a viral respiratory infection? Most viral respiratory infections cause:  A stuffy or runny nose.  Yellow or green nasal discharge.  A cough.  Sneezing.  Fatigue.  Achy muscles.  A sore throat.  Sweating or chills.  A fever.  A headache.  How are viral respiratory infections treated? If influenza is diagnosed early, it may be treated with an antiviral medicine that shortens the length of time a person has symptoms. Symptoms of viral respiratory infections may be treated with over-the-counter and prescription medicines, such as:  Expectorants. These make it easier to cough up  mucus.  Decongestant nasal sprays.  Health care providers do not prescribe antibiotic medicines for viral infections. This is because antibiotics are designed to kill bacteria. They have no effect on viruses. How do I know if I should stay home from work or school? To avoid exposing others to your respiratory infection, stay home if you have:  A fever.  A persistent cough.  A sore throat.  A runny nose.  Sneezing.  Muscles aches.  Headaches.  Fatigue.  Weakness.  Chills.  Sweating.  Nausea.  Follow these instructions at home:  Rest as much as possible.  Take over-the-counter and prescription medicines only as told by your health care provider.  Drink enough fluid to keep your urine clear or pale yellow. This helps prevent dehydration and helps loosen up mucus.  Gargle with a salt-water mixture 3-4 times per day or as needed. To make a salt-water mixture, completely dissolve -1 tsp of salt in 1 cup of warm water.  Use nose drops made from salt water to ease congestion and soften raw skin around your nose.  Do not drink alcohol.  Do not use tobacco products, including cigarettes, chewing tobacco, and e-cigarettes. If you need help quitting, ask your health care provider. Contact a health care provider if:  Your symptoms last for 10 days or longer.  Your symptoms get worse over time.  You have a fever.  You have severe sinus pain in your face or forehead.  The glands in your jaw or neck become very swollen. Get help right away if:  You feel pain or pressure in your  chest.  You have shortness of breath.  You faint or feel like you will faint.  You have severe and persistent vomiting.  You feel confused or disoriented. This information is not intended to replace advice given to you by your health care provider. Make sure you discuss any questions you have with your health care provider. Document Released: 07/14/2005 Document Revised: 03/11/2016  Document Reviewed: 03/12/2015 Elsevier Interactive Patient Education  [employer].

## 2017-11-16 NOTE — Progress Notes (Signed)
Subjective:    Patient ID: Megan Lang, female    DOB: [birth year], 47 y.o.   MRN: [id number]  Chief Complaint  Patient presents with  . Cough    x4 days, Pt states she is experiencing cough, runny and stuffy nose. Pt states when she coughs a lot her chest hurts. Pt states she is coughing up green mucous. Pt states she has been taking OTC Coricidin HBP.   Symptoms started 5 days ago and include: scratchy/itchy throat, sneezing, running nose, headache, fatigue, difficulty sleeping, cough x 2 days ago. Cough mainly in the morning. Productive cough with yellow/green sputum, was blood tinged this morning. Gets SOB and sore with coughing. Since 5 days ago, overall feels like she is improving. Worse in the morning and at night. Feels better during the day.   Using Saline nasal spray QD, Coricidin HBP 2 tablets during day and 2 tablets at night, and Chlorpheniramine once a day. Coricidin is helpful, but ran out 2 nights ago. Works for [employer], multiple kids have been sick around her.  Nasal sinus surgery on October 02, 2015.  Review of Systems Denies: fever, chills, body aches, sinus pressure/pain, chest pain, wheezing, nausea, vomiting, or diarrhea  Patient Active Problem List   Diagnosis Date Noted  . Scoliosis (and kyphoscoliosis), idiopathic 07/01/2017  . Chronic bilateral back pain 07/01/2017  . Nexplanon in place 07/29/2016  . Cardiomegaly 07/01/2016  . History of aortic aneurysm 07/01/2016  . GERD (gastroesophageal reflux disease) 01/23/2016  . Dyspnea 10/27/2015  . Costochondritis 08/26/2015  . Obesity 07/01/2015  . Upper airway cough syndrome 06/30/2015  . History of shingles 01/26/2015  . Family history of colon cancer 10/27/2011  . Essential hypertension 10/27/2011   Prior to Admission medications   Medication Sig Start Date End Date Taking? Authorizing Provider  acetaminophen (TYLENOL) 500 MG tablet Take 500 mg by mouth every 4 (four) hours as needed  for moderate pain.    Yes [provider]  albuterol (PROVENTIL HFA;VENTOLIN HFA) 108 (90 Base) MCG/ACT inhaler Inhale 1-2 puffs into the lungs every 6 (six) hours as needed for wheezing or shortness of breath. 09/03/16  Yes Elnora Morrison, MD  amLODipine (NORVASC) 5 MG tablet Take 1 tablet (5 mg total) by mouth daily. 06/30/17  Yes Shawnee Knapp, MD  bisoprolol-hydrochlorothiazide [facility]) 5-6.25 MG tablet Take 1 tablet by mouth daily. 06/30/17  Yes Shawnee Knapp, MD  bisoprolol-hydrochlorothiazide [facility]) 5-6.25 MG tablet TAKE 1 TABLET BY MOUTH ONCE DAILY 08/30/17  Yes Shawnee Knapp, MD  cyclobenzaprine (FLEXERIL) 10 MG tablet Take 0.5-1 tablets (5-10 mg total) by mouth 3 (three) times daily as needed. 10/24/17  Yes English, Colletta Maryland D, PA  dicyclomine (BENTYL) 10 MG capsule Take 1 capsule (10 mg total) by mouth as directed. May take 1-2 caps every 8 hours as needed for abdominal cramping 08/10/17  Yes Armbruster, Carlota Raspberry, MD  etonogestrel (NEXPLANON) 68 MG IMPL implant 1 each by Subdermal route once.   Yes [provider]  meloxicam (MOBIC) 15 MG tablet Take 1 tablet (15 mg total) by mouth daily. 10/24/17  Yes English, Colletta Maryland D, PA  Multiple Vitamin (THERA) TABS Take 1 tablet by mouth daily.    Yes [provider]  ondansetron (ZOFRAN ODT) 8 MG disintegrating tablet Take 1 tablet (8 mg total) by mouth every 8 (eight) hours as needed for nausea or vomiting. 06/20/17  Yes Jola Schmidt, MD  ranitidine (ZANTAC) 150 MG capsule Take 150 mg by mouth  daily. 05/31/17  Yes [provider]  sodium chloride (OCEAN) 0.65 % SOLN nasal spray Place 1 spray into both nostrils 2 (two) times daily.   Yes [provider]                        Allergies  Allergen Reactions  . Clindamycin Diarrhea and Other (See Comments)    GI upset  . Codeine Other (See Comments)    TACHYCARDIA   . Levofloxacin Other (See Comments)    other  . Metronidazole Other (See Comments)    Other    . Minocin [Minocycline Hcl] Other (See Comments)    Stomach Pains  . Moxifloxacin Swelling    other  . Penicillins Rash    Has patient had a PCN reaction causing immediate rash, facial/tongue/throat swelling, SOB or lightheadedness with hypotension:yes Has patient had a PCN reaction causing severe rash involving mucus membranes or skin necrosis:no Has patient had a PCN reaction that required hospitalization:no Has patient had a PCN reaction occurring within the last 10 years:No If all of the above answers are "NO", then may proceed with Cephalosporin      Objective:   Physical Exam  Constitutional: She is oriented to person, place, and time. She appears well-developed and well-nourished.  HENT:  Head: Normocephalic.  Right Ear: Hearing, tympanic membrane, external ear and ear canal normal.  Left Ear: Hearing, tympanic membrane, external ear and ear canal normal.  Nose: Mucosal edema (mild) and rhinorrhea present. Right sinus exhibits no maxillary sinus tenderness and no frontal sinus tenderness. Left sinus exhibits no maxillary sinus tenderness and no frontal sinus tenderness.  Mouth/Throat: Uvula is midline, oropharynx is clear and moist and mucous membranes are normal. No oropharyngeal exudate, posterior oropharyngeal edema, posterior oropharyngeal erythema or tonsillar abscesses.  Eyes: Conjunctivae and lids are normal.  Cardiovascular: Normal rate, normal heart sounds and intact distal pulses. Exam reveals no gallop and no friction rub.  No murmur heard. Pulmonary/Chest: Effort normal and breath sounds normal. No respiratory distress. She has no wheezes. She has no rales. She exhibits no tenderness.  Lymphadenopathy:       Head (right side): No submental, no submandibular, no tonsillar, no preauricular, no posterior auricular and no occipital adenopathy present.       Head (left side): No submental, no submandibular, no tonsillar, no preauricular, no posterior auricular and no  occipital adenopathy present.    She has no cervical adenopathy.  Neurological: She is alert and oriented to person, place, and time.  Psychiatric: She has a normal mood and affect. Her behavior is normal.   Blood pressure 130/80, pulse 90, temperature 99.2 F (37.3 C), temperature source Oral, resp. rate 18, height 5\' 2"  (1.575 m), weight 181 lb (82.1 kg), SpO2 98 %.     Assessment & Plan:  1. Viral URI with cough Signs and symptoms consistent with viral URI. Start Tessalon, Astelin, and Mucinex. Can continue Coricidin HBP. Instructed to stay hydrated and well rested.   - benzonatate (TESSALON) 100 MG capsule; Take 1-2 capsules (100-200 mg total) by mouth 3 (three) times daily as needed for cough.  Dispense: 40 capsule; Refill: 0 - azelastine (ASTELIN) 0.1 % nasal spray; Place 2 sprays into both nostrils 2 (two) times daily. Use in each nostril as directed  Dispense: 30 mL; Refill: 0 - Guaifenesin (MUCINEX MAXIMUM STRENGTH) 1200 MG TB12; Take 1 tablet (1,200 mg total) by mouth every 12 (twelve) hours as needed.  Dispense: 14  tablet; Refill: 1  Return if symptoms worsen or fail to improve.  Noemi Chapel, PA-S

## 2017-11-24 ENCOUNTER — Ambulatory Visit: Payer: BC Managed Care – PPO | Admitting: Physician Assistant

## 2017-11-24 ENCOUNTER — Encounter: Payer: Self-pay | Admitting: Physician Assistant

## 2017-11-24 VITALS — BP 138/82 | HR 83 | Temp 98.3°F | Resp 14 | Ht 62.0 in | Wt 180.0 lb

## 2017-11-24 DIAGNOSIS — M25512 Pain in left shoulder: Secondary | ICD-10-CM

## 2017-11-24 DIAGNOSIS — M7522 Bicipital tendinitis, left shoulder: Secondary | ICD-10-CM | POA: Diagnosis not present

## 2017-11-24 NOTE — Progress Notes (Signed)
PRIMARY CARE AT [facility] [address], [place] [address] [phone]  Date:  11/24/2017   Name:  ISOLA MEHLMAN   DOB:  11/05/1970   MRN:  [id number]  PCP:  Shawnee Knapp, MD    History of Present Illness:  ARLENY KRUGER is a 47 y.o. female patient who presents to PCP with  Chief Complaint  Patient presents with  . Shoulder Pain    left, follow up     Patient is following up one month after having this shoulder pain.  It still is more at the front of her shoulder and radiates down.  She has noticed improvement of her shoulder pain, and has no swelling at the shoulder.  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  No numbness or tingling.   She is exercising and dong the stretch and exercises.  She is icing.  She is concerned that she needs to return to work.  At this time she is on sick leave.  Patient Active Problem List   Diagnosis Date Noted  . Scoliosis (and kyphoscoliosis), idiopathic 07/01/2017  . Chronic bilateral back pain 07/01/2017  . Nexplanon in place 07/29/2016  . Cardiomegaly 07/01/2016  . History of aortic aneurysm 07/01/2016  . GERD (gastroesophageal reflux disease) 01/23/2016  . Dyspnea 10/27/2015  . Costochondritis 08/26/2015  . Obesity 07/01/2015  . Upper airway cough syndrome 06/30/2015  . History of shingles 01/26/2015  . Family history of colon cancer 10/27/2011  . Essential hypertension 10/27/2011    Past Medical History:  Diagnosis Date  . Acid reflux   . Allergy   . Aortic aneurysm ([place])   . Asthma   . Cardiomegaly   . Carpal tunnel syndrome   . Gastric polyp   . GERD (gastroesophageal reflux disease) 01/23/2016  . Hiatal hernia   . Hypertension   . Normal spontaneous vaginal delivery    3    Past Surgical History:  Procedure Laterality Date  . CARPAL TUNNEL RELEASE  2009  . CARPAL TUNNEL RELEASE Left   . COLONOSCOPY     . NASAL SINUS SURGERY     DEC 15,2016    Social History   Tobacco Use  . Smoking status: Never Smoker  . Smokeless tobacco: Never Used  Substance Use Topics  . Alcohol use: No    Alcohol/week: 0.0 oz  . Drug use: No    Family History  Problem Relation Age of Onset  . Hypertension Mother   . Kidney disease Father   . Liver disease Father        Unknown diagnosis  . Breast cancer Maternal Grandmother   . Hypertension Sister   . Hypertension Brother     Allergies  Allergen Reactions  . Clindamycin Diarrhea and Other (See Comments)    GI upset  . Codeine Other (See Comments)    TACHYCARDIA   . Levofloxacin Other (See Comments)    other  . Metronidazole Other (See Comments)    Other  . Minocin [Minocycline Hcl] Other (See Comments)    Stomach Pains  . Moxifloxacin Swelling    other  . Penicillins Rash    Has patient had a PCN reaction causing immediate rash, facial/tongue/throat swelling, SOB or lightheadedness with hypotension:yes Has patient had a PCN reaction causing severe rash involving mucus membranes or skin necrosis:no Has patient had a PCN reaction that required hospitalization:no Has patient had  a PCN reaction occurring within the last 10 years:No If all of the above answers are "NO", then may proceed with Cephalosporin    Medication list has been reviewed and updated.  Current Outpatient Medications on File Prior to Visit  Medication Sig Dispense Refill  . acetaminophen (TYLENOL) 500 MG tablet Take 500 mg by mouth every 4 (four) hours as needed for moderate pain.     Marland Kitchen albuterol (PROVENTIL HFA;VENTOLIN HFA) 108 (90 Base) MCG/ACT inhaler Inhale 1-2 puffs into the lungs every 6 (six) hours as needed for wheezing or shortness of breath. 1 Inhaler 0  . amLODipine (NORVASC) 5 MG tablet Take 1 tablet (5 mg total) by mouth daily. 90 tablet 1  . azelastine (ASTELIN) 0.1 % nasal spray Place 2 sprays into both nostrils 2 (two) times daily. Use in each nostril as  directed 30 mL 0  . benzonatate (TESSALON) 100 MG capsule Take 1-2 capsules (100-200 mg total) by mouth 3 (three) times daily as needed for cough. 40 capsule 0  . bisoprolol-hydrochlorothiazide (ZIAC) 5-6.25 MG tablet Take 1 tablet by mouth daily. 90 tablet 1  . bisoprolol-hydrochlorothiazide (ZIAC) 5-6.25 MG tablet TAKE 1 TABLET BY MOUTH ONCE DAILY 90 tablet 0  . cyclobenzaprine (FLEXERIL) 10 MG tablet Take 0.5-1 tablets (5-10 mg total) by mouth 3 (three) times daily as needed. 30 tablet 0  . dicyclomine (BENTYL) 10 MG capsule Take 1 capsule (10 mg total) by mouth as directed. May take 1-2 caps every 8 hours as needed for abdominal cramping 30 capsule 3  . etonogestrel (NEXPLANON) 68 MG IMPL implant 1 each by Subdermal route once.    . Guaifenesin (MUCINEX MAXIMUM STRENGTH) 1200 MG TB12 Take 1 tablet (1,200 mg total) by mouth every 12 (twelve) hours as needed. 14 tablet 1  . meloxicam (MOBIC) 15 MG tablet Take 1 tablet (15 mg total) by mouth daily. 30 tablet 0  . Multiple Vitamin (THERA) TABS Take 1 tablet by mouth daily.     . ranitidine (ZANTAC) 150 MG capsule Take 150 mg by mouth daily.    . sodium chloride (OCEAN) 0.65 % SOLN nasal spray Place 1 spray into both nostrils 2 (two) times daily.    . ondansetron (ZOFRAN ODT) 8 MG disintegrating tablet Take 1 tablet (8 mg total) by mouth every 8 (eight) hours as needed for nausea or vomiting. (Patient not taking: Reported on 11/24/2017) 10 tablet 0   No current facility-administered medications on file prior to visit.     ROS ROS otherwise unremarkable unless listed above.  Physical Examination: BP 138/82   Pulse 83   Temp 98.3 F (36.8 C) (Oral)   Resp 14   Ht 5\' 2"  (1.575 m)   Wt 180 lb (81.6 kg)   SpO2 96%   BMI 32.92 kg/m  Ideal Body Weight: Weight in (lb) to have BMI = 25: 136.4  Physical Exam  Constitutional: She is oriented to person, place, and time. She appears well-developed and well-nourished. No distress.  HENT:  Head:  Normocephalic and atraumatic.  Right Ear: External ear normal.  Left Ear: External ear normal.  Eyes: Conjunctivae and EOM are normal. Pupils are equal, round, and reactive to light.  Cardiovascular: Normal rate.  Pulmonary/Chest: Effort normal. No respiratory distress.  Musculoskeletal:  Right extremity:  Proximal bicipital groove tenderness rom difficulty with resisted flexion.   +yergason. Internal rotation painful.   Decent rom.  Neurological: She is alert and oriented to person, place, and time.  Skin: She is not  diaphoretic.  Psychiatric: She has a normal mood and affect. Her behavior is normal.     Assessment and Plan: CASSY SPROWL is a 47 y.o. female who is here today for cc of  Chief Complaint  Patient presents with  . Shoulder Pain    left, follow up   Biceps tendinitis of left upper extremity - Plan: AMB referral to orthopedics  Left shoulder pain, unspecified chronicity - Plan: AMB referral to orthopedics  Ivar Drape, PA-C [facility] 2/7/20191:29 PM

## 2017-11-24 NOTE — Patient Instructions (Addendum)
   I am referring you to an orthopedist.  You will follow up with me in 2 weeks, if you do not have an appointment yet with the specialist. Continue to ice, soft stretches, and conditioning as discussed.   IF you received an x-ray today, you will receive an invoice from [facility]. Please contact [facility] at [phone] with questions or concerns regarding your invoice.   IF you received labwork today, you will receive an invoice from [facility]. Please contact [facility] at [phone] with questions or concerns regarding your invoice.   Our billing staff will not be able to assist you with questions regarding bills from these companies.  You will be contacted with the lab results as soon as they are available. The fastest way to get your results is to activate your My Chart account. Instructions are located on the last page of this paperwork. If you have not heard from [place] regarding the results in 2 weeks, please contact this office.

## 2017-11-25 ENCOUNTER — Other Ambulatory Visit: Payer: Self-pay | Admitting: Obstetrics & Gynecology

## 2017-11-25 DIAGNOSIS — Z139 Encounter for screening, unspecified: Secondary | ICD-10-CM

## 2017-11-30 ENCOUNTER — Encounter: Payer: Self-pay | Admitting: Obstetrics & Gynecology

## 2017-11-30 ENCOUNTER — Ambulatory Visit (INDEPENDENT_AMBULATORY_CARE_PROVIDER_SITE_OTHER): Payer: BC Managed Care – PPO | Admitting: Obstetrics & Gynecology

## 2017-11-30 VITALS — BP 140/86 | Ht 62.0 in | Wt 182.0 lb

## 2017-11-30 DIAGNOSIS — Z01419 Encounter for gynecological examination (general) (routine) without abnormal findings: Secondary | ICD-10-CM

## 2017-11-30 DIAGNOSIS — Z3046 Encounter for surveillance of implantable subdermal contraceptive: Secondary | ICD-10-CM | POA: Diagnosis not present

## 2017-11-30 DIAGNOSIS — Z1151 Encounter for screening for human papillomavirus (HPV): Secondary | ICD-10-CM

## 2017-11-30 NOTE — Progress Notes (Signed)
Megan Lang 01-18-1971 [id number]   History:    47 y.o. G3P3L3 Married.  3 girls 22-25-27 yo  RP:  Established patient presenting for annual gyn exam   HPI: Well on Nexplanon x 07/2016.  Had more BTB in December 2018, but none in January and mild spotting currently.  No pelvic pain.  No pain with IC.  Breasts wnl.  Screening Mammo scheduled 12/30/2017.  Urine/BMs wnl.  Had an episode of Diverticulitis in 10/2017 treated with ABTx, followed by Gastro-Entero.  Has a small Aortic Aneurysm measured every year. Fasting health labs here today.  Regular physical activity.  Past medical history,surgical history, family history and social history were all reviewed and documented in the EPIC chart.  Gynecologic History No LMP recorded. Patient has had an implant. Contraception: Nexplanon Last Pap: 11/2014 Results were: Negative Last mammogram: 12/2016. Results were: Negative Bone Density: Never Colonoscopy: 2017.  Benign polyps/5 yr schedule.  Obstetric History OB History  Gravida Para Term Preterm AB Living  '3 3 3     3  '$ SAB TAB Ectopic Multiple Live Births          3    # Outcome Date GA Lbr Len/2nd Weight Sex Delivery Anes PTL Lv  3 Term     F Vag-Spont  N LIV  2 Term     F Vag-Spont  N LIV  1 Term     F Vag-Spont  N LIV       ROS: A ROS was performed and pertinent positives and negatives are included in the history.  GENERAL: No fevers or chills. HEENT: No change in vision, no earache, sore throat or sinus congestion. NECK: No pain or stiffness. CARDIOVASCULAR: No chest pain or pressure. No palpitations. PULMONARY: No shortness of breath, cough or wheeze. GASTROINTESTINAL: No abdominal pain, nausea, vomiting or diarrhea, melena or bright red blood per rectum. GENITOURINARY: No urinary frequency, urgency, hesitancy or dysuria. MUSCULOSKELETAL: No joint or muscle pain, no back pain, no recent trauma. DERMATOLOGIC: No rash, no itching, no lesions. ENDOCRINE: No polyuria, polydipsia, no  heat or cold intolerance. No recent change in weight. HEMATOLOGICAL: No anemia or easy bruising or bleeding. NEUROLOGIC: No headache, seizures, numbness, tingling or weakness. PSYCHIATRIC: No depression, no loss of interest in normal activity or change in sleep pattern.     Exam:   BP 140/86   Ht '5\' 2"'$  (1.575 m)   Wt 182 lb (82.6 kg)   BMI 33.29 kg/m   Body mass index is 33.29 kg/m.  General appearance : Well developed well nourished female. No acute distress HEENT: Eyes: no retinal hemorrhage or exudates,  Neck supple, trachea midline, no carotid bruits, no thyroidmegaly Lungs: Clear to auscultation, no rhonchi or wheezes, or rib retractions  Heart: Regular rate and rhythm, no murmurs or gallops Breast:Examined in sitting and supine position were symmetrical in appearance, no palpable masses or tenderness,  no skin retraction, no nipple inversion, no nipple discharge, no skin discoloration, no axillary or supraclavicular lymphadenopathy Abdomen: no palpable masses or tenderness, no rebound or guarding Extremities: no edema or skin discoloration or tenderness  Pelvic: Vulva: Normal             Vagina: No gross lesions or discharge  Cervix: No gross lesions or discharge.  Pap/HR HPV done.  Uterus  AV, normal size, shape and consistency, non-tender and mobile  Adnexa  Without masses or tenderness  Anus: Normal   Assessment/Plan:  47 y.o. female for annual exam  1. Encounter for routine gynecological examination with Papanicolaou smear of cervix Normal gynecologic exam.  Pap with high-risk HPV done today.  Breast exam normal.  Screening mammogram scheduled in March 2019.  Health labs here today.  Continue with regular physical activity. - CBC - TSH - Vitamin D 1,25 dihydroxy - Comp Met (CMET) - Lipid panel  2. Encounter for surveillance of implantable subdermal contraceptive Well on Nexplanon.  Will be due to change in October 2020.  Princess Bruins MD, 8:56 AM  11/30/2017

## 2017-12-03 LAB — VITAMIN D 1,25 DIHYDROXY
VITAMIN D 1, 25 (OH) TOTAL: 48 pg/mL (ref 18–72)
Vitamin D2 1, 25 (OH)2: <8 pg/mL
Vitamin D3 1, 25 (OH)2: 48 pg/mL

## 2017-12-03 LAB — CBC
HEMATOCRIT: 41.3 % (ref 35.0–45.0)
Hemoglobin: 13.7 g/dL (ref 11.7–15.5)
MCH: 29.5 pg (ref 27.0–33.0)
MCHC: 33.2 g/dL (ref 32.0–36.0)
MCV: 89 fL (ref 80.0–100.0)
MPV: 10.1 fL (ref 7.5–12.5)
PLATELETS: 295 10*3/uL (ref 140–400)
RBC: 4.64 10*6/uL (ref 3.80–5.10)
RDW: 12.5 % (ref 11.0–15.0)
WBC: 4.3 10*3/uL (ref 3.8–10.8)

## 2017-12-03 LAB — LIPID PANEL
Cholesterol: 156 mg/dL (ref <200)
HDL: 56 mg/dL (ref >50)
LDL CHOLESTEROL (CALC): 88 mg/dL
NON-HDL CHOLESTEROL (CALC): 100 mg/dL (ref <130)
TRIGLYCERIDES: 40 mg/dL (ref <150)
Total CHOL/HDL Ratio: 2.8 (calc) (ref <5.0)

## 2017-12-03 LAB — COMPREHENSIVE METABOLIC PANEL
AG Ratio: 1.3 (calc) (ref 1.0–2.5)
ALT: 20 U/L (ref 6–29)
AST: 21 U/L (ref 10–35)
Albumin: 4.4 g/dL (ref 3.6–5.1)
Alkaline phosphatase (APISO): 70 U/L (ref 33–115)
BUN: 12 mg/dL (ref 7–25)
CO2: 23 mmol/L (ref 20–32)
CREATININE: 0.76 mg/dL (ref 0.50–1.10)
Calcium: 9.2 mg/dL (ref 8.6–10.2)
Chloride: 105 mmol/L (ref 98–110)
GLUCOSE: 80 mg/dL (ref 65–99)
Globulin: 3.4 g/dL (calc) (ref 1.9–3.7)
Potassium: 3.4 mmol/L — ABNORMAL LOW (ref 3.5–5.3)
SODIUM: 137 mmol/L (ref 135–146)
TOTAL PROTEIN: 7.8 g/dL (ref 6.1–8.1)
Total Bilirubin: 0.8 mg/dL (ref 0.2–1.2)

## 2017-12-03 LAB — TSH: TSH: 0.79 m[IU]/L

## 2017-12-04 ENCOUNTER — Encounter: Payer: Self-pay | Admitting: Obstetrics & Gynecology

## 2017-12-04 NOTE — Patient Instructions (Addendum)
1. Encounter for routine gynecological examination with Papanicolaou smear of cervix Normal gynecologic exam.  Pap with high-risk HPV done today.  Breast exam normal.  Screening mammogram scheduled in March 2019.  Health labs here today.  Continue with regular physical activity. - CBC - TSH - Vitamin D 1,25 dihydroxy - Comp Met (CMET) - Lipid panel  2. Encounter for surveillance of implantable subdermal contraceptive Well on Nexplanon.  Will be due to change in October 2020.  Megan Lang, it was a pleasure meeting you today!  I will inform you of your results as soon as they are available.  Health Maintenance, Female Adopting a healthy lifestyle and getting preventive care can go a long way to promote health and wellness. Talk with your health care provider about what schedule of regular examinations is right for you. This is a good chance for you to check in with your provider about disease prevention and staying healthy. In between checkups, there are plenty of things you can do on your own. Experts have done a lot of research about which lifestyle changes and preventive measures are most likely to keep you healthy. Ask your health care provider for more information. Weight and diet Eat a healthy diet  Be sure to include plenty of vegetables, fruits, low-fat dairy products, and lean protein.  Do not eat a lot of foods high in solid fats, added sugars, or salt.  Get regular exercise. This is one of the most important things you can do for your health. ? Most adults should exercise for at least 150 minutes each week. The exercise should increase your heart rate and make you sweat (moderate-intensity exercise). ? Most adults should also do strengthening exercises at least twice a week. This is in addition to the moderate-intensity exercise.  Maintain a healthy weight  Body mass index (BMI) is a measurement that can be used to identify possible weight problems. It estimates body fat based on  height and weight. Your health care provider can help determine your BMI and help you achieve or maintain a healthy weight.  For females 47 years of age and older: ? A BMI below 18.5 is considered underweight. ? A BMI of 18.5 to 24.9 is normal. ? A BMI of 25 to 29.9 is considered overweight. ? A BMI of 30 and above is considered obese.  Watch levels of cholesterol and blood lipids  You should start having your blood tested for lipids and cholesterol at 47 years of age, then have this test every 5 years.  You may need to have your cholesterol levels checked more often if: ? Your lipid or cholesterol levels are high. ? You are older than 47 years of age. ? You are at high risk for heart disease.  Cancer screening Lung Cancer  Lung cancer screening is recommended for adults 18-9 years old who are at high risk for lung cancer because of a history of smoking.  A yearly low-dose CT scan of the lungs is recommended for people who: ? Currently smoke. ? Have quit within the past 15 years. ? Have at least a 30-pack-year history of smoking. A pack year is smoking an average of one pack of cigarettes a day for 1 year.  Yearly screening should continue until it has been 15 years since you quit.  Yearly screening should stop if you develop a health problem that would prevent you from having lung cancer treatment.  Breast Cancer  Practice breast self-awareness. This means understanding how your breasts  normally appear and feel.  It also means doing regular breast self-exams. Let your health care provider know about any changes, no matter how small.  If you are in your 20s or 30s, you should have a clinical breast exam (CBE) by a health care provider every 1-3 years as part of a regular health exam.  If you are 29 or older, have a CBE every year. Also consider having a breast X-ray (mammogram) every year.  If you have a family history of breast cancer, talk to your health care provider  about genetic screening.  If you are at high risk for breast cancer, talk to your health care provider about having an MRI and a mammogram every year.  Breast cancer gene (BRCA) assessment is recommended for women who have family members with BRCA-related cancers. BRCA-related cancers include: ? Breast. ? Ovarian. ? Tubal. ? Peritoneal cancers.  Results of the assessment will determine the need for genetic counseling and BRCA1 and BRCA2 testing.  Cervical Cancer Your health care provider may recommend that you be screened regularly for cancer of the pelvic organs (ovaries, uterus, and vagina). This screening involves a pelvic examination, including checking for microscopic changes to the surface of your cervix (Pap test). You may be encouraged to have this screening done every 3 years, beginning at age 61.  For women ages 4-65, health care providers may recommend pelvic exams and Pap testing every 3 years, or they may recommend the Pap and pelvic exam, combined with testing for human papilloma virus (HPV), every 5 years. Some types of HPV increase your risk of cervical cancer. Testing for HPV may also be done on women of any age with unclear Pap test results.  Other health care providers may not recommend any screening for nonpregnant women who are considered low risk for pelvic cancer and who do not have symptoms. Ask your health care provider if a screening pelvic exam is right for you.  If you have had past treatment for cervical cancer or a condition that could lead to cancer, you need Pap tests and screening for cancer for at least 20 years after your treatment. If Pap tests have been discontinued, your risk factors (such as having a new sexual partner) need to be reassessed to determine if screening should resume. Some women have medical problems that increase the chance of getting cervical cancer. In these cases, your health care provider may recommend more frequent screening and Pap  tests.  Colorectal Cancer  This type of cancer can be detected and often prevented.  Routine colorectal cancer screening usually begins at 47 years of age and continues through 47 years of age.  Your health care provider may recommend screening at an earlier age if you have risk factors for colon cancer.  Your health care provider may also recommend using home test kits to check for hidden blood in the stool.  A small camera at the end of a tube can be used to examine your colon directly (sigmoidoscopy or colonoscopy). This is done to check for the earliest forms of colorectal cancer.  Routine screening usually begins at age 60.  Direct examination of the colon should be repeated every 5-10 years through 47 years of age. However, you may need to be screened more often if early forms of precancerous polyps or small growths are found.  Skin Cancer  Check your skin from head to toe regularly.  Tell your health care provider about any new moles or changes in moles,  especially if there is a change in a mole's shape or color.  Also tell your health care provider if you have a mole that is larger than the size of a pencil eraser.  Always use sunscreen. Apply sunscreen liberally and repeatedly throughout the day.  Protect yourself by wearing long sleeves, pants, a wide-brimmed hat, and sunglasses whenever you are outside.  Heart disease, diabetes, and high blood pressure  High blood pressure causes heart disease and increases the risk of stroke. High blood pressure is more likely to develop in: ? People who have blood pressure in the high end of the normal range (130-139/85-89 mm Hg). ? People who are overweight or obese. ? People who are African American.  If you are 33-48 years of age, have your blood pressure checked every 3-5 years. If you are 59 years of age or older, have your blood pressure checked every year. You should have your blood pressure measured twice-once when you are at  a hospital or clinic, and once when you are not at a hospital or clinic. Record the average of the two measurements. To check your blood pressure when you are not at a hospital or clinic, you can use: ? An automated blood pressure machine at a pharmacy. ? A home blood pressure monitor.  If you are between 34 years and 61 years old, ask your health care provider if you should take aspirin to prevent strokes.  Have regular diabetes screenings. This involves taking a blood sample to check your fasting blood sugar level. ? If you are at a normal weight and have a low risk for diabetes, have this test once every three years after 47 years of age. ? If you are overweight and have a high risk for diabetes, consider being tested at a younger age or more often. Preventing infection Hepatitis B  If you have a higher risk for hepatitis B, you should be screened for this virus. You are considered at high risk for hepatitis B if: ? You were born in a country where hepatitis B is common. Ask your health care provider which countries are considered high risk. ? Your parents were born in a high-risk country, and you have not been immunized against hepatitis B (hepatitis B vaccine). ? You have HIV or AIDS. ? You use needles to inject street drugs. ? You live with someone who has hepatitis B. ? You have had sex with someone who has hepatitis B. ? You get hemodialysis treatment. ? You take certain medicines for conditions, including cancer, organ transplantation, and autoimmune conditions.  Hepatitis C  Blood testing is recommended for: ? Everyone born from [birth year] through 1965. ? Anyone with known risk factors for hepatitis C.  Sexually transmitted infections (STIs)  You should be screened for sexually transmitted infections (STIs) including gonorrhea and chlamydia if: ? You are sexually active and are younger than 47 years of age. ? You are older than 47 years of age and your health care provider tells  you that you are at risk for this type of infection. ? Your sexual activity has changed since you were last screened and you are at an increased risk for chlamydia or gonorrhea. Ask your health care provider if you are at risk.  If you do not have HIV, but are at risk, it may be recommended that you take a prescription medicine daily to prevent HIV infection. This is called pre-exposure prophylaxis (PrEP). You are considered at risk if: ? You are sexually  active and do not regularly use condoms or know the HIV status of your partner(s). ? You take drugs by injection. ? You are sexually active with a partner who has HIV.  Talk with your health care provider about whether you are at high risk of being infected with HIV. If you choose to begin PrEP, you should first be tested for HIV. You should then be tested every 3 months for as long as you are taking PrEP. Pregnancy  If you are premenopausal and you may become pregnant, ask your health care provider about preconception counseling.  If you may become pregnant, take 400 to 800 micrograms (mcg) of folic acid every day.  If you want to prevent pregnancy, talk to your health care provider about birth control (contraception). Osteoporosis and menopause  Osteoporosis is a disease in which the bones lose minerals and strength with aging. This can result in serious bone fractures. Your risk for osteoporosis can be identified using a bone density scan.  If you are 72 years of age or older, or if you are at risk for osteoporosis and fractures, ask your health care provider if you should be screened.  Ask your health care provider whether you should take a calcium or vitamin D supplement to lower your risk for osteoporosis.  Menopause may have certain physical symptoms and risks.  Hormone replacement therapy may reduce some of these symptoms and risks. Talk to your health care provider about whether hormone replacement therapy is right for  you. Follow these instructions at home:  Schedule regular health, dental, and eye exams.  Stay current with your immunizations.  Do not use any tobacco products including cigarettes, chewing tobacco, or electronic cigarettes.  If you are pregnant, do not drink alcohol.  If you are breastfeeding, limit how much and how often you drink alcohol.  Limit alcohol intake to no more than 1 drink per day for nonpregnant women. One drink equals 12 ounces of beer, 5 ounces of wine, or 1 ounces of hard liquor.  Do not use street drugs.  Do not share needles.  Ask your health care provider for help if you need support or information about quitting drugs.  Tell your health care provider if you often feel depressed.  Tell your health care provider if you have ever been abused or do not feel safe at home. This information is not intended to replace advice given to you by your health care provider. Make sure you discuss any questions you have with your health care provider. Document Released: 04/19/2011 Document Revised: 03/11/2016 Document Reviewed: 07/08/2015 Elsevier Interactive Patient Education  [employer].

## 2017-12-05 ENCOUNTER — Other Ambulatory Visit: Payer: Self-pay | Admitting: Cardiothoracic Surgery

## 2017-12-05 ENCOUNTER — Other Ambulatory Visit: Payer: Self-pay | Admitting: Physician Assistant

## 2017-12-05 DIAGNOSIS — M25512 Pain in left shoulder: Secondary | ICD-10-CM

## 2017-12-05 LAB — PAP, TP IMAGING W/ HPV RNA, RFLX HPV TYPE 16,18/45: HPV DNA High Risk: NOT DETECTED

## 2017-12-06 NOTE — Telephone Encounter (Signed)
  Refill of Mobic  LOV  11/24/17  LRF 10/24/17  #30  0 refills  [place], [place] - [address]

## 2017-12-07 ENCOUNTER — Ambulatory Visit: Payer: BC Managed Care – PPO | Admitting: Cardiothoracic Surgery

## 2017-12-07 ENCOUNTER — Ambulatory Visit
Admission: RE | Admit: 2017-12-07 | Discharge: 2017-12-07 | Disposition: A | Discharge status: Home / Self Care | Payer: BC Managed Care – PPO | Source: Ambulatory Visit | Attending: Cardiothoracic Surgery | Admitting: Cardiothoracic Surgery

## 2017-12-07 DIAGNOSIS — I712 Thoracic aortic aneurysm, without rupture, unspecified: Secondary | ICD-10-CM

## 2017-12-07 MED ORDER — IOPAMIDOL (ISOVUE-370) INJECTION 76%
75.0000 mL | Freq: Once | INTRAVENOUS | Status: AC | PRN
  Administered 2017-12-07: 75 mL via INTRAVENOUS

## 2017-12-19 ENCOUNTER — Encounter: Payer: Self-pay | Admitting: Physician Assistant

## 2017-12-19 DIAGNOSIS — M7542 Impingement syndrome of left shoulder: Secondary | ICD-10-CM | POA: Insufficient documentation

## 2017-12-28 ENCOUNTER — Ambulatory Visit (INDEPENDENT_AMBULATORY_CARE_PROVIDER_SITE_OTHER): Payer: BC Managed Care – PPO | Admitting: Cardiothoracic Surgery

## 2017-12-28 ENCOUNTER — Encounter: Payer: Self-pay | Admitting: Cardiothoracic Surgery

## 2017-12-28 VITALS — BP 132/88 | HR 80 | Resp 20 | Ht 62.0 in | Wt 182.0 lb

## 2017-12-28 DIAGNOSIS — I712 Thoracic aortic aneurysm, without rupture, unspecified: Secondary | ICD-10-CM

## 2017-12-28 NOTE — Progress Notes (Signed)
PCP is Shawnee Knapp, MD Referring Provider is Copland, Gay Filler, MD  Chief Complaint  Patient presents with  . 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  It has been followed with surveillance scans since 2017 without change.  She denies any chest pain.  She does have hypertension and hypertension runs in the family.  She is currently taking Ziac and Norvasc to control her blood pressure.  She denies orthopnea PND or ankle edema.  The patient was recently treated in the ED for diverticulitis with oral antibiotics.  She is currently being treated for an upper respiratory viral illness by her primary physician.  CT scan of the chest today shows a smooth mildly dilated fusiform ascending aneurysm stable at 4.1 cm.  No increase in size since last year.  No ulceration irregularity or thickening of the aorta noted.  Weight stable no chest pain   Past Medical History:  Diagnosis Date  . Acid reflux   . Allergy   . Aortic aneurysm ([place])   . Asthma   . Cardiomegaly   . Carpal tunnel syndrome   . Gastric polyp   . GERD (gastroesophageal reflux disease) 01/23/2016  . Hiatal hernia   . Hypertension   . Normal spontaneous vaginal delivery    3    Past Surgical History:  Procedure Laterality Date  . CARPAL TUNNEL RELEASE  2009  . CARPAL TUNNEL RELEASE Left   . COLONOSCOPY    . NASAL SINUS SURGERY     DEC 15,2016    Family History  Problem Relation Age of Onset  . Hypertension Mother   . Kidney disease Father   . Liver disease Father        Unknown diagnosis  . Breast cancer Maternal Grandmother   . Hypertension Sister   . Hypertension Brother     Social History Social History   Tobacco Use  . Smoking status: Never Smoker  . Smokeless tobacco: Never Used  Substance Use Topics  . Alcohol use: No    Alcohol/week: 0.0 oz  .  Drug use: No    Current Outpatient Medications  Medication Sig Dispense Refill  . acetaminophen (TYLENOL) 500 MG tablet Take 500 mg by mouth every 4 (four) hours as needed for moderate pain.     Marland Kitchen albuterol (PROVENTIL HFA;VENTOLIN HFA) 108 (90 Base) MCG/ACT inhaler Inhale 1-2 puffs into the lungs every 6 (six) hours as needed for wheezing or shortness of breath. 1 Inhaler 0  . amLODipine (NORVASC) 5 MG tablet Take 1 tablet (5 mg total) by mouth daily. 90 tablet 1  . azelastine (ASTELIN) 0.1 % nasal spray Place 2 sprays into both nostrils 2 (two) times daily. Use in each nostril as directed 30 mL 0  . benzonatate (TESSALON) 100 MG capsule Take 1-2 capsules (100-200 mg total) by mouth 3 (three) times daily as needed for cough. (Patient not taking: Reported on 12/28/2017) 40 capsule 0  . bisoprolol-hydrochlorothiazide (ZIAC) 5-6.25 MG tablet Take 1 tablet by mouth daily. 90 tablet 1  . bisoprolol-hydrochlorothiazide (ZIAC) 5-6.25 MG tablet TAKE 1 TABLET BY MOUTH ONCE DAILY 90 tablet 0  . cyclobenzaprine (FLEXERIL) 10 MG tablet Take 0.5-1 tablets (5-10 mg total) by mouth 3 (three) times daily as needed. 30 tablet 0  . dicyclomine (BENTYL) 10 MG capsule Take 1 capsule (10 mg total)  by mouth as directed. May take 1-2 caps every 8 hours as needed for abdominal cramping 30 capsule 3  . etonogestrel (NEXPLANON) 68 MG IMPL implant 1 each by Subdermal route once.    . Guaifenesin (MUCINEX MAXIMUM STRENGTH) 1200 MG TB12 Take 1 tablet (1,200 mg total) by mouth every 12 (twelve) hours as needed. (Patient not taking: Reported on 12/28/2017) 14 tablet 1  . meloxicam (MOBIC) 15 MG tablet Take 1 tablet (15 mg total) by mouth daily. 30 tablet 0  . Multiple Vitamin (THERA) TABS Take 1 tablet by mouth daily.     . ondansetron (ZOFRAN ODT) 8 MG disintegrating tablet Take 1 tablet (8 mg total) by mouth every 8 (eight) hours as needed for nausea or vomiting. 10 tablet 0  . ranitidine (ZANTAC) 150 MG capsule Take 150 mg by  mouth daily.    . sodium chloride (OCEAN) 0.65 % SOLN nasal spray Place 1 spray into both nostrils 2 (two) times daily.     No current facility-administered medications for this visit.     Allergies  Allergen Reactions  . Clindamycin Diarrhea and Other (See Comments)    GI upset  . Codeine Other (See Comments)    TACHYCARDIA   . Levofloxacin Other (See Comments)    other  . Metronidazole Other (See Comments)    Other  . Minocin [Minocycline Hcl] Other (See Comments)    Stomach Pains  . Moxifloxacin Swelling    other  . Penicillins Rash    Has patient had a PCN reaction causing immediate rash, facial/tongue/throat swelling, SOB or lightheadedness with hypotension:yes Has patient had a PCN reaction causing severe rash involving mucus membranes or skin necrosis:no Has patient had a PCN reaction that required hospitalization:no Has patient had a PCN reaction occurring within the last 10 years:No If all of the above answers are "NO", then may proceed with Cephalosporin    Review of Systems    No shortness of breath No lower extremity edema No dizziness or syncope Positive abdominal pain left side diagnosed with diverticulitis now resolved  BP 132/88   Pulse 80   Resp 20   Ht 5\' 2"  (1.575 m)   Wt 182 lb (82.6 kg)   SpO2 99% Comment: RA  BMI 33.29 kg/m  Physical Exam      Exam    General- alert and comfortable, healthy young AA female    Neck- no JVD, no cervical adenopathy palpable, no carotid bruit   Lungs- clear without rales, wheezes   Cor- regular rate and rhythm, no murmur , gallop   Abdomen- soft, non-tender   Extremities - warm, non-tender, minimal edema   Neuro- oriented, appropriate, no focal weakness  Diagnostic Tests: CT scan images personally reviewed showing a small mild fusiform aneurysm. No change from previous year  Impression: Very nice 47 year old female with hypertension and strong family history of hypertension with mild dilatation of the  ascending aorta - stable 4.1 cm fusiform aneurysm.  She is motivated to control weight, control blood pressure, and to maintain a heart healthy diet and lifestyle.  Plan: Return for scan of thoracic aorta in 1 year.  Add aspirin 81 mg daily.   Len Childs, MD [facility] [phone]

## 2017-12-30 ENCOUNTER — Ambulatory Visit
Admission: RE | Admit: 2017-12-30 | Discharge: 2017-12-30 | Disposition: A | Discharge status: Home / Self Care | Payer: BC Managed Care – PPO | Source: Ambulatory Visit | Attending: Obstetrics & Gynecology | Admitting: Obstetrics & Gynecology

## 2017-12-30 DIAGNOSIS — Z139 Encounter for screening, unspecified: Secondary | ICD-10-CM

## 2018-01-25 ENCOUNTER — Encounter: Payer: Self-pay | Admitting: Physician Assistant

## 2018-03-15 ENCOUNTER — Encounter: Payer: Self-pay | Admitting: Physician Assistant

## 2018-03-15 ENCOUNTER — Other Ambulatory Visit: Payer: Self-pay

## 2018-03-15 ENCOUNTER — Ambulatory Visit (INDEPENDENT_AMBULATORY_CARE_PROVIDER_SITE_OTHER): Payer: BC Managed Care – PPO | Admitting: Physician Assistant

## 2018-03-15 VITALS — BP 130/90 | HR 80 | Temp 98.4°F | Resp 16 | Ht 63.0 in | Wt 176.4 lb

## 2018-03-15 DIAGNOSIS — J329 Chronic sinusitis, unspecified: Secondary | ICD-10-CM

## 2018-03-15 MED ORDER — AZITHROMYCIN 250 MG PO TABS: ORAL_TABLET | ORAL | 0 refills | Status: DC

## 2018-03-15 MED ORDER — PREDNISONE 20 MG PO TABS
ORAL_TABLET | ORAL | 0 refills | Status: DC
Start: 2018-03-15 — End: 2018-05-03

## 2018-03-15 NOTE — Patient Instructions (Addendum)
  Start taking Prednisone: 63m x5 days, 20 mg x 5 days.  If you are not improving in 7 days, start the Azithromycin (Z-pack) laong with the rest of your Prednisone taper.  Continue with nasal saline rinses.  Come back and see me if you are not improving in 10-14 days. We will consider a CT of your sinuses.   Consider acupuncture in the future for prevention therapy [facility] [place].   Thank you for coming in today. I hope you feel we met your needs.  Feel free to call PCP if you have any questions or further requests.  Please consider signing up for MyChart if you do not already have it, as this is a great way to communicate with me.  Best,  Whitney McVey, PA-C  IF you received an x-ray today, you will receive an invoice from [facility]. Please contact [facility] at [phone] questions or concerns regarding your invoice.   IF you received labwork today, you will receive an invoice from [facility] Please contact [facility] at [phone] questions or concerns regarding your invoice.   Our billing staff will not be able to assist you with questions regarding bills from these companies.  You will be contacted with the lab results as soon as they are available. The fastest way to get your results is to activate your My Chart account. Instructions are located on the last page of this paperwork. If you have not heard from [place] the results in 2 weeks, please contact this office.

## 2018-03-15 NOTE — Progress Notes (Signed)
Megan Lang  MRN: [id number] DOB: Oct 20, 1970  PCP: Shawnee Knapp, MD  Subjective:  Pt is a 47 year old female who presents to clinic for sinus problems x 10 days. Endorses sensitivity x 3 weeks. Sinus pressure x 1.5 weeks.  Went to dentist last week. X-rays done about 2 weeks ago.  She had a Z-pack about 4 weeks ago.   She is using saline and Flonase. She takes otc allergy medicine for allergies.   She has a h/o sinusitis. Per chart review she was most recently evaluated by Dr. Wilburn Cornelia otolaryngology at [facility] on 02/27/2018. She has had nasal septoplasty, turbinate reduction and left limited endoscopic sinus surgery on 10/09/2015.    Review of Systems  Constitutional: Negative for chills, diaphoresis, fatigue and fever.  HENT: Positive for congestion, sinus pressure and sinus pain. Negative for postnasal drip, rhinorrhea and sore throat.   Respiratory: Negative for cough, shortness of breath and wheezing.   Psychiatric/Behavioral: Negative for sleep disturbance.    Patient Active Problem List   Diagnosis Date Noted  . Shoulder impingement syndrome, left 12/19/2017  . Scoliosis (and kyphoscoliosis), idiopathic 07/01/2017  . Chronic bilateral back pain 07/01/2017  . Nexplanon in place 07/29/2016  . Cardiomegaly 07/01/2016  . History of aortic aneurysm 07/01/2016  . GERD (gastroesophageal reflux disease) 01/23/2016  . Dyspnea 10/27/2015  . Costochondritis 08/26/2015  . Obesity 07/01/2015  . Upper airway cough syndrome 06/30/2015  . History of shingles 01/26/2015  . Family history of colon cancer 10/27/2011  . Essential hypertension 10/27/2011    Current Outpatient Medications on File Prior to Visit  Medication Sig Dispense Refill  . acetaminophen (TYLENOL) 500 MG tablet Take 500 mg by mouth every 4 (four) hours as needed for moderate pain.     Marland Kitchen albuterol (PROVENTIL HFA;VENTOLIN HFA) 108 (90 Base) MCG/ACT inhaler Inhale 1-2 puffs into the  lungs every 6 (six) hours as needed for wheezing or shortness of breath. 1 Inhaler 0  . amLODipine (NORVASC) 5 MG tablet Take 1 tablet (5 mg total) by mouth daily. 90 tablet 1  . azelastine (ASTELIN) 0.1 % nasal spray Place 2 sprays into both nostrils 2 (two) times daily. Use in each nostril as directed 30 mL 0  . bisoprolol-hydrochlorothiazide (ZIAC) 5-6.25 MG tablet Take 1 tablet by mouth daily. 90 tablet 1  . bisoprolol-hydrochlorothiazide (ZIAC) 5-6.25 MG tablet TAKE 1 TABLET BY MOUTH ONCE DAILY 90 tablet 0  . cyclobenzaprine (FLEXERIL) 10 MG tablet Take 0.5-1 tablets (5-10 mg total) by mouth 3 (three) times daily as needed. 30 tablet 0  . etonogestrel (NEXPLANON) 68 MG IMPL implant 1 each by Subdermal route once.    . meloxicam (MOBIC) 15 MG tablet Take 1 tablet (15 mg total) by mouth daily. 30 tablet 0  . Multiple Vitamin (THERA) TABS Take 1 tablet by mouth daily.     . ondansetron (ZOFRAN ODT) 8 MG disintegrating tablet Take 1 tablet (8 mg total) by mouth every 8 (eight) hours as needed for nausea or vomiting. 10 tablet 0  . ranitidine (ZANTAC) 150 MG capsule Take 150 mg by mouth daily.    . sodium chloride (OCEAN) 0.65 % SOLN nasal spray Place 1 spray into both nostrils 2 (two) times daily.     No current facility-administered medications on file prior to visit.     Allergies  Allergen Reactions  . Clindamycin Diarrhea and Other (See Comments)    GI upset  . Codeine Other (See Comments)  TACHYCARDIA   . Levofloxacin Other (See Comments)    other  . Metronidazole Other (See Comments)    Other  . Minocin [Minocycline Hcl] Other (See Comments)    Stomach Pains  . Moxifloxacin Swelling    other  . Penicillins Rash    Has patient had a PCN reaction causing immediate rash, facial/tongue/throat swelling, SOB or lightheadedness with hypotension:yes Has patient had a PCN reaction causing severe rash involving mucus membranes or skin necrosis:no Has patient had a PCN reaction that  required hospitalization:no Has patient had a PCN reaction occurring within the last 10 years:No If all of the above answers are "NO", then may proceed with Cephalosporin     Objective:  Pulse 80   Temp 98.4 F (36.9 C) (Oral)   Resp 16   Ht 5\' 3"  (1.6 m)   Wt 176 lb 6.4 oz (80 kg)   LMP 03/03/2018   SpO2 100%   BMI 31.25 kg/m   Physical Exam  Constitutional: She is oriented to person, place, and time. No distress.  HENT:  Right Ear: Tympanic membrane normal.  Left Ear: Tympanic membrane normal.  Nose: Mucosal edema present. No rhinorrhea. Right sinus exhibits no maxillary sinus tenderness and no frontal sinus tenderness. Left sinus exhibits no maxillary sinus tenderness and no frontal sinus tenderness.  Mouth/Throat: Mucous membranes are normal.  Cardiovascular: Normal rate, regular rhythm and normal heart sounds.  Pulmonary/Chest: Effort normal and breath sounds normal. No respiratory distress. She has no wheezes. She has no rales.  Neurological: She is alert and oriented to person, place, and time.  Skin: Skin is warm and dry.  Psychiatric: Judgment normal.  Vitals reviewed.   Assessment and Plan :  1. Sinusitis, unspecified chronicity, unspecified location - azithromycin (ZITHROMAX) 250 MG tablet; Take 2 tabs PO x 1 dose, then 1 tab PO QD x 4 days  Dispense: 6 tablet; Refill: 0 - predniSONE (DELTASONE) 20 MG tablet; Take 40mg  x5 days, then 20 mg x 5 days.  Dispense: 15 tablet; Refill: 0 - Pt with long history of pansinusitis presents with 3 weeks of sinus pressures and sensitivity of teeth. Plan PO Prednisone taper and Azithromycin, continue nasal rinses. RTC in 10 days if no improvement. Plan to get CT if no improvement.   Mercer Pod, PA-C  [facility] 03/15/2018 8:20 AM

## 2018-04-14 HISTORY — PX: ROTATOR CUFF REPAIR: SHX139

## 2018-04-21 ENCOUNTER — Other Ambulatory Visit: Payer: Self-pay | Admitting: Family Medicine

## 2018-05-02 ENCOUNTER — Telehealth: Payer: Self-pay | Admitting: *Deleted

## 2018-05-02 NOTE — Telephone Encounter (Signed)
Patient called c/o vaginal itching and discharge asked what to do, I told patient office visit with provider, patient also noted red vaginal bumps. Patient scheduled.

## 2018-05-03 ENCOUNTER — Encounter: Payer: Self-pay | Admitting: Gynecology

## 2018-05-03 ENCOUNTER — Ambulatory Visit (INDEPENDENT_AMBULATORY_CARE_PROVIDER_SITE_OTHER): Payer: BC Managed Care – PPO | Admitting: Gynecology

## 2018-05-03 VITALS — BP 136/84

## 2018-05-03 DIAGNOSIS — N760 Acute vaginitis: Secondary | ICD-10-CM

## 2018-05-03 LAB — WET PREP FOR TRICH, YEAST, CLUE

## 2018-05-03 MED ORDER — FLUCONAZOLE 150 MG PO TABS: 150.0000 mg | ORAL_TABLET | Freq: Every day | ORAL | 0 refills | Status: DC

## 2018-05-03 NOTE — Progress Notes (Signed)
    Megan Lang Chrystal 08-28-1971 [id number]        47 y.o.  [id number] presents with 2 months of vaginal/vulvar itching with some discharge over the last several weeks.  No odor.  No urinary symptoms such as frequency dysuria urgency low back pain fever or chills.  Has noticed several bumps on her vulva over the last several months also.  Past medical history,surgical history, problem list, medications, allergies, family history and social history were all reviewed and documented in the EPIC chart.  Directed ROS with pertinent positives and negatives documented in the history of present illness/assessment and plan.  Exam: Caryn Bee assistant Vitals:   05/03/18 0922  BP: 136/84   General appearance:  Normal Abdomen soft nontender without masses guarding rebound Pelvic external BUS vagina with thick white discharge.  Several small subcutaneous nodules right and left labia majora consistent with sebaceous cysts.  Noninflammatory.  Bimanual exam without masses or tenderness  Assessment/Plan:  47 y.o. [id number] with history and exam as well as wet prep consistent with yeast vaginitis.  Will treat with Diflucan 150 mg daily x2 doses given the length of her symptoms to eradicate the yeast.  We also discussed the probable sebaceous cysts on the vulva.  They are not overly bothersome to her and she is going to follow them for now.  As long as they remain unchanged or resolves then will follow.  If they would enlarge or become symptomatic she will represent for evaluation.    Anastasio Auerbach MD, 9:49 AM 05/03/2018

## 2018-05-03 NOTE — Patient Instructions (Signed)
Take the Diflucan pill daily for 2 days.  Follow-up if the symptoms persist or recur.  Monitor the bumps on the outside of the vagina.  Is long as they remain stable or go away then will follow.  If they would enlarge or become painful then follow-up for reevaluation.

## 2018-06-06 ENCOUNTER — Ambulatory Visit (INDEPENDENT_AMBULATORY_CARE_PROVIDER_SITE_OTHER): Payer: BC Managed Care – PPO

## 2018-06-06 ENCOUNTER — Encounter: Payer: Self-pay | Admitting: Physician Assistant

## 2018-06-06 ENCOUNTER — Other Ambulatory Visit: Payer: Self-pay

## 2018-06-06 ENCOUNTER — Ambulatory Visit (INDEPENDENT_AMBULATORY_CARE_PROVIDER_SITE_OTHER): Payer: BC Managed Care – PPO | Admitting: Physician Assistant

## 2018-06-06 ENCOUNTER — Ambulatory Visit: Payer: Self-pay | Admitting: *Deleted

## 2018-06-06 VITALS — BP 134/84 | HR 88 | Temp 99.2°F | Resp 16 | Ht 63.0 in | Wt 179.0 lb

## 2018-06-06 DIAGNOSIS — R202 Paresthesia of skin: Secondary | ICD-10-CM

## 2018-06-06 DIAGNOSIS — M79602 Pain in left arm: Secondary | ICD-10-CM

## 2018-06-06 DIAGNOSIS — M5030 Other cervical disc degeneration, unspecified cervical region: Secondary | ICD-10-CM

## 2018-06-06 DIAGNOSIS — M79601 Pain in right arm: Secondary | ICD-10-CM

## 2018-06-06 LAB — POCT GLYCOSYLATED HEMOGLOBIN (HGB A1C): Hemoglobin A1C: 5.6 % (ref 4.0–5.6)

## 2018-06-06 MED ORDER — CYCLOBENZAPRINE HCL 10 MG PO TABS: 5.0000 mg | ORAL_TABLET | Freq: Three times a day (TID) | ORAL | 0 refills | Status: DC | PRN

## 2018-06-06 MED ORDER — NAPROXEN 500 MG PO TABS: 500.0000 mg | ORAL_TABLET | Freq: Two times a day (BID) | ORAL | 0 refills | Status: DC

## 2018-06-06 NOTE — Telephone Encounter (Signed)
Pt called with having a rash on her right upper arm, in the bend of her right elbow, left arm and right outer buttock. She had surgery on June 28 th on her left arm. She was given a prescription for meloxicam. She thinks this med has given her the rash. She states it looks like hives and has a burning pain.  She noticed this 3 weeks after her surgery. She called the ortho surgeon and was told to take Benadryl for the itching and to use hydrocortisone cream to the rash. The areas are not getting any better. She denies having a fever or any other symptoms. Appointment scheduled for today. Will route this to Primary Care at [facility].  Reason for Disposition . [1] Localized rash is very painful AND [2] no fever  Answer Assessment - Initial Assessment Questions 1. APPEARANCE of RASH: "Describe the rash."      hives 2. LOCATION: "Where is the rash located?"      Right side of buttock on the outer part, right arm and left arm 3. NUMBER: "How many spots are there?"      About 3 areas that are irritating 4. SIZE: "How big are the spots?" (Inches, centimeters or compare to size of a coin)      Round, quarter size and larger and larger than a quarter on her buttock 5. ONSET: "When did the rash start?"      3 weeks after having surgery 6. ITCHING: "Does the rash itch?" If so, ask: "How bad is the itch?"  (Scale 1-10; or mild, moderate, severe)     Itching, moderate 7. PAIN: "Does the rash hurt?" If so, ask: "How bad is the pain?"  (Scale 1-10; or mild, moderate, severe)     Burning sensation, # 2 and get up to # 5 or 6 8. OTHER SYMPTOMS: "Do you have any other symptoms?" (e.g., fever)     Some achyness near shoulder blade and wrist 9. PREGNANCY: "Is there any chance you are pregnant?" "When was your last menstrual period?"     Spotting right now, heavy bleeding last week  Protocols used: RASH OR REDNESS - LOCALIZED-A-AH

## 2018-06-06 NOTE — Progress Notes (Signed)
06/06/2018 10:36 AM   DOB: [birth year] / MRN: [id number]  SUBJECTIVE:  Megan Lang is a 47 y.o. female presenting for bilateral burning about the bilateral brachioradialis musculature.  She also reports this in the bilateral shoulders. Symptoms present for a few days now.  The problem is waxing and waning.  She is allergic to moxifloxacin; clindamycin; codeine; levofloxacin; metronidazole; minocin [minocycline hcl]; and penicillins.   She  has a past medical history of Acid reflux, Allergy, Aortic aneurysm ([place]), Asthma, Cardiomegaly, Carpal tunnel syndrome, Gastric polyp, GERD (gastroesophageal reflux disease) (01/23/2016), Hiatal hernia, Hypertension, and Normal spontaneous vaginal delivery.    She  reports that she has never smoked. She has never used smokeless tobacco. She reports that she does not drink alcohol or use drugs. She  reports that she currently engages in sexual activity and has had partner(s) who are Female. She reports using the following method of birth control/protection: Injection. The patient  has a past surgical history that includes Carpal tunnel release (2009); Carpal tunnel release (Left); Nasal sinus surgery; and Colonoscopy.  Her family history includes Breast cancer in her maternal grandmother; Hypertension in her brother, mother, and sister; Kidney disease in her father; Liver disease in her father.  Review of Systems  Constitutional: Negative for chills, diaphoresis and fever.  Eyes: Negative.   Respiratory: Negative for cough, hemoptysis, sputum production, shortness of breath and wheezing.   Cardiovascular: Negative for chest pain, orthopnea and leg swelling.  Gastrointestinal: Negative for nausea.  Skin: Negative for rash.  Neurological: Positive for tingling. Negative for dizziness, sensory change, speech change, focal weakness, seizures, loss of consciousness, weakness and headaches.    The problem list and medications were reviewed and updated by myself  where necessary and exist elsewhere in the encounter.   OBJECTIVE:  BP 134/84   Pulse 88   Temp 99.2 F (37.3 C)   Resp 16   Ht 5\' 3"  (1.6 m)   Wt 179 lb (81.2 kg)   SpO2 98%   BMI 31.71 kg/m   Wt Readings from Last 3 Encounters:  06/06/18 179 lb (81.2 kg)  03/15/18 176 lb 6.4 oz (80 kg)  12/28/17 182 lb (82.6 kg)   Temp Readings from Last 3 Encounters:  06/06/18 99.2 F (37.3 C)  03/15/18 98.4 F (36.9 C) (Oral)  11/24/17 98.3 F (36.8 C) (Oral)   BP Readings from Last 3 Encounters:  06/06/18 134/84  05/03/18 136/84  03/15/18 130/90   Pulse Readings from Last 3 Encounters:  06/06/18 88  03/15/18 80  12/28/17 80    Physical Exam  Constitutional: She is oriented to person, place, and time. She appears well-nourished. No distress.  Eyes: Pupils are equal, round, and reactive to light. EOM are normal.  Cardiovascular: Normal rate, regular rhythm, S1 normal, S2 normal, normal heart sounds and intact distal pulses. Exam reveals no gallop, no friction rub and no decreased pulses.  No murmur heard. Pulmonary/Chest: Effort normal. She has no rales.  Abdominal: She exhibits no distension.  Musculoskeletal: She exhibits no edema.  Neurological: She is alert and oriented to person, place, and time. She displays normal reflexes. No cranial nerve deficit or sensory deficit. She exhibits normal muscle tone. Coordination and gait normal.  Skin: Skin is dry. No rash noted. She is not diaphoretic.  Psychiatric: Her mood appears anxious.  Vitals reviewed.   Lab Results  Component Value Date   HGBA1C 5.6 06/06/2018    Lab Results  Component Value Date  WBC 4.3 11/30/2017   HGB 13.7 11/30/2017   HCT 41.3 11/30/2017   MCV 89.0 11/30/2017   PLT 295 11/30/2017    Lab Results  Component Value Date   CREATININE 0.76 11/30/2017   BUN 12 11/30/2017   NA 137 11/30/2017   K 3.4 (L) 11/30/2017   CL 105 11/30/2017   CO2 23 11/30/2017    Lab Results  Component Value  Date   ALT 20 11/30/2017   AST 21 11/30/2017   ALKPHOS 67 06/20/2017   BILITOT 0.8 11/30/2017    Lab Results  Component Value Date   TSH 0.79 11/30/2017    Lab Results  Component Value Date   CHOL 156 11/30/2017   HDL 56 11/30/2017   LDLCALC 88 11/30/2017   TRIG 40 11/30/2017   CHOLHDL 2.8 11/30/2017    Dg Cervical Spine 2 Or 3 Views  Result Date: 06/06/2018 CLINICAL DATA:  Paresthesias, pain in both extremities EXAM: CERVICAL SPINE - 2-3 VIEW COMPARISON:  None. FINDINGS: There is no evidence of cervical spine fracture or prevertebral soft tissue swelling. Alignment is normal. No other significant bone abnormalities are identified. Mild degenerative disc disease with disc height loss C5-6 IMPRESSION: Mild degenerative disc disease with disc height loss at C5-6 C6-7. Electronically Signed   By: Kathreen Devoid   On: 06/06/2018 10:19    ASSESSMENT AND PLAN:  Megan Lang was seen today for rash.  Diagnoses and all orders for this visit:  Paresthesia and pain of both upper extremities -     DG Cervical Spine 2 or 3 views; Future -     CMP and Liver -     TSH -     POCT glycosylated hemoglobin (Hb A1C) -     HIV antibody -     cyclobenzaprine (FLEXERIL) 10 MG tablet; Take 0.5-1 tablets (5-10 mg total) by mouth 3 (three) times daily as needed. -     Ambulatory referral to Physical Therapy  DDD (degenerative disc disease), cervical -     naproxen (NAPROSYN) 500 MG tablet; Take 1 tablet (500 mg total) by mouth 2 (two) times daily with a meal.    The patient is advised to call or return to clinic if she does not see an improvement in symptoms, or to seek the care of the closest emergency department if she worsens with the above plan.   Philis Fendt, MHS, PA-C [facility] 06/06/2018 10:36 AM

## 2018-06-06 NOTE — Patient Instructions (Signed)
° ° ° °  If you have lab work done today you will be contacted with your lab results within the next 2 weeks.  If you have not heard from us then please contact us. The fastest way to get your results is to register for My Chart. ° ° °IF you received an x-ray today, you will receive an invoice from [facility]. Please contact  Radiology at [phone] with questions or concerns regarding your invoice.  ° °IF you received labwork today, you will receive an invoice from [facility]. Please contact [facility] at [phone] with questions or concerns regarding your invoice.  ° °Our billing staff will not be able to assist you with questions regarding bills from these companies. ° °You will be contacted with the lab results as soon as they are available. The fastest way to get your results is to activate your My Chart account. Instructions are located on the last page of this paperwork. If you have not heard from us regarding the results in 2 weeks, please contact this office. °  ° ° ° °

## 2018-06-07 LAB — CMP AND LIVER
ALT: 21 IU/L (ref 0–32)
AST: 20 IU/L (ref 0–40)
Albumin: 4.8 g/dL (ref 3.5–5.5)
Alkaline Phosphatase: 75 IU/L (ref 39–117)
BILIRUBIN TOTAL: 0.9 mg/dL (ref 0.0–1.2)
BUN: 10 mg/dL (ref 6–24)
Bilirubin, Direct: 0.22 mg/dL (ref 0.00–0.40)
CALCIUM: 9.9 mg/dL (ref 8.7–10.2)
CO2: 22 mmol/L (ref 20–29)
Chloride: 102 mmol/L (ref 96–106)
Creatinine, Ser: 0.8 mg/dL (ref 0.57–1.00)
GFR, EST AFRICAN AMERICAN: 102 mL/min/{1.73_m2} (ref >59)
GFR, EST NON AFRICAN AMERICAN: 88 mL/min/{1.73_m2} (ref >59)
GLUCOSE: 90 mg/dL (ref 65–99)
Potassium: 3.6 mmol/L (ref 3.5–5.2)
Sodium: 141 mmol/L (ref 134–144)
Total Protein: 7.9 g/dL (ref 6.0–8.5)

## 2018-06-07 LAB — TSH: TSH: 0.871 u[IU]/mL (ref 0.450–4.500)

## 2018-06-07 LAB — HIV ANTIBODY (ROUTINE TESTING W REFLEX): HIV Screen 4th Generation wRfx: NONREACTIVE

## 2018-06-08 ENCOUNTER — Encounter (HOSPITAL_COMMUNITY): Payer: Self-pay

## 2018-06-08 ENCOUNTER — Other Ambulatory Visit: Payer: Self-pay

## 2018-06-08 ENCOUNTER — Emergency Department (HOSPITAL_COMMUNITY)
Admission: EM | Admit: 2018-06-08 | Discharge: 2018-06-08 | Disposition: A | Discharge status: Home / Self Care | Payer: BC Managed Care – PPO | Attending: Emergency Medicine | Admitting: Emergency Medicine

## 2018-06-08 DIAGNOSIS — J45909 Unspecified asthma, uncomplicated: Secondary | ICD-10-CM | POA: Insufficient documentation

## 2018-06-08 DIAGNOSIS — Z79899 Other long term (current) drug therapy: Secondary | ICD-10-CM | POA: Diagnosis not present

## 2018-06-08 DIAGNOSIS — M79602 Pain in left arm: Secondary | ICD-10-CM | POA: Diagnosis present

## 2018-06-08 DIAGNOSIS — I10 Essential (primary) hypertension: Secondary | ICD-10-CM | POA: Insufficient documentation

## 2018-06-08 DIAGNOSIS — M50322 Other cervical disc degeneration at C5-C6 level: Secondary | ICD-10-CM | POA: Diagnosis not present

## 2018-06-08 DIAGNOSIS — M5030 Other cervical disc degeneration, unspecified cervical region: Secondary | ICD-10-CM

## 2018-06-08 DIAGNOSIS — M50323 Other cervical disc degeneration at C6-C7 level: Secondary | ICD-10-CM | POA: Insufficient documentation

## 2018-06-08 NOTE — ED Triage Notes (Signed)
She states she recently had left shoulder arthroscopy by Dr. Onnie Graham. She is here today with c/o paresthesias of bilat. Arms. She denies any injury. She recently saw her pcp, who discussed m.r.i. She is in no distress.

## 2018-06-08 NOTE — ED Provider Notes (Signed)
[facility] DEPT Provider Note  CSN: [id number] Arrival date & time: 06/08/18  1508  History   Chief Complaint Chief Complaint  Patient presents with  . Arm Pain    HPI Megan Lang is a 46 y.o. female with a medical history of aortic aneurysm, asthma, GERD, HTN and carpal tunnel syndrome who presented to the ED for bilateral arm pain x3 weeks. Patient reports having left rotator cuff surgery 8 weeks ago and that she began experiencing bilateral arm pain from lower neck to wrist that she describes as numbing, tingling and burning. Also describes neck tightness. Patient states that she went to primary care 2 days ago where they started her on a steroid taper and prescribed Flexeril which the patient has not taken yet. Denies fever, skin rashes, decreased ROM or extremity weakness. Other than recent surgery, patient has not had any falls, injuries or traumas.  Additional history obtained by medical chart. Cervical x-ray performed on 06/06/18 which showed degenerative disc disease seen at C5-C7.  Past Medical History:  Diagnosis Date  . Acid reflux   . Allergy   . Aortic aneurysm ([place])   . Asthma   . Cardiomegaly   . Carpal tunnel syndrome   . Gastric polyp   . GERD (gastroesophageal reflux disease) 01/23/2016  . Hiatal hernia   . Hypertension   . Normal spontaneous vaginal delivery    3    Patient Active Problem List   Diagnosis Date Noted  . Shoulder impingement syndrome, left 12/19/2017  . Scoliosis (and kyphoscoliosis), idiopathic 07/01/2017  . Chronic bilateral back pain 07/01/2017  . Nexplanon in place 07/29/2016  . Cardiomegaly 07/01/2016  . History of aortic aneurysm 07/01/2016  . GERD (gastroesophageal reflux disease) 01/23/2016  . Dyspnea 10/27/2015  . Costochondritis 08/26/2015  . Obesity 07/01/2015  . Upper airway cough syndrome 06/30/2015  . History of shingles 01/26/2015  . Family history of colon cancer 10/27/2011  . Essential  hypertension 10/27/2011    Past Surgical History:  Procedure Laterality Date  . CARPAL TUNNEL RELEASE  2009  . CARPAL TUNNEL RELEASE Left   . COLONOSCOPY    . NASAL SINUS SURGERY     DEC 15,2016     OB History    Gravida  3   Para  3   Term  3   Preterm      AB      Living  3     SAB      TAB      Ectopic      Multiple      Live Births  3            Home Medications    Prior to Admission medications   Medication Sig Start Date End Date Taking? Authorizing Provider  acetaminophen (TYLENOL) 500 MG tablet Take 500 mg by mouth every 4 (four) hours as needed for moderate pain.     [provider]  albuterol (PROVENTIL HFA;VENTOLIN HFA) 108 (90 Base) MCG/ACT inhaler Inhale 1-2 puffs into the lungs every 6 (six) hours as needed for wheezing or shortness of breath. 09/03/16   Elnora Morrison, MD  amLODipine (NORVASC) 5 MG tablet TAKE 1 TABLET BY MOUTH ONCE DAILY 04/21/18   Shawnee Knapp, MD  azelastine (ASTELIN) 0.1 % nasal spray Place 2 sprays into both nostrils 2 (two) times daily. Use in each nostril as directed 11/16/17   Harrison Mons, PA-C  bisoprolol-hydrochlorothiazide (ZIAC) 5-6.25 MG tablet Take 1 tablet  by mouth daily. 06/30/17   Shawnee Knapp, MD  cyclobenzaprine (FLEXERIL) 10 MG tablet Take 0.5-1 tablets (5-10 mg total) by mouth 3 (three) times daily as needed. 06/06/18   Tereasa Coop, PA-C  etonogestrel (NEXPLANON) 68 MG IMPL implant 1 each by Subdermal route once.    [provider]  fluconazole (DIFLUCAN) 150 MG tablet Take 1 tablet (150 mg total) by mouth daily. For 2 days 05/03/18   Fontaine, Belinda Block, MD  fluticasone (FLONASE) 50 MCG/ACT nasal spray Place into the nose. 02/27/18 02/27/19  [provider]  naproxen (NAPROSYN) 500 MG tablet Take 1 tablet (500 mg total) by mouth 2 (two) times daily with a meal. 06/06/18   Tereasa Coop, PA-C  ondansetron (ZOFRAN ODT) 8 MG disintegrating tablet Take 1 tablet (8 mg total) by mouth  every 8 (eight) hours as needed for nausea or vomiting. 06/20/17   Jola Schmidt, MD  predniSONE (STERAPRED UNI-PAK 21 TAB) 5 MG (21) TBPK tablet See admin instructions. 06/07/18   [provider]  ranitidine (ZANTAC) 150 MG capsule Take 150 mg by mouth daily. 05/31/17   [provider]  sodium chloride (OCEAN) 0.65 % SOLN nasal spray Place 1 spray into both nostrils 2 (two) times daily.    [provider]    Family History Family History  Problem Relation Age of Onset  . Hypertension Mother   . Kidney disease Father   . Liver disease Father        Unknown diagnosis  . Breast cancer Maternal Grandmother   . Hypertension Sister   . Hypertension Brother     Social History Social History   Tobacco Use  . Smoking status: Never Smoker  . Smokeless tobacco: Never Used  Substance Use Topics  . Alcohol use: No    Alcohol/week: 0.0 standard drinks  . Drug use: No     Allergies   Moxifloxacin; Clindamycin; Codeine; Levofloxacin; Metronidazole; Minocin [minocycline hcl]; and Penicillins   Review of Systems Review of Systems  Constitutional: Negative for chills and fever.  Musculoskeletal: Positive for neck pain. Negative for gait problem and neck stiffness.  Skin: Negative.   Neurological: Positive for numbness. Negative for weakness.  Hematological: Negative.      Physical Exam Updated Vital Signs BP (!) 157/94 (BP Location: Left Arm)   Pulse (!) 106   Temp 99 F (37.2 C) (Oral)   Resp 18   SpO2 100%   Physical Exam  Constitutional: She appears well-developed and well-nourished. No distress.  Musculoskeletal:       Left shoulder: She exhibits decreased range of motion and tenderness.       Cervical back: She exhibits tenderness. She exhibits normal range of motion and no bony tenderness.  Limited ROM in left shoulder due to recent surgery. Remaining upper extremity joints have full active and passive ROM bilaterally with 5/5 strength. No  midline tenderness over cervical spine. Trapezius muscle tenderness and spasm palpated bilaterally.  Neurological: She has normal strength. She displays no atrophy. No sensory deficit. She exhibits normal muscle tone.  Reflex Scores:      Tricep reflexes are 2+ on the right side and 2+ on the left side.      Bicep reflexes are 2+ on the right side and 2+ on the left side.      Brachioradialis reflexes are 2+ on the right side and 2+ on the left side. Skin: Skin is warm and intact. Capillary refill takes less than 2 seconds.  No rash noted.  Nursing note and vitals reviewed.   ED Treatments / Results  Labs (all labs ordered are listed, but only abnormal results are displayed) Labs Reviewed - No data to display  EKG None  Radiology No results found.  Procedures Procedures (including critical care time)  Medications Ordered in ED Medications - No data to display   Initial Impression / Assessment and Plan / ED Course  Triage vital signs and the nursing notes have been reviewed.  Pertinent labs & imaging results that were available during care of the patient were reviewed and considered in medical decision making (see chart for details).  Patient presents with bilateral neuropathic upper extremity pain. Physical exam is unremarkable. Patient previously evaluated by PCP on 06/06/18 for this. There have been no acute changes in quality of pain since then. Imaging done at PCP shows DDD of C5-C7 which correlates with associated pain patient is experiencing. Pain currently 4/10. There have been no acute changes in pain that warrant additional imaging today. She has no constitutional s/s that suggest an underlying infectious or rheumatologic etiology.    Final Clinical Impressions(s) / ED Diagnoses  1. Bilateral Arm Pain 2/2 DDD of C5-C7. Patient previously prescribed steroid taper, Flexeril and NSAID by PCP. Education provided on medication indication and administration. Advised to follow-up  with PCP for further imaging in 3-4 weeks.  Dispo: Home. After thorough clinical evaluation, this patient is determined to be medically stable and can be safely discharged with the previously mentioned treatment and/or outpatient follow-up/referral(s). At this time, there are no other apparent medical conditions that require further screening, evaluation or treatment.   Final diagnoses:  DDD (degenerative disc disease), cervical    ED Discharge Orders    None        Romie Jumper, PA-C 06/08/18 [place], [place], DO 06/08/18 2348

## 2018-06-08 NOTE — Discharge Instructions (Addendum)
X-ray done on 06/06/18 shows mild degenerative disc disease from C5-C7. That means that you have some arthritis in these places in your neck. This arthritis is causing nerve irritation in the nerves that supply your arms and shoulders. The pain is originating from nerve irritation in your neck.  The medications that you were prescribed at [facility] will help with the inflammation and pain. Take the steroids as prescribed. No special instructions needed. Try taking the Cyclobenzaprine (Flexeril) at night. This is a muscle relaxer and will help with the spasms and pain in your neck. Hopefully, you will sleep better with this. If the medication makes you too sleepy, you can split it in half. You can take the Naprosyn with any of the other medication above. If you are concerned about your stomach, take this with food. You can also begin taking acid reflux medication like Protonix, Prilosec or Zantac with this for extra protection.  Applying heat compresses to your neck may provide you with additional relief as well.  I recommend that you follow-up with your PCP in 3-4 weeks if this arm pain continues to be an issue. He/she may decide to do a MRI at that time.  Thank you for allowing me to take care of you today!

## 2018-06-21 ENCOUNTER — Other Ambulatory Visit: Payer: Self-pay

## 2018-06-21 ENCOUNTER — Ambulatory Visit: Payer: BC Managed Care – PPO | Attending: Physician Assistant | Admitting: Physical Therapy

## 2018-06-21 ENCOUNTER — Encounter: Payer: Self-pay | Admitting: Physical Therapy

## 2018-06-21 DIAGNOSIS — M62838 Other muscle spasm: Secondary | ICD-10-CM | POA: Diagnosis present

## 2018-06-21 DIAGNOSIS — M542 Cervicalgia: Secondary | ICD-10-CM | POA: Diagnosis present

## 2018-06-21 DIAGNOSIS — R208 Other disturbances of skin sensation: Secondary | ICD-10-CM | POA: Diagnosis present

## 2018-06-21 NOTE — Therapy (Signed)
[place], [place], [address] Phone: [phone]   Fax:  [phone]  Physical Therapy Evaluation  Patient Details  Name: Megan Lang MRN: [id number] Date of Birth: Jan 08, 1971 Referring Provider: Philis Fendt PA-C   Encounter Date: 06/21/2018  PT End of Session - 06/21/18 1159    Visit Number  1    Number of Visits  13    Date for PT Re-Evaluation  08/02/18    Authorization Type  [employer]    PT Start Time  1149    PT Stop Time  1233    PT Time Calculation (min)  44 min    Activity Tolerance  Patient tolerated treatment well    Behavior During Therapy  [facility] for tasks assessed/performed       Past Medical History:  Diagnosis Date  . Acid reflux   . Allergy   . Aortic aneurysm ([place])   . Asthma   . Cardiomegaly   . Carpal tunnel syndrome   . Gastric polyp   . GERD (gastroesophageal reflux disease) 01/23/2016  . Hiatal hernia   . Hypertension   . Normal spontaneous vaginal delivery    3    Past Surgical History:  Procedure Laterality Date  . CARPAL TUNNEL RELEASE  2009  . CARPAL TUNNEL RELEASE Left   . COLONOSCOPY    . NASAL SINUS SURGERY     DEC 15,2016  . ROTATOR CUFF REPAIR Left 04/14/2018    There were no vitals filed for this visit.   Subjective Assessment - 06/21/18 1205    Subjective  pt is 47 y.o F with CC of bil UE N/T that started a couple weeks following L RCR surgery on 04/14/2018. pt report it started on the left and has progressed to the R. She reports it goes to her hand and her fingers reporting N/T and intermittent sensation of warmth with combined tightness in the low back as well. she currently is in physical threapy at [facility] for her l shoulder RCR. reports no hx of N/T prior to this or problems with her neck. Since onset she reports it has improved alittle but still fluctuates.     Pertinent History  hx or RCR repair,     Limitations  Lifting    How long can you sit  comfortably?  1 hour    How long can you stand comfortably?  20-30 min     How long can you walk comfortably?  20-30 min     Diagnostic tests  8/20 x-rayd and MRI    Patient Stated Goals  decrease the N/T and pain and provide relief,     Currently in Pain?  Yes    Pain Score  3    at worst 7/10    Pain Location  Arm    Pain Orientation  Right;Left    Pain Descriptors / Indicators  Pins and needles;Shooting   warmth, itchy    Pain Type  Chronic pain    Pain Radiating Towards  down both arms into hands and fingers of digits 2-5    Pain Onset  More than a month ago    Pain Frequency  Intermittent    Aggravating Factors   Laying down, moving the head, more to the L>R    Pain Relieving Factors  chin tucks, heating pad, ice pack,     Effect of Pain on Daily Activities  limited endurance laying down and pain  [facility] PT Assessment - 06/21/18 1157      Assessment   Medical Diagnosis  bil UE parasthesia and pain    Referring Provider  Philis Fendt PA-C    Onset Date/Surgical Date  --   04/14/2018   Hand Dominance  Left    Next MD Visit  --   unsure   Prior Therapy  yes   for multiple conditions     Precautions   Precautions  None    Precaution Comments  avoiding heavy lifting with LUE due to RCR precautions      Restrictions   Weight Bearing Restrictions  No      Balance Screen   Has the patient fallen in the past 6 months  No      [place] residence    Living Arrangements  Spouse/significant other    Available Help at Discharge  Family;Available PRN/intermittently    Type of Home  House    Home Access  Level entry    Home Layout  Two level    Alternate Level Stairs-Number of Steps  16    Alternate Level Stairs-Rails  Can reach both    Home Equipment  --   brace,      Prior Function   Level of Independence  Independent with basic ADLs    Vocation  Full time employment   custodian   Vocation Requirements  lifting, pushing/  pulling, carrying    Leisure  movies, shopping, eating out,       Cognition   Overall Cognitive Status  Within Functional Limits for tasks assessed      Observation/Other Assessments   Focus on Therapeutic Outcomes (FOTO)   50% limited   predicted 35% limited     Posture/Postural Control   Posture/Postural Control  Postural limitations    Postural Limitations  Rounded Shoulders;Forward head      ROM / Strength   AROM / PROM / Strength  AROM;Strength;PROM      AROM   AROM Assessment Site  Cervical;Shoulder    Cervical Flexion  42    Cervical Extension  50   reproduced symtpoms down RUE at end range   Cervical - Right Side Bend  32   pain on the R during the motion   Cervical - Left Side Bend  32    Cervical - Right Rotation  58    Cervical - Left Rotation  58   end range tightness in the upper trap     Strength   Strength Assessment Site  Shoulder    Right/Left Shoulder  Right;Left      Special Tests    Special Tests  Cervical    Cervical Tests  Spurling's;Dictraction;other                Objective measurements completed on examination: See above findings.      [facility] PT Treatment/Exercise - 06/21/18 1157      Exercises   Exercises  Shoulder;Neck      Neck Exercises: Seated   Other Seated Exercise  thoracic rotation 2 x 10 bil with arms crossed      Neck Exercises: Supine   Neck Retraction  10 reps;5 secs   chin tuck head lift     Shoulder Exercises: Stretch   Other Shoulder Stretches  upper trap 2 x 30 sec      Manual Therapy   Manual therapy comments  sub-occipital release x 5 min  PT Education - 06/21/18 1158    Education Details  evaluation findings, POC, goals, HEP with proper form/ rationale, anatomy of the area involved    Person(s) Educated  Patient    Methods  Explanation;Verbal cues    Comprehension  Verbalized understanding;Verbal cues required       PT Short Term Goals - 06/21/18 1159      PT SHORT TERM  GOAL #1   Title  pt to be I with inital HEP     Time  3    Period  Weeks    Status  New    Target Date  07/12/18      PT SHORT TERM GOAL #2   Title  pt to verbalize/ demo proper posture and lifting mechanics to prevent and reduce neck pain    Time  3    Period  Weeks    Status  New    Target Date  07/12/18      PT SHORT TERM GOAL #3   Title  pt to demonstrate decreased upper trap spasm bil to decrease pain to </= 4/10 and promote cervical mobility     Time  3    Period  Weeks    Status  New    Target Date  07/12/18        PT Long Term Goals - 06/21/18 1302      PT LONG TERM GOAL #1   Title  pt to incrase cervical flexion/ exension by >/ 8 degrees and sidebending/ rotation by >/= 10 degrees with <= 2/10 pain or report of referral symptoms for safety with driving     Time  6    Period  Weeks    Status  New    Target Date  08/02/18      PT LONG TERM GOAL #2   Title  pt to report numbness/ tingling occurrence in the bil UE happening </= 1 x a week to promote improvement in condition    Time  6    Period  Weeks    Status  New    Target Date  08/02/18      PT LONG TERM GOAL #3   Title  pt to return to normal work related activities lifting/ pushing/ pulling as allowed for LUE (RCR) with </=2/10 and no report of N/T in bil UE    Time  6    Period  Weeks    Status  New    Target Date  08/02/18      PT LONG TERM GOAL #4   Title  pt to improve FOTO score to </= 35% limited to demo improvement in function     Time  6    Period  Weeks    Status  New    Target Date  08/02/18      PT LONG TERM GOAL #5   Title  pt to be I with all HEP given as of last visit to maintain and progress current level of function     Time  6    Period  Weeks    Status  New    Target Date  08/02/18             Plan - 06/21/18 1247    Clinical Impression Statement  pt present to OPPT with CC of bil UE parasthesia starting on the L that has progressed to the R and extends to the hands  and digits 2-5 that started following her  L RCR on 04/14/2018. pt is being seen by another PT clinic for her RCR so Focused on cervical ROM which she has functional mobility but noted reproduction of symptoms in all planes bil. TTP in bil upper trap, sub-occipitals, levator scapulae, and cervical C4-C7 hypomobility. She would benefit from physical therapy to decrease bil UE N/T, and bil tightness, promote cervical mobility, and return to PLOF by addressing the deficits listed.     History and Personal Factors relevant to plan of care:  hx of L RCR, HTN    Clinical Presentation  Evolving    Clinical Presentation due to:  started with L N/T progressing to R, muscle spasm, intermittent pain, abnormal posture    Clinical Decision Making  Moderate    Rehab Potential  Good    PT Frequency  2x / week    PT Duration  6 weeks    PT Treatment/Interventions  ADLs/Self Care Home Management;Cryotherapy;Electrical Stimulation;Iontophoresis 4mg /ml Dexamethasone;Moist Heat;Traction;Ultrasound;Therapeutic activities;Therapeutic exercise;Manual techniques;Taping;Vasopneumatic Device    PT Next Visit Plan  review/ HEP, cervical mobs, upper trap STW, posture education,  could trial mechanical traction     PT Home Exercise Plan  upper trap stretch, chin tuck head lift, upper cervical rotation, thoracic rotation    Consulted and Agree with Plan of Care  Patient       Patient will benefit from skilled therapeutic intervention in order to improve the following deficits and impairments:  Pain, Decreased activity tolerance, Decreased endurance, Postural dysfunction, Improper body mechanics, Increased fascial restricitons, Decreased strength, Decreased range of motion, Impaired UE functional use  Visit Diagnosis: Other disturbances of skin sensation  Other muscle spasm  Cervicalgia     Problem List Patient Active Problem List   Diagnosis Date Noted  . Shoulder impingement syndrome, left 12/19/2017  . Scoliosis  (and kyphoscoliosis), idiopathic 07/01/2017  . Chronic bilateral back pain 07/01/2017  . Nexplanon in place 07/29/2016  . Cardiomegaly 07/01/2016  . History of aortic aneurysm 07/01/2016  . GERD (gastroesophageal reflux disease) 01/23/2016  . Dyspnea 10/27/2015  . Costochondritis 08/26/2015  . Obesity 07/01/2015  . Upper airway cough syndrome 06/30/2015  . History of shingles 01/26/2015  . Family history of colon cancer 10/27/2011  . Essential hypertension 10/27/2011   Starr [place] PT, DPT, LAT, ATC  06/21/18  1:08 PM      [place] [facility] [address] [place], [place], [address] Phone: [phone]   Fax:  [phone]  Name: NYRIAH COOTE MRN: [id number] Date of Birth: Mar 01, 1971

## 2018-06-26 ENCOUNTER — Ambulatory Visit: Payer: BC Managed Care – PPO | Admitting: Physical Therapy

## 2018-06-26 DIAGNOSIS — M62838 Other muscle spasm: Secondary | ICD-10-CM

## 2018-06-26 DIAGNOSIS — R208 Other disturbances of skin sensation: Secondary | ICD-10-CM | POA: Diagnosis not present

## 2018-06-26 DIAGNOSIS — M542 Cervicalgia: Secondary | ICD-10-CM

## 2018-06-26 NOTE — Therapy (Signed)
[place], [place], [address] Phone: [phone]   Fax:  [phone]  Physical Therapy Treatment  Patient Details  Name: Megan Lang MRN: [id number] Date of Birth: 1971-10-08 Referring Provider: Philis Fendt PA-C   Encounter Date: 06/26/2018  PT End of Session - 06/26/18 1449    Visit Number  2    Number of Visits  13    Date for PT Re-Evaluation  08/02/18    Authorization Type  BCBS    PT Start Time  1408    PT Stop Time  1458    PT Time Calculation (min)  50 min    Activity Tolerance  Patient tolerated treatment well    Behavior During Therapy  [facility] for tasks assessed/performed       Past Medical History:  Diagnosis Date  . Acid reflux   . Allergy   . Aortic aneurysm ([place])   . Asthma   . Cardiomegaly   . Carpal tunnel syndrome   . Gastric polyp   . GERD (gastroesophageal reflux disease) 01/23/2016  . Hiatal hernia   . Hypertension   . Normal spontaneous vaginal delivery    3    Past Surgical History:  Procedure Laterality Date  . CARPAL TUNNEL RELEASE  2009  . CARPAL TUNNEL RELEASE Left   . COLONOSCOPY    . NASAL SINUS SURGERY     DEC 15,2016  . ROTATOR CUFF REPAIR Left 04/14/2018    There were no vitals filed for this visit.  Subjective Assessment - 06/26/18 1412    Subjective  Pt relays she is still having N/T in both arms and that this is not improving.    Currently in Pain?  Yes    Pain Score  2     Pain Location  Arm    Pain Orientation  Right;Left    Pain Descriptors / Indicators  Numbness;Tingling;Pins and needles                       [facility] Adult PT Treatment/Exercise - 06/26/18 0001      Exercises   Exercises  Shoulder;Neck      Neck Exercises: Seated   Neck Retraction  20 reps    Cervical Rotation  10 reps;Both    Other Seated Exercise  thoracic rotation 2 x 10 bil with arms crossed      Shoulder Exercises: Seated   Row  20 reps       Shoulder Exercises: Pulleys   Flexion  3 minutes      Modalities   Modalities  Cryotherapy;Electrical Stimulation      Cryotherapy   Number Minutes Cryotherapy  15 Minutes    Cryotherapy Location  Cervical    Type of Cryotherapy  Ice pack      Electrical Stimulation   Electrical Stimulation Location  neck    Electrical Stimulation Action  IFC    Electrical Stimulation Parameters  tolerance    Electrical Stimulation Goals  Pain;Tone      Manual Therapy   Manual therapy comments  sub-occipital release x 5 min, manual traction with towel 5 min, C-T spine CPA mobs grade II X5 min      Neck Exercises: Stretches   Upper Trapezius Stretch  Right;Left;2 reps;30 seconds               PT Short Term Goals - 06/21/18 1159      PT SHORT TERM GOAL #1  Title  pt to be I with inital HEP     Time  3    Period  Weeks    Status  New    Target Date  07/12/18      PT SHORT TERM GOAL #2   Title  pt to verbalize/ demo proper posture and lifting mechanics to prevent and reduce neck pain    Time  3    Period  Weeks    Status  New    Target Date  07/12/18      PT SHORT TERM GOAL #3   Title  pt to demonstrate decreased upper trap spasm bil to decrease pain to </= 4/10 and promote cervical mobility     Time  3    Period  Weeks    Status  New    Target Date  07/12/18        PT Long Term Goals - 06/21/18 1302      PT LONG TERM GOAL #1   Title  pt to incrase cervical flexion/ exension by >/ 8 degrees and sidebending/ rotation by >/= 10 degrees with <= 2/10 pain or report of referral symptoms for safety with driving     Time  6    Period  Weeks    Status  New    Target Date  08/02/18      PT LONG TERM GOAL #2   Title  pt to report numbness/ tingling occurrence in the bil UE happening </= 1 x a week to promote improvement in condition    Time  6    Period  Weeks    Status  New    Target Date  08/02/18      PT LONG TERM GOAL #3   Title  pt to return to normal work related  activities lifting/ pushing/ pulling as allowed for LUE (RCR) with </=2/10 and no report of N/T in bil UE    Time  6    Period  Weeks    Status  New    Target Date  08/02/18      PT LONG TERM GOAL #4   Title  pt to improve FOTO score to </= 35% limited to demo improvement in function     Time  6    Period  Weeks    Status  New    Target Date  08/02/18      PT LONG TERM GOAL #5   Title  pt to be I with all HEP given as of last visit to maintain and progress current level of function     Time  6    Period  Weeks    Status  New    Target Date  08/02/18            Plan - 06/26/18 1450    Clinical Impression Statement  HEP reviewed with pt for technique. Pt had complaints on N/T in bilat UE pre tx that decreased after session. Pt had good response to MT for Cerv-T spine mobs, S.O. release and manual traction. She was then treated with ice and TENS to further decrease pain and inflammation. Pt may further benefit from mechanical traction.    Rehab Potential  Good    PT Frequency  2x / week    PT Duration  6 weeks    PT Treatment/Interventions  ADLs/Self Care Home Management;Cryotherapy;Electrical Stimulation;Iontophoresis 4mg /ml Dexamethasone;Moist Heat;Traction;Ultrasound;Therapeutic activities;Therapeutic exercise;Manual techniques;Taping;Vasopneumatic Device    PT Next Visit Plan  cerv  ROM, cervical mobs, upper trap STW, posture education,  could trial mechanical traction     PT Home Exercise Plan  upper trap stretch, chin tuck head lift, upper cervical rotation, thoracic rotation    Consulted and Agree with Plan of Care  Patient       Patient will benefit from skilled therapeutic intervention in order to improve the following deficits and impairments:  Pain, Decreased activity tolerance, Decreased endurance, Postural dysfunction, Improper body mechanics, Increased fascial restricitons, Decreased strength, Decreased range of motion, Impaired UE functional use  Visit  Diagnosis: Other disturbances of skin sensation  Cervicalgia  Other muscle spasm     Problem List Patient Active Problem List   Diagnosis Date Noted  . Shoulder impingement syndrome, left 12/19/2017  . Scoliosis (and kyphoscoliosis), idiopathic 07/01/2017  . Chronic bilateral back pain 07/01/2017  . Nexplanon in place 07/29/2016  . Cardiomegaly 07/01/2016  . History of aortic aneurysm 07/01/2016  . GERD (gastroesophageal reflux disease) 01/23/2016  . Dyspnea 10/27/2015  . Costochondritis 08/26/2015  . Obesity 07/01/2015  . Upper airway cough syndrome 06/30/2015  . History of shingles 01/26/2015  . Family history of colon cancer 10/27/2011  . Essential hypertension 10/27/2011    Debbe Odea, PT,DPT 06/26/2018, 2:55 PM  [facility] [address] [place], [place], [address] Phone: [phone]   Fax:  [phone]  Name: Megan Lang MRN: [id number] Date of Birth: [birth year]

## 2018-06-27 ENCOUNTER — Other Ambulatory Visit: Payer: Self-pay

## 2018-06-27 ENCOUNTER — Encounter: Payer: Self-pay | Admitting: Physician Assistant

## 2018-06-27 ENCOUNTER — Ambulatory Visit (INDEPENDENT_AMBULATORY_CARE_PROVIDER_SITE_OTHER): Payer: BC Managed Care – PPO | Admitting: Physician Assistant

## 2018-06-27 VITALS — BP 134/88 | HR 93 | Temp 99.0°F | Resp 18 | Ht 63.0 in | Wt 179.8 lb

## 2018-06-27 DIAGNOSIS — I10 Essential (primary) hypertension: Secondary | ICD-10-CM

## 2018-06-27 DIAGNOSIS — M79605 Pain in left leg: Secondary | ICD-10-CM

## 2018-06-27 DIAGNOSIS — Z23 Encounter for immunization: Secondary | ICD-10-CM | POA: Diagnosis not present

## 2018-06-27 MED ORDER — CEPHALEXIN 500 MG PO CAPS: 500.0000 mg | ORAL_CAPSULE | Freq: Three times a day (TID) | ORAL | 0 refills | Status: DC

## 2018-06-27 MED ORDER — BISOPROLOL-HYDROCHLOROTHIAZIDE 5-6.25 MG PO TABS: 1.0000 | ORAL_TABLET | Freq: Every day | ORAL | 1 refills | Status: DC

## 2018-06-27 MED ORDER — AMLODIPINE BESYLATE 5 MG PO TABS: 5.0000 mg | ORAL_TABLET | Freq: Every day | ORAL | 1 refills | Status: DC

## 2018-06-27 MED ORDER — MELOXICAM 15 MG PO TABS: 15.0000 mg | ORAL_TABLET | Freq: Every day | ORAL | 1 refills | Status: DC

## 2018-06-27 NOTE — Patient Instructions (Addendum)
  Heat or ice - whichever makes it feel better -  Rest and elevated  If you get worse we will need to see you again - if you are not getting better after the abx   If you have lab work done today you will be contacted with your lab results within the next 2 weeks.  If you have not heard from [place] then please contact us. The fastest way to get your results is to register for My Chart.   IF you received an x-ray today, you will receive an invoice from [facility]. Please contact [facility] at [phone] with questions or concerns regarding your invoice.   IF you received labwork today, you will receive an invoice from [facility]. Please contact [facility] at [phone] with questions or concerns regarding your invoice.   Our billing staff will not be able to assist you with questions regarding bills from these companies.  You will be contacted with the lab results as soon as they are available. The fastest way to get your results is to activate your My Chart account. Instructions are located on the last page of this paperwork. If you have not heard from [place] regarding the results in 2 weeks, please contact this office.

## 2018-06-27 NOTE — Progress Notes (Signed)
Megan Lang  MRN: [id number] DOB: Nov 26, 1970  PCP: Shawnee Knapp, MD  Chief Complaint  Patient presents with  . Leg Pain    left leg hurts when she gets up and thinks she has some swelling   . Medication Refill    needs BP meds     Subjective:  Pt presents to clinic for left leg pain that starts in her ankle and then up the lateral aspect of her lower leg.  It has been bothering her for a few days.  No injury that she remembers.  No change in her paresthesias since her pain started.  No rash on her leg.  The pain stabbing pain.  It is worse in the am when she wakes up and then starts to stand. Then she will get out of chair she gets same type of pain.  The pain gets better the more that she walks.  She has been Aleve 1 pill a day for her other pain but it has not helped with her pain.    Cervical spinal stenosis is a new diagnosis for her - seeing ortho - and now she has been referred to the spine surgeon.  Their plan is to do injections and then if that does not work surgery is her next option. Prednisone taper a couple of weeks ago.  She is on flexeril at night.   History is obtained by patient.  Review of Systems  Constitutional: Negative for chills and fever.  Eyes: Negative for visual disturbance.  Respiratory: Negative for shortness of breath.   Cardiovascular: Negative for chest pain, palpitations and leg swelling.  Neurological: Negative for dizziness, light-headedness and headaches.    Patient Active Problem List   Diagnosis Date Noted  . Shoulder impingement syndrome, left 12/19/2017  . Scoliosis (and kyphoscoliosis), idiopathic 07/01/2017  . Chronic bilateral back pain 07/01/2017  . Nexplanon in place 07/29/2016  . Cardiomegaly 07/01/2016  . History of aortic aneurysm 07/01/2016  . GERD (gastroesophageal reflux disease) 01/23/2016  . Dyspnea 10/27/2015  . Costochondritis 08/26/2015  . Obesity 07/01/2015  . Upper airway cough syndrome 06/30/2015  . History of  shingles 01/26/2015  . Family history of colon cancer 10/27/2011  . Essential hypertension 10/27/2011    Current Outpatient Medications on File Prior to Visit  Medication Sig Dispense Refill  . acetaminophen (TYLENOL) 500 MG tablet Take 500 mg by mouth every 4 (four) hours as needed for moderate pain.     Marland Kitchen albuterol (PROVENTIL HFA;VENTOLIN HFA) 108 (90 Base) MCG/ACT inhaler Inhale 1-2 puffs into the lungs every 6 (six) hours as needed for wheezing or shortness of breath. 1 Inhaler 0  . azelastine (ASTELIN) 0.1 % nasal spray Place 2 sprays into both nostrils 2 (two) times daily. Use in each nostril as directed (Patient not taking: Reported on 06/30/2018) 30 mL 0  . cyclobenzaprine (FLEXERIL) 10 MG tablet Take 0.5-1 tablets (5-10 mg total) by mouth 3 (three) times daily as needed. (Patient taking differently: Take 10-20 mg by mouth 3 (three) times daily as needed for muscle spasms. ) 30 tablet 0  . etonogestrel (NEXPLANON) 68 MG IMPL implant 1 each by Subdermal route once.    . fluticasone (FLONASE) 50 MCG/ACT nasal spray Place 1 spray into the nose daily as needed for allergies.     . naproxen (NAPROSYN) 500 MG tablet Take 1 tablet (500 mg total) by mouth 2 (two) times daily with a meal. 30 tablet 0  . ranitidine (ZANTAC) 150 MG  capsule Take 150 mg by mouth daily as needed for heartburn.      No current facility-administered medications on file prior to visit.     Allergies  Allergen Reactions  . Moxifloxacin Swelling    other  . Clindamycin Diarrhea and Other (See Comments)    GI upset  . Codeine Other (See Comments)    TACHYCARDIA   . Levofloxacin Other (See Comments)    other  . Metronidazole Diarrhea  . Minocin [Minocycline Hcl] Other (See Comments)    Stomach Pains  . Penicillins Rash    Has patient had a PCN reaction causing immediate rash, facial/tongue/throat swelling, SOB or lightheadedness with hypotension:yes Has patient had a PCN reaction causing severe rash involving  mucus membranes or skin necrosis:no Has patient had a PCN reaction that required hospitalization:no Has patient had a PCN reaction occurring within the last 10 years:No If all of the above answers are "NO", then may proceed with Cephalosporin    Past Medical History:  Diagnosis Date  . Acid reflux   . Allergy   . Aortic aneurysm ([place])   . Asthma   . Cardiomegaly   . Carpal tunnel syndrome   . Gastric polyp   . GERD (gastroesophageal reflux disease) 01/23/2016  . Hiatal hernia   . Hypertension   . Normal spontaneous vaginal delivery    3   Social History   Social History Narrative   Epworth Sleepiness Scale = 8 (as of 10/28/2015)   Social History   Tobacco Use  . Smoking status: Never Smoker  . Smokeless tobacco: Never Used  Substance Use Topics  . Alcohol use: No    Alcohol/week: 0.0 standard drinks  . Drug use: No   family history includes Breast cancer in her maternal grandmother; Hypertension in her brother, mother, and sister; Kidney disease in her father; Liver disease in her father.     Objective:  BP 134/88   Pulse 93   Temp 99 F (37.2 C) (Oral)   Resp 18   Ht 5\' 3"  (1.6 m)   Wt 179 lb 12.8 oz (81.6 kg)   SpO2 99%   BMI 31.85 kg/m  Body mass index is 31.85 kg/m.  Wt Readings from Last 3 Encounters:  06/30/18 180 lb (81.6 kg)  06/27/18 179 lb 12.8 oz (81.6 kg)  06/06/18 179 lb (81.2 kg)    Physical Exam  Constitutional: She is oriented to person, place, and time.  HENT:  Head: Normocephalic and atraumatic.  Right Ear: Hearing and external ear normal.  Left Ear: Hearing and external ear normal.  Eyes: Conjunctivae are normal.  Neck: Normal range of motion.  Cardiovascular: Normal rate, regular rhythm and normal heart sounds.  No murmur heard. Pulmonary/Chest: Effort normal and breath sounds normal. She has no wheezes.  Musculoskeletal:       Left hip: Normal.       Left knee: Normal.       Legs: Neurological: She is alert and oriented to  person, place, and time.  Skin: Skin is warm and dry.  Psychiatric: Judgment normal.  Vitals reviewed.   Assessment and Plan :  Left leg pain - Plan: meloxicam (MOBIC) 15 MG tablet, DISCONTINUED: cephALEXin (KEFLEX) 500 MG capsule  Essential hypertension - Plan: amLODipine (NORVASC) 5 MG tablet, bisoprolol-hydrochlorothiazide (ZIAC) 5-6.25 MG tablet  Flu vaccine need - Plan: Flu Vaccine QUAD 36+ mos IM   Refill blood pressure medications.  Had CMP on August 20 which was normal.  Patient verbalized  to me that they understand the following: diagnosis, what is being done for them, what to expect and what should be done at home.  Their questions have been answered.  Trial of Mobic to see if that will help her left leg pain.  Follow-up if worsens after antibiotics and if Mobic does not help.  See after visit summary for patient specific instructions.  Windell Hummingbird PA-C  [facility] 07/04/2018 9:03 PM  Please note: Portions of this report may have been transcribed using dragon voice recognition software. Every effort was made to ensure accuracy; however, inadvertent computerized transcription errors may be present.

## 2018-06-28 ENCOUNTER — Ambulatory Visit: Payer: BC Managed Care – PPO | Admitting: Physical Therapy

## 2018-06-28 DIAGNOSIS — R208 Other disturbances of skin sensation: Secondary | ICD-10-CM

## 2018-06-28 DIAGNOSIS — M62838 Other muscle spasm: Secondary | ICD-10-CM

## 2018-06-28 DIAGNOSIS — M542 Cervicalgia: Secondary | ICD-10-CM

## 2018-06-28 NOTE — Therapy (Signed)
[place], [place], [address] Phone: [phone]   Fax:  [phone]  Physical Therapy Treatment  Patient Details  Name: Megan Lang MRN: [id number] Date of Birth: [birth year] Referring Provider: Philis Fendt PA-C   Encounter Date: 06/28/2018  Megan Lang End of Session - 06/28/18 1355    Visit Number  3    Number of Visits  13    Date for Megan Lang Re-Evaluation  08/02/18    Authorization Type  BCBS    Megan Lang Start Time  1330    Megan Lang Stop Time  1415    Megan Lang Time Calculation (min)  45 min    Activity Tolerance  Patient tolerated treatment well    Behavior During Therapy  [facility] for tasks assessed/performed       Past Medical History:  Diagnosis Date  . Acid reflux   . Allergy   . Aortic aneurysm ([place])   . Asthma   . Cardiomegaly   . Carpal tunnel syndrome   . Gastric polyp   . GERD (gastroesophageal reflux disease) 01/23/2016  . Hiatal hernia   . Hypertension   . Normal spontaneous vaginal delivery    3    Past Surgical History:  Procedure Laterality Date  . CARPAL TUNNEL RELEASE  2009  . CARPAL TUNNEL RELEASE Left   . COLONOSCOPY    . NASAL SINUS SURGERY     DEC 15,2016  . ROTATOR CUFF REPAIR Left 04/14/2018    There were no vitals filed for this visit.  Subjective Assessment - 06/28/18 1348    Subjective  Megan Lang relays she still has N/T but this is improving some as it is more intermittent now    Currently in Pain?  Yes    Pain Score  2     Pain Location  Arm    Pain Orientation  Right;Left    Pain Descriptors / Indicators  Numbness;Tingling    Pain Type  Chronic pain                       [facility] Adult Megan Lang Treatment/Exercise - 06/28/18 0001      Exercises   Exercises  Shoulder;Neck      Neck Exercises: Seated   Neck Retraction  20 reps    Other Seated Exercise  thoracic rotation 2 x 10 bil with arms crossed      Neck Exercises: Supine   Neck Retraction  20 reps      Shoulder  Exercises: Seated   Row  20 reps      Shoulder Exercises: Pulleys   Flexion  2 minutes    ABduction  2 minutes      Shoulder Exercises: Stretch   Other Shoulder Stretches  upper trap 2 x 30 sec      Modalities   Modalities  Cryotherapy;Electrical Stimulation;Traction      Cryotherapy   Number Minutes Cryotherapy  12 Minutes    Cryotherapy Location  Cervical    Type of Cryotherapy  Ice pack      Electrical Stimulation   Electrical Stimulation Location  neck    Electrical Stimulation Action  IFC 12 min   Electrical Stimulation Parameters  tolerance    Electrical Stimulation Goals  Pain;Tone      Traction   Type of Traction  Cervical    Min (lbs)  5    Max (lbs)  10    Time  17  Megan Lang Education - 06/28/18 1355    Education Details  mechanical tx treatment and rationale    Methods  Explanation    Comprehension  Verbalized understanding       Megan Lang Short Term Goals - 06/21/18 1159      Megan Lang SHORT TERM GOAL #1   Title  Megan Lang to be I with inital HEP     Time  3    Period  Weeks    Status  New    Target Date  07/12/18      Megan Lang SHORT TERM GOAL #2   Title  Megan Lang to verbalize/ demo proper posture and lifting mechanics to prevent and reduce neck pain    Time  3    Period  Weeks    Status  New    Target Date  07/12/18      Megan Lang SHORT TERM GOAL #3   Title  Megan Lang to demonstrate decreased upper trap spasm bil to decrease pain to </= 4/10 and promote cervical mobility     Time  3    Period  Weeks    Status  New    Target Date  07/12/18        Megan Lang Long Term Goals - 06/21/18 1302      Megan Lang LONG TERM GOAL #1   Title  Megan Lang to incrase cervical flexion/ exension by >/ 8 degrees and sidebending/ rotation by >/= 10 degrees with <= 2/10 pain or report of referral symptoms for safety with driving     Time  6    Period  Weeks    Status  New    Target Date  08/02/18      Megan Lang LONG TERM GOAL #2   Title  Megan Lang to report numbness/ tingling occurrence in the bil UE happening </= 1 x a  week to promote improvement in condition    Time  6    Period  Weeks    Status  New    Target Date  08/02/18      Megan Lang LONG TERM GOAL #3   Title  Megan Lang to return to normal work related activities lifting/ pushing/ pulling as allowed for LUE (RCR) with </=2/10 and no report of N/T in bil UE    Time  6    Period  Weeks    Status  New    Target Date  08/02/18      Megan Lang LONG TERM GOAL #4   Title  Megan Lang to improve FOTO score to </= 35% limited to demo improvement in function     Time  6    Period  Weeks    Status  New    Target Date  08/02/18      Megan Lang LONG TERM GOAL #5   Title  Megan Lang to be I with all HEP given as of last visit to maintain and progress current level of function     Time  6    Period  Weeks    Status  New    Target Date  08/02/18            Plan - 06/28/18 1356    Clinical Impression Statement  Megan Lang trialed with mechanical traction this visit as she has responded well to manual traction previously. She was also treated with threx for cervical stretching and mobility followed by ice and tens to further reduce pain and inflamation. Megan Lang is still having radiculopathy but it appears to be improving some.  Rehab Potential  Good    Megan Lang Frequency  2x / week    Megan Lang Duration  6 weeks    Megan Lang Next Visit Plan  cerv ROM, cervical mobs, upper trap STW, posture education, assess response to traction and may inc lbs   Consulted and Agree with Plan of Care  Patient       Patient will benefit from skilled therapeutic intervention in order to improve the following deficits and impairments:  Pain, Decreased activity tolerance, Decreased endurance, Postural dysfunction, Improper body mechanics, Increased fascial restricitons, Decreased strength, Decreased range of motion, Impaired UE functional use  Visit Diagnosis: Cervicalgia  Other muscle spasm  Other disturbances of skin sensation     Problem List Patient Active Problem List   Diagnosis Date Noted  . Shoulder impingement syndrome,  left 12/19/2017  . Scoliosis (and kyphoscoliosis), idiopathic 07/01/2017  . Chronic bilateral back pain 07/01/2017  . Nexplanon in place 07/29/2016  . Cardiomegaly 07/01/2016  . History of aortic aneurysm 07/01/2016  . GERD (gastroesophageal reflux disease) 01/23/2016  . Dyspnea 10/27/2015  . Costochondritis 08/26/2015  . Obesity 07/01/2015  . Upper airway cough syndrome 06/30/2015  . History of shingles 01/26/2015  . Family history of colon cancer 10/27/2011  . Essential hypertension 10/27/2011    Megan Lang, Megan Lang, Megan Lang 06/28/2018, 3:41 PM  [facility] [address] [place], [place], [address] Phone: [phone]   Fax:  [phone]  Name: Megan Lang MRN: [id number] Date of Birth: 02-Oct-1971

## 2018-06-30 ENCOUNTER — Ambulatory Visit (INDEPENDENT_AMBULATORY_CARE_PROVIDER_SITE_OTHER): Payer: BC Managed Care – PPO | Admitting: Physician Assistant

## 2018-06-30 ENCOUNTER — Encounter: Payer: Self-pay | Admitting: Physician Assistant

## 2018-06-30 ENCOUNTER — Other Ambulatory Visit: Payer: Self-pay

## 2018-06-30 VITALS — BP 132/92 | HR 97 | Temp 98.7°F | Resp 16 | Ht 63.0 in | Wt 180.0 lb

## 2018-06-30 DIAGNOSIS — N898 Other specified noninflammatory disorders of vagina: Secondary | ICD-10-CM | POA: Diagnosis not present

## 2018-06-30 DIAGNOSIS — N760 Acute vaginitis: Secondary | ICD-10-CM

## 2018-06-30 DIAGNOSIS — N907 Vulvar cyst: Secondary | ICD-10-CM | POA: Diagnosis not present

## 2018-06-30 DIAGNOSIS — B9689 Other specified bacterial agents as the cause of diseases classified elsewhere: Secondary | ICD-10-CM | POA: Diagnosis not present

## 2018-06-30 LAB — POCT WET + KOH PREP
Trich by wet prep: ABSENT
Yeast by KOH: ABSENT
Yeast by wet prep: ABSENT

## 2018-06-30 MED ORDER — METRONIDAZOLE 0.75 % EX GEL: CUTANEOUS | 0 refills | Status: DC

## 2018-06-30 NOTE — Progress Notes (Signed)
Megan Lang  MRN: [id number] DOB: 1971-03-21  PCP: Shawnee Knapp, MD  Subjective:  Pt is a 47 year old female who presents to clinic for possible cyst in her groin. She can feel it when she is wiping. She has had something similar to this one time when she had a yeast infection, and it went away with diflucan.  Denies pain, drainage, swelling, increased warmth or pressure.   Review of Systems  Genitourinary: Negative for dysuria, frequency, pelvic pain, urgency, vaginal bleeding, vaginal discharge and vaginal pain.  Skin: Positive for rash.    Patient Active Problem List   Diagnosis Date Noted  . Shoulder impingement syndrome, left 12/19/2017  . Scoliosis (and kyphoscoliosis), idiopathic 07/01/2017  . Chronic bilateral back pain 07/01/2017  . Nexplanon in place 07/29/2016  . Cardiomegaly 07/01/2016  . History of aortic aneurysm 07/01/2016  . GERD (gastroesophageal reflux disease) 01/23/2016  . Dyspnea 10/27/2015  . Costochondritis 08/26/2015  . Obesity 07/01/2015  . Upper airway cough syndrome 06/30/2015  . History of shingles 01/26/2015  . Family history of colon cancer 10/27/2011  . Essential hypertension 10/27/2011    Current Outpatient Medications on File Prior to Visit  Medication Sig Dispense Refill  . acetaminophen (TYLENOL) 500 MG tablet Take 500 mg by mouth every 4 (four) hours as needed for moderate pain.     Marland Kitchen albuterol (PROVENTIL HFA;VENTOLIN HFA) 108 (90 Base) MCG/ACT inhaler Inhale 1-2 puffs into the lungs every 6 (six) hours as needed for wheezing or shortness of breath. 1 Inhaler 0  . amLODipine (NORVASC) 5 MG tablet Take 1 tablet (5 mg total) by mouth daily. 90 tablet 1  . bisoprolol-hydrochlorothiazide (ZIAC) 5-6.25 MG tablet Take 1 tablet by mouth daily. 90 tablet 1  . cyclobenzaprine (FLEXERIL) 10 MG tablet Take 0.5-1 tablets (5-10 mg total) by mouth 3 (three) times daily as needed. (Patient taking differently: Take 10-20 mg by mouth 3 (three) times  daily as needed for muscle spasms. ) 30 tablet 0  . etonogestrel (NEXPLANON) 68 MG IMPL implant 1 each by Subdermal route once.    . fluticasone (FLONASE) 50 MCG/ACT nasal spray Place 1 spray into the nose daily as needed for allergies.     . meloxicam (MOBIC) 15 MG tablet Take 1 tablet (15 mg total) by mouth daily. 30 tablet 1  . naproxen (NAPROSYN) 500 MG tablet Take 1 tablet (500 mg total) by mouth 2 (two) times daily with a meal. 30 tablet 0  . ranitidine (ZANTAC) 150 MG capsule Take 150 mg by mouth daily as needed for heartburn.     Marland Kitchen azelastine (ASTELIN) 0.1 % nasal spray Place 2 sprays into both nostrils 2 (two) times daily. Use in each nostril as directed (Patient not taking: Reported on 06/30/2018) 30 mL 0   No current facility-administered medications on file prior to visit.     Allergies  Allergen Reactions  . Moxifloxacin Swelling    other  . Clindamycin Diarrhea and Other (See Comments)    GI upset  . Codeine Other (See Comments)    TACHYCARDIA   . Levofloxacin Other (See Comments)    other  . Metronidazole Diarrhea  . Minocin [Minocycline Hcl] Other (See Comments)    Stomach Pains  . Penicillins Rash    Has patient had a PCN reaction causing immediate rash, facial/tongue/throat swelling, SOB or lightheadedness with hypotension:yes Has patient had a PCN reaction causing severe rash involving mucus membranes or skin necrosis:no Has patient had a PCN  reaction that required hospitalization:no Has patient had a PCN reaction occurring within the last 10 years:No If all of the above answers are "NO", then may proceed with Cephalosporin     Objective:  BP (!) 132/92 (BP Location: Left Arm, Patient Position: Sitting, Cuff Size: Normal)   Pulse 97   Temp 98.7 F (37.1 C) (Oral)   Resp 16   Ht 5\' 3"  (1.6 m)   Wt 180 lb (81.6 kg)   SpO2 99%   BMI 31.89 kg/m   Physical Exam  Constitutional: She appears well-developed and well-nourished.  Genitourinary:    Pelvic exam  was performed with patient supine. There is no tenderness on the right labia. There is no tenderness on the left labia. Cervix exhibits no discharge. Vaginal discharge (thin, white) found.  Skin: Skin is warm and dry.  Psychiatric: She has a normal mood and affect. Her behavior is normal. Judgment and thought content normal.  Vitals reviewed.   Results for orders placed or performed in visit on 06/30/18  POCT Wet + KOH Prep  Result Value Ref Range   Yeast by KOH Absent Absent   Yeast by wet prep Absent Absent   WBC by wet prep Moderate (A) Few   Clue Cells Wet Prep HPF POC Few (A) None   Trich by wet prep Absent Absent   Bacteria Wet Prep HPF POC Many (A) Few   Epithelial Cells By [employer] Pref ([facility]) Many (A) None, Few, Too numerous to count   RBC,UR,HPF,POC None None RBC/hpf    Assessment and Plan :  1. BV (bacterial vaginosis) 2. Vaginal discharge - Positive clue cells on wet prep, plan to treat with flagyl. rtc if no improvement.  - metroNIDAZOLE (METROGEL) 0.75 % gel; Apply one full applicator intravaginally at night before bed for 5 days  Dispense: 45 g; Refill: 0 - POCT Wet + KOH Prep  3. Labial cyst - Clinically improving. No need for I&D today. Will watch. Advised warm baths as needed. RTC if symptoms worsen.    Mercer Pod, PA-C  [facility] 06/30/2018 9:20 AM  Please note: Portions of this report may have been transcribed using dragon voice recognition software. Every effort was made to ensure accuracy; however, inadvertent computerized transcription errors may be present.

## 2018-06-30 NOTE — Patient Instructions (Addendum)
I am treating you today for bacterial vaginosis.  Apply one full applicator of metrogel intravaginally at night before bed for 5 days     Bacterial Vaginosis Bacterial vaginosis is an infection of the vagina. It happens when too many germs (bacteria) grow in the vagina. This infection puts you at risk for infections from sex (STIs). Treating this infection can lower your risk for some STIs. You should also treat this if you are pregnant. It can cause your baby to be born early. Follow these instructions at home: Medicines  Take over-the-counter and prescription medicines only as told by your doctor.  Take or use your antibiotic medicine as told by your doctor. Do not stop taking or using it even if you start to feel better. General instructions  If you your sexual partner is a woman, tell her that you have this infection. She needs to get treatment if she has symptoms. If you have a female partner, he does not need to be treated.  During treatment: ? Avoid sex. ? Do not douche. ? Avoid alcohol as told. ? Avoid breastfeeding as told.  Drink enough fluid to keep your pee (urine) clear or pale yellow.  Keep your vagina and butt (rectum) clean. ? Wash the area with warm water every day. ? Wipe from front to back after you use the toilet.  Keep all follow-up visits as told by your doctor. This is important. Preventing this condition  Do not douche.  Use only warm water to wash around your vagina.  Use protection when you have sex. This includes: ? Latex condoms. ? Dental dams.  Limit how many people you have sex with. It is best to only have sex with the same person (be monogamous).  Get tested for STIs. Have your partner get tested.  Wear underwear that is cotton or lined with cotton.  Avoid tight pants and pantyhose. This is most important in summer.  Do not use any products that have nicotine or tobacco in them. These include cigarettes and e-cigarettes. If you need help  quitting, ask your doctor.  Do not use illegal drugs.  Limit how much alcohol you drink. Contact a doctor if:  Your symptoms do not get better, even after you are treated.  You have more discharge or pain when you pee (urinate).  You have a fever.  You have pain in your belly (abdomen).  You have pain with sex.  Your bleed from your vagina between periods. Summary  This infection happens when too many germs (bacteria) grow in the vagina.  Treating this condition can lower your risk for some infections from sex (STIs).  You should also treat this if you are pregnant. It can cause early (premature) birth.  Do not stop taking or using your antibiotic medicine even if you start to feel better. This information is not intended to replace advice given to you by your health care provider. Make sure you discuss any questions you have with your health care provider. Document Released: 07/13/2008 Document Revised: 06/19/2016 Document Reviewed: 06/19/2016 Elsevier Interactive Patient Education  2017 [employer].   IF you received an x-ray today, you will receive an invoice from [facility]. Please contact [facility] at [phone] with questions or concerns regarding your invoice.   IF you received labwork today, you will receive an invoice from [facility]. Please contact [facility] at [phone] with questions or concerns regarding your invoice.   Our billing staff will not be able to assist you with  questions regarding bills from these companies.  You will be contacted with the lab results as soon as they are available. The fastest way to get your results is to activate your My Chart account. Instructions are located on the last page of this paperwork. If you have not heard from [place] regarding the results in 2 weeks, please contact this office.

## 2018-07-03 ENCOUNTER — Ambulatory Visit: Payer: BC Managed Care – PPO | Admitting: Physical Therapy

## 2018-07-03 ENCOUNTER — Encounter: Payer: Self-pay | Admitting: Physical Therapy

## 2018-07-03 DIAGNOSIS — R208 Other disturbances of skin sensation: Secondary | ICD-10-CM

## 2018-07-03 DIAGNOSIS — M542 Cervicalgia: Secondary | ICD-10-CM

## 2018-07-03 NOTE — Therapy (Signed)
[facility] [place], [place], [address] Phone: [phone]   Fax:  [phone]  Physical Therapy Treatment  Patient Details  Name: Megan Lang MRN: [id number] Date of Birth: [birth year] Referring Provider: Philis Fendt PA-C   Encounter Date: 07/03/2018  PT End of Session - 07/03/18 1024    Visit Number  4    Number of Visits  13    Date for PT Re-Evaluation  08/02/18    Authorization Type  BCBS    PT Start Time  1018    PT Stop Time  1115    PT Time Calculation (min)  57 min       Past Medical History:  Diagnosis Date  . Acid reflux   . Allergy   . Aortic aneurysm ([place])   . Asthma   . Cardiomegaly   . Carpal tunnel syndrome   . Gastric polyp   . GERD (gastroesophageal reflux disease) 01/23/2016  . Hiatal hernia   . Hypertension   . Normal spontaneous vaginal delivery    3    Past Surgical History:  Procedure Laterality Date  . CARPAL TUNNEL RELEASE  2009  . CARPAL TUNNEL RELEASE Left   . COLONOSCOPY    . NASAL SINUS SURGERY     DEC 15,2016  . ROTATOR CUFF REPAIR Left 04/14/2018    There were no vitals filed for this visit.  Subjective Assessment - 07/03/18 1020    Subjective  It's been rough. I noticed less intense symptoms in my arms but they are still there. Feels warm and like something is crawling on me sometimes. Right now arms feel warm. Both legs feels warm too.     Currently in Pain?  Yes    Pain Score  3     Pain Location  Arm    Pain Orientation  Right;Left    Pain Descriptors / Indicators  --   warm and tingling, crawling   Pain Type  Chronic pain    Aggravating Factors   laying down, moving the head    Pain Relieving Factors  chin tucks, heating, pad                        [facility] Adult PT Treatment/Exercise - 07/03/18 0001      Exercises   Exercises  Shoulder      Neck Exercises: Seated   Neck Retraction  20 reps    Other Seated Exercise  Posture  education; pt requires tactile cues to achieve proper posture       Shoulder Exercises: Seated   Row  10 reps    Theraband Level (Shoulder Row)  Level 1 (Yellow)    Horizontal ABduction  10 reps    Theraband Level (Shoulder Horizontal ABduction)  Level 1 (Yellow)    Other Seated Exercises  cervical AROM retation    Other Seated Exercises  scap retract  x 10       Modalities   Modalities  Traction      Traction   Type of Traction  Cervical    Min (lbs)  6    Max (lbs)  12    Hold Time  60    Rest Time  10    Time  17      Manual Therapy   Manual therapy comments  upper trap soft tissue work bilateral       Neck Exercises: Stretches   Upper Trapezius Stretch  Right;Left;2 reps;30 seconds               PT Short Term Goals - 06/21/18 1159      PT SHORT TERM GOAL #1   Title  pt to be I with inital HEP     Time  3    Period  Weeks    Status  New    Target Date  07/12/18      PT SHORT TERM GOAL #2   Title  pt to verbalize/ demo proper posture and lifting mechanics to prevent and reduce neck pain    Time  3    Period  Weeks    Status  New    Target Date  07/12/18      PT SHORT TERM GOAL #3   Title  pt to demonstrate decreased upper trap spasm bil to decrease pain to </= 4/10 and promote cervical mobility     Time  3    Period  Weeks    Status  New    Target Date  07/12/18        PT Long Term Goals - 06/21/18 1302      PT LONG TERM GOAL #1   Title  pt to incrase cervical flexion/ exension by >/ 8 degrees and sidebending/ rotation by >/= 10 degrees with <= 2/10 pain or report of referral symptoms for safety with driving     Time  6    Period  Weeks    Status  New    Target Date  08/02/18      PT LONG TERM GOAL #2   Title  pt to report numbness/ tingling occurrence in the bil UE happening </= 1 x a week to promote improvement in condition    Time  6    Period  Weeks    Status  New    Target Date  08/02/18      PT LONG TERM GOAL #3   Title  pt to  return to normal work related activities lifting/ pushing/ pulling as allowed for LUE (RCR) with </=2/10 and no report of N/T in bil UE    Time  6    Period  Weeks    Status  New    Target Date  08/02/18      PT LONG TERM GOAL #4   Title  pt to improve FOTO score to </= 35% limited to demo improvement in function     Time  6    Period  Weeks    Status  New    Target Date  08/02/18      PT LONG TERM GOAL #5   Title  pt to be I with all HEP given as of last visit to maintain and progress current level of function     Time  6    Period  Weeks    Status  New    Target Date  08/02/18            Plan - 07/03/18 1030    Clinical Impression Statement  Pt reports less intense UE symptoms however still present. She felt "cool" in arms after last visit then warmness returned. Repeated cervical traction and began upper trap soft tissue work for progression toward goals. At end of session she reported a small increase in neck pain from neck cradle.     PT Next Visit Plan  Pt is under care at [facility] for post op left shoulder RTC  repair done in late June; cerv ROM, cervical mobs, upper trap STW, posture education, assess response to traction     PT Home Exercise Plan  upper trap stretch, chin tuck head lift, upper cervical rotation, thoracic rotation    Consulted and Agree with Plan of Care  Patient       Patient will benefit from skilled therapeutic intervention in order to improve the following deficits and impairments:  Pain, Decreased activity tolerance, Decreased endurance, Postural dysfunction, Improper body mechanics, Increased fascial restricitons, Decreased strength, Decreased range of motion, Impaired UE functional use  Visit Diagnosis: Cervicalgia  Other disturbances of skin sensation     Problem List Patient Active Problem List   Diagnosis Date Noted  . Shoulder impingement syndrome, left 12/19/2017  . Scoliosis (and kyphoscoliosis), idiopathic 07/01/2017  . Chronic  bilateral back pain 07/01/2017  . Nexplanon in place 07/29/2016  . Cardiomegaly 07/01/2016  . History of aortic aneurysm 07/01/2016  . GERD (gastroesophageal reflux disease) 01/23/2016  . Dyspnea 10/27/2015  . Costochondritis 08/26/2015  . Obesity 07/01/2015  . Upper airway cough syndrome 06/30/2015  . History of shingles 01/26/2015  . Family history of colon cancer 10/27/2011  . Essential hypertension 10/27/2011    Dorene Ar, PTA 07/03/2018, 11:16 AM  [facility] [address] [place], [place], [address] Phone: [phone]   Fax:  [phone]  Name: ASIANA BENNINGER MRN: [id number] Date of Birth: Mar 04, 1971

## 2018-07-05 ENCOUNTER — Ambulatory Visit: Payer: BC Managed Care – PPO | Admitting: Physical Therapy

## 2018-07-05 DIAGNOSIS — M542 Cervicalgia: Secondary | ICD-10-CM

## 2018-07-05 DIAGNOSIS — R208 Other disturbances of skin sensation: Secondary | ICD-10-CM

## 2018-07-05 DIAGNOSIS — M62838 Other muscle spasm: Secondary | ICD-10-CM

## 2018-07-05 NOTE — Therapy (Signed)
[place], [place], [address] Phone: [phone]   Fax:  [phone]  Physical Therapy Treatment  Patient Details  Name: Megan Lang MRN: [id number] Date of Birth: Dec 18, 1970 Referring Provider: Beaulah Corin   Encounter Date: 07/05/2018  PT End of Session - 07/05/18 1218    Visit Number  5    Number of Visits  13    Date for PT Re-Evaluation  08/02/18    Authorization Type  BCBS    PT Start Time  1150    PT Stop Time  1230    PT Time Calculation (min)  40 min    Activity Tolerance  Patient tolerated treatment well    Behavior During Therapy  [facility] for tasks assessed/performed       Past Medical History:  Diagnosis Date  . Acid reflux   . Allergy   . Aortic aneurysm ([place])   . Asthma   . Cardiomegaly   . Carpal tunnel syndrome   . Gastric polyp   . GERD (gastroesophageal reflux disease) 01/23/2016  . Hiatal hernia   . Hypertension   . Normal spontaneous vaginal delivery    3    Past Surgical History:  Procedure Laterality Date  . CARPAL TUNNEL RELEASE  2009  . CARPAL TUNNEL RELEASE Left   . COLONOSCOPY    . NASAL SINUS SURGERY     DEC 15,2016  . ROTATOR CUFF REPAIR Left 04/14/2018    There were no vitals filed for this visit.  Subjective Assessment - 07/05/18 1158    Subjective  Pt relays symptoms are maybe a little less intense but still theres, she still complains of hot/cold symptoms. She says she will have neck injections this saturday from Dr. Nelva Bush.    Currently in Pain?  Yes    Pain Score  4     Pain Location  Neck    Pain Orientation  Right;Left         [facility] PT Assessment - 07/05/18 0001      Assessment   Medical Diagnosis  bil UE parasthesia and pain    Referring Provider  Clark PA-C      AROM   Cervical Flexion  50    Cervical Extension  55    Cervical - Right Side Bend  35    Cervical - Left Side Bend  35    Cervical - Right Rotation  65    Cervical - Left  Rotation  60                   [facility] Adult PT Treatment/Exercise - 07/05/18 0001      Neck Exercises: Seated   Neck Retraction  20 reps      Shoulder Exercises: Seated   Row  15 reps    Theraband Level (Shoulder Row)  Level 1 (Yellow)    Other Seated Exercises  shoulder ext  x15 yellow      Modalities   Modalities  Traction      Traction   Type of Traction  Cervical    Min (lbs)  6    Max (lbs)  12    Time  17      Manual Therapy   Manual therapy comments  --      Neck Exercises: Stretches   Upper Trapezius Stretch  Right;Left;2 reps;30 seconds    Levator Stretch  Right;Left;2 reps;30 seconds  PT Short Term Goals - 06/21/18 1159      PT SHORT TERM GOAL #1   Title  pt to be I with inital HEP     Time  3    Period  Weeks    Status  New    Target Date  07/12/18      PT SHORT TERM GOAL #2   Title  pt to verbalize/ demo proper posture and lifting mechanics to prevent and reduce neck pain    Time  3    Period  Weeks    Status  New    Target Date  07/12/18      PT SHORT TERM GOAL #3   Title  pt to demonstrate decreased upper trap spasm bil to decrease pain to </= 4/10 and promote cervical mobility     Time  3    Period  Weeks    Status  New    Target Date  07/12/18        PT Long Term Goals - 06/21/18 1302      PT LONG TERM GOAL #1   Title  pt to incrase cervical flexion/ exension by >/ 8 degrees and sidebending/ rotation by >/= 10 degrees with <= 2/10 pain or report of referral symptoms for safety with driving     Time  6    Period  Weeks    Status  New    Target Date  08/02/18      PT LONG TERM GOAL #2   Title  pt to report numbness/ tingling occurrence in the bil UE happening </= 1 x a week to promote improvement in condition    Time  6    Period  Weeks    Status  New    Target Date  08/02/18      PT LONG TERM GOAL #3   Title  pt to return to normal work related activities lifting/ pushing/ pulling as allowed for  LUE (RCR) with </=2/10 and no report of N/T in bil UE    Time  6    Period  Weeks    Status  New    Target Date  08/02/18      PT LONG TERM GOAL #4   Title  pt to improve FOTO score to </= 35% limited to demo improvement in function     Time  6    Period  Weeks    Status  New    Target Date  08/02/18      PT LONG TERM GOAL #5   Title  pt to be I with all HEP given as of last visit to maintain and progress current level of function     Time  6    Period  Weeks    Status  New    Target Date  08/02/18            Plan - 07/05/18 1219    Clinical Impression Statement  Pt still appears to have radicular symptoms however these are lessening some with PT. Mechanical traction resumed as she feels it may be helping. Scapular strengthening and neck stretching resumed with good tolerance. She did have some improvments with neck ROM, see updated measurments.     Rehab Potential  Good    PT Frequency  2x / week    PT Duration  6 weeks    PT Treatment/Interventions  ADLs/Self Care Home Management;Cryotherapy;Electrical Stimulation;Iontophoresis 4mg /ml Dexamethasone;Moist Heat;Traction;Ultrasound;Therapeutic activities;Therapeutic exercise;Manual techniques;Taping;Vasopneumatic Device  PT Next Visit Plan  Pt to have neck injections sat, so assess her response to this.    Consulted and Agree with Plan of Care  Patient       Patient will benefit from skilled therapeutic intervention in order to improve the following deficits and impairments:  Pain, Decreased activity tolerance, Decreased endurance, Postural dysfunction, Improper body mechanics, Increased fascial restricitons, Decreased strength, Decreased range of motion, Impaired UE functional use  Visit Diagnosis: Cervicalgia  Other muscle spasm  Other disturbances of skin sensation     Problem List Patient Active Problem List   Diagnosis Date Noted  . Shoulder impingement syndrome, left 12/19/2017  . Scoliosis (and  kyphoscoliosis), idiopathic 07/01/2017  . Chronic bilateral back pain 07/01/2017  . Nexplanon in place 07/29/2016  . Cardiomegaly 07/01/2016  . History of aortic aneurysm 07/01/2016  . GERD (gastroesophageal reflux disease) 01/23/2016  . Dyspnea 10/27/2015  . Costochondritis 08/26/2015  . Obesity 07/01/2015  . Upper airway cough syndrome 06/30/2015  . History of shingles 01/26/2015  . Family history of colon cancer 10/27/2011  . Essential hypertension 10/27/2011    Debbe Odea, PT, DPT 07/05/2018, 1:42 PM  [facility] [address] [place], [place], [address] Phone: [phone]   Fax:  [phone]  Name: Megan Lang MRN: [id number] Date of Birth: 1971-04-24

## 2018-07-07 ENCOUNTER — Encounter: Payer: BC Managed Care – PPO | Admitting: Physical Therapy

## 2018-07-10 ENCOUNTER — Encounter: Payer: Self-pay | Admitting: Physical Therapy

## 2018-07-10 ENCOUNTER — Ambulatory Visit: Payer: BC Managed Care – PPO | Admitting: Physical Therapy

## 2018-07-10 DIAGNOSIS — R208 Other disturbances of skin sensation: Secondary | ICD-10-CM

## 2018-07-10 DIAGNOSIS — M542 Cervicalgia: Secondary | ICD-10-CM

## 2018-07-10 DIAGNOSIS — M62838 Other muscle spasm: Secondary | ICD-10-CM

## 2018-07-10 NOTE — Therapy (Signed)
[place], [place], [address] Phone: [phone]   Fax:  [phone]  Physical Therapy Treatment  Patient Details  Name: Megan Lang MRN: [id number] Date of Birth: 1971/04/10 Referring Provider: Beaulah Corin   Encounter Date: 07/10/2018  PT End of Session - 07/10/18 1146    Visit Number  6    Number of Visits  13    Date for PT Re-Evaluation  08/02/18    Authorization Type  BCBS    PT Start Time  1146    PT Stop Time  1230    PT Time Calculation (min)  44 min    Activity Tolerance  Patient tolerated treatment well    Behavior During Therapy  [facility] for tasks assessed/performed       Past Medical History:  Diagnosis Date  . Acid reflux   . Allergy   . Aortic aneurysm ([place])   . Asthma   . Cardiomegaly   . Carpal tunnel syndrome   . Gastric polyp   . GERD (gastroesophageal reflux disease) 01/23/2016  . Hiatal hernia   . Hypertension   . Normal spontaneous vaginal delivery    3    Past Surgical History:  Procedure Laterality Date  . CARPAL TUNNEL RELEASE  2009  . CARPAL TUNNEL RELEASE Left   . COLONOSCOPY    . NASAL SINUS SURGERY     DEC 15,2016  . ROTATOR CUFF REPAIR Left 04/14/2018    There were no vitals filed for this visit.  Subjective Assessment - 07/10/18 1146    Subjective  "I did get an epidural on Saturday and the physican said it would take a couple days for it to take some affect. I am still get the warm feeling and pins and needles off an on"     Currently in Pain?  Yes    Pain Score  2     Pain Orientation  Left;Right    Pain Type  Chronic pain    Pain Frequency  Intermittent    Aggravating Factors   moving the head     Pain Relieving Factors  chin tucks, heaing pad,                        [facility] Adult PT Treatment/Exercise - 07/10/18 1215      Neck Exercises: Seated   Other Seated Exercise  thoracic extension over back of chair with verbal cues to keep  chin tucked.    Other Seated Exercise  seated thoracic rotation 2 x 10 with arms crossed       Neck Exercises: Supine   Cervical Isometrics  Extension;10 reps;10 secs    Neck Retraction  10 reps;5 secs   chin tuck head lift     Manual Therapy   Manual Therapy  Joint mobilization;Neural Stretch    Manual therapy comments  MTPR along bil upper trap/ levator scapulae, and scalenes    Joint Mobilization  rib mobs grade 3 bil with pt focusing on breathing in and out   cavitation noted on the R during mob, pt reported relief    Neural Stretch  bil median nerve glides 1 x 10 bil      Neck Exercises: Stretches   Upper Trapezius Stretch  Right;Left;2 reps;30 seconds    Levator Stretch  Right;Left;2 reps;30 seconds             PT Education - 07/10/18 1213    Education  Details  updated HEP for sub-occipital release and rib mobs    Person(s) Educated  Patient    Methods  Explanation;Handout;Demonstration    Comprehension  Verbalized understanding;Verbal cues required;Returned demonstration       PT Short Term Goals - 06/21/18 1159      PT SHORT TERM GOAL #1   Title  pt to be I with inital HEP     Time  3    Period  Weeks    Status  New    Target Date  07/12/18      PT SHORT TERM GOAL #2   Title  pt to verbalize/ demo proper posture and lifting mechanics to prevent and reduce neck pain    Time  3    Period  Weeks    Status  New    Target Date  07/12/18      PT SHORT TERM GOAL #3   Title  pt to demonstrate decreased upper trap spasm bil to decrease pain to </= 4/10 and promote cervical mobility     Time  3    Period  Weeks    Status  New    Target Date  07/12/18        PT Long Term Goals - 06/21/18 1302      PT LONG TERM GOAL #1   Title  pt to incrase cervical flexion/ exension by >/ 8 degrees and sidebending/ rotation by >/= 10 degrees with <= 2/10 pain or report of referral symptoms for safety with driving     Time  6    Period  Weeks    Status  New    Target  Date  08/02/18      PT LONG TERM GOAL #2   Title  pt to report numbness/ tingling occurrence in the bil UE happening </= 1 x a week to promote improvement in condition    Time  6    Period  Weeks    Status  New    Target Date  08/02/18      PT LONG TERM GOAL #3   Title  pt to return to normal work related activities lifting/ pushing/ pulling as allowed for LUE (RCR) with </=2/10 and no report of N/T in bil UE    Time  6    Period  Weeks    Status  New    Target Date  08/02/18      PT LONG TERM GOAL #4   Title  pt to improve FOTO score to </= 35% limited to demo improvement in function     Time  6    Period  Weeks    Status  New    Target Date  08/02/18      PT LONG TERM GOAL #5   Title  pt to be I with all HEP given as of last visit to maintain and progress current level of function     Time  6    Period  Weeks    Status  New    Target Date  08/02/18            Plan - 07/10/18 1234    Clinical Impression Statement  pt reports she had received an epidural on Saturday and notes minimal  improvement since last session. continued STW and Rib mobilization which she reports signficiant improvement in her warming sensation. continued cervical stability exercisees and thoracic mobility. end of session she declined modalities.     PT  Treatment/Interventions  ADLs/Self Care Home Management;Cryotherapy;Electrical Stimulation;Iontophoresis 4mg /ml Dexamethasone;Moist Heat;Traction;Ultrasound;Therapeutic activities;Therapeutic exercise;Manual techniques;Taping;Vasopneumatic Device    PT Next Visit Plan  thoracic mobility, STW along upper trap/ scanlenes, rib mobs, cervical stability, traction PRN    PT Home Exercise Plan  upper trap stretch, chin tuck head lift, upper cervical rotation, thoracic rotation, rib mobs, sub-occipital release    Consulted and Agree with Plan of Care  Patient       Patient will benefit from skilled therapeutic intervention in order to improve the following  deficits and impairments:  Pain, Decreased activity tolerance, Decreased endurance, Postural dysfunction, Improper body mechanics, Increased fascial restricitons, Decreased strength, Decreased range of motion, Impaired UE functional use  Visit Diagnosis: Cervicalgia  Other muscle spasm  Other disturbances of skin sensation     Problem List Patient Active Problem List   Diagnosis Date Noted  . Shoulder impingement syndrome, left 12/19/2017  . Scoliosis (and kyphoscoliosis), idiopathic 07/01/2017  . Chronic bilateral back pain 07/01/2017  . Nexplanon in place 07/29/2016  . Cardiomegaly 07/01/2016  . History of aortic aneurysm 07/01/2016  . GERD (gastroesophageal reflux disease) 01/23/2016  . Dyspnea 10/27/2015  . Costochondritis 08/26/2015  . Obesity 07/01/2015  . Upper airway cough syndrome 06/30/2015  . History of shingles 01/26/2015  . Family history of colon cancer 10/27/2011  . Essential hypertension 10/27/2011   Starr Lake PT, DPT, LAT, ATC  07/10/18  12:38 PM      [facility] [address] [place], [place], [address] Phone: [phone]   Fax:  [phone]  Name: Megan Lang MRN: [id number] Date of Birth: [birth year]

## 2018-07-14 ENCOUNTER — Ambulatory Visit: Payer: BC Managed Care – PPO | Admitting: Physical Therapy

## 2018-07-14 DIAGNOSIS — M542 Cervicalgia: Secondary | ICD-10-CM

## 2018-07-14 DIAGNOSIS — M62838 Other muscle spasm: Secondary | ICD-10-CM

## 2018-07-14 DIAGNOSIS — R208 Other disturbances of skin sensation: Secondary | ICD-10-CM | POA: Diagnosis not present

## 2018-07-14 NOTE — Therapy (Signed)
[place], [place], [address] Phone: [phone]   Fax:  [phone]  Physical Therapy Treatment  Patient Details  Name: Megan Lang MRN: [id number] Date of Birth: October 02, 1971 Referring Provider (PT): Beaulah Corin   Encounter Date: 07/14/2018  PT End of Session - 07/14/18 1141    Visit Number  7    Number of Visits  13    Date for PT Re-Evaluation  08/02/18    Authorization Type  BCBS    PT Start Time  1105    PT Stop Time  1150    PT Time Calculation (min)  45 min    Activity Tolerance  Patient tolerated treatment well    Behavior During Therapy  [facility] for tasks assessed/performed       Past Medical History:  Diagnosis Date  . Acid reflux   . Allergy   . Aortic aneurysm ([place])   . Asthma   . Cardiomegaly   . Carpal tunnel syndrome   . Gastric polyp   . GERD (gastroesophageal reflux disease) 01/23/2016  . Hiatal hernia   . Hypertension   . Normal spontaneous vaginal delivery    3    Past Surgical History:  Procedure Laterality Date  . CARPAL TUNNEL RELEASE  2009  . CARPAL TUNNEL RELEASE Left   . COLONOSCOPY    . NASAL SINUS SURGERY     DEC 15,2016  . ROTATOR CUFF REPAIR Left 04/14/2018    There were no vitals filed for this visit.  Subjective Assessment - 07/14/18 1132    Subjective  Pt relays she has had more muscle spasms since getting injection. She relays radiculopathy is mild.     Currently in Pain?  Yes    Pain Score  2     Pain Location  Neck    Pain Orientation  Left;Right                       [facility] Adult PT Treatment/Exercise - 07/14/18 0001      Exercises   Exercises  Shoulder;Neck      Neck Exercises: Seated   Neck Retraction  20 reps    Other Seated Exercise  thoracic/romboid stretch and rotation 5 sec  X10    Other Seated Exercise  scap retraction X 20      Shoulder Exercises: Pulleys   Flexion  2 minutes      Modalities   Modalities  Moist  Heat;Traction      Moist Heat Therapy   Number Minutes Moist Heat  17 Minutes   while on traction   Moist Heat Location  Cervical   thoracic     Traction   Type of Traction  Cervical    Min (lbs)  7    Max (lbs)  13    Time  17      Neck Exercises: Stretches   Upper Trapezius Stretch  Right;Left;2 reps;30 seconds    Levator Stretch  Right;Left;2 reps;30 seconds               PT Short Term Goals - 06/21/18 1159      PT SHORT TERM GOAL #1   Title  pt to be I with inital HEP     Time  3    Period  Weeks    Status  New    Target Date  07/12/18      PT SHORT TERM GOAL #2  Title  pt to verbalize/ demo proper posture and lifting mechanics to prevent and reduce neck pain    Time  3    Period  Weeks    Status  New    Target Date  07/12/18      PT SHORT TERM GOAL #3   Title  pt to demonstrate decreased upper trap spasm bil to decrease pain to </= 4/10 and promote cervical mobility     Time  3    Period  Weeks    Status  New    Target Date  07/12/18        PT Long Term Goals - 06/21/18 1302      PT LONG TERM GOAL #1   Title  pt to incrase cervical flexion/ exension by >/ 8 degrees and sidebending/ rotation by >/= 10 degrees with <= 2/10 pain or report of referral symptoms for safety with driving     Time  6    Period  Weeks    Status  New    Target Date  08/02/18      PT LONG TERM GOAL #2   Title  pt to report numbness/ tingling occurrence in the bil UE happening </= 1 x a week to promote improvement in condition    Time  6    Period  Weeks    Status  New    Target Date  08/02/18      PT LONG TERM GOAL #3   Title  pt to return to normal work related activities lifting/ pushing/ pulling as allowed for LUE (RCR) with </=2/10 and no report of N/T in bil UE    Time  6    Period  Weeks    Status  New    Target Date  08/02/18      PT LONG TERM GOAL #4   Title  pt to improve FOTO score to </= 35% limited to demo improvement in function     Time  6     Period  Weeks    Status  New    Target Date  08/02/18      PT LONG TERM GOAL #5   Title  pt to be I with all HEP given as of last visit to maintain and progress current level of function     Time  6    Period  Weeks    Status  New    Target Date  08/02/18            Plan - 07/14/18 1142    Clinical Impression Statement  Pt feels the traction helps some so this was resumed today but with 1 lb increase to min-max levels and heat applied to lower cervical/upper thoracic. She had good tolerance to therex but did have some pain in scapula/rhomboid so she was shown rhomboid stretching.     Rehab Potential  Good    PT Frequency  2x / week    PT Duration  6 weeks    PT Treatment/Interventions  ADLs/Self Care Home Management;Cryotherapy;Electrical Stimulation;Iontophoresis 4mg /ml Dexamethasone;Moist Heat;Traction;Ultrasound;Therapeutic activities;Therapeutic exercise;Manual techniques;Taping;Vasopneumatic Device    PT Next Visit Plan  thoracic mobility, STW along upper trap/ scanlenes, rib mobs, cervical stability, traction PRN    PT Home Exercise Plan  upper trap stretch, chin tuck head lift, upper cervical rotation, thoracic rotation, rib mobs, sub-occipital release    Consulted and Agree with Plan of Care  Patient       Patient will benefit  from skilled therapeutic intervention in order to improve the following deficits and impairments:  Pain, Decreased activity tolerance, Decreased endurance, Postural dysfunction, Improper body mechanics, Increased fascial restricitons, Decreased strength, Decreased range of motion, Impaired UE functional use  Visit Diagnosis: Cervicalgia  Other muscle spasm     Problem List Patient Active Problem List   Diagnosis Date Noted  . Shoulder impingement syndrome, left 12/19/2017  . Scoliosis (and kyphoscoliosis), idiopathic 07/01/2017  . Chronic bilateral back pain 07/01/2017  . Nexplanon in place 07/29/2016  . Cardiomegaly 07/01/2016  . History  of aortic aneurysm 07/01/2016  . GERD (gastroesophageal reflux disease) 01/23/2016  . Dyspnea 10/27/2015  . Costochondritis 08/26/2015  . Obesity 07/01/2015  . Upper airway cough syndrome 06/30/2015  . History of shingles 01/26/2015  . Family history of colon cancer 10/27/2011  . Essential hypertension 10/27/2011    Debbe Odea, PT, DPT 07/14/2018, 11:45 AM  [facility] [address] [place], [place], [address] Phone: [phone]   Fax:  [phone]  Name: Megan Lang MRN: [id number] Date of Birth: October 13, 1971

## 2018-07-17 ENCOUNTER — Ambulatory Visit: Payer: BC Managed Care – PPO | Admitting: Physical Therapy

## 2018-07-17 ENCOUNTER — Encounter: Payer: Self-pay | Admitting: Physical Therapy

## 2018-07-17 DIAGNOSIS — R208 Other disturbances of skin sensation: Secondary | ICD-10-CM | POA: Diagnosis not present

## 2018-07-17 DIAGNOSIS — M542 Cervicalgia: Secondary | ICD-10-CM

## 2018-07-17 DIAGNOSIS — M62838 Other muscle spasm: Secondary | ICD-10-CM

## 2018-07-17 NOTE — Therapy (Signed)
[place], [place], [address] Phone: [phone]   Fax:  [phone]  Physical Therapy Treatment  Patient Details  Name: Megan Lang MRN: [id number] Date of Birth: [birth year] Referring Provider (PT): Beaulah Corin   Encounter Date: 07/17/2018  PT End of Session - 07/17/18 1103    Visit Number  8    Number of Visits  13    Date for PT Re-Evaluation  08/02/18    Authorization Type  BCBS    PT Start Time  1103    PT Stop Time  1157    PT Time Calculation (min)  54 min    Activity Tolerance  Patient tolerated treatment well    Behavior During Therapy  [facility] for tasks assessed/performed       Past Medical History:  Diagnosis Date  . Acid reflux   . Allergy   . Aortic aneurysm ([place])   . Asthma   . Cardiomegaly   . Carpal tunnel syndrome   . Gastric polyp   . GERD (gastroesophageal reflux disease) 01/23/2016  . Hiatal hernia   . Hypertension   . Normal spontaneous vaginal delivery    3    Past Surgical History:  Procedure Laterality Date  . CARPAL TUNNEL RELEASE  2009  . CARPAL TUNNEL RELEASE Left   . COLONOSCOPY    . NASAL SINUS SURGERY     DEC 15,2016  . ROTATOR CUFF REPAIR Left 04/14/2018    There were no vitals filed for this visit.  Subjective Assessment - 07/17/18 1104    Subjective  "from the injection I feel alittle bit better but not a whole lot, I still get the warm feeling that comes and goes. I am unsure of what to expect my l arm was having increased pain "     Patient Stated Goals  decrease the N/T and pain and provide relief,     Currently in Pain?  Yes    Pain Score  2     Pain Orientation  Right;Left    Pain Type  Chronic pain    Pain Onset  More than a month ago    Pain Frequency  Intermittent    Aggravating Factors   moving the head    Pain Relieving Factors  chin tucks, heating pad         [facility] PT Assessment - 07/17/18 1114      Observation/Other Assessments    Focus on Therapeutic Outcomes (FOTO)   53% limited                   [facility] Adult PT Treatment/Exercise - 07/17/18 0001      Neck Exercises: Seated   Neck Retraction  10 reps;10 secs   chin tuck head lift   Other Seated Exercise  cervical SNAGS 1 x 10 bil with towel   given as HEP     Traction   Min (lbs)  8    Max (lbs)  13    Hold Time  60    Rest Time  10    Time  15      Manual Therapy   Manual therapy comments  MTPR along bil upper trap/ levator scapulae, and scalenes    Joint Mobilization  C4-C7 L lateral gapping mobs grade 3      Neck Exercises: Stretches   Upper Trapezius Stretch  Right;Left;2 reps;30 seconds    Levator Stretch  Right;Left;2 reps;30 seconds  PT Education - 07/17/18 1139    Education Details  updated HEP for cervical SNAGS    Person(s) Educated  Patient    Methods  Explanation;Demonstration;Verbal cues;Handout    Comprehension  Verbalized understanding;Returned demonstration;Verbal cues required       PT Short Term Goals - 07/17/18 1144      PT SHORT TERM GOAL #1   Title  pt to be I with inital HEP     Time  3    Period  Weeks    Status  Achieved      PT SHORT TERM GOAL #2   Title  pt to verbalize/ demo proper posture and lifting mechanics to prevent and reduce neck pain    Time  3    Period  Weeks    Status  Achieved      PT SHORT TERM GOAL #3   Title  pt to demonstrate decreased upper trap spasm bil to decrease pain to </= 4/10 and promote cervical mobility     Time  3    Period  Weeks    Status  Achieved        PT Long Term Goals - 06/21/18 1302      PT LONG TERM GOAL #1   Title  pt to incrase cervical flexion/ exension by >/ 8 degrees and sidebending/ rotation by >/= 10 degrees with <= 2/10 pain or report of referral symptoms for safety with driving     Time  6    Period  Weeks    Status  New    Target Date  08/02/18      PT LONG TERM GOAL #2   Title  pt to report numbness/ tingling occurrence  in the bil UE happening </= 1 x a week to promote improvement in condition    Time  6    Period  Weeks    Status  New    Target Date  08/02/18      PT LONG TERM GOAL #3   Title  pt to return to normal work related activities lifting/ pushing/ pulling as allowed for LUE (RCR) with </=2/10 and no report of N/T in bil UE    Time  6    Period  Weeks    Status  New    Target Date  08/02/18      PT LONG TERM GOAL #4   Title  pt to improve FOTO score to </= 35% limited to demo improvement in function     Time  6    Period  Weeks    Status  New    Target Date  08/02/18      PT LONG TERM GOAL #5   Title  pt to be I with all HEP given as of last visit to maintain and progress current level of function     Time  6    Period  Weeks    Status  New    Target Date  08/02/18            Plan - 07/17/18 1142    Clinical Impression Statement  pt reports continued feeling of warmth in the arms with L>R and feels limited improvement since her epidural. continued working on cervical mobs which she reported no warmth in the arms, and cervical stability. continued cervical stability increased min resistanace by 1# keeping max at 13#. end of session she reported feeling good with no warmth sensation.     PT Next  Visit Plan  thoracic mobility, STW along upper trap/ scanlenes, rib mobs, cervical stability, traction PRN    PT Home Exercise Plan  upper trap stretch, chin tuck head lift, upper cervical rotation, thoracic rotation, rib mobs, sub-occipital release SNAGs.    Consulted and Agree with Plan of Care  Patient       Patient will benefit from skilled therapeutic intervention in order to improve the following deficits and impairments:  Pain, Decreased activity tolerance, Decreased endurance, Postural dysfunction, Improper body mechanics, Increased fascial restricitons, Decreased strength, Decreased range of motion, Impaired UE functional use  Visit Diagnosis: Cervicalgia  Other muscle  spasm  Other disturbances of skin sensation     Problem List Patient Active Problem List   Diagnosis Date Noted  . Shoulder impingement syndrome, left 12/19/2017  . Scoliosis (and kyphoscoliosis), idiopathic 07/01/2017  . Chronic bilateral back pain 07/01/2017  . Nexplanon in place 07/29/2016  . Cardiomegaly 07/01/2016  . History of aortic aneurysm 07/01/2016  . GERD (gastroesophageal reflux disease) 01/23/2016  . Dyspnea 10/27/2015  . Costochondritis 08/26/2015  . Obesity 07/01/2015  . Upper airway cough syndrome 06/30/2015  . History of shingles 01/26/2015  . Family history of colon cancer 10/27/2011  . Essential hypertension 10/27/2011   Starr Lake PT, DPT, LAT, ATC  07/17/18  11:46 AM      [facility] [address] [place], [place], [address] Phone: [phone]   Fax:  [phone]  Name: Megan Lang MRN: [id number] Date of Birth: [birth year]

## 2018-07-19 ENCOUNTER — Encounter: Payer: Self-pay | Admitting: Physical Therapy

## 2018-07-19 ENCOUNTER — Ambulatory Visit: Payer: BC Managed Care – PPO | Attending: Physician Assistant | Admitting: Physical Therapy

## 2018-07-19 DIAGNOSIS — R208 Other disturbances of skin sensation: Secondary | ICD-10-CM | POA: Diagnosis present

## 2018-07-19 DIAGNOSIS — M542 Cervicalgia: Secondary | ICD-10-CM | POA: Diagnosis present

## 2018-07-19 DIAGNOSIS — M62838 Other muscle spasm: Secondary | ICD-10-CM | POA: Insufficient documentation

## 2018-07-19 NOTE — Therapy (Signed)
[place], [place], [address] Phone: [phone]   Fax:  [phone]  Physical Therapy Treatment  Patient Details  Name: Megan Lang MRN: [id number] Date of Birth: Mar 10, 1971 Referring Provider (PT): Beaulah Corin   Encounter Date: 07/19/2018  PT End of Session - 07/19/18 1156    Visit Number  9    Number of Visits  13    Date for PT Re-Evaluation  08/02/18    PT Start Time  1152    PT Stop Time  1247    PT Time Calculation (min)  55 min    Activity Tolerance  Patient tolerated treatment well    Behavior During Therapy  [facility] for tasks assessed/performed       Past Medical History:  Diagnosis Date  . Acid reflux   . Allergy   . Aortic aneurysm ([place])   . Asthma   . Cardiomegaly   . Carpal tunnel syndrome   . Gastric polyp   . GERD (gastroesophageal reflux disease) 01/23/2016  . Hiatal hernia   . Hypertension   . Normal spontaneous vaginal delivery    3    Past Surgical History:  Procedure Laterality Date  . CARPAL TUNNEL RELEASE  2009  . CARPAL TUNNEL RELEASE Left   . COLONOSCOPY    . NASAL SINUS SURGERY     DEC 15,2016  . ROTATOR CUFF REPAIR Left 04/14/2018    There were no vitals filed for this visit.  Subjective Assessment - 07/19/18 1156    Subjective  " I am not sure a big difference of the exercise and not really sure what to do, I see my surgeon on Friday"    Currently in Pain?  Yes    Pain Score  2     Pain Orientation  Right;Left    Pain Descriptors / Indicators  --   warmth   Pain Type  Chronic pain    Pain Onset  More than a month ago    Pain Frequency  Intermittent                       [facility] Adult PT Treatment/Exercise - 07/19/18 1201      Exercises   Exercises  Shoulder;Neck      Neck Exercises: Supine   Neck Retraction  10 reps;5 secs   in neutral, 1 set looking to the R, 1 set to the L (MAI)     Shoulder Exercises: Supine   Protraction   Strengthening;10 reps;Both   bil  x 2 sets   Other Supine Exercises  Foam roll routine (using rolled up towels), bil ceiling punches, horizontal abd/ adduction, alternating ceiling punches, back stroke, 1 x 12 each       Traction   Type of Traction  --   table lowered to promote facet opening    Min (lbs)  8    Max (lbs)  13    Hold Time  60    Rest Time  10    Time  15      Manual Therapy   Joint Mobilization  C4-C7 L lateral gapping mobs grade 3      Neck Exercises: Stretches   Upper Trapezius Stretch  Right;Left;2 reps;30 seconds    Levator Stretch  Right;Left;2 reps;30 seconds               PT Short Term Goals - 07/17/18 1144  PT SHORT TERM GOAL #1   Title  pt to be I with inital HEP     Time  3    Period  Weeks    Status  Achieved      PT SHORT TERM GOAL #2   Title  pt to verbalize/ demo proper posture and lifting mechanics to prevent and reduce neck pain    Time  3    Period  Weeks    Status  Achieved      PT SHORT TERM GOAL #3   Title  pt to demonstrate decreased upper trap spasm bil to decrease pain to </= 4/10 and promote cervical mobility     Time  3    Period  Weeks    Status  Achieved        PT Long Term Goals - 06/21/18 1302      PT LONG TERM GOAL #1   Title  pt to incrase cervical flexion/ exension by >/ 8 degrees and sidebending/ rotation by >/= 10 degrees with <= 2/10 pain or report of referral symptoms for safety with driving     Time  6    Period  Weeks    Status  New    Target Date  08/02/18      PT LONG TERM GOAL #2   Title  pt to report numbness/ tingling occurrence in the bil UE happening </= 1 x a week to promote improvement in condition    Time  6    Period  Weeks    Status  New    Target Date  08/02/18      PT LONG TERM GOAL #3   Title  pt to return to normal work related activities lifting/ pushing/ pulling as allowed for LUE (RCR) with </=2/10 and no report of N/T in bil UE    Time  6    Period  Weeks    Status   New    Target Date  08/02/18      PT LONG TERM GOAL #4   Title  pt to improve FOTO score to </= 35% limited to demo improvement in function     Time  6    Period  Weeks    Status  New    Target Date  08/02/18      PT LONG TERM GOAL #5   Title  pt to be I with all HEP given as of last visit to maintain and progress current level of function     Time  6    Period  Weeks    Status  New    Target Date  08/02/18            Plan - 07/19/18 1231    Clinical Impression Statement  Mrs Reindel reports continued warmth in bil UE's but reports improvement with exercise but symptoms return. Focused on cervical and scapular stability exercise which she reported no abnormal sensation. discussed how to track symptoms to measure whether or not if there is an improvement. continued utilizing traction which she reports significant improvement.     PT Treatment/Interventions  ADLs/Self Care Home Management;Cryotherapy;Electrical Stimulation;Iontophoresis 4mg /ml Dexamethasone;Moist Heat;Traction;Ultrasound;Therapeutic activities;Therapeutic exercise;Manual techniques;Taping;Vasopneumatic Device    PT Next Visit Plan  thoracic mobility, STW along upper trap/ scanlenes, rib mobs, cervical stability, traction PRN    PT Home Exercise Plan  upper trap stretch, chin tuck head lift, upper cervical rotation, thoracic rotation, rib mobs, sub-occipital release SNAGs.    Consulted  and Agree with Plan of Care  Patient       Patient will benefit from skilled therapeutic intervention in order to improve the following deficits and impairments:  Pain, Decreased activity tolerance, Decreased endurance, Postural dysfunction, Improper body mechanics, Increased fascial restricitons, Decreased strength, Decreased range of motion, Impaired UE functional use  Visit Diagnosis: Cervicalgia  Other muscle spasm  Other disturbances of skin sensation     Problem List Patient Active Problem List   Diagnosis Date Noted   . Shoulder impingement syndrome, left 12/19/2017  . Scoliosis (and kyphoscoliosis), idiopathic 07/01/2017  . Chronic bilateral back pain 07/01/2017  . Nexplanon in place 07/29/2016  . Cardiomegaly 07/01/2016  . History of aortic aneurysm 07/01/2016  . GERD (gastroesophageal reflux disease) 01/23/2016  . Dyspnea 10/27/2015  . Costochondritis 08/26/2015  . Obesity 07/01/2015  . Upper airway cough syndrome 06/30/2015  . History of shingles 01/26/2015  . Family history of colon cancer 10/27/2011  . Essential hypertension 10/27/2011   Starr Lake PT, DPT, LAT, ATC  07/19/18  12:35 PM      [facility] [address] [place], [place], [address] Phone: [phone]   Fax:  [phone]  Name: BEAUTIFULL CISAR MRN: [id number] Date of Birth: [birth year]

## 2018-07-20 ENCOUNTER — Emergency Department (HOSPITAL_COMMUNITY)
Admission: EM | Admit: 2018-07-20 | Discharge: 2018-07-20 | Disposition: A | Discharge status: Home / Self Care | Payer: BC Managed Care – PPO | Attending: Emergency Medicine | Admitting: Emergency Medicine

## 2018-07-20 ENCOUNTER — Ambulatory Visit: Payer: BC Managed Care – PPO | Admitting: Physician Assistant

## 2018-07-20 ENCOUNTER — Encounter (HOSPITAL_COMMUNITY): Payer: Self-pay | Admitting: *Deleted

## 2018-07-20 ENCOUNTER — Other Ambulatory Visit: Payer: Self-pay

## 2018-07-20 DIAGNOSIS — E876 Hypokalemia: Secondary | ICD-10-CM | POA: Insufficient documentation

## 2018-07-20 DIAGNOSIS — J45909 Unspecified asthma, uncomplicated: Secondary | ICD-10-CM | POA: Diagnosis not present

## 2018-07-20 DIAGNOSIS — R102 Pelvic and perineal pain: Secondary | ICD-10-CM | POA: Diagnosis not present

## 2018-07-20 DIAGNOSIS — K5900 Constipation, unspecified: Secondary | ICD-10-CM | POA: Diagnosis not present

## 2018-07-20 DIAGNOSIS — I10 Essential (primary) hypertension: Secondary | ICD-10-CM | POA: Diagnosis not present

## 2018-07-20 DIAGNOSIS — R109 Unspecified abdominal pain: Secondary | ICD-10-CM | POA: Diagnosis present

## 2018-07-20 LAB — I-STAT CHEM 8, ED
BUN: 5 mg/dL — AB (ref 6–20)
CHLORIDE: 101 mmol/L (ref 98–111)
Calcium, Ion: 1.21 mmol/L (ref 1.15–1.40)
Creatinine, Ser: 0.6 mg/dL (ref 0.44–1.00)
Glucose, Bld: 91 mg/dL (ref 70–99)
HEMATOCRIT: 43 % (ref 36.0–46.0)
Hemoglobin: 14.6 g/dL (ref 12.0–15.0)
Potassium: 3.3 mmol/L — ABNORMAL LOW (ref 3.5–5.1)
SODIUM: 138 mmol/L (ref 135–145)
TCO2: 26 mmol/L (ref 22–32)

## 2018-07-20 LAB — URINALYSIS, ROUTINE W REFLEX MICROSCOPIC
Bilirubin Urine: NEGATIVE
Glucose, UA: NEGATIVE mg/dL
Hgb urine dipstick: NEGATIVE
Ketones, ur: NEGATIVE mg/dL
LEUKOCYTES UA: NEGATIVE
NITRITE: NEGATIVE
PH: 6 (ref 5.0–8.0)
Protein, ur: NEGATIVE mg/dL
SPECIFIC GRAVITY, URINE: 1.006 (ref 1.005–1.030)

## 2018-07-20 LAB — I-STAT BETA HCG BLOOD, ED (MC, WL, AP ONLY): I-stat hCG, quantitative: <5 m[IU]/mL (ref <5)

## 2018-07-20 MED ORDER — POTASSIUM CHLORIDE CRYS ER 20 MEQ PO TBCR: 40.0000 meq | EXTENDED_RELEASE_TABLET | Freq: Two times a day (BID) | ORAL | 0 refills | Status: DC

## 2018-07-20 MED ORDER — DOCUSATE SODIUM 100 MG PO CAPS: 100.0000 mg | ORAL_CAPSULE | Freq: Two times a day (BID) | ORAL | 0 refills | Status: DC

## 2018-07-20 MED ORDER — POTASSIUM CHLORIDE CRYS ER 20 MEQ PO TBCR
80.0000 meq | EXTENDED_RELEASE_TABLET | Freq: Once | ORAL | Status: AC
  Administered 2018-07-20: 80 meq via ORAL
  Filled 2018-07-20: qty 4

## 2018-07-20 MED ORDER — MAGNESIUM CITRATE PO SOLN
1.0000 | Freq: Once | ORAL | Status: AC
  Administered 2018-07-20: 1 via ORAL
  Filled 2018-07-20: qty 296

## 2018-07-20 NOTE — ED Triage Notes (Addendum)
Pt complains of abdominal pain, pelvic pain, and back pain. Pt states symptoms are worse at night. Pt denies n/v/d. Pt states she recently had a steroid injection for spinal stenosis. Pt states she had x-ray showing a large amount of stool and was told she had blood in her urine at her doctor's office yesterday.

## 2018-07-20 NOTE — ED Provider Notes (Signed)
Emergency Department Provider Note   I have reviewed the triage vital signs and the nursing notes.   HISTORY  Chief Complaint Abdominal Pain; Back Pain; and Pelvic Pain   HPI Megan Lang is a 47 y.o. female with medical problems as documented below the presents the emergency department today with abdominal pain.  Patient states that suprapubic in nature.  Is been gone for couple weeks.  She saw her primary doctor for yesterday and told that she had significant constipation on an x-ray along with blood in her urine but no other abnormalities.  They start her on MiraLAX and fiber and sent home.  She states that the symptoms are still there.  She will wake up and urinate multiple times a night and while she is urinating her suprapubic area is quite painful but after she stops it seems to improve until the bladder fills again.  She had decreased bowel movements during this last couple weeks as well states that bowel movements do seem to help as well.  No blood in her stool no dark stools no mucus in her stools.  No recent fevers, vaginal discharge or pain with intercourse.  States she had urinary tract infections in the past and it feels similar to this. No other associated or modifying symptoms.    Past Medical History:  Diagnosis Date  . Acid reflux   . Allergy   . Aortic aneurysm ([place])   . Asthma   . Cardiomegaly   . Carpal tunnel syndrome   . Gastric polyp   . GERD (gastroesophageal reflux disease) 01/23/2016  . Hiatal hernia   . Hypertension   . Normal spontaneous vaginal delivery    3    Patient Active Problem List   Diagnosis Date Noted  . Shoulder impingement syndrome, left 12/19/2017  . Scoliosis (and kyphoscoliosis), idiopathic 07/01/2017  . Chronic bilateral back pain 07/01/2017  . Nexplanon in place 07/29/2016  . Cardiomegaly 07/01/2016  . History of aortic aneurysm 07/01/2016  . GERD (gastroesophageal reflux disease) 01/23/2016  . Dyspnea 10/27/2015  .  Costochondritis 08/26/2015  . Obesity 07/01/2015  . Upper airway cough syndrome 06/30/2015  . History of shingles 01/26/2015  . Family history of colon cancer 10/27/2011  . Essential hypertension 10/27/2011    Past Surgical History:  Procedure Laterality Date  . CARPAL TUNNEL RELEASE  2009  . CARPAL TUNNEL RELEASE Left   . COLONOSCOPY    . NASAL SINUS SURGERY     DEC 15,2016  . ROTATOR CUFF REPAIR Left 04/14/2018    Current Outpatient Rx  . Order #: [id number] Class: Historical Med  . Order #: [id number] Class: Print  . Order #: [id number] Class: Normal  . Order #: [id number] Class: Normal  . Order #: [id number] Class: Normal  . Order #: [id number] Class: Normal  . Order #: [id number] Class: Historical Med  . Order #: [id number] Class: Historical Med  . Order #: [id number] Class: Historical Med  . Order #: [id number] Class: Normal  . Order #: [id number] Class: Historical Med  . Order #: [id number] Class: Historical Med  . Order #: [id number] Class: Normal  . Order #: [id number] Class: Historical Med  . Order #: [id number] Class: Normal  . Order #: [id number] Class: Normal    Allergies Moxifloxacin; Clindamycin; Codeine; Levofloxacin; Metronidazole; Minocin [minocycline hcl]; and Penicillins  Family History  Problem Relation Age of Onset  . Hypertension Mother   . Kidney disease Father   . Liver disease Father        Unknown diagnosis  . Breast cancer Maternal Grandmother   .  Hypertension Sister   . Hypertension Brother     Social History Social History   Tobacco Use  . Smoking status: Never Smoker  . Smokeless tobacco: Never Used  Substance Use Topics  . Alcohol use: No    Alcohol/week: 0.0 standard drinks  . Drug use: No    Review of Systems  All other systems negative except as documented in the HPI. All pertinent positives and negatives as reviewed in the HPI. ____________________________________________   PHYSICAL EXAM:  VITAL SIGNS: Blood pressure (!) 161/101, pulse 88,  temperature 98.3 F (36.8 C), resp. rate 18, SpO2 100 %.  Constitutional: Alert and oriented. Well appearing and in no acute distress. Eyes: Conjunctivae are normal. PERRL. EOMI. Head: Atraumatic. Nose: No congestion/rhinnorhea. Mouth/Throat: Mucous membranes are moist.  Oropharynx non-erythematous. Neck: No stridor.  No meningeal signs.   Cardiovascular: Normal rate, regular rhythm. Good peripheral circulation. Grossly normal heart sounds.   Respiratory: Normal respiratory effort.  No retractions. Lungs CTAB. Gastrointestinal: Soft and ttp in suprapubic. No distention.  Musculoskeletal: No lower extremity tenderness nor edema. No gross deformities of extremities. Neurologic:  Normal speech and language. No gross focal neurologic deficits are appreciated.  Skin:  Skin is warm, dry and intact. No rash noted.   ____________________________________________   LABS (all labs ordered are listed, but only abnormal results are displayed)  Labs Reviewed  I-STAT CHEM 8, ED - Abnormal; Notable for the following components:      Result Value   Potassium 3.3 (*)    BUN 5 (*)    All other components within normal limits  URINE CULTURE  URINALYSIS, ROUTINE W REFLEX MICROSCOPIC  POC URINE PREG, ED  I-STAT BETA HCG BLOOD, ED ([facility], [facility], [facility] ONLY)   ____________________________________________    INITIAL IMPRESSION / ASSESSMENT AND PLAN / ED COURSE  Consider urinary tract infection but more likely patient is just having constipation is probably pushing on her bladder and increased pressure is was causing her discomfort and relief with urination and also what she is urinating so often.  We will do a postvoid residual as she does have a history of spinal issues just to make sure that this does not seem like she is having urinary retention.  Symptomatic treatment.  PVR with only 6cc in bladder. Suspect hypokalemia induced constipation. Will give Mag Citrate and colace. pcp follow up.   Pertinent  labs & imaging results that were available during my care of the patient were reviewed by me and considered in my medical decision making (see chart for details).  ____________________________________________  FINAL CLINICAL IMPRESSION(S) / ED DIAGNOSES  Final diagnoses:  Constipation, unspecified constipation type  Hypokalemia     MEDICATIONS GIVEN DURING THIS VISIT:  Medications  potassium chloride SA (K-DUR,KLOR-CON) CR tablet 80 mEq (80 mEq Oral Given 07/20/18 1342)  magnesium citrate solution 1 Bottle (1 Bottle Oral Given 07/20/18 1342)     NEW OUTPATIENT MEDICATIONS STARTED DURING THIS VISIT:  Discharge Medication List as of 07/20/2018  1:27 PM    START taking these medications   Details  docusate sodium (COLACE) 100 MG capsule Take 1 capsule (100 mg total) by mouth every 12 (twelve) hours., Starting Thu 07/20/2018, Normal    potassium chloride SA (K-DUR,KLOR-CON) 20 MEQ tablet Take 2 tablets (40 mEq total) by mouth 2 (two) times daily., Starting Thu 07/20/2018, Normal        Note:  This note was prepared with assistance of Dragon voice recognition software. Occasional wrong-word or sound-a-like substitutions may  have occurred due to the inherent limitations of voice recognition software.   Merrily Pew, MD 07/20/18 [phone]

## 2018-07-21 ENCOUNTER — Telehealth: Payer: Self-pay | Admitting: Physician Assistant

## 2018-07-21 ENCOUNTER — Encounter: Payer: Self-pay | Admitting: Obstetrics & Gynecology

## 2018-07-21 ENCOUNTER — Ambulatory Visit (INDEPENDENT_AMBULATORY_CARE_PROVIDER_SITE_OTHER): Payer: BC Managed Care – PPO | Admitting: Obstetrics & Gynecology

## 2018-07-21 VITALS — BP 140/90

## 2018-07-21 DIAGNOSIS — R102 Pelvic and perineal pain: Secondary | ICD-10-CM | POA: Diagnosis not present

## 2018-07-21 DIAGNOSIS — N644 Mastodynia: Secondary | ICD-10-CM | POA: Diagnosis not present

## 2018-07-21 LAB — URINE CULTURE

## 2018-07-21 NOTE — Progress Notes (Signed)
    Megan Lang [birth year] [id number]        47 y.o.  [id number]   RP: Left breast heaviness x 3 months and Pelvic discomfort x 1 week  HPI: Patient is anxious in association with a close friend just died rapidly of breast cancer.  Patient reports a left rotator cuff surgery in June 2019.  Since about the same time, patient feels like her left breast is heavier.  No lump felt and no breast pain.  No nipple discharge and no skin change on the breast.  Right breast is normal.  Screening mammogram was negative in March 2019.  No family history of breast cancer.  Patient is on Nexplanon.  Occasional mild breakthrough bleeding.  Complains of a mid pelvic discomfort for a week and mild left pelvic pain since last night.  Presented to urgent care and urine analysis was negative.  History of diverticulosis and diverticulitis.  Will schedule a visit with her gastroenterologist as needed.  Abstinent currently.  No vaginal discharge.  No fever.   OB History  Gravida Para Term Preterm AB Living  3 3 3     3   SAB TAB Ectopic Multiple Live Births          3    # Outcome Date GA Lbr Len/2nd Weight Sex Delivery Anes PTL Lv  3 Term     F Vag-Spont  N LIV  2 Term     F Vag-Spont  N LIV  1 Term     F Vag-Spont  N LIV    Past medical history,surgical history, problem list, medications, allergies, family history and social history were all reviewed and documented in the EPIC chart.   Directed ROS with pertinent positives and negatives documented in the history of present illness/assessment and plan.  Exam:  Vitals:   07/21/18 1058  BP: 140/90   General appearance:  Normal  Breast exam: Right breast normal.  No right axillary lymph node felt.  Left breast: No nodule or mass felt.  Nontender to palpation.  Skin and nipple normal.  No axillary lymph node felt on the left.  Abdomen: Normal  Gynecologic exam: Vulva normal.  Bimanual exam: Uterus anteverted, normal volume, nontender, mobile.  No adnexal  mass felt, nontender bilaterally.   Assessment/Plan:  47 y.o. G3P3003   1. Pain of left breast .  Left breast heaviness and anxiety associated with a close friend who just passed away from breast cancer rapidly.  Breast exam normal.  Last mammogram negative in March 2019 and no family history of breast cancer.  Will proceed with a diagnostic left mammogram and breast ultrasound.  2. Pelvic pain in female On Nexplanon with occasional mild breakthrough bleeding.  Pelvic discomfort in the midline into the left.  Will rule out endometrial pathology and left ovarian pathology with a pelvic ultrasound.  Patient has a history of diverticulosis/diverticulitis, will schedule a visit with her gastroenterologist as needed. - US Transvaginal Non-OB; Future  Counseling on above issues and coordination of care more than 50% for 25 minutes.  Princess Bruins MD, 11:05 AM 07/21/2018

## 2018-07-21 NOTE — Patient Instructions (Signed)
1. Pain of left breast .  Left breast heaviness and anxiety associated with a close friend who just passed away from breast cancer rapidly.  Breast exam normal.  Last mammogram negative in March 2019 and no family history of breast cancer.  Will proceed with a diagnostic left mammogram and breast ultrasound.  2. Pelvic pain in female On Nexplanon with occasional mild breakthrough bleeding.  Pelvic discomfort in the midline into the left.  Will rule out endometrial pathology and left ovarian pathology with a pelvic ultrasound.  Patient has a history of diverticulosis/diverticulitis, will schedule a visit with her gastroenterologist as needed. - US Transvaginal Non-OB; Future  Lanier, it was a pleasure seeing you today!

## 2018-07-21 NOTE — Telephone Encounter (Signed)
Patient dropped off form for provider to complete giving surgical clearence.  Form was placed in provider box at nurse's station

## 2018-07-22 ENCOUNTER — Encounter (HOSPITAL_COMMUNITY): Payer: Self-pay | Admitting: Emergency Medicine

## 2018-07-22 ENCOUNTER — Emergency Department (HOSPITAL_COMMUNITY)
Admission: EM | Admit: 2018-07-22 | Discharge: 2018-07-22 | Disposition: A | Discharge status: Home / Self Care | Payer: BC Managed Care – PPO | Attending: Emergency Medicine | Admitting: Emergency Medicine

## 2018-07-22 ENCOUNTER — Emergency Department (HOSPITAL_COMMUNITY): Payer: BC Managed Care – PPO

## 2018-07-22 DIAGNOSIS — J45909 Unspecified asthma, uncomplicated: Secondary | ICD-10-CM | POA: Diagnosis not present

## 2018-07-22 DIAGNOSIS — R102 Pelvic and perineal pain: Secondary | ICD-10-CM

## 2018-07-22 DIAGNOSIS — Z79899 Other long term (current) drug therapy: Secondary | ICD-10-CM | POA: Insufficient documentation

## 2018-07-22 DIAGNOSIS — I10 Essential (primary) hypertension: Secondary | ICD-10-CM | POA: Diagnosis not present

## 2018-07-22 DIAGNOSIS — N83202 Unspecified ovarian cyst, left side: Secondary | ICD-10-CM | POA: Insufficient documentation

## 2018-07-22 DIAGNOSIS — R1032 Left lower quadrant pain: Secondary | ICD-10-CM

## 2018-07-22 LAB — COMPREHENSIVE METABOLIC PANEL
ALK PHOS: 60 U/L (ref 38–126)
ALT: 21 U/L (ref 0–44)
AST: 16 U/L (ref 15–41)
Albumin: 4.4 g/dL (ref 3.5–5.0)
Anion gap: 8 (ref 5–15)
BILIRUBIN TOTAL: 1.3 mg/dL — AB (ref 0.3–1.2)
BUN: 10 mg/dL (ref 6–20)
CALCIUM: 9.8 mg/dL (ref 8.9–10.3)
CO2: 26 mmol/L (ref 22–32)
CREATININE: 0.66 mg/dL (ref 0.44–1.00)
Chloride: 107 mmol/L (ref 98–111)
GFR calc Af Amer: >60 mL/min (ref >60)
GFR calc non Af Amer: >60 mL/min (ref >60)
GLUCOSE: 100 mg/dL — AB (ref 70–99)
Potassium: 4.1 mmol/L (ref 3.5–5.1)
SODIUM: 141 mmol/L (ref 135–145)
TOTAL PROTEIN: 7.9 g/dL (ref 6.5–8.1)

## 2018-07-22 LAB — LIPASE, BLOOD: Lipase: 33 U/L (ref 11–51)

## 2018-07-22 LAB — URINALYSIS, ROUTINE W REFLEX MICROSCOPIC
BILIRUBIN URINE: NEGATIVE
Glucose, UA: NEGATIVE mg/dL
Hgb urine dipstick: NEGATIVE
Ketones, ur: NEGATIVE mg/dL
Leukocytes, UA: NEGATIVE
NITRITE: NEGATIVE
PH: 6 (ref 5.0–8.0)
Protein, ur: NEGATIVE mg/dL
SPECIFIC GRAVITY, URINE: 1.005 (ref 1.005–1.030)

## 2018-07-22 LAB — CBC
HCT: 42.9 % (ref 36.0–46.0)
HEMOGLOBIN: 14.8 g/dL (ref 12.0–15.0)
MCH: 31 pg (ref 26.0–34.0)
MCHC: 34.5 g/dL (ref 30.0–36.0)
MCV: 89.7 fL (ref 78.0–100.0)
Platelets: 292 10*3/uL (ref 150–400)
RBC: 4.78 MIL/uL (ref 3.87–5.11)
RDW: 12.6 % (ref 11.5–15.5)
WBC: 6.4 10*3/uL (ref 4.0–10.5)

## 2018-07-22 LAB — I-STAT BETA HCG BLOOD, ED (MC, WL, AP ONLY): I-stat hCG, quantitative: <5 m[IU]/mL (ref <5)

## 2018-07-22 MED ORDER — PHENAZOPYRIDINE HCL 200 MG PO TABS: 200.0000 mg | ORAL_TABLET | Freq: Three times a day (TID) | ORAL | 0 refills | Status: DC

## 2018-07-22 MED ORDER — IBUPROFEN 600 MG PO TABS: 600.0000 mg | ORAL_TABLET | Freq: Four times a day (QID) | ORAL | 0 refills | Status: DC | PRN

## 2018-07-22 MED ORDER — NAPROXEN 500 MG PO TABS
500.0000 mg | ORAL_TABLET | Freq: Once | ORAL | Status: AC
  Administered 2018-07-22: 500 mg via ORAL
  Filled 2018-07-22: qty 1

## 2018-07-22 MED ORDER — CEPHALEXIN 500 MG PO CAPS: 500.0000 mg | ORAL_CAPSULE | Freq: Four times a day (QID) | ORAL | 0 refills | Status: DC

## 2018-07-22 MED ORDER — METRONIDAZOLE 500 MG PO TABS: 500.0000 mg | ORAL_TABLET | Freq: Two times a day (BID) | ORAL | 0 refills | Status: DC

## 2018-07-22 NOTE — ED Triage Notes (Signed)
Patient here from home with complaints of abd pain and back pain. Reports that she was seen for same on 10/3 and they did not do a CT scan. Also reports that she was seen by PCP, and they think it is something concerning her pancreas.

## 2018-07-22 NOTE — ED Provider Notes (Signed)
[facility] DEPT Provider Note   CSN: [id number] Arrival date & time: 07/22/18  1002     History   Chief Complaint Chief Complaint  Patient presents with  . Abdominal Pain  . Back Pain    HPI Megan Lang is a 47 y.o. female.  HPI  47 year old female comes in with chief complaint of abdominal pain and back pain.  Patient has history of ascending thoracic aortic aneurysm, diverticulosis, hypertension.  Patient reports that she started having lower quadrant abdominal pain about 2 weeks ago.  Her abdominal pain is intermittent, worse at night, and it is radiating to the back.  There is no flank pain.  Patient is having urinary frequency, but she denies any dysuria.  She has no history of kidney infections, UTI, kidney stones.  Patient also denies any vaginal discharge, bleeding and is not aware of any pelvic disorders.  Bowel movements have been normal.  Patient was seen in the ER few days ago.  She was diagnosed with constipation and started on medications.  She has since seen her PCP and was told that her amylase was slightly elevated and she had trace blood in the urine.  Since patient was complaining of intermittent pain, they advised her to either wait for routine follow-up versus going to the ER, and the patient has chosen the latter.  Past Medical History:  Diagnosis Date  . Acid reflux   . Allergy   . Aortic aneurysm ([place])   . Asthma   . Cardiomegaly   . Carpal tunnel syndrome   . Gastric polyp   . GERD (gastroesophageal reflux disease) 01/23/2016  . Hiatal hernia   . Hypertension   . Normal spontaneous vaginal delivery    3    Patient Active Problem List   Diagnosis Date Noted  . Shoulder impingement syndrome, left 12/19/2017  . Scoliosis (and kyphoscoliosis), idiopathic 07/01/2017  . Chronic bilateral back pain 07/01/2017  . Nexplanon in place 07/29/2016  . Cardiomegaly 07/01/2016  . History of aortic aneurysm 07/01/2016  . GERD  (gastroesophageal reflux disease) 01/23/2016  . Dyspnea 10/27/2015  . Costochondritis 08/26/2015  . Obesity 07/01/2015  . Upper airway cough syndrome 06/30/2015  . History of shingles 01/26/2015  . Family history of colon cancer 10/27/2011  . Essential hypertension 10/27/2011    Past Surgical History:  Procedure Laterality Date  . CARPAL TUNNEL RELEASE  2009  . CARPAL TUNNEL RELEASE Left   . COLONOSCOPY    . NASAL SINUS SURGERY     DEC 15,2016  . ROTATOR CUFF REPAIR Left 04/14/2018     OB History    Gravida  3   Para  3   Term  3   Preterm      AB      Living  3     SAB      TAB      Ectopic      Multiple      Live Births  3            Home Medications    Prior to Admission medications   Medication Sig Start Date End Date Taking? Authorizing Provider  acetaminophen (TYLENOL) 500 MG tablet Take 500 mg by mouth every 4 (four) hours as needed for moderate pain.    Yes [provider]  amLODipine (NORVASC) 5 MG tablet Take 1 tablet (5 mg total) by mouth daily. 06/27/18  Yes Weber, Sarah L, PA-C  azelastine (ASTELIN) 0.1 %  nasal spray Place 2 sprays into both nostrils 2 (two) times daily. Use in each nostril as directed 11/16/17  Yes Jeffery, Chelle, PA  bisoprolol-hydrochlorothiazide (ZIAC) 5-6.25 MG tablet Take 1 tablet by mouth daily. 06/27/18  Yes Weber, Sarah L, PA-C  cyclobenzaprine (FLEXERIL) 10 MG tablet Take 0.5-1 tablets (5-10 mg total) by mouth 3 (three) times daily as needed. Patient taking differently: Take 10-20 mg by mouth 3 (three) times daily as needed for muscle spasms.  06/06/18  Yes Tereasa Coop, PA-C  etonogestrel (NEXPLANON) 68 MG IMPL implant 1 each by Subdermal route once.   Yes [provider]  fluticasone (FLONASE) 50 MCG/ACT nasal spray Place 1 spray into the nose daily as needed for allergies.  02/27/18 02/27/19 Yes [provider]  meloxicam (MOBIC) 15 MG tablet Take 1 tablet (15 mg total) by mouth  daily. Patient taking differently: Take 15 mg by mouth daily as needed for pain.  06/27/18  Yes Weber, Damaris Hippo, PA-C  Multiple Vitamins-Minerals (MULTIVITAMIN GUMMIES ADULT PO) Take 2 each by mouth daily.   Yes [provider]  naproxen (NAPROSYN) 500 MG tablet Take 1 tablet (500 mg total) by mouth 2 (two) times daily with a meal. Patient taking differently: Take 500 mg by mouth 2 (two) times daily as needed for mild pain.  06/06/18  Yes Tereasa Coop, PA-C  potassium chloride SA (K-DUR,KLOR-CON) 20 MEQ tablet Take 2 tablets (40 mEq total) by mouth 2 (two) times daily. 07/20/18  Yes Mesner, Corene Cornea, MD  ranitidine (ZANTAC) 150 MG capsule Take 150 mg by mouth daily as needed for heartburn.  05/31/17  Yes [provider]  sodium chloride (OCEAN) 0.65 % SOLN nasal spray Place 1 spray into both nostrils daily.   Yes [provider]  albuterol (PROVENTIL HFA;VENTOLIN HFA) 108 (90 Base) MCG/ACT inhaler Inhale 1-2 puffs into the lungs every 6 (six) hours as needed for wheezing or shortness of breath. Patient not taking: Reported on 07/22/2018 09/03/16   Elnora Morrison, MD  cephALEXin (KEFLEX) 500 MG capsule Take 1 capsule (500 mg total) by mouth 4 (four) times daily. 07/22/18   Varney Biles, MD  docusate sodium (COLACE) 100 MG capsule Take 1 capsule (100 mg total) by mouth every 12 (twelve) hours. 07/20/18   Mesner, Corene Cornea, MD  ibuprofen (ADVIL,MOTRIN) 600 MG tablet Take 1 tablet (600 mg total) by mouth every 6 (six) hours as needed. 07/22/18   Varney Biles, MD  metroNIDAZOLE (FLAGYL) 500 MG tablet Take 1 tablet (500 mg total) by mouth 2 (two) times daily. 07/22/18   Varney Biles, MD  phenazopyridine (PYRIDIUM) 200 MG tablet Take 1 tablet (200 mg total) by mouth 3 (three) times daily. 07/22/18   Varney Biles, MD    Family History Family History  Problem Relation Age of Onset  . Hypertension Mother   . Kidney disease Father   . Liver disease Father        Unknown diagnosis   . Breast cancer Maternal Grandmother   . Hypertension Sister   . Hypertension Brother     Social History Social History   Tobacco Use  . Smoking status: Never Smoker  . Smokeless tobacco: Never Used  Substance Use Topics  . Alcohol use: No    Alcohol/week: 0.0 standard drinks  . Drug use: No     Allergies   Moxifloxacin; Clindamycin; Codeine; Levofloxacin; Metronidazole; Minocin [minocycline hcl]; and Penicillins   Review of Systems Review of Systems  Constitutional: Positive for activity change.  Gastrointestinal: Positive for abdominal pain.  Genitourinary: Positive for pelvic pain. Negative for dysuria.  Hematological: Does not bruise/bleed easily.  All other systems reviewed and are negative.    Physical Exam Updated Vital Signs BP (!) 153/99   Pulse 87   Temp 97.6 F (36.4 C) (Oral)   Resp 16   SpO2 99%   Physical Exam  Constitutional: She is oriented to person, place, and time. She appears well-developed.  HENT:  Head: Normocephalic and atraumatic.  Eyes: EOM are normal.  Neck: Normal range of motion. Neck supple.  Cardiovascular: Normal rate.  Pulmonary/Chest: Effort normal.  Abdominal: Bowel sounds are normal. There is tenderness in the suprapubic area and left lower quadrant. There is no rigidity and no guarding.  Neurological: She is alert and oriented to person, place, and time.  Skin: Skin is warm and dry.  Nursing note and vitals reviewed.    ED Treatments / Results  Labs (all labs ordered are listed, but only abnormal results are displayed) Labs Reviewed  COMPREHENSIVE METABOLIC PANEL - Abnormal; Notable for the following components:      Result Value   Glucose, Bld 100 (*)    Total Bilirubin 1.3 (*)    All other components within normal limits  URINALYSIS, ROUTINE W REFLEX MICROSCOPIC - Abnormal; Notable for the following components:   Color, Urine STRAW (*)    All other components within normal limits  LIPASE, BLOOD  CBC  I-STAT  BETA HCG BLOOD, ED ([facility], [facility], [facility] ONLY)    EKG None  Radiology US Pelvis Transvanginal Non-ob (tv Only)  Result Date: 07/22/2018 CLINICAL DATA:  Pelvic pain EXAM: TRANSABDOMINAL AND TRANSVAGINAL ULTRASOUND OF PELVIS DOPPLER ULTRASOUND OF OVARIES TECHNIQUE: Both transabdominal and transvaginal ultrasound examinations of the pelvis were performed. Transabdominal technique was performed for global imaging of the pelvis including uterus, ovaries, adnexal regions, and pelvic cul-de-sac. It was necessary to proceed with endovaginal exam following the transabdominal exam to visualize the ovaries. Color and duplex Doppler ultrasound was utilized to evaluate blood flow to the ovaries. COMPARISON:  CT 06/20/2017 FINDINGS: Uterus Measurements: 9.4 x 5.0 x 5.8 cm. 1.9 cm right fundal fibroid. 2.0 cm posterior body fibroid. Diffusely heterogeneous echotexture. Endometrium Thickness: 2 mm in thickness.  No focal abnormality Right ovary Measurements: 2.7 x 1.9 x 1.6 cm. Normal appearance. No adnexal mass. Left ovary Measurements: 3.4 x 2.8 x 3.3 cm.  3.1 cm simple appearing cyst. Pulsed Doppler evaluation of both ovaries demonstrates normal low-resistance arterial and venous waveforms. Other findings Small amount of free fluid in the pelvis. IMPRESSION: Focal fibroids within the uterus as above, the largest 2 cm. Diffuse heterogeneity throughout the uterus could reflect adenomyosis. 3.1 cm left ovarian cyst.  No evidence of torsion. Electronically Signed   By: Rolm Baptise M.D.   On: 07/22/2018 13:14   US Pelvis (transabdominal Only)  Result Date: 07/22/2018 CLINICAL DATA:  Pelvic pain EXAM: TRANSABDOMINAL AND TRANSVAGINAL ULTRASOUND OF PELVIS DOPPLER ULTRASOUND OF OVARIES TECHNIQUE: Both transabdominal and transvaginal ultrasound examinations of the pelvis were performed. Transabdominal technique was performed for global imaging of the pelvis including uterus, ovaries, adnexal regions, and pelvic cul-de-sac. It was  necessary to proceed with endovaginal exam following the transabdominal exam to visualize the ovaries. Color and duplex Doppler ultrasound was utilized to evaluate blood flow to the ovaries. COMPARISON:  CT 06/20/2017 FINDINGS: Uterus Measurements: 9.4 x 5.0 x 5.8 cm. 1.9 cm right fundal fibroid. 2.0 cm posterior body fibroid. Diffusely heterogeneous echotexture. Endometrium Thickness: 2  mm in thickness.  No focal abnormality Right ovary Measurements: 2.7 x 1.9 x 1.6 cm. Normal appearance. No adnexal mass. Left ovary Measurements: 3.4 x 2.8 x 3.3 cm.  3.1 cm simple appearing cyst. Pulsed Doppler evaluation of both ovaries demonstrates normal low-resistance arterial and venous waveforms. Other findings Small amount of free fluid in the pelvis. IMPRESSION: Focal fibroids within the uterus as above, the largest 2 cm. Diffuse heterogeneity throughout the uterus could reflect adenomyosis. 3.1 cm left ovarian cyst.  No evidence of torsion. Electronically Signed   By: Rolm Baptise M.D.   On: 07/22/2018 13:14   US Pelvic Doppler (torsion R/o Or Mass Arterial Flow)  Result Date: 07/22/2018 CLINICAL DATA:  Pelvic pain EXAM: TRANSABDOMINAL AND TRANSVAGINAL ULTRASOUND OF PELVIS DOPPLER ULTRASOUND OF OVARIES TECHNIQUE: Both transabdominal and transvaginal ultrasound examinations of the pelvis were performed. Transabdominal technique was performed for global imaging of the pelvis including uterus, ovaries, adnexal regions, and pelvic cul-de-sac. It was necessary to proceed with endovaginal exam following the transabdominal exam to visualize the ovaries. Color and duplex Doppler ultrasound was utilized to evaluate blood flow to the ovaries. COMPARISON:  CT 06/20/2017 FINDINGS: Uterus Measurements: 9.4 x 5.0 x 5.8 cm. 1.9 cm right fundal fibroid. 2.0 cm posterior body fibroid. Diffusely heterogeneous echotexture. Endometrium Thickness: 2 mm in thickness.  No focal abnormality Right ovary Measurements: 2.7 x 1.9 x 1.6 cm. Normal  appearance. No adnexal mass. Left ovary Measurements: 3.4 x 2.8 x 3.3 cm.  3.1 cm simple appearing cyst. Pulsed Doppler evaluation of both ovaries demonstrates normal low-resistance arterial and venous waveforms. Other findings Small amount of free fluid in the pelvis. IMPRESSION: Focal fibroids within the uterus as above, the largest 2 cm. Diffuse heterogeneity throughout the uterus could reflect adenomyosis. 3.1 cm left ovarian cyst.  No evidence of torsion. Electronically Signed   By: Rolm Baptise M.D.   On: 07/22/2018 13:14    Procedures Procedures (including critical care time)  Medications Ordered in ED Medications  naproxen (NAPROSYN) tablet 500 mg (500 mg Oral Given 07/22/18 1215)     Initial Impression / Assessment and Plan / ED Course  I have reviewed the triage vital signs and the nursing notes.  Pertinent labs & imaging results that were available during my care of the patient were reviewed by me and considered in my medical decision making (see chart for details).  Clinical Course as of Jul 22 1436  Sat Jul 22, 2018  1421 Results from the ER workup discussed with the patient face to face and all questions answered to the best of my ability.  Patient will be treated for bladder spasms. Given that she has history of diverticulitis, we will give her antibiotic prescription with wait and watch approach.  She will only take the antibiotics if she starts having worsening pain, fevers, diarrhea or bloody stools.  Additionally, strict ER return precautions have been discussed with the patient.  She will see her PCP in 1 week if not getting better, if getting worse then she will return to the ER.  US PELVIS (TRANSABDOMINAL ONLY) [AN]    Clinical Course User Index [AN] Varney Biles, MD    47 year old female comes in with chief complaint of abdominal pain.  She has had intermittent abdominal pain, radiating to the back for the past 2 weeks.  She has history of diverticulosis and  thoracic artery aneurysm.  CT scan from last year reviewed, and she has had a diverticulitis flareup.  Her symptoms today  are different than her diverticulitis.  It seems like she is having urinary frequency, but denies any dysuria and her UA is completely clean.  Abdominal exam overall is reassuring, with mild left lower quadrant pain and moderate suprapubic tenderness without any peritoneal findings.  Ultrasound pelvis will be ordered.  Basic labs have also been ordered.  Differential diagnosis includes ovarian torsion, cystitis, diverticulitis. Based on the evaluation, I do not think patient needs a CT scan unless her labs are significantly abnormal or her symptoms change. Final Clinical Impressions(s) / ED Diagnoses   Final diagnoses:  Pelvic pain  Pelvic pain in female  LLQ abdominal pain  Cyst of left ovary    ED Discharge Orders         Ordered    phenazopyridine (PYRIDIUM) 200 MG tablet  3 times daily     07/22/18 1421    cephALEXin (KEFLEX) 500 MG capsule  4 times daily     07/22/18 1421    metroNIDAZOLE (FLAGYL) 500 MG tablet  2 times daily     07/22/18 1421    ibuprofen (ADVIL,MOTRIN) 600 MG tablet  Every 6 hours PRN     07/22/18 1421           Varney Biles, MD 07/22/18 1449

## 2018-07-22 NOTE — Discharge Instructions (Signed)
We saw you in the ER for the abdominal pain. All the results in the ER are normal, labs and imaging - except for evidence of fibroids and ovarian cyst on ultrasound. We are not sure what is causing your symptoms.  We suspect that your pain is because of bladder spasms, and are sending you home with medications for it. The other possibility includes diverticulitis, take the antibiotics prescribed only if you start having fevers, worsening pain or bloody stools.  The workup in the ER is not complete, and is limited to screening for life threatening and emergent conditions only, so please see a primary care doctor for further evaluation.  Please return to the ER if your symptoms worsen; you have increased pain, fevers, chills, inability to keep any medications down, confusion. Otherwise see the outpatient doctor as requested.

## 2018-07-23 ENCOUNTER — Emergency Department (HOSPITAL_COMMUNITY)
Admission: EM | Admit: 2018-07-23 | Discharge: 2018-07-23 | Disposition: A | Discharge status: Home / Self Care | Payer: BC Managed Care – PPO | Attending: Emergency Medicine | Admitting: Emergency Medicine

## 2018-07-23 ENCOUNTER — Other Ambulatory Visit: Payer: Self-pay

## 2018-07-23 ENCOUNTER — Encounter (HOSPITAL_COMMUNITY): Payer: Self-pay | Admitting: Emergency Medicine

## 2018-07-23 ENCOUNTER — Emergency Department (HOSPITAL_COMMUNITY): Payer: BC Managed Care – PPO

## 2018-07-23 DIAGNOSIS — I10 Essential (primary) hypertension: Secondary | ICD-10-CM | POA: Diagnosis not present

## 2018-07-23 DIAGNOSIS — F419 Anxiety disorder, unspecified: Secondary | ICD-10-CM | POA: Diagnosis not present

## 2018-07-23 DIAGNOSIS — J45909 Unspecified asthma, uncomplicated: Secondary | ICD-10-CM | POA: Diagnosis not present

## 2018-07-23 DIAGNOSIS — R0789 Other chest pain: Secondary | ICD-10-CM | POA: Diagnosis present

## 2018-07-23 DIAGNOSIS — Z79899 Other long term (current) drug therapy: Secondary | ICD-10-CM | POA: Diagnosis not present

## 2018-07-23 LAB — BASIC METABOLIC PANEL
Anion gap: 9 (ref 5–15)
BUN: 11 mg/dL (ref 6–20)
CO2: 24 mmol/L (ref 22–32)
Calcium: 9.2 mg/dL (ref 8.9–10.3)
Chloride: 105 mmol/L (ref 98–111)
Creatinine, Ser: 0.64 mg/dL (ref 0.44–1.00)
GFR calc Af Amer: >60 mL/min (ref >60)
Glucose, Bld: 111 mg/dL — ABNORMAL HIGH (ref 70–99)
POTASSIUM: 3.5 mmol/L (ref 3.5–5.1)
Sodium: 138 mmol/L (ref 135–145)

## 2018-07-23 LAB — CBC
HEMATOCRIT: 41.5 % (ref 36.0–46.0)
Hemoglobin: 14.1 g/dL (ref 12.0–15.0)
MCH: 30.2 pg (ref 26.0–34.0)
MCHC: 34 g/dL (ref 30.0–36.0)
MCV: 88.9 fL (ref 78.0–100.0)
Platelets: 288 10*3/uL (ref 150–400)
RBC: 4.67 MIL/uL (ref 3.87–5.11)
RDW: 12.6 % (ref 11.5–15.5)
WBC: 6.6 10*3/uL (ref 4.0–10.5)

## 2018-07-23 LAB — D-DIMER, QUANTITATIVE: D-Dimer, Quant: 0.29 ug/mL-FEU (ref 0.00–0.50)

## 2018-07-23 LAB — POCT I-STAT TROPONIN I
TROPONIN I, POC: 0 ng/mL (ref 0.00–0.08)
Troponin i, poc: 0 ng/mL (ref 0.00–0.08)

## 2018-07-23 LAB — I-STAT BETA HCG BLOOD, ED (NOT ORDERABLE): I-stat hCG, quantitative: <5 m[IU]/mL (ref <5)

## 2018-07-23 MED ORDER — ALUM & MAG HYDROXIDE-SIMETH 200-200-20 MG/5ML PO SUSP
30.0000 mL | Freq: Once | ORAL | Status: AC
  Administered 2018-07-23: 30 mL via ORAL
  Filled 2018-07-23: qty 30

## 2018-07-23 NOTE — ED Provider Notes (Signed)
[facility] DEPT Provider Note   CSN: [id number] Arrival date & time: 07/23/18  0402     History   Chief Complaint Chief Complaint  Patient presents with  . Chest Pain  . Shortness of Breath    HPI Megan Lang is a 47 y.o. female.  The history is provided by the patient.  Chest Pain   This is a new problem. The current episode started more than 1 week ago (More than 2 weeks ago). The problem occurs constantly. The problem has not changed since onset.The pain is associated with rest. The pain is present in the substernal region. The pain is moderate. The quality of the pain is described as dull. The pain does not radiate. Duration of episode(s) is 2 weeks. Exacerbated by: ongoing issues with cervical stenosis and need for upcoming surgery.   Associated symptoms include palpitations and shortness of breath. Pertinent negatives include no abdominal pain, no cough, no exertional chest pressure, no fever, no headaches, no hemoptysis, no irregular heartbeat, no leg pain, no lower extremity edema, no syncope, no vomiting and no weakness. There are no known risk factors.  Pertinent negatives for past medical history include no MI, no PE and no valve disorder.  Pertinent negatives for family medical history include: no Marfan's syndrome.  Procedure history is negative for cardiac catheterization.  Shortness of Breath  Associated symptoms include chest pain. Pertinent negatives include no fever, no headaches, no cough, no hemoptysis, no syncope, no vomiting, no abdominal pain, no leg pain and no leg swelling. Associated medical issues do not include PE or past MI.  Patient was informed she has spinal stenosis and injections are not helping her neck pain and she states she was told she needs surgery.  Now she has ongoing CP and is unable to sleep.  No f/c/r.  No car trips or plane trips.    Past Medical History:  Diagnosis Date  . Acid reflux   . Allergy   .  Aortic aneurysm ([place])   . Asthma   . Cardiomegaly   . Carpal tunnel syndrome   . Gastric polyp   . GERD (gastroesophageal reflux disease) 01/23/2016  . Hiatal hernia   . Hypertension   . Normal spontaneous vaginal delivery    3    Patient Active Problem List   Diagnosis Date Noted  . Shoulder impingement syndrome, left 12/19/2017  . Scoliosis (and kyphoscoliosis), idiopathic 07/01/2017  . Chronic bilateral back pain 07/01/2017  . Nexplanon in place 07/29/2016  . Cardiomegaly 07/01/2016  . History of aortic aneurysm 07/01/2016  . GERD (gastroesophageal reflux disease) 01/23/2016  . Dyspnea 10/27/2015  . Costochondritis 08/26/2015  . Obesity 07/01/2015  . Upper airway cough syndrome 06/30/2015  . History of shingles 01/26/2015  . Family history of colon cancer 10/27/2011  . Essential hypertension 10/27/2011    Past Surgical History:  Procedure Laterality Date  . CARPAL TUNNEL RELEASE  2009  . CARPAL TUNNEL RELEASE Left   . COLONOSCOPY    . NASAL SINUS SURGERY     DEC 15,2016  . ROTATOR CUFF REPAIR Left 04/14/2018     OB History    Gravida  3   Para  3   Term  3   Preterm      AB      Living  3     SAB      TAB      Ectopic      Multiple  Live Births  3            Home Medications    Prior to Admission medications   Medication Sig Start Date End Date Taking? Authorizing Provider  acetaminophen (TYLENOL) 500 MG tablet Take 500 mg by mouth every 4 (four) hours as needed for moderate pain.     [provider]  albuterol (PROVENTIL HFA;VENTOLIN HFA) 108 (90 Base) MCG/ACT inhaler Inhale 1-2 puffs into the lungs every 6 (six) hours as needed for wheezing or shortness of breath. Patient not taking: Reported on 07/22/2018 09/03/16   Elnora Morrison, MD  amLODipine (NORVASC) 5 MG tablet Take 1 tablet (5 mg total) by mouth daily. 06/27/18   Weber, Damaris Hippo, PA-C  azelastine (ASTELIN) 0.1 % nasal spray Place 2 sprays into both nostrils 2 (two)  times daily. Use in each nostril as directed 11/16/17   Harrison Mons, PA  bisoprolol-hydrochlorothiazide (ZIAC) 5-6.25 MG tablet Take 1 tablet by mouth daily. 06/27/18   Weber, Damaris Hippo, PA-C  cephALEXin (KEFLEX) 500 MG capsule Take 1 capsule (500 mg total) by mouth 4 (four) times daily. 07/22/18   Varney Biles, MD  cyclobenzaprine (FLEXERIL) 10 MG tablet Take 0.5-1 tablets (5-10 mg total) by mouth 3 (three) times daily as needed. Patient taking differently: Take 10-20 mg by mouth 3 (three) times daily as needed for muscle spasms.  06/06/18   Tereasa Coop, PA-C  docusate sodium (COLACE) 100 MG capsule Take 1 capsule (100 mg total) by mouth every 12 (twelve) hours. 07/20/18   Mesner, Corene Cornea, MD  etonogestrel (NEXPLANON) 68 MG IMPL implant 1 each by Subdermal route once.    [provider]  fluticasone (FLONASE) 50 MCG/ACT nasal spray Place 1 spray into the nose daily as needed for allergies.  02/27/18 02/27/19  [provider]  ibuprofen (ADVIL,MOTRIN) 600 MG tablet Take 1 tablet (600 mg total) by mouth every 6 (six) hours as needed. 07/22/18   Varney Biles, MD  meloxicam (MOBIC) 15 MG tablet Take 1 tablet (15 mg total) by mouth daily. Patient taking differently: Take 15 mg by mouth daily as needed for pain.  06/27/18   Weber, Damaris Hippo, PA-C  metroNIDAZOLE (FLAGYL) 500 MG tablet Take 1 tablet (500 mg total) by mouth 2 (two) times daily. 07/22/18   Varney Biles, MD  Multiple Vitamins-Minerals (MULTIVITAMIN GUMMIES ADULT PO) Take 2 each by mouth daily.    [provider]  naproxen (NAPROSYN) 500 MG tablet Take 1 tablet (500 mg total) by mouth 2 (two) times daily with a meal. Patient taking differently: Take 500 mg by mouth 2 (two) times daily as needed for mild pain.  06/06/18   Tereasa Coop, PA-C  phenazopyridine (PYRIDIUM) 200 MG tablet Take 1 tablet (200 mg total) by mouth 3 (three) times daily. 07/22/18   Varney Biles, MD  potassium chloride SA (K-DUR,KLOR-CON) 20  MEQ tablet Take 2 tablets (40 mEq total) by mouth 2 (two) times daily. 07/20/18   Mesner, Corene Cornea, MD  ranitidine (ZANTAC) 150 MG capsule Take 150 mg by mouth daily as needed for heartburn.  05/31/17   [provider]  sodium chloride (OCEAN) 0.65 % SOLN nasal spray Place 1 spray into both nostrils daily.    [provider]    Family History Family History  Problem Relation Age of Onset  . Hypertension Mother   . Kidney disease Father   . Liver disease Father        Unknown diagnosis  . Breast cancer  Maternal Grandmother   . Hypertension Sister   . Hypertension Brother     Social History Social History   Tobacco Use  . Smoking status: Never Smoker  . Smokeless tobacco: Never Used  Substance Use Topics  . Alcohol use: No    Alcohol/week: 0.0 standard drinks  . Drug use: No     Allergies   Moxifloxacin; Clindamycin; Codeine; Levofloxacin; Metronidazole; Minocin [minocycline hcl]; and Penicillins   Review of Systems Review of Systems  Constitutional: Negative for fever.  Respiratory: Positive for shortness of breath. Negative for cough and hemoptysis.   Cardiovascular: Positive for chest pain and palpitations. Negative for leg swelling and syncope.  Gastrointestinal: Negative for abdominal pain and vomiting.  Neurological: Negative for weakness and headaches.  Psychiatric/Behavioral: The patient is nervous/anxious.   All other systems reviewed and are negative.    Physical Exam Updated Vital Signs BP (!) 143/95   Pulse 95   Temp 98.3 F (36.8 C) (Oral)   Resp 19   Ht 5\' 3"  (1.6 m)   Wt 79.4 kg   SpO2 99%   BMI 31.00 kg/m   Physical Exam  Constitutional: She is oriented to person, place, and time. She appears well-developed and well-nourished. No distress.  HENT:  Head: Normocephalic and atraumatic.  Mouth/Throat: No oropharyngeal exudate.  Eyes: Pupils are equal, round, and reactive to light. Conjunctivae are normal.  Neck: Normal range of  motion. Neck supple.  Cardiovascular: Normal rate, regular rhythm, normal heart sounds and intact distal pulses.  Pulmonary/Chest: Effort normal and breath sounds normal. No stridor. She has no wheezes. She has no rales.  Abdominal: Soft. Bowel sounds are normal. She exhibits no mass. There is no tenderness. There is no rebound and no guarding.  Musculoskeletal: Normal range of motion. She exhibits no edema or tenderness.  Neurological: She is alert and oriented to person, place, and time. She displays normal reflexes.  Skin: Skin is warm and dry. Capillary refill takes less than 2 seconds. She is not diaphoretic.  Psychiatric: Her mood appears anxious.     ED Treatments / Results  Labs (all labs ordered are listed, but only abnormal results are displayed) Results for orders placed or performed during the hospital encounter of 30/16/01  Basic metabolic panel  Result Value Ref Range   Sodium 138 135 - 145 mmol/L   Potassium 3.5 3.5 - 5.1 mmol/L   Chloride 105 98 - 111 mmol/L   CO2 24 22 - 32 mmol/L   Glucose, Bld 111 (H) 70 - 99 mg/dL   BUN 11 6 - 20 mg/dL   Creatinine, Ser 0.64 0.44 - 1.00 mg/dL   Calcium 9.2 8.9 - 10.3 mg/dL   GFR calc non Af Amer >60 >60 mL/min   GFR calc Af Amer >60 >60 mL/min   Anion gap 9 5 - 15  CBC  Result Value Ref Range   WBC 6.6 4.0 - 10.5 K/uL   RBC 4.67 3.87 - 5.11 MIL/uL   Hemoglobin 14.1 12.0 - 15.0 g/dL   HCT 41.5 36.0 - 46.0 %   MCV 88.9 78.0 - 100.0 fL   MCH 30.2 26.0 - 34.0 pg   MCHC 34.0 30.0 - 36.0 g/dL   RDW 12.6 11.5 - 15.5 %   Platelets 288 150 - 400 K/uL  D-dimer, quantitative (not at [facility])  Result Value Ref Range   D-Dimer, Quant 0.29 0.00 - 0.50 ug/mL-FEU  POCT i-Stat troponin I  Result Value Ref Range   Troponin  i, poc 0.00 0.00 - 0.08 ng/mL   Comment 3          I-Stat beta hCG blood, ED  Result Value Ref Range   I-stat hCG, quantitative <5.0 <5 mIU/mL   Comment 3           Dg Chest 2 View  Result Date:  07/23/2018 CLINICAL DATA:  Chest pain EXAM: CHEST - 2 VIEW COMPARISON:  Chest CT 12/07/2017 FINDINGS: The cardiomediastinal contours are normal. The lungs are clear. Pulmonary vasculature is normal. No consolidation, pleural effusion, or pneumothorax. No acute osseous abnormalities are seen. IMPRESSION: No acute findings. Electronically Signed   By: Keith Rake M.D.   On: 07/23/2018 04:47   US Pelvis Transvanginal Non-ob (tv Only)  Result Date: 07/22/2018 CLINICAL DATA:  Pelvic pain EXAM: TRANSABDOMINAL AND TRANSVAGINAL ULTRASOUND OF PELVIS DOPPLER ULTRASOUND OF OVARIES TECHNIQUE: Both transabdominal and transvaginal ultrasound examinations of the pelvis were performed. Transabdominal technique was performed for global imaging of the pelvis including uterus, ovaries, adnexal regions, and pelvic cul-de-sac. It was necessary to proceed with endovaginal exam following the transabdominal exam to visualize the ovaries. Color and duplex Doppler ultrasound was utilized to evaluate blood flow to the ovaries. COMPARISON:  CT 06/20/2017 FINDINGS: Uterus Measurements: 9.4 x 5.0 x 5.8 cm. 1.9 cm right fundal fibroid. 2.0 cm posterior body fibroid. Diffusely heterogeneous echotexture. Endometrium Thickness: 2 mm in thickness.  No focal abnormality Right ovary Measurements: 2.7 x 1.9 x 1.6 cm. Normal appearance. No adnexal mass. Left ovary Measurements: 3.4 x 2.8 x 3.3 cm.  3.1 cm simple appearing cyst. Pulsed Doppler evaluation of both ovaries demonstrates normal low-resistance arterial and venous waveforms. Other findings Small amount of free fluid in the pelvis. IMPRESSION: Focal fibroids within the uterus as above, the largest 2 cm. Diffuse heterogeneity throughout the uterus could reflect adenomyosis. 3.1 cm left ovarian cyst.  No evidence of torsion. Electronically Signed   By: Rolm Baptise M.D.   On: 07/22/2018 13:14   US Pelvis (transabdominal Only)  Result Date: 07/22/2018 CLINICAL DATA:  Pelvic pain EXAM:  TRANSABDOMINAL AND TRANSVAGINAL ULTRASOUND OF PELVIS DOPPLER ULTRASOUND OF OVARIES TECHNIQUE: Both transabdominal and transvaginal ultrasound examinations of the pelvis were performed. Transabdominal technique was performed for global imaging of the pelvis including uterus, ovaries, adnexal regions, and pelvic cul-de-sac. It was necessary to proceed with endovaginal exam following the transabdominal exam to visualize the ovaries. Color and duplex Doppler ultrasound was utilized to evaluate blood flow to the ovaries. COMPARISON:  CT 06/20/2017 FINDINGS: Uterus Measurements: 9.4 x 5.0 x 5.8 cm. 1.9 cm right fundal fibroid. 2.0 cm posterior body fibroid. Diffusely heterogeneous echotexture. Endometrium Thickness: 2 mm in thickness.  No focal abnormality Right ovary Measurements: 2.7 x 1.9 x 1.6 cm. Normal appearance. No adnexal mass. Left ovary Measurements: 3.4 x 2.8 x 3.3 cm.  3.1 cm simple appearing cyst. Pulsed Doppler evaluation of both ovaries demonstrates normal low-resistance arterial and venous waveforms. Other findings Small amount of free fluid in the pelvis. IMPRESSION: Focal fibroids within the uterus as above, the largest 2 cm. Diffuse heterogeneity throughout the uterus could reflect adenomyosis. 3.1 cm left ovarian cyst.  No evidence of torsion. Electronically Signed   By: Rolm Baptise M.D.   On: 07/22/2018 13:14   US Pelvic Doppler (torsion R/o Or Mass Arterial Flow)  Result Date: 07/22/2018 CLINICAL DATA:  Pelvic pain EXAM: TRANSABDOMINAL AND TRANSVAGINAL ULTRASOUND OF PELVIS DOPPLER ULTRASOUND OF OVARIES TECHNIQUE: Both transabdominal and transvaginal ultrasound examinations of the pelvis  were performed. Transabdominal technique was performed for global imaging of the pelvis including uterus, ovaries, adnexal regions, and pelvic cul-de-sac. It was necessary to proceed with endovaginal exam following the transabdominal exam to visualize the ovaries. Color and duplex Doppler ultrasound was utilized  to evaluate blood flow to the ovaries. COMPARISON:  CT 06/20/2017 FINDINGS: Uterus Measurements: 9.4 x 5.0 x 5.8 cm. 1.9 cm right fundal fibroid. 2.0 cm posterior body fibroid. Diffusely heterogeneous echotexture. Endometrium Thickness: 2 mm in thickness.  No focal abnormality Right ovary Measurements: 2.7 x 1.9 x 1.6 cm. Normal appearance. No adnexal mass. Left ovary Measurements: 3.4 x 2.8 x 3.3 cm.  3.1 cm simple appearing cyst. Pulsed Doppler evaluation of both ovaries demonstrates normal low-resistance arterial and venous waveforms. Other findings Small amount of free fluid in the pelvis. IMPRESSION: Focal fibroids within the uterus as above, the largest 2 cm. Diffuse heterogeneity throughout the uterus could reflect adenomyosis. 3.1 cm left ovarian cyst.  No evidence of torsion. Electronically Signed   By: Rolm Baptise M.D.   On: 07/22/2018 13:14    EKG EKG Interpretation  Date/Time:  Sunday July 23 2018 04:13:55 EDT Ventricular Rate:  97 PR Interval:    QRS Duration: 82 QT Interval:  347 QTC Calculation: 441 R Axis:   50 Text Interpretation:  Sinus rhythm Confirmed by Dory Horn) on 07/23/2018 4:55:45 AM   Radiology Dg Chest 2 View  Result Date: 07/23/2018 CLINICAL DATA:  Chest pain EXAM: CHEST - 2 VIEW COMPARISON:  Chest CT 12/07/2017 FINDINGS: The cardiomediastinal contours are normal. The lungs are clear. Pulmonary vasculature is normal. No consolidation, pleural effusion, or pneumothorax. No acute osseous abnormalities are seen. IMPRESSION: No acute findings. Electronically Signed   By: Keith Rake M.D.   On: 07/23/2018 04:47   US Pelvis Transvanginal Non-ob (tv Only)  Result Date: 07/22/2018 CLINICAL DATA:  Pelvic pain EXAM: TRANSABDOMINAL AND TRANSVAGINAL ULTRASOUND OF PELVIS DOPPLER ULTRASOUND OF OVARIES TECHNIQUE: Both transabdominal and transvaginal ultrasound examinations of the pelvis were performed. Transabdominal technique was performed for global imaging  of the pelvis including uterus, ovaries, adnexal regions, and pelvic cul-de-sac. It was necessary to proceed with endovaginal exam following the transabdominal exam to visualize the ovaries. Color and duplex Doppler ultrasound was utilized to evaluate blood flow to the ovaries. COMPARISON:  CT 06/20/2017 FINDINGS: Uterus Measurements: 9.4 x 5.0 x 5.8 cm. 1.9 cm right fundal fibroid. 2.0 cm posterior body fibroid. Diffusely heterogeneous echotexture. Endometrium Thickness: 2 mm in thickness.  No focal abnormality Right ovary Measurements: 2.7 x 1.9 x 1.6 cm. Normal appearance. No adnexal mass. Left ovary Measurements: 3.4 x 2.8 x 3.3 cm.  3.1 cm simple appearing cyst. Pulsed Doppler evaluation of both ovaries demonstrates normal low-resistance arterial and venous waveforms. Other findings Small amount of free fluid in the pelvis. IMPRESSION: Focal fibroids within the uterus as above, the largest 2 cm. Diffuse heterogeneity throughout the uterus could reflect adenomyosis. 3.1 cm left ovarian cyst.  No evidence of torsion. Electronically Signed   By: Rolm Baptise M.D.   On: 07/22/2018 13:14   US Pelvis (transabdominal Only)  Result Date: 07/22/2018 CLINICAL DATA:  Pelvic pain EXAM: TRANSABDOMINAL AND TRANSVAGINAL ULTRASOUND OF PELVIS DOPPLER ULTRASOUND OF OVARIES TECHNIQUE: Both transabdominal and transvaginal ultrasound examinations of the pelvis were performed. Transabdominal technique was performed for global imaging of the pelvis including uterus, ovaries, adnexal regions, and pelvic cul-de-sac. It was necessary to proceed with endovaginal exam following the transabdominal exam to visualize the ovaries. Color  and duplex Doppler ultrasound was utilized to evaluate blood flow to the ovaries. COMPARISON:  CT 06/20/2017 FINDINGS: Uterus Measurements: 9.4 x 5.0 x 5.8 cm. 1.9 cm right fundal fibroid. 2.0 cm posterior body fibroid. Diffusely heterogeneous echotexture. Endometrium Thickness: 2 mm in thickness.  No  focal abnormality Right ovary Measurements: 2.7 x 1.9 x 1.6 cm. Normal appearance. No adnexal mass. Left ovary Measurements: 3.4 x 2.8 x 3.3 cm.  3.1 cm simple appearing cyst. Pulsed Doppler evaluation of both ovaries demonstrates normal low-resistance arterial and venous waveforms. Other findings Small amount of free fluid in the pelvis. IMPRESSION: Focal fibroids within the uterus as above, the largest 2 cm. Diffuse heterogeneity throughout the uterus could reflect adenomyosis. 3.1 cm left ovarian cyst.  No evidence of torsion. Electronically Signed   By: Rolm Baptise M.D.   On: 07/22/2018 13:14   US Pelvic Doppler (torsion R/o Or Mass Arterial Flow)  Result Date: 07/22/2018 CLINICAL DATA:  Pelvic pain EXAM: TRANSABDOMINAL AND TRANSVAGINAL ULTRASOUND OF PELVIS DOPPLER ULTRASOUND OF OVARIES TECHNIQUE: Both transabdominal and transvaginal ultrasound examinations of the pelvis were performed. Transabdominal technique was performed for global imaging of the pelvis including uterus, ovaries, adnexal regions, and pelvic cul-de-sac. It was necessary to proceed with endovaginal exam following the transabdominal exam to visualize the ovaries. Color and duplex Doppler ultrasound was utilized to evaluate blood flow to the ovaries. COMPARISON:  CT 06/20/2017 FINDINGS: Uterus Measurements: 9.4 x 5.0 x 5.8 cm. 1.9 cm right fundal fibroid. 2.0 cm posterior body fibroid. Diffusely heterogeneous echotexture. Endometrium Thickness: 2 mm in thickness.  No focal abnormality Right ovary Measurements: 2.7 x 1.9 x 1.6 cm. Normal appearance. No adnexal mass. Left ovary Measurements: 3.4 x 2.8 x 3.3 cm.  3.1 cm simple appearing cyst. Pulsed Doppler evaluation of both ovaries demonstrates normal low-resistance arterial and venous waveforms. Other findings Small amount of free fluid in the pelvis. IMPRESSION: Focal fibroids within the uterus as above, the largest 2 cm. Diffuse heterogeneity throughout the uterus could reflect  adenomyosis. 3.1 cm left ovarian cyst.  No evidence of torsion. Electronically Signed   By: Rolm Baptise M.D.   On: 07/22/2018 13:14    Procedures Procedures (including critical care time)  Medications Ordered in ED Medications  alum & mag hydroxide-simeth (MAALOX/MYLANTA) 200-200-20 MG/5ML suspension 30 mL (has no administration in time range)       Final Clinical Impressions(s) / ED Diagnoses   Symptoms consistent with stress reaction/ anxiety.  Ruled out for MI and PE in the ED.  Follow up you PMD to discuss ongoing treatment of your symptoms.     Return for weakness, numbness, changes in vision or speech, fevers >100.4 unrelieved by medication, shortness of breath, intractable vomiting, or diarrhea, abdominal pain, Inability to tolerate liquids or food, cough, altered mental status or any concerns. No signs of systemic illness or infection. The patient is nontoxic-appearing on exam and vital signs are within normal limits.    I have reviewed the triage vital signs and the nursing notes. Pertinent labs &imaging results that were available during my care of the patient were reviewed by me and considered in my medical decision making (see chart for details).  After history, exam, and medical workup I feel the patient has been appropriately medically screened and is safe for discharge home. Pertinent diagnoses were discussed with the patient. Patient was given return precautions.   Deion Forgue, MD 07/23/18 [phone]

## 2018-07-23 NOTE — ED Triage Notes (Signed)
Pt reports having intermittent chest pain for the last several weeks after being diagnosed with spinal stenosis. Pt also reporting shortness of breath.

## 2018-07-23 NOTE — ED Notes (Signed)
Patient ambulated to bathroom with minimal assistance.  

## 2018-07-24 ENCOUNTER — Telehealth: Payer: Self-pay | Admitting: *Deleted

## 2018-07-24 DIAGNOSIS — N644 Mastodynia: Secondary | ICD-10-CM

## 2018-07-24 NOTE — Telephone Encounter (Signed)
Appointment scheduled at [facility] center on 07/27/18 @ 10:20am, patient informed.

## 2018-07-24 NOTE — Telephone Encounter (Signed)
-----   Message from Princess Bruins, MD sent at 07/21/2018 11:20 AM EDT ----- Regarding: Schedule Left Dx Mammo/US Left breast heaviness.

## 2018-07-25 ENCOUNTER — Ambulatory Visit: Payer: BC Managed Care – PPO | Admitting: Physical Therapy

## 2018-07-25 ENCOUNTER — Ambulatory Visit: Payer: BC Managed Care – PPO | Admitting: Family Medicine

## 2018-07-25 ENCOUNTER — Encounter: Payer: Self-pay | Admitting: Physical Therapy

## 2018-07-25 DIAGNOSIS — M542 Cervicalgia: Secondary | ICD-10-CM

## 2018-07-25 DIAGNOSIS — M62838 Other muscle spasm: Secondary | ICD-10-CM

## 2018-07-25 DIAGNOSIS — R208 Other disturbances of skin sensation: Secondary | ICD-10-CM

## 2018-07-25 NOTE — Progress Notes (Deleted)
No chief complaint on file.   HPI  4 review of systems  Past Medical History:  Diagnosis Date  . Acid reflux   . Allergy   . Aortic aneurysm ([place])   . Asthma   . Cardiomegaly   . Carpal tunnel syndrome   . Gastric polyp   . GERD (gastroesophageal reflux disease) 01/23/2016  . Hiatal hernia   . Hypertension   . Normal spontaneous vaginal delivery    3    Current Outpatient Medications  Medication Sig Dispense Refill  . acetaminophen (TYLENOL) 500 MG tablet Take 500 mg by mouth every 4 (four) hours as needed for moderate pain.     Marland Kitchen albuterol (PROVENTIL HFA;VENTOLIN HFA) 108 (90 Base) MCG/ACT inhaler Inhale 1-2 puffs into the lungs every 6 (six) hours as needed for wheezing or shortness of breath. (Patient not taking: Reported on 07/22/2018) 1 Inhaler 0  . amLODipine (NORVASC) 5 MG tablet Take 1 tablet (5 mg total) by mouth daily. 90 tablet 1  . azelastine (ASTELIN) 0.1 % nasal spray Place 2 sprays into both nostrils 2 (two) times daily. Use in each nostril as directed 30 mL 0  . bisoprolol-hydrochlorothiazide (ZIAC) 5-6.25 MG tablet Take 1 tablet by mouth daily. 90 tablet 1  . cephALEXin (KEFLEX) 500 MG capsule Take 1 capsule (500 mg total) by mouth 4 (four) times daily. 28 capsule 0  . cyclobenzaprine (FLEXERIL) 10 MG tablet Take 0.5-1 tablets (5-10 mg total) by mouth 3 (three) times daily as needed. (Patient taking differently: Take 10-20 mg by mouth 3 (three) times daily as needed for muscle spasms. ) 30 tablet 0  . docusate sodium (COLACE) 100 MG capsule Take 1 capsule (100 mg total) by mouth every 12 (twelve) hours. 60 capsule 0  . etonogestrel (NEXPLANON) 68 MG IMPL implant 1 each by Subdermal route once.    . fluticasone (FLONASE) 50 MCG/ACT nasal spray Place 1 spray into the nose daily as needed for allergies.     Marland Kitchen ibuprofen (ADVIL,MOTRIN) 600 MG tablet Take 1 tablet (600 mg total) by mouth every 6 (six) hours as needed. 30 tablet 0  . meloxicam (MOBIC) 15 MG tablet Take 1  tablet (15 mg total) by mouth daily. (Patient taking differently: Take 15 mg by mouth daily as needed for pain. ) 30 tablet 1  . metroNIDAZOLE (FLAGYL) 500 MG tablet Take 1 tablet (500 mg total) by mouth 2 (two) times daily. 14 tablet 0  . Multiple Vitamins-Minerals (MULTIVITAMIN GUMMIES ADULT PO) Take 2 each by mouth daily.    . naproxen (NAPROSYN) 500 MG tablet Take 1 tablet (500 mg total) by mouth 2 (two) times daily with a meal. (Patient taking differently: Take 500 mg by mouth 2 (two) times daily as needed for mild pain. ) 30 tablet 0  . phenazopyridine (PYRIDIUM) 200 MG tablet Take 1 tablet (200 mg total) by mouth 3 (three) times daily. 6 tablet 0  . potassium chloride SA (K-DUR,KLOR-CON) 20 MEQ tablet Take 2 tablets (40 mEq total) by mouth 2 (two) times daily. 20 tablet 0  . ranitidine (ZANTAC) 150 MG capsule Take 150 mg by mouth daily as needed for heartburn.     . sodium chloride (OCEAN) 0.65 % SOLN nasal spray Place 1 spray into both nostrils daily.     No current facility-administered medications for this visit.     Allergies:  Allergies  Allergen Reactions  . Moxifloxacin Swelling    other  . Clindamycin Diarrhea and Other (See Comments)  GI upset  . Codeine Other (See Comments)    TACHYCARDIA   . Levofloxacin Other (See Comments)    other  . Metronidazole Diarrhea  . Minocin [Minocycline Hcl] Other (See Comments)    Stomach Pains  . Penicillins Rash    Has patient had a PCN reaction causing immediate rash, facial/tongue/throat swelling, SOB or lightheadedness with hypotension:yes Has patient had a PCN reaction causing severe rash involving mucus membranes or skin necrosis:no Has patient had a PCN reaction that required hospitalization:no Has patient had a PCN reaction occurring within the last 10 years:No If all of the above answers are "NO", then may proceed with Cephalosporin    Past Surgical History:  Procedure Laterality Date  . CARPAL TUNNEL RELEASE  2009  .  CARPAL TUNNEL RELEASE Left   . COLONOSCOPY    . NASAL SINUS SURGERY     DEC 15,2016  . ROTATOR CUFF REPAIR Left 04/14/2018    Social History   Socioeconomic History  . Marital status: Married    Spouse name: Not on file  . Number of children: 3  . Years of education: Not on file  . Highest education level: Not on file  Occupational History  . Occupation: CUSTODIAL    Employer: [employer]  Social Needs  . Financial resource strain: Not on file  . Food insecurity:    Worry: Not on file    Inability: Not on file  . Transportation needs:    Medical: Not on file    Non-medical: Not on file  Tobacco Use  . Smoking status: Never Smoker  . Smokeless tobacco: Never Used  Substance and Sexual Activity  . Alcohol use: No    Alcohol/week: 0.0 standard drinks  . Drug use: No  . Sexual activity: Yes    Partners: Male    Birth control/protection: Injection    Comment: 1ST INTERCOURSE- 35, PARTNERS- LESS THAN 5   Lifestyle  . Physical activity:    Days per week: Not on file    Minutes per session: Not on file  . Stress: Not on file  Relationships  . Social connections:    Talks on phone: Not on file    Gets together: Not on file    Attends religious service: Not on file    Active member of club or organization: Not on file    Attends meetings of clubs or organizations: Not on file    Relationship status: Not on file  Other Topics Concern  . Not on file  Social History Narrative   Epworth Sleepiness Scale = 8 (as of 10/28/2015)    Family History  Problem Relation Age of Onset  . Hypertension Mother   . Kidney disease Father   . Liver disease Father        Unknown diagnosis  . Breast cancer Maternal Grandmother   . Hypertension Sister   . Hypertension Brother      ROS Review of Systems See HPI Constitution: No fevers or chills No malaise No diaphoresis Skin: No rash or itching Eyes: no blurry vision, no double vision GU: no dysuria or  hematuria Neuro: no dizziness or headaches * all others reviewed and negative   Objective: There were no vitals filed for this visit.  Physical Exam  Assessment and Plan There are no diagnoses linked to this encounter.   Oumar Marcott P [employer]

## 2018-07-25 NOTE — Telephone Encounter (Signed)
I have seen this pt for BV and sinusitis only. She needs to make appt for pre-op clearance. Please call to schedule this. Thank you.

## 2018-07-25 NOTE — Therapy (Signed)
[place], [place], [address] Phone: [phone]   Fax:  [phone]  Physical Therapy Treatment  Patient Details  Name: Megan Lang MRN: [id number] Date of Birth: Jul 13, 1971 Referring Provider (PT): Beaulah Corin   Encounter Date: 07/25/2018  PT End of Session - 07/25/18 1819    Visit Number  10    Number of Visits  13    Date for PT Re-Evaluation  08/02/18    Authorization Type  [employer]    PT Start Time  0730   30 minute appointment with untimed traction   PT Stop Time  0812    PT Time Calculation (min)  42 min    Activity Tolerance  Patient tolerated treatment well    Behavior During Therapy  [facility] for tasks assessed/performed       Past Medical History:  Diagnosis Date  . Acid reflux   . Allergy   . Aortic aneurysm ([place])   . Asthma   . Cardiomegaly   . Carpal tunnel syndrome   . Gastric polyp   . GERD (gastroesophageal reflux disease) 01/23/2016  . Hiatal hernia   . Hypertension   . Normal spontaneous vaginal delivery    3    Past Surgical History:  Procedure Laterality Date  . CARPAL TUNNEL RELEASE  2009  . CARPAL TUNNEL RELEASE Left   . COLONOSCOPY    . NASAL SINUS SURGERY     DEC 15,2016  . ROTATOR CUFF REPAIR Left 04/14/2018    There were no vitals filed for this visit.  Subjective Assessment - 07/25/18 0735    Subjective  I saw the MD and I am going to get 4 levels fusion in neck.      Currently in Pain?  No/denies    Pain Location  Chest    Pain Orientation  Right;Left    Pain Descriptors / Indicators  --   warm feeling   Pain Type  Acute pain    Pain Radiating Towards  into arms    Pain Frequency  Intermittent    Aggravating Factors   moving head    Pain Relieving Factors  traction,  heat         [facility] PT Assessment - 07/25/18 0001      AROM   Cervical - Right Rotation  62    Cervical - Left Rotation  58   measured at beginning of session                   [facility] Adult PT Treatment/Exercise - 07/25/18 0001      Neck Exercises: Seated   Other Seated Exercise  total body rotation with thinking tall5 x each  This feels good.       Shoulder Exercises: Supine   Other Supine Exercises  over rolled towel cervical stabilization exercises all peformed 10 X each    circles, horizontal add/ abduction, flexion /extension and  alternatings UE/LE's      Traction   Type of Traction  Cervical    Min (lbs)  8    Max (lbs)  13    Hold Time  60    Rest Time  10    Time  15             PT Education - 07/25/18 1819    Education Details  exercise form    Person(s) Educated  Patient    Methods  Explanation;Tactile cues;Verbal cues  Comprehension  Verbalized understanding;Returned demonstration       PT Short Term Goals - 07/17/18 1144      PT SHORT TERM GOAL #1   Title  pt to be I with inital HEP     Time  3    Period  Weeks    Status  Achieved      PT SHORT TERM GOAL #2   Title  pt to verbalize/ demo proper posture and lifting mechanics to prevent and reduce neck pain    Time  3    Period  Weeks    Status  Achieved      PT SHORT TERM GOAL #3   Title  pt to demonstrate decreased upper trap spasm bil to decrease pain to </= 4/10 and promote cervical mobility     Time  3    Period  Weeks    Status  Achieved        PT Long Term Goals - 07/25/18 1826      PT LONG TERM GOAL #1   Title  pt to incrase cervical flexion/ exension by >/ 8 degrees and sidebending/ rotation by >/= 10 degrees with <= 2/10 pain or report of referral symptoms for safety with driving     Baseline  Rotation  Right 62.  left 58 degrees    Time  6    Period  Weeks    Status  On-going      PT LONG TERM GOAL #2   Title  pt to report numbness/ tingling occurrence in the bil UE happening </= 1 x a week to promote improvement in condition    Baseline  3 x a week    Time  6    Period  Weeks    Status  On-going      PT LONG TERM  GOAL #3   Title  pt to return to normal work related activities lifting/ pushing/ pulling as allowed for LUE (RCR) with </=2/10 and no report of N/T in bil UE    Baseline  Pulling / pushing hurts  ,  avoids doing    Time  6    Period  Weeks    Status  On-going      PT LONG TERM GOAL #4   Title  pt to improve FOTO score to </= 35% limited to demo improvement in function     Time  6    Period  Weeks    Status  On-going      PT LONG TERM GOAL #5   Title  Pt to be I with all HEP given as of last visit to maintain and progress current level of function     Baseline  minor cues    Time  6    Period  Weeks    Status  On-going            Plan - 07/25/18 1820    Clinical Impression Statement  Mrs Megan Lang needs surgery.  She feels she is getting benifit from PT and she wants to continue.  ( she may have turned in a new order to front desk)  She is concerned some of her symptoms into the arms is from memopause.  I referred her to her MD/ Pharmacy to answer any questions about what to take for menopause symptoms.   AROM  62 right /  58 degrees left.     PT Next Visit Plan  thoracic mobility, STW along upper trap/  scanlenes, rib mobs, cervical stability, traction PRN    PT Home Exercise Plan  upper trap stretch, chin tuck head lift, upper cervical rotation, thoracic rotation, rib mobs, sub-occipital release SNAGs.    Consulted and Agree with Plan of Care  Patient       Patient will benefit from skilled therapeutic intervention in order to improve the following deficits and impairments:     Visit Diagnosis: Cervicalgia  Other muscle spasm  Other disturbances of skin sensation     Problem List Patient Active Problem List   Diagnosis Date Noted  . Shoulder impingement syndrome, left 12/19/2017  . Scoliosis (and kyphoscoliosis), idiopathic 07/01/2017  . Chronic bilateral back pain 07/01/2017  . Nexplanon in place 07/29/2016  . Cardiomegaly 07/01/2016  . History of aortic  aneurysm 07/01/2016  . GERD (gastroesophageal reflux disease) 01/23/2016  . Dyspnea 10/27/2015  . Costochondritis 08/26/2015  . Obesity 07/01/2015  . Upper airway cough syndrome 06/30/2015  . History of shingles 01/26/2015  . Family history of colon cancer 10/27/2011  . Essential hypertension 10/27/2011    HARRIS,KAREN PTA 07/25/2018, 6:37 PM  [facility] [address] [place] [address] [place], [place], [address] Phone: [phone]   Fax:  [phone]  Name: Megan Lang MRN: [id number] Date of Birth: Sep 22, 1971

## 2018-07-26 ENCOUNTER — Ambulatory Visit: Payer: BC Managed Care – PPO | Admitting: Physician Assistant

## 2018-07-26 ENCOUNTER — Ambulatory Visit: Payer: BC Managed Care – PPO | Admitting: Family Medicine

## 2018-07-26 NOTE — Telephone Encounter (Signed)
Called pt to schedule appt for pre op clearance, VM full.unable to leave message. When pt calls back please schedule with [facility]  with pre op clearance appt.

## 2018-07-27 ENCOUNTER — Encounter: Payer: Self-pay | Admitting: Emergency Medicine

## 2018-07-27 ENCOUNTER — Ambulatory Visit: Payer: BC Managed Care – PPO

## 2018-07-27 ENCOUNTER — Ambulatory Visit
Admission: RE | Admit: 2018-07-27 | Discharge: 2018-07-27 | Disposition: A | Discharge status: Home / Self Care | Payer: BC Managed Care – PPO | Source: Ambulatory Visit | Attending: Obstetrics & Gynecology | Admitting: Obstetrics & Gynecology

## 2018-07-27 ENCOUNTER — Ambulatory Visit (INDEPENDENT_AMBULATORY_CARE_PROVIDER_SITE_OTHER): Payer: BC Managed Care – PPO | Admitting: Emergency Medicine

## 2018-07-27 ENCOUNTER — Other Ambulatory Visit: Payer: Self-pay

## 2018-07-27 VITALS — BP 143/90 | HR 110 | Temp 99.0°F | Resp 17 | Ht 63.0 in | Wt 177.2 lb

## 2018-07-27 DIAGNOSIS — F411 Generalized anxiety disorder: Secondary | ICD-10-CM

## 2018-07-27 DIAGNOSIS — I10 Essential (primary) hypertension: Secondary | ICD-10-CM

## 2018-07-27 DIAGNOSIS — N644 Mastodynia: Secondary | ICD-10-CM

## 2018-07-27 MED ORDER — AMLODIPINE BESYLATE 5 MG PO TABS: 5.0000 mg | ORAL_TABLET | Freq: Every day | ORAL | 1 refills | Status: DC

## 2018-07-27 MED ORDER — BISOPROLOL-HYDROCHLOROTHIAZIDE 5-6.25 MG PO TABS: 1.0000 | ORAL_TABLET | Freq: Every day | ORAL | 1 refills | Status: DC

## 2018-07-27 NOTE — Progress Notes (Signed)
Megan Lang 47 y.o.   Chief Complaint  Patient presents with  . discuss bp med    needs refills on amlodipine and bisoprolol-hydrochlorothiazide    HISTORY OF PRESENT ILLNESS: This is a 47 y.o. female with history of hypertension and chronic anxiety, lately very stressed out over need for neck and left shoulder surgeries.  Did not take blood pressure medication today.  Needs refill on both medications.  HPI   Prior to Admission medications   Medication Sig Start Date End Date Taking? Authorizing Provider  amLODipine (NORVASC) 5 MG tablet Take 1 tablet (5 mg total) by mouth daily. 06/27/18  Yes Weber, Damaris Hippo, PA-C  bisoprolol-hydrochlorothiazide (ZIAC) 5-6.25 MG tablet Take 1 tablet by mouth daily. 06/27/18  Yes Weber, Damaris Hippo, PA-C  etonogestrel (NEXPLANON) 68 MG IMPL implant 1 each by Subdermal route once.   Yes [provider]  acetaminophen (TYLENOL) 500 MG tablet Take 500 mg by mouth every 4 (four) hours as needed for moderate pain.     [provider]  albuterol (PROVENTIL HFA;VENTOLIN HFA) 108 (90 Base) MCG/ACT inhaler Inhale 1-2 puffs into the lungs every 6 (six) hours as needed for wheezing or shortness of breath. Patient not taking: Reported on 07/22/2018 09/03/16   Elnora Morrison, MD  azelastine (ASTELIN) 0.1 % nasal spray Place 2 sprays into both nostrils 2 (two) times daily. Use in each nostril as directed Patient not taking: Reported on 07/27/2018 11/16/17   Harrison Mons, PA  cephALEXin (KEFLEX) 500 MG capsule Take 1 capsule (500 mg total) by mouth 4 (four) times daily. Patient not taking: Reported on 07/27/2018 07/22/18   Varney Biles, MD  cyclobenzaprine (FLEXERIL) 10 MG tablet Take 0.5-1 tablets (5-10 mg total) by mouth 3 (three) times daily as needed. Patient not taking: Reported on 07/27/2018 06/06/18   Tereasa Coop, PA-C  docusate sodium (COLACE) 100 MG capsule Take 1 capsule (100 mg total) by mouth every 12 (twelve) hours. Patient not  taking: Reported on 07/27/2018 07/20/18   Mesner, Corene Cornea, MD  fluticasone [facility]) 50 MCG/ACT nasal spray Place 1 spray into the nose daily as needed for allergies.  02/27/18 02/27/19  [provider]  ibuprofen (ADVIL,MOTRIN) 600 MG tablet Take 1 tablet (600 mg total) by mouth every 6 (six) hours as needed. Patient not taking: Reported on 07/27/2018 07/22/18   Varney Biles, MD  meloxicam (MOBIC) 15 MG tablet Take 1 tablet (15 mg total) by mouth daily. Patient not taking: Reported on 07/27/2018 06/27/18   Mancel Bale, PA-C  metroNIDAZOLE (FLAGYL) 500 MG tablet Take 1 tablet (500 mg total) by mouth 2 (two) times daily. Patient not taking: Reported on 07/27/2018 07/22/18   Varney Biles, MD  Multiple Vitamins-Minerals (MULTIVITAMIN GUMMIES ADULT PO) Take 2 each by mouth daily.    [provider]  naproxen (NAPROSYN) 500 MG tablet Take 1 tablet (500 mg total) by mouth 2 (two) times daily with a meal. Patient not taking: Reported on 07/27/2018 06/06/18   Tereasa Coop, PA-C  phenazopyridine (PYRIDIUM) 200 MG tablet Take 1 tablet (200 mg total) by mouth 3 (three) times daily. Patient not taking: Reported on 07/27/2018 07/22/18   Varney Biles, MD  potassium chloride SA (K-DUR,KLOR-CON) 20 MEQ tablet Take 2 tablets (40 mEq total) by mouth 2 (two) times daily. Patient not taking: Reported on 07/27/2018 07/20/18   Mesner, Corene Cornea, MD  ranitidine (ZANTAC) 150 MG capsule Take 150 mg by mouth daily as needed for heartburn.  05/31/17   [provider]  sodium chloride (OCEAN) 0.65 % SOLN nasal spray Place 1 spray into both nostrils daily.    [provider]    Allergies  Allergen Reactions  . Moxifloxacin Swelling    other  . Clindamycin Diarrhea and Other (See Comments)    GI upset  . Codeine Other (See Comments)    TACHYCARDIA   . Levofloxacin Other (See Comments)    other  . Metronidazole Diarrhea  . Minocin [Minocycline Hcl] Other (See Comments)    Stomach  Pains  . Penicillins Rash    Has patient had a PCN reaction causing immediate rash, facial/tongue/throat swelling, SOB or lightheadedness with hypotension:yes Has patient had a PCN reaction causing severe rash involving mucus membranes or skin necrosis:no Has patient had a PCN reaction that required hospitalization:no Has patient had a PCN reaction occurring within the last 10 years:No If all of the above answers are "NO", then may proceed with Cephalosporin    Patient Active Problem List   Diagnosis Date Noted  . Shoulder impingement syndrome, left 12/19/2017  . Scoliosis (and kyphoscoliosis), idiopathic 07/01/2017  . Chronic bilateral back pain 07/01/2017  . Nexplanon in place 07/29/2016  . Cardiomegaly 07/01/2016  . History of aortic aneurysm 07/01/2016  . GERD (gastroesophageal reflux disease) 01/23/2016  . Dyspnea 10/27/2015  . Costochondritis 08/26/2015  . Obesity 07/01/2015  . Upper airway cough syndrome 06/30/2015  . History of shingles 01/26/2015  . Family history of colon cancer 10/27/2011  . Essential hypertension 10/27/2011    Past Medical History:  Diagnosis Date  . Acid reflux   . Allergy   . Aortic aneurysm ([place])   . Asthma   . Cardiomegaly   . Carpal tunnel syndrome   . Gastric polyp   . GERD (gastroesophageal reflux disease) 01/23/2016  . Hiatal hernia   . Hypertension   . Normal spontaneous vaginal delivery    3    Past Surgical History:  Procedure Laterality Date  . CARPAL TUNNEL RELEASE  2009  . CARPAL TUNNEL RELEASE Left   . COLONOSCOPY    . NASAL SINUS SURGERY     DEC 15,2016  . ROTATOR CUFF REPAIR Left 04/14/2018    Social History   Socioeconomic History  . Marital status: Married    Spouse name: Not on file  . Number of children: 3  . Years of education: Not on file  . Highest education level: Not on file  Occupational History  . Occupation: CUSTODIAL    Employer: [employer]  Social Needs  . Financial resource strain:  Not on file  . Food insecurity:    Worry: Not on file    Inability: Not on file  . Transportation needs:    Medical: Not on file    Non-medical: Not on file  Tobacco Use  . Smoking status: Never Smoker  . Smokeless tobacco: Never Used  Substance and Sexual Activity  . Alcohol use: No    Alcohol/week: 0.0 standard drinks  . Drug use: No  . Sexual activity: Yes    Partners: Male    Birth control/protection: Injection    Comment: 1ST INTERCOURSE- 74, PARTNERS- LESS THAN 5   Lifestyle  . Physical activity:    Days per week: Not on file    Minutes per session: Not on file  . Stress: Not on file  Relationships  . Social connections:    Talks on phone: Not on file    Gets together: Not on file  Attends religious service: Not on file    Active member of club or organization: Not on file    Attends meetings of clubs or organizations: Not on file    Relationship status: Not on file  . Intimate partner violence:    Fear of current or ex partner: Not on file    Emotionally abused: Not on file    Physically abused: Not on file    Forced sexual activity: Not on file  Other Topics Concern  . Not on file  Social History Narrative   Epworth Sleepiness Scale = 8 (as of 10/28/2015)    Family History  Problem Relation Age of Onset  . Hypertension Mother   . Kidney disease Father   . Liver disease Father        Unknown diagnosis  . Breast cancer Maternal Grandmother   . Hypertension Sister   . Hypertension Brother      Review of Systems  Constitutional: Negative.  Negative for chills, fever and weight loss.  HENT: Negative.   Eyes: Negative.  Negative for blurred vision and double vision.  Respiratory: Negative.  Negative for cough and shortness of breath.   Cardiovascular: Negative.  Negative for chest pain and palpitations.  Gastrointestinal: Negative.  Negative for abdominal pain, diarrhea, nausea and vomiting.  Genitourinary: Negative.  Negative for dysuria and urgency.    Skin: Negative.  Negative for rash.  Neurological: Negative.  Negative for dizziness and headaches.  Endo/Heme/Allergies: Negative.   Psychiatric/Behavioral: The patient is nervous/anxious.   All other systems reviewed and are negative.  Vitals:   07/27/18 1621  BP: (!) 143/90  Pulse: (!) 110  Resp: 17  Temp: 99 F (37.2 C)  SpO2: 99%     Physical Exam  Constitutional: She is oriented to person, place, and time. She appears well-developed and well-nourished.  HENT:  Head: Normocephalic and atraumatic.  Nose: Nose normal.  Mouth/Throat: Oropharynx is clear and moist.  Eyes: Pupils are equal, round, and reactive to light. Conjunctivae and EOM are normal.  Neck: Normal range of motion. Neck supple.  Cardiovascular: Normal rate, regular rhythm and normal heart sounds.  Pulmonary/Chest: Effort normal and breath sounds normal.  Abdominal: Soft. Bowel sounds are normal. She exhibits no distension. There is no tenderness.  Musculoskeletal: Normal range of motion.  Neurological: She is alert and oriented to person, place, and time. No sensory deficit. She exhibits normal muscle tone.  Skin: Skin is warm and dry. Capillary refill takes less than 2 seconds.  Psychiatric: She has a normal mood and affect. Her behavior is normal.  Vitals reviewed.  A total of 25 minutes was spent in the room with the patient, greater than 50% of which was in counseling/coordination of care regarding hypertension, anxiety, medications, prognosis, and need for follow-up.   ASSESSMENT & PLAN: Wynne was seen today for discuss bp med.  Diagnoses and all orders for this visit:  Essential hypertension -     bisoprolol-hydrochlorothiazide (ZIAC) 5-6.25 MG tablet; Take 1 tablet by mouth daily. -     amLODipine (NORVASC) 5 MG tablet; Take 1 tablet (5 mg total) by mouth daily.  Anxiety state    Patient Instructions       If you have lab work done today you will be contacted with your lab results  within the next 2 weeks.  If you have not heard from [place] then please contact us. The fastest way to get your results is to register for My Chart.  IF you received an x-ray today, you will receive an invoice from [facility]. Please contact [facility] at [phone] with questions or concerns regarding your invoice.   IF you received labwork today, you will receive an invoice from [place]. Please contact [facility] at [phone] with questions or concerns regarding your invoice.   Our billing staff will not be able to assist you with questions regarding bills from these companies.  You will be contacted with the lab results as soon as they are available. The fastest way to get your results is to activate your My Chart account. Instructions are located on the last page of this paperwork. If you have not heard from [place] regarding the results in 2 weeks, please contact this office.     Hypertension Hypertension, commonly called high blood pressure, is when the force of blood pumping through the arteries is too strong. The arteries are the blood vessels that carry blood from the heart throughout the body. Hypertension forces the heart to work harder to pump blood and may cause arteries to become narrow or stiff. Having untreated or uncontrolled hypertension can cause heart attacks, strokes, kidney disease, and other problems. A blood pressure reading consists of a higher number over a lower number. Ideally, your blood pressure should be below 120/80. The first ("top") number is called the systolic pressure. It is a measure of the pressure in your arteries as your heart beats. The second ("bottom") number is called the diastolic pressure. It is a measure of the pressure in your arteries as the heart relaxes. What are the causes? The cause of this condition is not known. What increases the risk? Some risk factors for high blood pressure are under your control. Others are not. Factors  you can change  Smoking.  Having type 2 diabetes mellitus, high cholesterol, or both.  Not getting enough exercise or physical activity.  Being overweight.  Having too much fat, sugar, calories, or salt (sodium) in your diet.  Drinking too much alcohol. Factors that are difficult or impossible to change  Having chronic kidney disease.  Having a family history of high blood pressure.  Age. Risk increases with age.  Race. You may be at higher risk if you are African-American.  Gender. Men are at higher risk than women before age 61. After age 69, women are at higher risk than men.  Having obstructive sleep apnea.  Stress. What are the signs or symptoms? Extremely high blood pressure (hypertensive crisis) may cause:  Headache.  Anxiety.  Shortness of breath.  Nosebleed.  Nausea and vomiting.  Severe chest pain.  Jerky movements you cannot control (seizures).  How is this diagnosed? This condition is diagnosed by measuring your blood pressure while you are seated, with your arm resting on a surface. The cuff of the blood pressure monitor will be placed directly against the skin of your upper arm at the level of your heart. It should be measured at least twice using the same arm. Certain conditions can cause a difference in blood pressure between your right and left arms. Certain factors can cause blood pressure readings to be lower or higher than normal (elevated) for a short period of time:  When your blood pressure is higher when you are in a health care provider's office than when you are at home, this is called white coat hypertension. Most people with this condition do not need medicines.  When your blood pressure is higher at home than when you are in a  health care provider's office, this is called masked hypertension. Most people with this condition may need medicines to control blood pressure.  If you have a high blood pressure reading during one visit or you  have normal blood pressure with other risk factors:  You may be asked to return on a different day to have your blood pressure checked again.  You may be asked to monitor your blood pressure at home for 1 week or longer.  If you are diagnosed with hypertension, you may have other blood or imaging tests to help your health care provider understand your overall risk for other conditions. How is this treated? This condition is treated by making healthy lifestyle changes, such as eating healthy foods, exercising more, and reducing your alcohol intake. Your health care provider may prescribe medicine if lifestyle changes are not enough to get your blood pressure under control, and if:  Your systolic blood pressure is above 130.  Your diastolic blood pressure is above 80.  Your personal target blood pressure may vary depending on your medical conditions, your age, and other factors. Follow these instructions at home: Eating and drinking  Eat a diet that is high in fiber and potassium, and low in sodium, added sugar, and fat. An example eating plan is called the DASH (Dietary Approaches to Stop Hypertension) diet. To eat this way: ? Eat plenty of fresh fruits and vegetables. Try to fill half of your plate at each meal with fruits and vegetables. ? Eat whole grains, such as whole wheat pasta, brown rice, or whole grain bread. Fill about one quarter of your plate with whole grains. ? Eat or drink low-fat dairy products, such as skim milk or low-fat yogurt. ? Avoid fatty cuts of meat, processed or cured meats, and poultry with skin. Fill about one quarter of your plate with lean proteins, such as fish, chicken without skin, beans, eggs, and tofu. ? Avoid premade and processed foods. These tend to be higher in sodium, added sugar, and fat.  Reduce your daily sodium intake. Most people with hypertension should eat less than 1,500 mg of sodium a day.  Limit alcohol intake to no more than 1 drink a day  for nonpregnant women and 2 drinks a day for men. One drink equals 12 oz of beer, 5 oz of wine, or 1 oz of hard liquor. Lifestyle  Work with your health care provider to maintain a healthy body weight or to lose weight. Ask what an ideal weight is for you.  Get at least 30 minutes of exercise that causes your heart to beat faster (aerobic exercise) most days of the week. Activities may include walking, swimming, or biking.  Include exercise to strengthen your muscles (resistance exercise), such as pilates or lifting weights, as part of your weekly exercise routine. Try to do these types of exercises for 30 minutes at least 3 days a week.  Do not use any products that contain nicotine or tobacco, such as cigarettes and e-cigarettes. If you need help quitting, ask your health care provider.  Monitor your blood pressure at home as told by your health care provider.  Keep all follow-up visits as told by your health care provider. This is important. Medicines  Take over-the-counter and prescription medicines only as told by your health care provider. Follow directions carefully. Blood pressure medicines must be taken as prescribed.  Do not skip doses of blood pressure medicine. Doing this puts you at risk for problems and can make the  medicine less effective.  Ask your health care provider about side effects or reactions to medicines that you should watch for. Contact a health care provider if:  You think you are having a reaction to a medicine you are taking.  You have headaches that keep coming back (recurring).  You feel dizzy.  You have swelling in your ankles.  You have trouble with your vision. Get help right away if:  You develop a severe headache or confusion.  You have unusual weakness or numbness.  You feel faint.  You have severe pain in your chest or abdomen.  You vomit repeatedly.  You have trouble breathing. Summary  Hypertension is when the force of blood  pumping through your arteries is too strong. If this condition is not controlled, it may put you at risk for serious complications.  Your personal target blood pressure may vary depending on your medical conditions, your age, and other factors. For most people, a normal blood pressure is less than 120/80.  Hypertension is treated with lifestyle changes, medicines, or a combination of both. Lifestyle changes include weight loss, eating a healthy, low-sodium diet, exercising more, and limiting alcohol. This information is not intended to replace advice given to you by your health care provider. Make sure you discuss any questions you have with your health care provider. Document Released: 10/04/2005 Document Revised: 09/01/2016 Document Reviewed: 09/01/2016 Elsevier Interactive Patient Education  2018 [employer]      Agustina Caroli, MD [facility]

## 2018-07-27 NOTE — Patient Instructions (Addendum)
   If you have lab work done today you will be contacted with your lab results within the next 2 weeks.  If you have not heard from us then please contact us. The fastest way to get your results is to register for My Chart.   IF you received an x-ray today, you will receive an invoice from [facility]. Please contact [facility] at [phone] with questions or concerns regarding your invoice.   IF you received labwork today, you will receive an invoice from [facility]. Please contact [facility] at [phone] with questions or concerns regarding your invoice.   Our billing staff will not be able to assist you with questions regarding bills from these companies.  You will be contacted with the lab results as soon as they are available. The fastest way to get your results is to activate your My Chart account. Instructions are located on the last page of this paperwork. If you have not heard from us regarding the results in 2 weeks, please contact this office.     Hypertension Hypertension, commonly called high blood pressure, is when the force of blood pumping through the arteries is too strong. The arteries are the blood vessels that carry blood from the heart throughout the body. Hypertension forces the heart to work harder to pump blood and may cause arteries to become narrow or stiff. Having untreated or uncontrolled hypertension can cause heart attacks, strokes, kidney disease, and other problems. A blood pressure reading consists of a higher number over a lower number. Ideally, your blood pressure should be below 120/80. The first ("top") number is called the systolic pressure. It is a measure of the pressure in your arteries as your heart beats. The second ("bottom") number is called the diastolic pressure. It is a measure of the pressure in your arteries as the heart relaxes. What are the causes? The cause of this condition is not known. What increases the risk? Some  risk factors for high blood pressure are under your control. Others are not. Factors you can change  Smoking.  Having type 2 diabetes mellitus, high cholesterol, or both.  Not getting enough exercise or physical activity.  Being overweight.  Having too much fat, sugar, calories, or salt (sodium) in your diet.  Drinking too much alcohol. Factors that are difficult or impossible to change  Having chronic kidney disease.  Having a family history of high blood pressure.  Age. Risk increases with age.  Race. You may be at higher risk if you are African-American.  Gender. Men are at higher risk than women before age 45. After age 65, women are at higher risk than men.  Having obstructive sleep apnea.  Stress. What are the signs or symptoms? Extremely high blood pressure (hypertensive crisis) may cause:  Headache.  Anxiety.  Shortness of breath.  Nosebleed.  Nausea and vomiting.  Severe chest pain.  Jerky movements you cannot control (seizures).  How is this diagnosed? This condition is diagnosed by measuring your blood pressure while you are seated, with your arm resting on a surface. The cuff of the blood pressure monitor will be placed directly against the skin of your upper arm at the level of your heart. It should be measured at least twice using the same arm. Certain conditions can cause a difference in blood pressure between your right and left arms. Certain factors can cause blood pressure readings to be lower or higher than normal (elevated) for a short period of time:  When   your blood pressure is higher when you are in a health care provider's office than when you are at home, this is called white coat hypertension. Most people with this condition do not need medicines.  When your blood pressure is higher at home than when you are in a health care provider's office, this is called masked hypertension. Most people with this condition may need medicines to control  blood pressure.  If you have a high blood pressure reading during one visit or you have normal blood pressure with other risk factors:  You may be asked to return on a different day to have your blood pressure checked again.  You may be asked to monitor your blood pressure at home for 1 week or longer.  If you are diagnosed with hypertension, you may have other blood or imaging tests to help your health care provider understand your overall risk for other conditions. How is this treated? This condition is treated by making healthy lifestyle changes, such as eating healthy foods, exercising more, and reducing your alcohol intake. Your health care provider may prescribe medicine if lifestyle changes are not enough to get your blood pressure under control, and if:  Your systolic blood pressure is above 130.  Your diastolic blood pressure is above 80.  Your personal target blood pressure may vary depending on your medical conditions, your age, and other factors. Follow these instructions at home: Eating and drinking  Eat a diet that is high in fiber and potassium, and low in sodium, added sugar, and fat. An example eating plan is called the DASH (Dietary Approaches to Stop Hypertension) diet. To eat this way: ? Eat plenty of fresh fruits and vegetables. Try to fill half of your plate at each meal with fruits and vegetables. ? Eat whole grains, such as whole wheat pasta, brown rice, or whole grain bread. Fill about one quarter of your plate with whole grains. ? Eat or drink low-fat dairy products, such as skim milk or low-fat yogurt. ? Avoid fatty cuts of meat, processed or cured meats, and poultry with skin. Fill about one quarter of your plate with lean proteins, such as fish, chicken without skin, beans, eggs, and tofu. ? Avoid premade and processed foods. These tend to be higher in sodium, added sugar, and fat.  Reduce your daily sodium intake. Most people with hypertension should eat less  than 1,500 mg of sodium a day.  Limit alcohol intake to no more than 1 drink a day for nonpregnant women and 2 drinks a day for men. One drink equals 12 oz of beer, 5 oz of wine, or 1 oz of hard liquor. Lifestyle  Work with your health care provider to maintain a healthy body weight or to lose weight. Ask what an ideal weight is for you.  Get at least 30 minutes of exercise that causes your heart to beat faster (aerobic exercise) most days of the week. Activities may include walking, swimming, or biking.  Include exercise to strengthen your muscles (resistance exercise), such as pilates or lifting weights, as part of your weekly exercise routine. Try to do these types of exercises for 30 minutes at least 3 days a week.  Do not use any products that contain nicotine or tobacco, such as cigarettes and e-cigarettes. If you need help quitting, ask your health care provider.  Monitor your blood pressure at home as told by your health care provider.  Keep all follow-up visits as told by your health care provider.   This is important. Medicines  Take over-the-counter and prescription medicines only as told by your health care provider. Follow directions carefully. Blood pressure medicines must be taken as prescribed.  Do not skip doses of blood pressure medicine. Doing this puts you at risk for problems and can make the medicine less effective.  Ask your health care provider about side effects or reactions to medicines that you should watch for. Contact a health care provider if:  You think you are having a reaction to a medicine you are taking.  You have headaches that keep coming back (recurring).  You feel dizzy.  You have swelling in your ankles.  You have trouble with your vision. Get help right away if:  You develop a severe headache or confusion.  You have unusual weakness or numbness.  You feel faint.  You have severe pain in your chest or abdomen.  You vomit  repeatedly.  You have trouble breathing. Summary  Hypertension is when the force of blood pumping through your arteries is too strong. If this condition is not controlled, it may put you at risk for serious complications.  Your personal target blood pressure may vary depending on your medical conditions, your age, and other factors. For most people, a normal blood pressure is less than 120/80.  Hypertension is treated with lifestyle changes, medicines, or a combination of both. Lifestyle changes include weight loss, eating a healthy, low-sodium diet, exercising more, and limiting alcohol. This information is not intended to replace advice given to you by your health care provider. Make sure you discuss any questions you have with your health care provider. Document Released: 10/04/2005 Document Revised: 09/01/2016 Document Reviewed: 09/01/2016 Elsevier Interactive Patient Education  2018 Elsevier Inc.  

## 2018-07-28 ENCOUNTER — Other Ambulatory Visit: Payer: Self-pay | Admitting: Family Medicine

## 2018-07-28 MED ORDER — RANITIDINE HCL 150 MG PO CAPS: 150.0000 mg | ORAL_CAPSULE | Freq: Every day | ORAL | 1 refills | Status: DC | PRN

## 2018-07-28 NOTE — Telephone Encounter (Signed)
Requested Prescriptions  Pending Prescriptions Disp Refills  . ranitidine (ZANTAC) 150 MG capsule 90 capsule 1    Sig: Take 1 capsule (150 mg total) by mouth daily as needed for heartburn.     Gastroenterology:  H2 Antagonists Passed - 07/28/2018  8:39 AM      Passed - Valid encounter within last 12 months    Recent Outpatient Visits          Yesterday Essential hypertension   Primary Care at [facility], [place], [place]   4 weeks ago BV (bacterial vaginosis)   Primary Care at [facility], [place] Mink, PA-C   1 month ago Left leg pain   Primary Care at [place], PA-C   1 month ago Paresthesia and pain of both upper extremities   [facility] Care at [facility], PA-C   4 months ago Sinusitis, unspecified chronicity, unspecified location   Primary Care at [facility], Gelene Mink, [place]

## 2018-07-28 NOTE — Telephone Encounter (Signed)
Copied from [place] [id number]. Topic: Quick Communication - Rx Refill/Question >> Jul 28, 2018  8:30 AM Scherrie Gerlach wrote: Medication: ranitidine (ZANTAC) 150 MG capsule Pt sates Dr Brigitte Pulse prescribed for her, and she saw Dr Mitchel Honour yesterday and forgot to ask for this med. [place], [place]. [phone] (Phone) [phone] (Fax)

## 2018-07-31 ENCOUNTER — Ambulatory Visit: Payer: BC Managed Care – PPO | Admitting: Physical Therapy

## 2018-07-31 ENCOUNTER — Encounter: Payer: Self-pay | Admitting: Physical Therapy

## 2018-07-31 DIAGNOSIS — M542 Cervicalgia: Secondary | ICD-10-CM | POA: Diagnosis not present

## 2018-07-31 DIAGNOSIS — R208 Other disturbances of skin sensation: Secondary | ICD-10-CM

## 2018-07-31 DIAGNOSIS — M62838 Other muscle spasm: Secondary | ICD-10-CM

## 2018-07-31 NOTE — Therapy (Signed)
[place], [place], [address] Phone: [phone]   Fax:  [phone]  Physical Therapy Treatment  Patient Details  Name: Megan Lang MRN: [id number] Date of Birth: [birth year] Referring Provider (PT): Beaulah Corin   Encounter Date: 07/31/2018  PT End of Session - 07/31/18 1100    Visit Number  11    Number of Visits  13    Date for PT Re-Evaluation  08/02/18    Authorization Type  [employer]    PT Start Time  1017    PT Stop Time  1109    PT Time Calculation (min)  52 min       Past Medical History:  Diagnosis Date  . Acid reflux   . Allergy   . Aortic aneurysm ([place])   . Asthma   . Cardiomegaly   . Carpal tunnel syndrome   . Gastric polyp   . GERD (gastroesophageal reflux disease) 01/23/2016  . Hiatal hernia   . Hypertension   . Normal spontaneous vaginal delivery    3    Past Surgical History:  Procedure Laterality Date  . CARPAL TUNNEL RELEASE  2009  . CARPAL TUNNEL RELEASE Left   . COLONOSCOPY    . NASAL SINUS SURGERY     DEC 15,2016  . ROTATOR CUFF REPAIR Left 04/14/2018    There were no vitals filed for this visit.  Subjective Assessment - 07/31/18 1022    Subjective  Surgery is scheduled 14 November, 2019  anterior fusion neck.  I am going to start new medication for anxiety.   I have been doing the exercises,  working on good posture     Currently in Pain?  Yes    Pain Score  3    varies up to 5/10   Pain Location  Chest    Pain Orientation  Anterior    Pain Radiating Towards  toward neck,, acid reflux    Pain Frequency  Intermittent    Aggravating Factors   laying down    Pain Relieving Factors  sitting up    Effect of Pain on Daily Activities  limited sleeping    Multiple Pain Sites  --   Neck  1/10,   to elbow,  achey,  ice        [facility] PT Assessment - 07/31/18 0001      AROM   Cervical Flexion  --   1 finger width from chest.   no pain   Cervical - Right Side Bend   38   mild end range pain   Cervical - Left Side Bend  40   mild end range pain   Cervical - Right Rotation  64    Cervical - Left Rotation  60                   [facility] Adult PT Treatment/Exercise - 07/31/18 0001      Neck Exercises: Machines for Strengthening   UBE (Upper Arm Bike)  reverse L1      Neck Exercises: Seated   Cervical Rotation  5 reps    Lateral Flexion  5 reps    X to V  10 reps    Other Seated Exercise  total body rotation with thinking tall5 x each  This feels good.       Shoulder Exercises: Seated   Extension  5 reps    Theraband Level (Shoulder Extension)  Level 2 (Red)  Retraction Limitations  10    Row  10 reps    Theraband Level (Shoulder Row)  Level 2 (Red)    External Rotation  10 reps    Internal Rotation  10 reps    Theraband Level (Shoulder Internal Rotation)  Level 2 (Red)    Other Seated Exercises  triceps red band      Cryotherapy   Number Minutes Cryotherapy  10 Minutes    Cryotherapy Location  Cervical    Type of Cryotherapy  --   cold pack            PT Education - 07/31/18 1100    Education Details  exercise form    Person(s) Educated  Patient    Methods  Explanation;Demonstration;Verbal cues    Comprehension  Verbalized understanding;Returned demonstration       PT Short Term Goals - 07/31/18 1101      PT SHORT TERM GOAL #1   Title  pt to be I with inital HEP     Time  3    Period  Weeks    Status  Achieved      PT SHORT TERM GOAL #2   Title  pt to verbalize/ demo proper posture and lifting mechanics to prevent and reduce neck pain    Time  3    Period  Weeks    Status  Achieved      PT SHORT TERM GOAL #3   Title  pt to demonstrate decreased upper trap spasm bil to decrease pain to </= 4/10 and promote cervical mobility     Time  3    Period  Weeks    Status  Achieved        PT Long Term Goals - 07/31/18 1102      PT LONG TERM GOAL #1   Title  pt to incrase cervical flexion/ exension by >/ 8  degrees and sidebending/ rotation by >/= 10 degrees with <= 2/10 pain or report of referral symptoms for safety with driving     Baseline  improving  see flow sheet    Time  6    Period  Weeks    Status  On-going      PT LONG TERM GOAL #2   Title  pt to report numbness/ tingling occurrence in the bil UE happening </= 1 x a week to promote improvement in condition    Baseline  3 x a week    Time  6    Period  Weeks    Status  On-going      PT LONG TERM GOAL #3   Title  pt to return to normal work related activities lifting/ pushing/ pulling as allowed for LUE (RCR) with </=2/10 and no report of N/T in bil UE    Baseline  She is off work until she has neck fusion    Time  6    Period  Weeks    Status  On-going      PT LONG TERM GOAL #4   Title  pt to improve FOTO score to </= 35% limited to demo improvement in function     Time  6    Period  Weeks    Status  Unable to assess      PT LONG TERM GOAL #5   Title  Pt to be I with all HEP given as of last visit to maintain and progress current level of function     Baseline  compliant, independent so far    Time  6    Period  Weeks    Status  On-going              Patient will benefit from skilled therapeutic intervention in order to improve the following deficits and impairments:     Visit Diagnosis: Cervicalgia  Other muscle spasm  Other disturbances of skin sensation     Problem List Patient Active Problem List   Diagnosis Date Noted  . Shoulder impingement syndrome, left 12/19/2017  . Scoliosis (and kyphoscoliosis), idiopathic 07/01/2017  . Chronic bilateral back pain 07/01/2017  . Nexplanon in place 07/29/2016  . Cardiomegaly 07/01/2016  . History of aortic aneurysm 07/01/2016  . GERD (gastroesophageal reflux disease) 01/23/2016  . Dyspnea 10/27/2015  . Costochondritis 08/26/2015  . Obesity 07/01/2015  . Upper airway cough syndrome 06/30/2015  . History of shingles 01/26/2015  . Family history of  colon cancer 10/27/2011  . Essential hypertension 10/27/2011    Marilyne Haseley  PTA 07/31/2018, 11:09 AM  [facility] [address] [place], [place], [address] Phone: [phone]   Fax:  [phone]  Name: Megan Lang MRN: [id number] Date of Birth: Aug 03, 1971

## 2018-08-01 ENCOUNTER — Ambulatory Visit (INDEPENDENT_AMBULATORY_CARE_PROVIDER_SITE_OTHER): Payer: BC Managed Care – PPO | Admitting: Women's Health

## 2018-08-01 ENCOUNTER — Ambulatory Visit (INDEPENDENT_AMBULATORY_CARE_PROVIDER_SITE_OTHER): Payer: BC Managed Care – PPO | Admitting: Psychology

## 2018-08-01 ENCOUNTER — Encounter: Payer: Self-pay | Admitting: Women's Health

## 2018-08-01 VITALS — BP 118/84 | Wt 180.2 lb

## 2018-08-01 DIAGNOSIS — N951 Menopausal and female climacteric states: Secondary | ICD-10-CM | POA: Diagnosis not present

## 2018-08-01 DIAGNOSIS — F411 Generalized anxiety disorder: Secondary | ICD-10-CM | POA: Diagnosis not present

## 2018-08-01 NOTE — Progress Notes (Signed)
47 year old MBF G3, P3 presents to discuss menopausal symptoms, has had some increased anxiety, hot flashes and mood swings..  States has occasional cycles last one in August and then did have some spotting in September.  07/2016 Nexplanon with a regular cycle since placement.  Seen in the ER last week had an ultrasound that showed 2 small less than 2 cm fibroids and a 3 cm simple left ovarian cyst.  Uterus  thin endometrium of 2 mm and questionable adenomyosis.  Reports had a low FSH several weeks ago.  Is seeing a therapist for anxiety and depression.  Had rotator cuff surgery several months ago and is in rehab for that but needs neck surgery that is scheduled next month.  First grade teacher.  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  Reports home life is good, husband been supportive and children are fine.  Hypertension primary care manages.  Exam: Appears well, somewhat anxious.  Perimenopausal/Nexplanon 07/2016 with irregular bleeding Anxiety- primary care and therapist manage Impending neck surgery next month.  Plan: Menopause discussed, reviewed definition of no cycle for 1 year and with low FSH not menopausal at this time.  Questions answered.  Reviewed hormones are fluctuating at this time but best not to introduce hormones since impending surgery as scheduled.  Self-care, leisure activities encouraged.  Questions answered.  Continue counseling therapy and PT. Most of visit was spent counseling and answering questions.

## 2018-08-02 ENCOUNTER — Ambulatory Visit: Payer: BC Managed Care – PPO | Admitting: Physical Therapy

## 2018-08-02 ENCOUNTER — Encounter: Payer: BC Managed Care – PPO | Admitting: Physical Therapy

## 2018-08-07 ENCOUNTER — Telehealth: Payer: Self-pay | Admitting: Physician Assistant

## 2018-08-07 ENCOUNTER — Encounter: Payer: Self-pay | Admitting: Physical Therapy

## 2018-08-07 ENCOUNTER — Ambulatory Visit: Payer: BC Managed Care – PPO | Admitting: Physical Therapy

## 2018-08-07 ENCOUNTER — Ambulatory Visit (INDEPENDENT_AMBULATORY_CARE_PROVIDER_SITE_OTHER): Payer: BC Managed Care – PPO | Admitting: Psychology

## 2018-08-07 DIAGNOSIS — M542 Cervicalgia: Secondary | ICD-10-CM

## 2018-08-07 DIAGNOSIS — F411 Generalized anxiety disorder: Secondary | ICD-10-CM | POA: Diagnosis not present

## 2018-08-07 DIAGNOSIS — R208 Other disturbances of skin sensation: Secondary | ICD-10-CM

## 2018-08-07 DIAGNOSIS — M62838 Other muscle spasm: Secondary | ICD-10-CM

## 2018-08-07 NOTE — Telephone Encounter (Signed)
Pt came in on 08/05/18 to see if we faxed over clearance paperwork that she dropped off to [place] a few weeks ago.. brandi and I looked for it and couldn't find it.. I told pt that I would call and get it refaxed to [place] from [facility] on Monday 10/21. Pt was fine with that also made an appt for pt to see mcvey on Tuesday 10/22 so she can fill out paperwork. I called [facility] and they stated they will be faxing pt clearance paperwork over today 10/21 at the fax upstairs [phone].

## 2018-08-07 NOTE — Telephone Encounter (Signed)
[facility] send over pt clearance form, I placed it in mcvey/wanda box at the clinical station and informed Mariann Laster about it and she placed it in [place] box in the doctor lounge.

## 2018-08-07 NOTE — Therapy (Signed)
[employer] [place], [place], [address] Phone: [phone]   Fax:  [phone]  Physical Therapy Treatment / Discharge Summary   Patient Details  Name: Megan Lang MRN: [id number] Date of Birth: [birth year] Referring Provider (PT): Beaulah Corin   Encounter Date: 08/07/2018  PT End of Session - 08/07/18 0849    Visit Number  12    Number of Visits  13    Date for PT Re-Evaluation  08/02/18    Authorization Type  BCBS    PT Start Time  0846    PT Stop Time  0925    PT Time Calculation (min)  39 min    Activity Tolerance  Patient tolerated treatment well    Behavior During Therapy  [facility] for tasks assessed/performed       Past Medical History:  Diagnosis Date  . Acid reflux   . Allergy   . Aortic aneurysm ([place])   . Asthma   . Cardiomegaly   . Carpal tunnel syndrome   . Gastric polyp   . GERD (gastroesophageal reflux disease) 01/23/2016  . Hiatal hernia   . Hypertension   . Normal spontaneous vaginal delivery    3    Past Surgical History:  Procedure Laterality Date  . CARPAL TUNNEL RELEASE  2009  . CARPAL TUNNEL RELEASE Left   . COLONOSCOPY    . NASAL SINUS SURGERY     DEC 15,2016  . ROTATOR CUFF REPAIR Left 04/14/2018    There were no vitals filed for this visit.  Subjective Assessment - 08/07/18 0849    Subjective  "I am feeling overwhelmed and I have been dx with anxiety. they are going to do fusion on the 08/31/2018"    Patient Stated Goals  decrease the N/T and pain and provide relief,     Currently in Pain?  Yes    Pain Score  0-No pain    Pain Location  Neck    Pain Type  Chronic pain    Pain Onset  More than a month ago    Pain Frequency  Intermittent    Aggravating Factors   laying down,     Pain Relieving Factors  exercise, ice,          [facility] PT Assessment - 08/07/18 0855      Observation/Other Assessments   Focus on Therapeutic Outcomes (FOTO)   48% limited       AROM    Cervical Flexion  40    Cervical Extension  45    Cervical - Right Side Bend  36    Cervical - Left Side Bend  40    Cervical - Right Rotation  70    Cervical - Left Rotation  71                   [facility] Adult PT Treatment/Exercise - 08/07/18 0001      Self-Care   Self-Care  Posture    Posture  reviewed good posture positioning and control to reduce neck pain and using visual cues to remind to sit up straight and keep shoulders down.      Exercises   Exercises  Shoulder;Neck      Neck Exercises: Seated   Neck Retraction  10 reps;5 secs    Other Seated Exercise  cerivcal Snags 1 x 10 bil holding 3 sec ea.  seated thoracic rotation and extension 1 x 10 holding 3 sec    Other  Seated Exercise  scapular retractions 1 x 10 holding 5 sec      Neck Exercises: Stretches   Upper Trapezius Stretch  1 rep;30 seconds;Right;Left    Levator Stretch  1 rep;30 seconds;Right;Left             PT Education - 08/07/18 0913    Education Details  reviewed previously provided HEP. posture review and continued benefit of enforcing good habit of sitting up straight, pulling shoulders down and back.  benefits of continued exercises to [facility] current level of function since she is scheduled to have surgery at 11/14.     Person(s) Educated  Patient    Methods  Explanation;Demonstration;Verbal cues    Comprehension  Verbalized understanding;Verbal cues required       PT Short Term Goals - 08/07/18 0903      PT SHORT TERM GOAL #1   Title  pt to be I with inital HEP     Time  3    Period  Weeks    Status  Achieved      PT SHORT TERM GOAL #2   Title  pt to verbalize/ demo proper posture and lifting mechanics to prevent and reduce neck pain    Time  3    Period  Weeks    Status  Achieved      PT SHORT TERM GOAL #3   Title  pt to demonstrate decreased upper trap spasm bil to decrease pain to </= 4/10 and promote cervical mobility     Period  Weeks    Status  Achieved         PT Long Term Goals - 08/07/18 3151      PT LONG TERM GOAL #1   Title  pt to incrase cervical flexion/ exension by >/ 8 degrees and sidebending/ rotation by >/= 10 degrees with <= 2/10 pain or report of referral symptoms for safety with driving     Baseline  met rotation  numbers, but continues to maintian constant neck fleixon/ extension similar evaluation assessment.     Period  Weeks    Status  Partially Met      PT LONG TERM GOAL #2   Title  pt to report numbness/ tingling occurrence in the bil UE happening </= 1 x a week to promote improvement in condition    Baseline  happens may be 1 x a week N/T and warmth sensation    Status  Achieved      PT LONG TERM GOAL #3   Title  pt to return to normal work related activities lifting/ pushing/ pulling as allowed for LUE ([facility]) with </=2/10 and no report of N/T in bil UE    Baseline  She is off work until she has neck fusion on 11/14    Period  Weeks    Status  Unable to assess      PT LONG TERM GOAL #4   Title  pt to improve FOTO score to </= 35% limited to demo improvement in function     Baseline  48% limitation    Status  Not Met      PT LONG TERM GOAL #5   Title  Pt to be I with all HEP given as of last visit to maintain and progress current level of function     Time  6    Period  Weeks    Status  Achieved            Plan -  08/07/18 0915    Clinical Impression Statement  [name] reported she is scheduled for surgery on 11/14. She has made some progress with cervical rotation but all other ROM has remained the same since previous assessment. She is able to perform all exercises with no increase in pain but report of intermittent tightness in the upper trap and required intermittent cues for proper form with exercises. she met or partially met all goals except for  #4. pt is able to maintain current level of function and will be discharged from PT today.     PT Next Visit Plan  D/C    PT Home Exercise Plan  upper  trap stretch, chin tuck head lift, upper cervical rotation, thoracic rotation, rib mobs, sub-occipital release SNAGs.    Consulted and Agree with Plan of Care  Patient       Patient will benefit from skilled therapeutic intervention in order to improve the following deficits and impairments:  Pain, Decreased activity tolerance, Decreased endurance, Postural dysfunction, Improper body mechanics, Increased fascial restricitons, Decreased strength, Decreased range of motion, Impaired UE functional use  Visit Diagnosis: Cervicalgia  Other muscle spasm  Other disturbances of skin sensation     Problem List Patient Active Problem List   Diagnosis Date Noted  . Shoulder impingement syndrome, left 12/19/2017  . Scoliosis (and kyphoscoliosis), idiopathic 07/01/2017  . Chronic bilateral back pain 07/01/2017  . Nexplanon in place 07/29/2016  . Cardiomegaly 07/01/2016  . History of aortic aneurysm 07/01/2016  . GERD (gastroesophageal reflux disease) 01/23/2016  . Dyspnea 10/27/2015  . Costochondritis 08/26/2015  . Obesity 07/01/2015  . Upper airway cough syndrome 06/30/2015  . History of shingles 01/26/2015  . Family history of colon cancer 10/27/2011  . Essential hypertension 10/27/2011    Megan Lang 08/07/2018, 9:24 AM  [facility] [address] [place], [place], [address] Phone: [phone]   Fax:  [phone]  Name: Megan Lang MRN: [id number] Date of Birth: 1971/02/01       PHYSICAL THERAPY DISCHARGE SUMMARY  Visits from Start of Care: 12  Current functional level related to goals / functional outcomes: See goals, FOTO 48% limited   Remaining deficits: Intermittent muscle spasm in bil upper trap and cervical paraspinals, mild limitation of cervical ROM. See assessment.   Education / Equipment: HEP, theraband, posture,   Plan: Patient agrees to discharge.  Patient goals were partially met. Patient is  being discharged due to                                                     ????? pt is scheduled for cervical fusion on 11/14         Denielle Bayard PT, DPT, LAT, ATC  08/07/18  9:25 AM

## 2018-08-08 ENCOUNTER — Ambulatory Visit (HOSPITAL_COMMUNITY)
Admission: RE | Admit: 2018-08-08 | Discharge: 2018-08-08 | Disposition: A | Discharge status: Home / Self Care | Payer: BC Managed Care – PPO | Attending: Psychiatry | Admitting: Psychiatry

## 2018-08-08 ENCOUNTER — Other Ambulatory Visit: Payer: Self-pay

## 2018-08-08 ENCOUNTER — Telehealth (HOSPITAL_COMMUNITY): Payer: Self-pay | Admitting: Psychiatry

## 2018-08-08 ENCOUNTER — Ambulatory Visit (INDEPENDENT_AMBULATORY_CARE_PROVIDER_SITE_OTHER): Payer: BC Managed Care – PPO | Admitting: Physician Assistant

## 2018-08-08 ENCOUNTER — Encounter: Payer: Self-pay | Admitting: Physician Assistant

## 2018-08-08 VITALS — BP 124/86 | HR 81 | Temp 98.7°F | Resp 16 | Ht 63.0 in | Wt 182.4 lb

## 2018-08-08 DIAGNOSIS — G479 Sleep disorder, unspecified: Secondary | ICD-10-CM | POA: Diagnosis not present

## 2018-08-08 DIAGNOSIS — Z01818 Encounter for other preprocedural examination: Secondary | ICD-10-CM | POA: Diagnosis not present

## 2018-08-08 DIAGNOSIS — Z1330 Encounter for screening examination for mental health and behavioral disorders, unspecified: Secondary | ICD-10-CM | POA: Insufficient documentation

## 2018-08-08 DIAGNOSIS — F411 Generalized anxiety disorder: Secondary | ICD-10-CM | POA: Diagnosis not present

## 2018-08-08 MED ORDER — ALPRAZOLAM 0.25 MG PO TABS: 0.2500 mg | ORAL_TABLET | Freq: Every evening | ORAL | 1 refills | Status: DC | PRN

## 2018-08-08 MED ORDER — ESCITALOPRAM OXALATE 10 MG PO TABS: 20.0000 mg | ORAL_TABLET | Freq: Every day | ORAL | 2 refills | Status: DC

## 2018-08-08 NOTE — Telephone Encounter (Signed)
D:  Sam from TTS referred pt to [place].  A:  Placed call to pt.  She will start tomorrow (08-09-18) at 8:30 a.m.  Contacted pt and oriented her.  R:  Pt receptive.

## 2018-08-08 NOTE — BH Assessment (Signed)
Assessment Note  Megan Lang is a 47 y.o. female in [facility] as a walk in due to insomnia and panic attacks. Pt reports that she's been dealing with this for @ 4 weeks. Pt reports that she has started seeing a therapist (last visit yesterday) and has an appt with a psychiatrist on 11/13th. Pt rec'd a rx to assist with her sleep yesterday. Pt denies SI or HI.  Shuvon Rankin, NP, spoke with pt regarding her medication regimen. Pt is recommended to look into IOP. Pt given information about the program, including contact information. Message also left with [facility] with pt's contact information.    Diagnosis: F41.1 GAD  Past Medical History:  Past Medical History:  Diagnosis Date  . Acid reflux   . Allergy   . Aortic aneurysm ([place])   . Asthma   . Cardiomegaly   . Carpal tunnel syndrome   . Gastric polyp   . GERD (gastroesophageal reflux disease) 01/23/2016  . Hiatal hernia   . Hypertension   . Normal spontaneous vaginal delivery    3    Past Surgical History:  Procedure Laterality Date  . CARPAL TUNNEL RELEASE  2009  . CARPAL TUNNEL RELEASE Left   . COLONOSCOPY    . NASAL SINUS SURGERY     DEC 15,2016  . ROTATOR CUFF REPAIR Left 04/14/2018    Family History:  Family History  Problem Relation Age of Onset  . Hypertension Mother   . Kidney disease Father   . Liver disease Father        Unknown diagnosis  . Breast cancer Maternal Grandmother   . Hypertension Sister   . Hypertension Brother     Social History:  reports that she has never smoked. She has never used smokeless tobacco. She reports that she does not drink alcohol or use drugs.  Additional Social History:  Alcohol / Drug Use Pain Medications: see PTA meds Prescriptions: see PTA meds Over the Counter: see PTA meds History of alcohol / drug use?: No history of alcohol / drug abuse  CIWA:   COWS:    Allergies:  Allergies  Allergen Reactions  . Moxifloxacin Swelling    other  . Clindamycin Diarrhea and Other  (See Comments)    GI upset  . Codeine Other (See Comments)    TACHYCARDIA   . Levofloxacin Other (See Comments)    other  . Metronidazole Diarrhea  . Minocin [Minocycline Hcl] Other (See Comments)    Stomach Pains  . Penicillins Rash    Has patient had a PCN reaction causing immediate rash, facial/tongue/throat swelling, SOB or lightheadedness with hypotension:yes Has patient had a PCN reaction causing severe rash involving mucus membranes or skin necrosis:no Has patient had a PCN reaction that required hospitalization:no Has patient had a PCN reaction occurring within the last 10 years:No If all of the above answers are "NO", then may proceed with Cephalosporin    Home Medications:  (Not in a hospital admission)  OB/GYN Status:  No LMP recorded. Patient has had an implant.  General Assessment Data Location of Assessment: [facility] Assessment Services TTS Assessment: In system Is this a Tele or Face-to-Face Assessment?: Face-to-Face Is this an Initial Assessment or a Re-assessment for this encounter?: Initial Assessment Patient Accompanied by:: N/A Language Other than English: No Living Arrangements: Other (Comment) What gender do you identify as?: Female Marital status: Married Living Arrangements: Spouse/significant other Can pt return to current living arrangement?: Yes Admission Status: Voluntary Is patient capable of  signing voluntary admission?: Yes Referral Source: Self/Family/Friend  Medical Screening Exam ([place]) Medical Exam completed: Yes  Crisis Care Plan Living Arrangements: Spouse/significant other Name of Psychiatrist: none-appt on 11/13 w/ [facility] Name of Therapist: Dennison Bulla  Education Status Is patient currently in school?: No Is the patient employed, unemployed or receiving disability?: Employed  Risk to self with the past 6 months Suicidal Ideation: No Has patient been a risk to self within the past 6 months prior to admission?  : No Suicidal Intent: No Has patient had any suicidal intent within the past 6 months prior to admission? : No Is patient at risk for suicide?: No Suicidal Plan?: No Has patient had any suicidal plan within the past 6 months prior to admission? : No Access to Means: No Previous Attempts/Gestures: No Intentional Self Injurious Behavior: None Family Suicide History: No Recent stressful life event(s): Recent negative physical changes Persecutory voices/beliefs?: No Depression: Yes Depression Symptoms: Insomnia, Tearfulness, Feeling angry/irritable, Feeling worthless/self pity Substance abuse history and/or treatment for substance abuse?: No Suicide prevention information given to non-admitted patients: Not applicable  Risk to Others within the past 6 months Homicidal Ideation: No Does patient have any lifetime risk of violence toward others beyond the six months prior to admission? : No Thoughts of Harm to Others: No Current Homicidal Intent: No Current Homicidal Plan: No Access to Homicidal Means: No History of harm to others?: No Assessment of Violence: None Noted Does patient have access to weapons?: No Criminal Charges Pending?: No Does patient have a court date: No Is patient on probation?: No  Psychosis Hallucinations: None noted, Auditory(Pt reports hearing birds after having a panic attack) Delusions: None noted  Mental Status Report Appearance/Hygiene: Unremarkable Eye Contact: Good Motor Activity: Unremarkable Speech: Logical/coherent Level of Consciousness: Alert Mood: Pleasant, Euthymic Affect: Appropriate to circumstance Anxiety Level: Minimal Thought Processes: Coherent, Relevant Judgement: Unimpaired Orientation: Person, Place, Time, Situation Obsessive Compulsive Thoughts/Behaviors: None  Cognitive Functioning Concentration: Normal Memory: Recent Intact, Remote Intact Is patient IDD: No Insight: Fair Impulse Control: Good Appetite: Good Have you  had any weight changes? : No Change Sleep: Decreased Total Hours of Sleep: 2 Vegetative Symptoms: None  ADLScreening [facility] Assessment Services) Patient's cognitive ability adequate to safely complete daily activities?: Yes Patient able to express need for assistance with ADLs?: Yes Independently performs ADLs?: Yes (appropriate for developmental age)  Prior Inpatient Therapy Prior Inpatient Therapy: No  Prior Outpatient Therapy Prior Outpatient Therapy: No Does patient have an ACCT team?: No Does patient have Intensive In-House Services?  : No Does patient have [facility] services? : No Does patient have P4CC services?: No  ADL Screening (condition at time of admission) Patient's cognitive ability adequate to safely complete daily activities?: Yes Is the patient deaf or have difficulty hearing?: No Does the patient have difficulty seeing, even when wearing glasses/contacts?: No Does the patient have difficulty concentrating, remembering, or making decisions?: No Patient able to express need for assistance with ADLs?: Yes Does the patient have difficulty dressing or bathing?: No Independently performs ADLs?: Yes (appropriate for developmental age) Does the patient have difficulty walking or climbing stairs?: No Weakness of Legs: None Weakness of Arms/Hands: None  Home Assistive Devices/Equipment Home Assistive Devices/Equipment: None    Abuse/Neglect Assessment (Assessment to be complete while patient is alone) Abuse/Neglect Assessment Can Be Completed: Yes Physical Abuse: Denies Verbal Abuse: Denies Sexual Abuse: Denies Exploitation of patient/patient's resources: Denies Self-Neglect: Denies     Regulatory affairs officer (For Healthcare) Does Patient Have a  Medical Advance Directive?: No          Disposition:  Disposition Initial Assessment Completed for this Encounter: Yes Disposition of Patient: Discharge Mode of transportation if patient is discharged?: Car Patient  referred to: Other (Comment)([facility] Intensive Outpatient program)  On Site Evaluation by:   Reviewed with Physician:    Rexene Edison 08/08/2018 3:06 PM

## 2018-08-08 NOTE — Progress Notes (Signed)
Megan Lang  MRN: [id number] DOB: 1971/02/05  PCP: Shawnee Knapp, MD  Subjective:  Pt is a 47 year old female who presents to clinic for pre op clearance and anxiety.  She plans to have anterior cervical decompression on Nov 14 with Dr. Rolena Infante at [facility]  1. Do you usually get chest pain or breathlessness when you climb up two flights of stairs at normal speed? No   2. Do you have kidney disease? No   3. Has anyone in your family (blood relatives) had a problem following an anaesthetic? No   4. Have you ever had a heart attack? No   5. Have you ever been diagnosed with an irregular heartbeat? No   6. Have you ever had a stroke? No   7. If you have been put to sleep for an operation were there any anaesthetic problems? No   8. Do you suffer from epilepsy or seizures? No  9. Do you have any problems with pain, stiffness or arthritis in your neck or jaw? No   10. Do you have thyroid disease? No   11. Do you suffer from angina? No  12. Do you have liver disease? No   13. Have you ever been diagnosed with heart failure? No   14. Do you suffer from asthma? No   15. Do you have diabetes that requires insulin? No   16. Do you have diabetes that requires tablets only? No   17. Do you suffer from bronchitis? No    Lab Results  Component Value Date   WBC 6.6 07/23/2018   HGB 14.1 07/23/2018   HCT 41.5 07/23/2018   MCV 88.9 07/23/2018   PLT 288 07/23/2018   Lab Results  Component Value Date   CREATININE 0.64 07/23/2018   BUN 11 07/23/2018   NA 138 07/23/2018   K 3.5 07/23/2018   CL 105 07/23/2018   CO2 24 07/23/2018   Anxiety - diagnosed with anxiety at the ED on 10/06. She is currently seeing therapist. Taking lexapro 10mg  qd and buspar bid. trazadone 50mg  q hs is not working. Lexapro is not working. She has noticed difference in symptoms. Denies SI or HI.  She has never been treated for this in the past.   Review of Systems  Respiratory: Negative for cough,  chest tightness, shortness of breath and wheezing.   Cardiovascular: Negative for chest pain, palpitations and leg swelling.  Psychiatric/Behavioral: Positive for dysphoric mood. Negative for self-injury and suicidal ideas. The patient is nervous/anxious.     Patient Active Problem List   Diagnosis Date Noted  . Shoulder impingement syndrome, left 12/19/2017  . Scoliosis (and kyphoscoliosis), idiopathic 07/01/2017  . Chronic bilateral back pain 07/01/2017  . Nexplanon in place 07/29/2016  . Cardiomegaly 07/01/2016  . History of aortic aneurysm 07/01/2016  . GERD (gastroesophageal reflux disease) 01/23/2016  . Dyspnea 10/27/2015  . Costochondritis 08/26/2015  . Obesity 07/01/2015  . Upper airway cough syndrome 06/30/2015  . History of shingles 01/26/2015  . Family history of colon cancer 10/27/2011  . Essential hypertension 10/27/2011    Current Outpatient Medications on File Prior to Visit  Medication Sig Dispense Refill  . acetaminophen (TYLENOL) 500 MG tablet Take 500 mg by mouth every 4 (four) hours as needed for moderate pain.     Marland Kitchen albuterol (PROVENTIL HFA;VENTOLIN HFA) 108 (90 Base) MCG/ACT inhaler Inhale 1-2 puffs into the lungs every 6 (six) hours as needed for wheezing or shortness of breath. 1  Inhaler 0  . amLODipine (NORVASC) 5 MG tablet Take 1 tablet (5 mg total) by mouth daily. 90 tablet 1  . bisoprolol-hydrochlorothiazide (ZIAC) 5-6.25 MG tablet Take 1 tablet by mouth daily. 90 tablet 1  . busPIRone (BUSPAR) 15 MG tablet Take 15 mg by mouth every 8 (eight) hours as needed.  0  . docusate sodium (COLACE) 100 MG capsule Take 1 capsule (100 mg total) by mouth every 12 (twelve) hours. 60 capsule 0  . etonogestrel (NEXPLANON) 68 MG IMPL implant 1 each by Subdermal route once.    . fluticasone (FLONASE) 50 MCG/ACT nasal spray Place 1 spray into the nose daily as needed for allergies.     Marland Kitchen ibuprofen (ADVIL,MOTRIN) 600 MG tablet Take 1 tablet (600 mg total) by mouth every 6  (six) hours as needed. 30 tablet 0  . meloxicam (MOBIC) 15 MG tablet Take 1 tablet (15 mg total) by mouth daily. 30 tablet 1  . Multiple Vitamins-Minerals (MULTIVITAMIN GUMMIES ADULT PO) Take 2 each by mouth daily.    . pantoprazole (PROTONIX) 40 MG tablet TAKE 1 TABLET BY MOUTH ONCE DAILY FOR REFLUX  5  . ranitidine (ZANTAC) 150 MG capsule Take 1 capsule (150 mg total) by mouth daily as needed for heartburn. 90 capsule 1  . azelastine (ASTELIN) 0.1 % nasal spray Place 2 sprays into both nostrils 2 (two) times daily. Use in each nostril as directed (Patient not taking: Reported on 08/08/2018) 30 mL 0  . cyclobenzaprine (FLEXERIL) 10 MG tablet Take 0.5-1 tablets (5-10 mg total) by mouth 3 (three) times daily as needed. (Patient not taking: Reported on 08/08/2018) 30 tablet 0  . escitalopram (LEXAPRO) 10 MG tablet Take 10 mg by mouth daily.  0  . naproxen (NAPROSYN) 500 MG tablet Take 1 tablet (500 mg total) by mouth 2 (two) times daily with a meal. (Patient not taking: Reported on 08/01/2018) 30 tablet 0  . potassium chloride SA (K-DUR,KLOR-CON) 20 MEQ tablet Take 2 tablets (40 mEq total) by mouth 2 (two) times daily. (Patient not taking: Reported on 07/27/2018) 20 tablet 0  . sodium chloride (OCEAN) 0.65 % SOLN nasal spray Place 1 spray into both nostrils daily.     No current facility-administered medications on file prior to visit.     Allergies  Allergen Reactions  . Moxifloxacin Swelling    other  . Clindamycin Diarrhea and Other (See Comments)    GI upset  . Codeine Other (See Comments)    TACHYCARDIA   . Levofloxacin Other (See Comments)    other  . Metronidazole Diarrhea  . Minocin [Minocycline Hcl] Other (See Comments)    Stomach Pains  . Penicillins Rash    Has patient had a PCN reaction causing immediate rash, facial/tongue/throat swelling, SOB or lightheadedness with hypotension:yes Has patient had a PCN reaction causing severe rash involving mucus membranes or skin  necrosis:no Has patient had a PCN reaction that required hospitalization:no Has patient had a PCN reaction occurring within the last 10 years:No If all of the above answers are "NO", then may proceed with Cephalosporin     Objective:  BP 124/86 (BP Location: Left Arm, Patient Position: Sitting, Cuff Size: Normal)   Pulse 81   Temp 98.7 F (37.1 C) (Oral)   Resp 16   Ht 5\' 3"  (1.6 m)   Wt 182 lb 6.4 oz (82.7 kg)   SpO2 100%   BMI 32.31 kg/m   Physical Exam  Constitutional: She is oriented to person, place, and  time. She appears well-developed and well-nourished. No distress.  Cardiovascular: Normal rate, regular rhythm and normal heart sounds.  Neurological: She is alert and oriented to person, place, and time.  Skin: Skin is warm and dry.  Psychiatric: Her speech is normal and behavior is normal. Judgment normal. Her mood appears anxious.  Vitals reviewed.   Assessment and Plan :  1. Anxiety state - Pt presents c/o anxiety. Plan to increase Lexapro to 20mg . Increase trazadone to 150-200mg  qhs. If no improvement may start xanax at night before bed. RTC in 3 weeks to recheck.  - escitalopram (LEXAPRO) 10 MG tablet; Take 2 tablets (20 mg total) by mouth daily.  Dispense: 60 tablet; Refill: 2 - ALPRAZolam (XANAX) 0.25 MG tablet; Take 1-2 tablets (0.25-0.5 mg total) by mouth at bedtime as needed for anxiety or sleep.  Dispense: 40 tablet; Refill: 1  2. Difficulty sleeping - ALPRAZolam (XANAX) 0.25 MG tablet; Take 1-2 tablets (0.25-0.5 mg total) by mouth at bedtime as needed for anxiety or sleep.  Dispense: 40 tablet; Refill: 1  3. Preoperative clearance - Cleared for surgery as long as anxiety is under control.    Mercer Pod, PA-C  [facility] [place] 08/08/2018 5:53 PM  Please note: Portions of this report may have been transcribed using dragon voice recognition software. Every effort was made to ensure accuracy; however, inadvertent  computerized transcription errors may be present.

## 2018-08-08 NOTE — H&P (Signed)
Behavioral Health Medical Screening Exam  Megan Lang is an 47 y.o. female patient presents as walk in at [facility] with complaints of worsening anxiety and depression.  States she is not sleeping.  Patient is seeing a therapist but no psychiatrist at this current time.  Patient states that she is out of work on medical leave right now and is feeling overwhelmed related to several medical issues that has occurred.  Patient denies suicidal/self-harm/homicidal ideation and paranoia.  States she has heard birds sing during the night while in bed and has sat up to see where the noise is coming from.  Patient states that she lives with her husband and feels safe at home and that she would not do anything to harm herself related to not wanting to hurt her husband or children  Total Time spent with patient: 30 minutes  Psychiatric Specialty Exam: Physical Exam  Vitals reviewed. Constitutional: She is oriented to person, place, and time. She appears well-developed and well-nourished.  HENT:  Head: Normocephalic.  Neck: Normal range of motion. Neck supple.  Respiratory: Effort normal.  Musculoskeletal: Normal range of motion.  Neurological: She is alert and oriented to person, place, and time.  Skin: Skin is warm and dry.  Psychiatric: Her speech is normal and behavior is normal. Judgment and thought content normal. Her mood appears anxious (Stable). Cognition and memory are normal. She exhibits a depressed mood (Stable).    Review of Systems  Psychiatric/Behavioral: Positive for depression (Stable). Hallucinations: States she has heard birds singing at night. Substance abuse: Denies. Suicidal ideas: Denies. The patient is nervous/anxious and has insomnia (States she is not sleeping ).   All other systems reviewed and are negative.   Blood pressure 135/89, pulse 84, temperature 98.6 F (37 C), resp. rate 16, SpO2 100 %.There is no height or weight on file to calculate BMI.  General Appearance:  Casual  Eye Contact:  Good  Speech:  Clear and Coherent and Normal Rate  Volume:  Normal  Mood:  Anxious and Depressed  Affect:  Appropriate and Congruent  Thought Process:  Goal Directed  Orientation:  Full (Time, Place, and Person)  Thought Content:  WDL and Logical  Suicidal Thoughts:  No  Homicidal Thoughts:  No  Memory:  Immediate;   Good Recent;   Good Remote;   Good  Judgement:  Intact  Insight:  Good  Psychomotor Activity:  Normal  Concentration: Concentration: Good and Attention Span: Good  Recall:  Good  Fund of Knowledge:Good  Language: Good  Akathisia:  No  Handed:  Right  AIMS (if indicated):     Assets:  Communication Skills Desire for Improvement Housing Intimacy Social Support  Sleep:       Musculoskeletal: Strength & Muscle Tone: within normal limits Gait & Station: normal Patient leans: N/A  Blood pressure 135/89, pulse 84, temperature 98.6 F (37 C), resp. rate 16, SpO2 100 %.  Recommendations:  Outpatient psychiatric services  Disposition:  Referred to IOP No evidence of imminent risk to self or others at present.   Patient does not meet criteria for psychiatric inpatient admission. Supportive therapy provided about ongoing stressors. Discussed crisis plan, support from social network, calling 911, coming to the Emergency Department, and calling Suicide Hotline.  Based on my evaluation the patient does not appear to have an emergency medical condition.  Rebeccah Ivins, NP 08/08/2018, 3:08 PM

## 2018-08-08 NOTE — Patient Instructions (Addendum)
It may take Lexapro a week or two to take effect. Continue taking Lexapro 10 mg once daily (morning or evening, with or without food). After 1 more week you can increase the dose to 52m if you feel like you need it.    (Please know you need to take this medication every day. Do not miss a dose. Plan to continue taking this medication at least 12 months. When you and your medical provider decide it's appropriate, you will need to taper off this medication.). Common side effects include, but are not limited to, GI upset, nausea, vomiting, insomnia, and drowsiness. Typically these side effects will resolve after 2 weeks.  Trazadone - increase the dose by 221m(1/2 pill) every 4-5 nights until either you sleep well, have side effects or reach a nightly dose of 15065mYou can take up to 300m45mght of Trazadone.   If Trazadone is not working, try xanax at night before bed to help your mind stop swirling. Xanax 0.25-0.5mg 36mmin before bed.   You can stop taking Buspar.   Come back and see me Nov 6 to recheck.     RELAXATION Consider practicing mindfulness meditation or other relaxation techniques such as deep breathing, prayer, yoga, tai chi, massage. See website mindful.org or the apps Headspace or Calm to help get started.   SLEEP  Regular exercise and reduced caffeine will help you sleep better. Practice good sleep hygeine techniques. See website sleep.org for more information.    You can call the 24-hour [phone] assistance. Among several different types of services, they offer an Intensive oupatient program for mood disorders - which is a group type setting Monday-Friday 9-noon. You can schedule an assessment by calling the above numbers during which the costs for the program and insurance benefits will be reviewed.    You can always go to [facility] suicidal or there are walk-in crisis services available  at: [phone] [facility] operations: Monday-Friday, 8:30-5 p.m.  They take medicare/medicaid and the patient can contact [facility] at [phone]_0 .org for more information.  Thank you for coming in today. I hope you feel we met your needs.  Feel free to call PCP if you have any questions or further requests.  Please consider signing up for MyChart if you do not already have it, as this is a great way to communicate with me.  Best,  [employer]

## 2018-08-09 ENCOUNTER — Other Ambulatory Visit (HOSPITAL_COMMUNITY): Payer: BC Managed Care – PPO | Attending: Psychiatry | Admitting: Licensed Clinical Social Worker

## 2018-08-09 ENCOUNTER — Encounter: Payer: BC Managed Care – PPO | Admitting: Physical Therapy

## 2018-08-09 ENCOUNTER — Ambulatory Visit: Payer: BC Managed Care – PPO | Admitting: Obstetrics & Gynecology

## 2018-08-09 ENCOUNTER — Ambulatory Visit: Payer: BC Managed Care – PPO | Admitting: Physical Therapy

## 2018-08-09 ENCOUNTER — Encounter (HOSPITAL_COMMUNITY): Payer: Self-pay | Admitting: Psychiatry

## 2018-08-09 DIAGNOSIS — F32A Depression, unspecified: Secondary | ICD-10-CM

## 2018-08-09 DIAGNOSIS — Z79899 Other long term (current) drug therapy: Secondary | ICD-10-CM | POA: Insufficient documentation

## 2018-08-09 DIAGNOSIS — M4802 Spinal stenosis, cervical region: Secondary | ICD-10-CM | POA: Insufficient documentation

## 2018-08-09 DIAGNOSIS — F329 Major depressive disorder, single episode, unspecified: Secondary | ICD-10-CM | POA: Insufficient documentation

## 2018-08-09 DIAGNOSIS — K219 Gastro-esophageal reflux disease without esophagitis: Secondary | ICD-10-CM | POA: Insufficient documentation

## 2018-08-09 DIAGNOSIS — I10 Essential (primary) hypertension: Secondary | ICD-10-CM | POA: Diagnosis not present

## 2018-08-09 DIAGNOSIS — Z8249 Family history of ischemic heart disease and other diseases of the circulatory system: Secondary | ICD-10-CM | POA: Diagnosis not present

## 2018-08-09 DIAGNOSIS — Z7951 Long term (current) use of inhaled steroids: Secondary | ICD-10-CM | POA: Diagnosis not present

## 2018-08-09 DIAGNOSIS — G8929 Other chronic pain: Secondary | ICD-10-CM | POA: Diagnosis not present

## 2018-08-09 DIAGNOSIS — F418 Other specified anxiety disorders: Secondary | ICD-10-CM | POA: Diagnosis not present

## 2018-08-09 MED ORDER — BUSPIRONE HCL 15 MG PO TABS: 15.0000 mg | ORAL_TABLET | Freq: Two times a day (BID) | ORAL | 0 refills | Status: AC
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
## 2018-08-10 ENCOUNTER — Ambulatory Visit (HOSPITAL_COMMUNITY): Payer: BC Managed Care – PPO

## 2018-08-10 ENCOUNTER — Telehealth (HOSPITAL_COMMUNITY): Payer: Self-pay | Admitting: Psychiatry

## 2018-08-10 ENCOUNTER — Other Ambulatory Visit (HOSPITAL_COMMUNITY): Payer: BC Managed Care – PPO | Admitting: Psychiatry

## 2018-08-11 ENCOUNTER — Encounter (HOSPITAL_COMMUNITY): Payer: Self-pay | Admitting: Family

## 2018-08-11 ENCOUNTER — Telehealth (HOSPITAL_COMMUNITY): Payer: Self-pay | Admitting: Psychiatry

## 2018-08-11 ENCOUNTER — Ambulatory Visit (HOSPITAL_COMMUNITY): Payer: BC Managed Care – PPO

## 2018-08-11 ENCOUNTER — Other Ambulatory Visit (HOSPITAL_COMMUNITY): Payer: BC Managed Care – PPO | Admitting: Psychiatry

## 2018-08-14 ENCOUNTER — Ambulatory Visit: Payer: BC Managed Care – PPO | Admitting: Psychology

## 2018-08-14 ENCOUNTER — Other Ambulatory Visit (HOSPITAL_COMMUNITY): Payer: BC Managed Care – PPO | Admitting: Licensed Clinical Social Worker

## 2018-08-14 ENCOUNTER — Ambulatory Visit: Payer: BC Managed Care – PPO | Admitting: Physical Therapy

## 2018-08-14 DIAGNOSIS — F329 Major depressive disorder, single episode, unspecified: Secondary | ICD-10-CM

## 2018-08-14 DIAGNOSIS — F32A Depression, unspecified: Secondary | ICD-10-CM

## 2018-08-15 ENCOUNTER — Other Ambulatory Visit (HOSPITAL_COMMUNITY): Payer: BC Managed Care – PPO | Admitting: Licensed Clinical Social Worker

## 2018-08-15 ENCOUNTER — Ambulatory Visit (HOSPITAL_COMMUNITY): Payer: BC Managed Care – PPO

## 2018-08-15 DIAGNOSIS — F329 Major depressive disorder, single episode, unspecified: Secondary | ICD-10-CM | POA: Diagnosis not present

## 2018-08-15 DIAGNOSIS — F32A Depression, unspecified: Secondary | ICD-10-CM

## 2018-08-15 NOTE — Progress Notes (Signed)
    Daily Group Progress Note  Program: IOP  Group Time: 9am-12pm  Participation Level: Active  Behavioral Response: Appropriate, Sharing and Motivated  Type of Therapy:  Group Therapy psychoeducational group, process group  Summary of Progress:  The purpose of this group is to learn and implement CBT and DBT skills in a group setting in order to increase utilization of healthy coping skills to decrease active mental health symptoms.  9am-10:30am Clinician checked in with clients, assessing for SI/HI/psychosis. Clinician reviewed self care activity and inquired about any skills used the previous day and their effectiveness. Clinician provided icebreaker activity '10 minutes to let your mind wander.' Clinician and group members discussed answers. Clinician provided Guided Visualization Meditation activity for client participation. Clinician facilitated discussion on benefits and difficulties of using this skill. Clinician challenged clients to identify specific feelings behind behaviors and label related feeling. Clinician reminded clients of CBT triangle and connection of thoughts, emotions, and behaviors.  10:30am-12pm Clinician presented DBT House and started discussing sections with clients including values, hidden parts of self, behaviors/life changes desired, and feelings to feel more often or in a healthy way. Activity will be completed during [place] group. Clinician facilitated discussion with clients about consequences of behaviors not reflecting values. Clinician provided clients with Distress Tolerance Skill TIPP: Changing Your Body Chemistry, to provide quick, short term relief for intense, overwhelming emotions. Clinician explained each part of the skill including 5-7-9 breathing and practiced in session progressive muscle relaxation.  Clinician inquired about self care activity planned for the rest of the day. Client reports feeling anxious yesterday and today believes it was due  to being around lots of people. Client was receptive to challenges about identifying specific thoughts. Client reports she spoke with provider about continued sleep trouble. Client reports she was able to follow up with some of the information provided in group yesterday related to sleep hygiene and found some of it helpful. Client agreed that sharing difficult uncomfortable feelings with others was difficult due to not wanting to be perceived as weak. Client notes she will be able to set a boundary next year at school with what she is able to do, and was able to create a more helpful thought in group related to setting a boundary without guilt. Client reports guided visualization was not very helpful but is interested in trying it again with a beach scene, which she identified as her happy place. Client did participated in all group discussions and activities and was able to manager symptoms of anxiety in group by handelling a fidget throughout part of group.   Olegario Messier, LCSW

## 2018-08-16 ENCOUNTER — Other Ambulatory Visit (HOSPITAL_COMMUNITY): Payer: BC Managed Care – PPO | Admitting: Licensed Clinical Social Worker

## 2018-08-16 ENCOUNTER — Ambulatory Visit: Payer: BC Managed Care – PPO | Admitting: Physical Therapy

## 2018-08-16 DIAGNOSIS — F329 Major depressive disorder, single episode, unspecified: Secondary | ICD-10-CM | POA: Diagnosis not present

## 2018-08-16 DIAGNOSIS — F32A Depression, unspecified: Secondary | ICD-10-CM

## 2018-08-17 ENCOUNTER — Encounter: Payer: Self-pay | Admitting: Obstetrics & Gynecology

## 2018-08-17 ENCOUNTER — Telehealth (HOSPITAL_COMMUNITY): Payer: Self-pay | Admitting: Psychiatry

## 2018-08-17 ENCOUNTER — Other Ambulatory Visit (HOSPITAL_COMMUNITY): Payer: BC Managed Care – PPO

## 2018-08-17 ENCOUNTER — Ambulatory Visit (HOSPITAL_COMMUNITY): Payer: BC Managed Care – PPO

## 2018-08-17 ENCOUNTER — Ambulatory Visit (INDEPENDENT_AMBULATORY_CARE_PROVIDER_SITE_OTHER): Payer: BC Managed Care – PPO | Admitting: Obstetrics & Gynecology

## 2018-08-17 VITALS — BP 124/76

## 2018-08-17 DIAGNOSIS — N83202 Unspecified ovarian cyst, left side: Secondary | ICD-10-CM | POA: Diagnosis not present

## 2018-08-17 DIAGNOSIS — R102 Pelvic and perineal pain: Secondary | ICD-10-CM

## 2018-08-17 NOTE — Progress Notes (Signed)
    Megan Lang 1971/08/30 [id number]        47 y.o.  G3P3L3  Married.  Has 3 daughters  RP: F/U Pelvic Pain with Lt Ovarian Cyst on Pelvic US  HPI: On Nexplanon x 07/2016.  Menses 08/14/2018 normal.  Presented to the ER for Left Pelvic pain on July 22, 2018.  She was treated for bladder spasm and given antibiotics to take if the pain got worse given her history of diverticulitis.  She had no fever.  At 3.1 cm simple cyst was seen by ultrasound on the left ovary, she was therefore told to follow-up with her gynecologist.  Improved pain since that time.  No pelvic pain today.  No abnormal vaginal discharge.   OB History  Gravida Para Term Preterm AB Living  3 3 3     3   SAB TAB Ectopic Multiple Live Births          3    # Outcome Date GA Lbr Len/2nd Weight Sex Delivery Anes PTL Lv  3 Term     F Vag-Spont  N LIV  2 Term     F Vag-Spont  N LIV  1 Term     F Vag-Spont  N LIV    Past medical history,surgical history, problem list, medications, allergies, family history and social history were all reviewed and documented in the EPIC chart.   Directed ROS with pertinent positives and negatives documented in the history of present illness/assessment and plan.  Exam:  Vitals:   08/17/18 1555  BP: 124/76   General appearance:  Normal  Abdomen: Soft nontender.  Gynecologic exam: Vulva normal.  Uterus anteverted, normal volume, nontender and mobile.  No adnexal mass seen, nontender bilaterally.  Pelvic US 07/22/2018: Uterus Measurements: 9.4 x 5.0 x 5.8 cm. 1.9 cm right fundal fibroid. 2.0 cm posterior body fibroid. Diffusely heterogeneous echotexture. Endometrium Thickness: 2 mm in thickness.  No focal abnormality Right ovary Measurements: 2.7 x 1.9 x 1.6 cm. Normal appearance. No adnexal mass. Left ovary Measurements: 3.4 x 2.8 x 3.3 cm.  3.1 cm simple appearing cyst. Pulsed Doppler evaluation of both ovaries demonstrates normal low-resistance arterial and venous waveforms. Small  amount of free fluid in the pelvis.  Assessment/Plan:  47 y.o. G3P3003   1. Left ovarian cyst Left simple ovarian cyst measuring 3.1 cm, probable functional cyst.  No evidence of torsion on pelvic ultrasound.  Improved pain since the ultrasound on July 22, 2018.  Continue with Nexplanon.  Will reevaluate by ultrasound in December.  Precautions discussed with patient. - US Transvaginal Non-OB; Future  2. Pelvic pain in female Resolved left pelvic pain.  Normal gynecologic exam today. - US Transvaginal Non-OB; Future  Counseling on above issues and coordination of care more than 50% for 25 minutes.  Princess Bruins MD, 4:03 PM 08/17/2018

## 2018-08-18 ENCOUNTER — Other Ambulatory Visit (HOSPITAL_COMMUNITY): Payer: BC Managed Care – PPO | Attending: Psychiatry | Admitting: Licensed Clinical Social Worker

## 2018-08-18 ENCOUNTER — Ambulatory Visit (HOSPITAL_COMMUNITY): Payer: BC Managed Care – PPO

## 2018-08-18 DIAGNOSIS — F329 Major depressive disorder, single episode, unspecified: Secondary | ICD-10-CM | POA: Insufficient documentation

## 2018-08-18 DIAGNOSIS — F419 Anxiety disorder, unspecified: Secondary | ICD-10-CM | POA: Insufficient documentation

## 2018-08-18 DIAGNOSIS — F32A Depression, unspecified: Secondary | ICD-10-CM

## 2018-08-19 ENCOUNTER — Encounter: Payer: Self-pay | Admitting: Obstetrics & Gynecology

## 2018-08-19 NOTE — Patient Instructions (Signed)
1. Left ovarian cyst Left simple ovarian cyst measuring 3.1 cm, probable functional cyst.  No evidence of torsion on pelvic ultrasound.  Improved pain since the ultrasound on July 22, 2018.  Continue with Nexplanon.  Will reevaluate by ultrasound in December.  Precautions discussed with patient. - US Transvaginal Non-OB; Future  2. Pelvic pain in female Resolved left pelvic pain.  Normal gynecologic exam today. - US Transvaginal Non-OB; Future  Sanai, good seeing you today!

## 2018-08-21 ENCOUNTER — Other Ambulatory Visit (HOSPITAL_COMMUNITY): Payer: BC Managed Care – PPO | Admitting: Licensed Clinical Social Worker

## 2018-08-21 DIAGNOSIS — F329 Major depressive disorder, single episode, unspecified: Secondary | ICD-10-CM

## 2018-08-21 DIAGNOSIS — F32A Depression, unspecified: Secondary | ICD-10-CM
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
## 2018-08-21 NOTE — Progress Notes (Signed)
    Daily Group Progress Note  Program: IOP  Group Time: 9am-12pm  Participation Level: Active  Behavioral Response: Appropriate  Type of Therapy:  Group Therapy; process group, psycho-educational group  Summary of Progress:  The purpose of this group is to learn CBT and DBT skills in a group setting to utilize healthy coping skills and decrease active mental health symptoms.  9am-10:30am Clinician checked in with group members, assessing for SI/HI/psychosis. Clinician and group members reviewed distorted thinking styles. Clinician and group members reviewed examples of each and practiced creating more helpful or realistic alternative thought. Clinician and group members reviewed Ruminating Thought Worksheet. Clinician provided ways to clients to track ruminating episodes by identifying specific thoughts and feelings.  73:30am-12pm Clinician and group members discussed skills for addressing ruminating thoughts. Clinician presented initial daily activities such as 'disobey on purpose' to practice mastery over the fact that automatic negative thoughts do not have to control our behaviors. Clinician provided the Distress Tolerance Skill: IMPROVE the Moment. Clinician and group members reviewed examples for each section of the intervention. Client participated in discussions. Client verbalized concerns related to upcoming health procedures and how she is currently coping.  Olegario Messier, LCSW

## 2018-08-21 NOTE — Progress Notes (Signed)
    Daily Group Progress Note  Program: IOP  Group Time: 9am-12pm  Participation Level: Active  Behavioral Response: Appropriate  Type of Therapy:  Group Therapy; psychoeducational group, process group  Summary of Progress:  Clinician provided psycho-educational material on ETF (Emotional Freedom Techniques) Tapping. Clinician provided exercise for group to practice in session. Clinician facilitated discussion following activity to process any follow up thoughts or feelings.  Clinician presented the topic of boundaries. Clinician provided educational material on definintion of bounchries, types of personalities in relation to boundaries, examples of how to set boundaries, and common myths related to boundaries. Clinician processed with group members current boundaries set and boundaries group members would like to set. Group processed barriers to setting boundaries with different people and in different situation. Clinician and group members problem solved some option. Clinician praised group members for identifying boundaries currently in place and for sharing how/why they were put in place.  Clinician requested group members share a self care activity planned for the weekend. Client participated in group activities and discussions. Client noted she found it difficult to focus with tapping but did notice the tapping spots were here she felt stress. Client shared some boundaries she put in place with family and verbalized what she plans to communicate to employers when she returns to work.   Megan Messier, LCSW

## 2018-08-22 ENCOUNTER — Ambulatory Visit (HOSPITAL_COMMUNITY): Payer: Self-pay | Admitting: Orthopedic Surgery

## 2018-08-22 ENCOUNTER — Telehealth (HOSPITAL_COMMUNITY): Payer: Self-pay | Admitting: Psychiatry

## 2018-08-22 ENCOUNTER — Other Ambulatory Visit (HOSPITAL_COMMUNITY): Payer: BC Managed Care – PPO | Admitting: Psychiatry

## 2018-08-22 MED ORDER — TRAZODONE HCL 50 MG PO TABS: 100.0000 mg | ORAL_TABLET | Freq: Every day | ORAL | 0 refills | Status: DC

## 2018-08-22 NOTE — Progress Notes (Signed)
    Daily Group Progress Note  Program: IOP  Group Time:   Participation Level: Active  Behavioral Response: Appropriate  Type of Therapy:  Group Therapy; psycho-educational group, process group  Summary of Progress:  The purpose of group is to utilize CBT and DBT skills in a group setting to help clients increase knowledge and use of healthy coping skills and decrease intensity of active mental health symptoms.   9am-10:30am The clinician greeted members and inquired about symptoms and concerns, assessing for SI/HI psychosis and overall level of functioning. The first part of group was led by the pharmacist regarding medications and concerns that group members wanted to address. The pharmacist inquired about clients' concerns regarding their medications and sides effects that they are experiencing. The pharmacist actively listened to each client and responded to their specific concerns. Pharmacist provided psycho-education related to various medications and illicit substances and their impacts on functionality. She also discussed the importance of managing medications and reserving the use of certain medications to improve functionality. Clinician provided additional psycho-educational information related to criteria for diagnostic criteria for substance use disorders.   10:30am-12pm Clinician presented the topic of "authenticity," and engaged clients into discussions about how they present their true self, and beliefs. Clinician prompted clients to explore their struggles in practicing authenticity to find balance. Clinician facilitated discussion with clients on evaluating their core beliefs and the importance of authenticity in their daily life. Clinician and group members processed levels of access given to people in their lives and how what is shown on the outside is still based on decisions from core values even if different people see different things, example a work, family, and public  sense of self. Clinician provided options for clients to maintain awareness of self and the present moment with a breathing/meditation exercise, and gratitude.   Client participated in group discussions and activities. Client shows progress toward goal of managing anxiety as evidence by participation in group without a fidget. Client reports overall managing her anxiety related to upcoming surgery as well as she expects. Client agreed with several group members that she lets different parts of herself show depending on who she is around and how safe she feels sharing.   Megan Messier, LCSW

## 2018-08-22 NOTE — Pre-Procedure Instructions (Signed)
Megan Lang  08/22/2018      [facility] [place], [place] - [address] [place] [address] Phone: [phone] Fax: [phone]    Your procedure is scheduled on Thursday, November 14th .  Report to [facility] Admitting at 7:15 A.M.  Call this number if you have problems the morning of surgery:  [phone]   Remember:  Do not eat or drink after midnight.   Take these medicines the morning of surgery with A SIP OF WATER  acetaminophen (TYLENOL)-as needed albuterol (PROVENTIL HFA;VENTOLIN HFA)-as needed; please bring with you to the hospital. amLODipine (NORVASC) azelastine (ASTELIN)-as needed busPIRone (BUSPAR)  escitalopram (LEXAPRO) fluticasone (FLONASE)-as needed pantoprazole (PROTONIX) Polyethyl Glycol-Propyl Glycol (LUBRICANT EYE DROPS)-as needed ranitidine (ZANTAC) sodium chloride (OCEAN) -as needed  7 days prior to surgery STOP taking any Aspirin(unless otherwise instructed by your surgeon), Aleve, Naproxen, Ibuprofen, Motrin, Advil, Goody's, BC's, all herbal medications, fish oil, and all vitamins. Including your MELOXICAM [facility]).    Do not wear jewelry, make-up or nail polish.  Do not wear lotions, powders, or perfumes, or deodorant.  Do not shave 48 hours prior to surgery.   Do not bring valuables to the hospital.  [facility] is not responsible for any belongings or valuables.  Contacts, dentures or bridgework may not be worn into surgery.  Leave your suitcase in the car.  After surgery it may be brought to your room.  For patients admitted to the hospital, discharge time will be determined by your treatment team.  Patients discharged the day of surgery will not be allowed to drive home.   Special instructions:   [facility]- Preparing For Surgery  Before surgery, you can play an important role. Because skin is not sterile, your skin needs to be as free of germs as possible. You can  reduce the number of germs on your skin by washing with CHG (chlorahexidine gluconate) Soap before surgery.  CHG is an antiseptic cleaner which kills germs and bonds with the skin to continue killing germs even after washing.    Oral Hygiene is also important to reduce your risk of infection.  Remember - BRUSH YOUR TEETH THE MORNING OF SURGERY WITH YOUR REGULAR TOOTHPASTE  Please do not use if you have an allergy to CHG or antibacterial soaps. If your skin becomes reddened/irritated stop using the CHG.  Do not shave (including legs and underarms) for at least 48 hours prior to first CHG shower. It is OK to shave your face.  Please follow these instructions carefully.   1. Shower the NIGHT BEFORE SURGERY and the MORNING OF SURGERY with CHG.   2. If you chose to wash your hair, wash your hair first as usual with your normal shampoo.  3. After you shampoo, rinse your hair and body thoroughly to remove the shampoo.  4. Use CHG as you would any other liquid soap. You can apply CHG directly to the skin and wash gently with a scrungie or a clean washcloth.   5. Apply the CHG Soap to your body ONLY FROM THE NECK DOWN.  Do not use on open wounds or open sores. Avoid contact with your eyes, ears, mouth and genitals (private parts). Wash Face and genitals (private parts)  with your normal soap.  6. Wash thoroughly, paying special attention to the area where your surgery will be performed.  7. Thoroughly rinse your body with warm water from the neck down.  8.  DO NOT shower/wash with your normal soap after using and rinsing off the CHG Soap.  9. Pat yourself dry with a CLEAN TOWEL.  10. Wear CLEAN PAJAMAS to bed the night before surgery, wear comfortable clothes the morning of surgery  11. Place CLEAN SHEETS on your bed the night of your first shower and DO NOT SLEEP WITH PETS.    Day of Surgery:  Do not apply any deodorants/lotions.  Please wear clean clothes to the hospital/surgery center.    Remember to brush your teeth WITH YOUR REGULAR TOOTHPASTE.   Please read over the following fact sheets that you were given.

## 2018-08-22 NOTE — Telephone Encounter (Signed)
D:  Pt had stated yesterday that she wouldn't be attending MH-IOP today nor tomorrow due to appointments.  Plans to return on 08-24-18.  A:  Inform treatment team.

## 2018-08-22 NOTE — H&P (Deleted)
  The note originally documented on this encounter has been moved the the encounter in which it belongs.  

## 2018-08-22 NOTE — H&P (Signed)
Megan Lang is a very pleasant 47 year old woman who presented to me with significant neck pain. Over the course of the last several months she has had an epidural injection with no significant improvement in her pain. Her neurological deficits have progressed. As a result of the increased weakness in the ongoing pain and Megan Lang quality-of-life we have elected to move forward with surgery. While the patient's myelopathy seems to be stable her MRI clearly shows multilevel cord compression. As a result we are moving forward with a multilevel anterior cervical discectomy and fusion C3-7.  Medications Reviewed Medications Advil amLODIPine 5 mg tablet bisoprolol 5 mg-hydrochlorothiazide 6.25 mg tablet BuSpar Lexapro naproxen raNITIdine 150 mg capsule traZODone  Review of Systems G-ROS-Short Reported by Patient  Constitutional Constitutional: no fever, no chills, no night sweats, no significant weight loss  Cardiovascular Cardiovascular: no chest pain, no palpitations  Respiratory Respiratory: no cough, no shortness of breath, No COPD  Gastrointestinal Gastrointestinal: no vomiting, no nausea  Musculoskeletal Musculoskeletal: no joint pain, no swelling in Joints  Neurologic Neurologic: no numbness, no tingling, no difficulty with balance  Physical Exam Clinical exam: See is a pleasant individual, who appears younger than their stated age.  She Is alert and orientated 3.  No shortness of breath, chest pain.  Abdomen is soft and non-tender, negative loss of bowel and bladder control, no rebound tenderness.  Negative: skin lesions abrasions contusions  Peripheral pulses: 1+ and symmetrical in the upper and lower extremities. Compartment soft and nontender.  lungs: CTA Hear: RRR. no r/g/m  Gait pattern: Patient is stable with ambulation.  She has some slight difficulty with heel toe ambulation but her balance is still well-maintained  Assistive devices: No assistive devices  Neuro:  Negative Babinski test, negative Hoffman test, negative inverted brachioradialis reflex.  Symmetrical 1+ deep tendon reflexes in the upper and lower extremity throughout.  Positive nerve root tension signs (C7 dermatome).  5 out of 5 motor strength in the upper extremity bilaterally except: tricep: 4/5 bilaterally.   Positive lower extremity weakness in the quad and hip flexors especially first thing in the morning.  This leads to a somewhat unstable ataxic gait pattern.  Musculoskeletal: Significant neck pain and low back pain with palpation and range of motion.  No SI joint pain.  She does have some discomfort in the left shoulder but she still recovering from her rotator cuff repair.  Negative Tinel sign at the elbow and wrist.  MRI of the cervical spine dated 06/12/18 demonstrates multilevel degenerative cervical disease.  She has cord signal changes at C4-5  thru C6 7.  There is severe central and right sided hard disc osteophyte at C4-5 thru C6-7 causing right hemi-cord compression and right lateral recess compression.  Moderate large central and right disc protrusion at C5-6 but no cord signal changes.  There is a left-sided disc protrusion at C3-4.  X-rays of the cervical spine taken today in the office demonstrate loss of normal cervical lordosis.  Advanced degenerative disc disease most noted at C5-6 and C6-7.  No facet degeneration is seen no acute fracture is seen.  Assessment & Plan Diagnosis: Her clinical exam is concerning as there is been progression in her neurological deterioration. She now has weakness in the triceps bilaterally left side a little bit worse than the right, and her gait pattern is becoming a little bit more unstable. At this point time the MRI clearly shows cord signal changes with myelomalacia C4-C7. She also has a left-sided disc protrusion at C3-4.  At this point time we had a long conversation about cervical myelopathy and the natural history of the disease process.  Despite the injection therapy she continues to deteriorate and so I have advised surgical intervention. To address this we will move forward with a 4 level ACDF. This would allow me to address the nerve compression at C3-4, and the spinal cord compression C4-C7.   The goal of surgery is to prevent worsening of the myelopathy. I have told her it is not designed to improve her condition but rather to prevent worsening of her condition. She is expressed an understanding of the risks benefits and goals of surgery.  Risks and benefits of surgery were discussed with the patient. These include: Infection, bleeding, death, stroke, paralysis, ongoing or worse pain, need for additional surgery, nonunion, leak of spinal fluid, adjacent segment degeneration requiring additional fusion surgery. Pseudoarthrosis (nonunion)requiring supplemental posterior fixation. Throat pain, swallowing difficulties, hoarseness or change in voice.   All her and her husbands questions and concerns were addressed. Pre-op clearance has been obtained.

## 2018-08-22 NOTE — Telephone Encounter (Signed)
D:  Pt phoned and stated that her surgeon instructed her not to take the Lexapro and Buspar before her surgery (08-31-18) and she was inquiring about what to do.  A:  Placed call to Ricky Ala, NP and she confirmed that pt should follow the surgeon's instructions.  Informed pt.  R:  Pt receptive.

## 2018-08-23 ENCOUNTER — Other Ambulatory Visit: Payer: Self-pay

## 2018-08-23 ENCOUNTER — Ambulatory Visit: Payer: BC Managed Care – PPO | Admitting: Physician Assistant

## 2018-08-23 ENCOUNTER — Encounter (HOSPITAL_COMMUNITY): Payer: Self-pay

## 2018-08-23 ENCOUNTER — Encounter (HOSPITAL_COMMUNITY)
Admission: RE | Admit: 2018-08-23 | Discharge: 2018-08-23 | Disposition: A | Discharge status: Home / Self Care | Payer: BC Managed Care – PPO | Source: Ambulatory Visit | Attending: Orthopedic Surgery | Admitting: Orthopedic Surgery

## 2018-08-23 DIAGNOSIS — Z01812 Encounter for preprocedural laboratory examination: Secondary | ICD-10-CM | POA: Diagnosis present

## 2018-08-23 HISTORY — DX: Unspecified osteoarthritis, unspecified site: M19.90

## 2018-08-23 HISTORY — DX: Major depressive disorder, single episode, unspecified: F32.9

## 2018-08-23 HISTORY — DX: Depression, unspecified: F32.A

## 2018-08-23 LAB — BASIC METABOLIC PANEL
ANION GAP: 9 (ref 5–15)
BUN: 10 mg/dL (ref 6–20)
CALCIUM: 9.4 mg/dL (ref 8.9–10.3)
CO2: 24 mmol/L (ref 22–32)
CREATININE: 0.87 mg/dL (ref 0.44–1.00)
Chloride: 106 mmol/L (ref 98–111)
Glucose, Bld: 81 mg/dL (ref 70–99)
Potassium: 3.2 mmol/L — ABNORMAL LOW (ref 3.5–5.1)
Sodium: 139 mmol/L (ref 135–145)

## 2018-08-23 LAB — CBC
HCT: 41.9 % (ref 36.0–46.0)
Hemoglobin: 13.5 g/dL (ref 12.0–15.0)
MCH: 29.6 pg (ref 26.0–34.0)
MCHC: 32.2 g/dL (ref 30.0–36.0)
MCV: 91.9 fL (ref 80.0–100.0)
NRBC: 0 % (ref 0.0–0.2)
PLATELETS: 293 10*3/uL (ref 150–400)
RBC: 4.56 MIL/uL (ref 3.87–5.11)
RDW: 13.1 % (ref 11.5–15.5)
WBC: 5.2 10*3/uL (ref 4.0–10.5)

## 2018-08-23 LAB — SURGICAL PCR SCREEN
MRSA, PCR: NEGATIVE
Staphylococcus aureus: NEGATIVE

## 2018-08-23 NOTE — Progress Notes (Signed)
PCP - Delman Cheadle (on leave); Juanda Crumble at [place].  Cardiologist - Last visit with Skeet Latch in 2017  Chest x-ray -07/23/18  EKG - 07/23/18 Stress Test - 11/04/15 ECHO - 10/30/15 Cardiac Cath - denies  Sleep Study - denies  Aspirin Instructions: N/A  Anesthesia review: Yes, per order.   Patient denies shortness of breath, fever, cough and chest pain at PAT appointment   Patient verbalized understanding of instructions that were given to them at the PAT appointment. Patient was also instructed that they will need to review over the PAT instructions again at home before surgery.

## 2018-08-24 ENCOUNTER — Other Ambulatory Visit (HOSPITAL_COMMUNITY): Payer: BC Managed Care – PPO | Admitting: Licensed Clinical Social Worker

## 2018-08-24 DIAGNOSIS — F329 Major depressive disorder, single episode, unspecified: Secondary | ICD-10-CM | POA: Diagnosis not present

## 2018-08-24 DIAGNOSIS — F32A Depression, unspecified: Secondary | ICD-10-CM

## 2018-08-24 NOTE — Progress Notes (Signed)
    Daily Group Progress Note  Program: IOP  Group Time: 9am-12pm  Participation Level: Active  Behavioral Response: Appropriate, Sharing and Motivated  Type of Therapy:  Group Therapy; process group, pscyho-educational group  Summary of Progress:  The purpose of this session is to utilize CBT and DBT skills in a group setting to increase utilization of healthy coping skills and decrease intensity of active mental health symptoms.  9am-11a: Clinician presented the topic of Personal boundaries. Clinician provided psycho-educational material related to types of boundaries and common traits of types of boundaries (rigid, porous, and healthy.) Clinician and group members processed limits currently set in daily life. Clinician and group members discussed having different boundaries with different people and processed why this can be important. Clinician and group members role played plans for setting boundaries with family members and employers. Clinician praised group members verbalizing boundaries. Clinician and group members reviewed types of boundaries and examples including physical, emotional, sexual, material, and time boundaries. Clinician and group members processed how boundaries currently exist in their lives, how they want them to look, and brainstormed ways to get there.  11am-12pm: Co-facilitator provided yoga/meditation activity focused on utilizing mindfulness skills to manage emotional distress. Client reports using tapping and breathing skills outside of group with some success, most recently at her doctor appointments. Client participated in all group discussions and activities and was able to demonstrate assertive communication when setting boundaries in role plays with clinician.  Olegario Messier, LCSW

## 2018-08-24 NOTE — Anesthesia Preprocedure Evaluation (Addendum)
Anesthesia Evaluation  Patient identified by MRN, date of birth, ID band Patient awake    Reviewed: Allergy & Precautions, NPO status , Patient's Chart, lab work & pertinent test results  History of Anesthesia Complications Negative for: history of anesthetic complications  Airway Mallampati: II  TM Distance: >3 FB Neck ROM: Full    Dental no notable dental hx. (+) Teeth Intact, Dental Advisory Given   Pulmonary asthma ,    Pulmonary exam normal breath sounds clear to auscultation       Cardiovascular hypertension, Pt. on medications Normal cardiovascular exam Rhythm:Regular Rate:Normal  Normal TTE 1/17   Neuro/Psych Anxiety Depression Cervical myelopathy    GI/Hepatic Neg liver ROS, hiatal hernia, GERD  Medicated,  Endo/Other  negative endocrine ROSObese, BMI 33.7  Renal/GU negative Renal ROS     Musculoskeletal  (+) Arthritis , Osteoarthritis,    Abdominal   Peds  Hematology negative hematology ROS (+)   Anesthesia Other Findings Day of surgery medications reviewed with the patient.  Reproductive/Obstetrics                           Anesthesia Physical Anesthesia Plan  ASA: II  Anesthesia Plan: General   Post-op Pain Management:    Induction: Intravenous  PONV Risk Score and Plan: 3 and Treatment may vary due to age or medical condition, Ondansetron, Dexamethasone, Midazolam and Scopolamine patch - Pre-op  Airway Management Planned: Video Laryngoscope Planned and Oral ETT  Additional Equipment:   Intra-op Plan:   Post-operative Plan: Extubation in OR  Informed Consent: I have reviewed the patients History and Physical, chart, labs and discussed the procedure including the risks, benefits and alternatives for the proposed anesthesia with the patient or authorized representative who has indicated his/her understanding and acceptance.   Dental advisory given  Plan Discussed  with: CRNA  Anesthesia Plan Comments: (See PAT note 08/23/2018 by Karoline Caldwell, PA-C )      Anesthesia Quick Evaluation

## 2018-08-24 NOTE — Progress Notes (Signed)
Anesthesia Chart Review:  Case:  [id number] Date/Time:  08/31/18 0900   Procedure:  ANTERIOR CERVICAL DECOMPRESSION/DISCECTOMY FUSION C3-7 (N/A ) - 6 hrs   Anesthesia type:  General   Pre-op diagnosis:  Cervical myelopathy   Location:  [facility] OR ROOM 04 / [facility] OR   Surgeon:  Melina Schools, MD      DISCUSSION:  47 yo female never smoker. Pertinent hx includes HTN, Anxiety, Depression, GERD, Asthma, Thoracic aortic aneurysm.  Pt with history of anxiety and multiple ED visits for chest pain, abd pain, back pain. Most recently seen in ED for CP 07/23/18. Per note "Symptoms consistent with stress reaction/ anxiety.  Ruled out for MI and PE in the ED.  Follow up you PMD to discuss ongoing treatment of your symptoms."  She had cardiac eval in 2017 by Dr. Oval Linsey with exercise Cardiolite negative for ischemia and echo with nl systolic and diastolic function.  She follows with Dr. Prescott Gum for surveillance of thoracic aortic aneurysm. Last seen 12/28/2017, stable at that time, recommended f/u in 85yr.  Anticipate she can proceed as planned barring acute status change.  VS: BP (!) 148/93 Comment: pt states that she is a bit anxious  Pulse 73   Temp 36.9 C   Resp 18   Ht 5\' 2"  (1.575 m)   Wt 83.5 kg   LMP 08/14/2018   SpO2 100%   BMI 33.65 kg/m   PROVIDERS: Shawnee Knapp, MD is PCP   LABS: Labs reviewed: Acceptable for surgery. (all labs ordered are listed, but only abnormal results are displayed)  Labs Reviewed  BASIC METABOLIC PANEL - Abnormal; Notable for the following components:      Result Value   Potassium 3.2 (*)    All other components within normal limits  SURGICAL PCR SCREEN  CBC     IMAGES: CHEST - 2 VIEW 07/23/2018  COMPARISON:  Chest CT 12/07/2017  FINDINGS: The cardiomediastinal contours are normal. The lungs are clear. Pulmonary vasculature is normal. No consolidation, pleural effusion, or pneumothorax. No acute osseous abnormalities are seen.  IMPRESSION: No  acute findings.   EKG: 07/23/2018: Sinus rhythm. Rate 97.  CV:  Stress Cardiolite 11/04/2015:  The left ventricular ejection fraction is normal (55-65%).  Nuclear stress EF: 61%.  Blood pressure demonstrated a blunted response to exercise.  There was no ST segment deviation noted during stress.  The study is normal.   Normal stress nuclear study with no ischemia or infarction; EF 61 with normal wall motion.  TTE 10/30/2015: Study Conclusions  - Left ventricle: The cavity size was normal. There was mild focal   basal hypertrophy of the septum. Systolic function was normal.   The estimated ejection fraction was in the range of 55% to 60%.   Wall motion was normal; there were no regional wall motion   abnormalities. Left ventricular diastolic function parameters   were normal.  Impressions:  - Normal study.   Past Medical History:  Diagnosis Date  . Acid reflux   . Allergy   . Anxiety   . Aortic aneurysm ([place])   . Arthritis   . Asthma   . Cardiomegaly   . Carpal tunnel syndrome   . Depression   . Gastric polyp   . GERD (gastroesophageal reflux disease) 01/23/2016  . Hiatal hernia   . Hypertension   . Normal spontaneous vaginal delivery    3    Past Surgical History:  Procedure Laterality Date  . CARPAL TUNNEL RELEASE  2009  . CARPAL TUNNEL RELEASE Left   . COLONOSCOPY    . NASAL SINUS SURGERY     DEC 15,2016  . ROTATOR CUFF REPAIR Left 04/14/2018    MEDICATIONS: . acetaminophen (TYLENOL) 500 MG tablet  . albuterol (PROVENTIL HFA;VENTOLIN HFA) 108 (90 Base) MCG/ACT inhaler  . ALPRAZolam (XANAX) 0.25 MG tablet  . amLODipine (NORVASC) 5 MG tablet  . azelastine (ASTELIN) 0.1 % nasal spray  . bisoprolol-hydrochlorothiazide (ZIAC) 5-6.25 MG tablet  . busPIRone (BUSPAR) 15 MG tablet  . cyclobenzaprine (FLEXERIL) 10 MG tablet  . docusate sodium (COLACE) 100 MG capsule  . escitalopram (LEXAPRO) 10 MG tablet  . etonogestrel (NEXPLANON) 68 MG IMPL implant   . fluticasone (FLONASE) 50 MCG/ACT nasal spray  . ibuprofen (ADVIL,MOTRIN) 600 MG tablet  . meloxicam (MOBIC) 15 MG tablet  . Multiple Vitamins-Minerals (MULTIVITAMIN GUMMIES ADULT PO)  . naproxen (NAPROSYN) 500 MG tablet  . pantoprazole (PROTONIX) 40 MG tablet  . Polyethyl Glycol-Propyl Glycol (LUBRICANT EYE DROPS) 0.4-0.3 % SOLN  . potassium chloride SA (K-DUR,KLOR-CON) 20 MEQ tablet  . ranitidine (ZANTAC) 150 MG capsule  . sodium chloride (OCEAN) 0.65 % SOLN nasal spray  . traZODone (DESYREL) 50 MG tablet   No current facility-administered medications for this encounter.       Wynonia Musty [facility] Short Stay Center/Anesthesiology Phone [phone] 08/24/2018 1:09 PM

## 2018-08-25 ENCOUNTER — Other Ambulatory Visit (HOSPITAL_COMMUNITY): Payer: BC Managed Care – PPO | Admitting: Licensed Clinical Social Worker

## 2018-08-25 DIAGNOSIS — F329 Major depressive disorder, single episode, unspecified: Secondary | ICD-10-CM | POA: Diagnosis not present

## 2018-08-25 DIAGNOSIS — F32A Depression, unspecified: Secondary | ICD-10-CM

## 2018-08-28 ENCOUNTER — Other Ambulatory Visit (HOSPITAL_COMMUNITY): Payer: BC Managed Care – PPO | Admitting: Licensed Clinical Social Worker

## 2018-08-28 ENCOUNTER — Ambulatory Visit: Payer: BC Managed Care – PPO | Admitting: Psychology

## 2018-08-28 DIAGNOSIS — F329 Major depressive disorder, single episode, unspecified: Secondary | ICD-10-CM

## 2018-08-28 DIAGNOSIS — F32A Depression, unspecified: Secondary | ICD-10-CM

## 2018-08-28 NOTE — Progress Notes (Signed)
    Daily Group Progress Note  Program: IOP  Group Time: 9am-12pm  Participation Level: Active  Behavioral Response: Appropriate, Sharing and Motivated  Type of Therapy:  Group Therapy; process group, psycho-educational group  Summary of Progress:  The purpose of this group is to utilize CBT and DBT skills in a group setting to increase use of healthy coping skills and decrease intensity of active mental health symptoms.  9am-10:30am Clinician checked in with group members, inquiring about recently used skills. Clinician presented the topic of slow, steady progress leading to positive results. Clinician processed with clients previous times of feeling uncomfortable feelings and how they have survived. Clinician focused on recent events which the clients identified as problematic and how they 'survived.' Clinician and group members processed how to find meaning in the struggle and see change as a growth process. Clinician provided clients with the 5-Senses activity, focused on grounding clients in the moment when feeling overwhelmed.  10:30am-12pm Clinician presented psycho-educational material on addressing ruminating thoughts using 'Catch It, Check It, Change It." Clinician reviewed the cognitive triangle and the connection between thoughts, emotions, and behaviors. Clinician praised clients for identifying several distorted thinking moments from the past several weeks and being able to identify alternative thoughts. Clinician challenged clients to focus on identifying if an unhelpful thought was a fact or an opinion, in order to help challenge and change a thought. Clinician and group members practiced identifying an event, emotion, automatic thought, and creating a rational response. Clinician and group members reviewed '7 Strategies how to get rid of your negative thoughts.' Clinician inquired about one thing clients are grateful for, and one activity of self care planned for the rest of the  day. Client participated in all group discussions. Client reports spending time in public over the weekend while managing her anxiety. Client reports using 'Retraining Your Breathing' activity and practicing daily. Client states this skill is helpful and she plans to use it before and after her surgery. Client was open to challenges this day from clinician and group members. Client was able to identify some ways she currently works on challenging negative thoughts such as visualization, or getting up to do an activity.  Olegario Messier, LCSW

## 2018-08-28 NOTE — Progress Notes (Signed)
    Daily Group Progress Note  Program: IOP  Group Time: 9am-12pm  Participation Level: Active  Behavioral Response: Appropriate  Type of Therapy:  Group Therapy; process group, psycho-educational group  Summary of Progress:  The purpose of group is to utilize CBT and DBT skills in a group setting to help clients increase healthy coping skills and decrease intensity of active mental health symptoms.  9:00 to 11:00 The clinician greeted members and inquired about symptoms and concerns, assessing for SI/HI/psychosis and overall level of functioning. The first part of group focused on, "Communication skills to get our needs met." Clinician facilitated the session by providing psycho-educational handout to clients with DBT skills including DEARMAN, GIVE, and FAST. Clinician and clients discussed when each skill could be used based on desired goal of the conversation. Clinician and clients explored feelings related to effective communications and interpersonal skills in different settings. Clinician validated clients thoughts and feelings that effective communication is difficult in a non-supportive environment. Clinicians coached clients to practiced articulating what they need effectively using listed skills. Clinician helped clients acknowledge their progress in being able to verbalize their needs effectively and praised clients implementation of previously learned skills. Clinician requested group members complete mindful movement exercise for stretching and focus. 11:00 12pm The second part involved a live video presentation via Zoom by a Medical Doctor on, " Mental Wellness: It's not all in your head." The presentation focused on providing psycho-education on how mental health symptoms can be effected by our diet. The doctor explained how the micro biome, which creates many neurotransmitters, plays an important role in the way we feel mentally and physically, and the importance of preventative  actions to maintain health including balanced diet. The doctor brought awareness of the importance of practicing healthy eating, exercise, relaxation/movement and balanced mindfulness with lifestyle choices to decrease stress and maintain balanced hormones and chemical levels in the body and brain. Client participated in group discussions and activities. Client verbalized when other people do not want ot effectively communicate it makes discussions difficult, however client did role play a conversation she might have at work when she returns.  Olegario Messier, LCSW

## 2018-08-29 ENCOUNTER — Encounter: Payer: Self-pay | Admitting: Physician Assistant

## 2018-08-29 ENCOUNTER — Other Ambulatory Visit (HOSPITAL_COMMUNITY): Payer: BC Managed Care – PPO | Admitting: Licensed Clinical Social Worker

## 2018-08-29 DIAGNOSIS — F329 Major depressive disorder, single episode, unspecified: Secondary | ICD-10-CM | POA: Diagnosis not present

## 2018-08-29 DIAGNOSIS — M4802 Spinal stenosis, cervical region: Secondary | ICD-10-CM | POA: Insufficient documentation

## 2018-08-29 DIAGNOSIS — F32A Depression, unspecified: Secondary | ICD-10-CM

## 2018-08-29 DIAGNOSIS — M542 Cervicalgia: Secondary | ICD-10-CM | POA: Insufficient documentation

## 2018-08-29 NOTE — Patient Instructions (Signed)
D:  Patient completed MH-IOP today.  A:  Discharge today.  Follow up with Dr. Sharon Seller on 08-30-18 @ 9:30 a.m and patient will schedule appointment with Dennison Bulla, LPC.  Encouraged support groups.  R:  Patient receptive.

## 2018-08-29 NOTE — Progress Notes (Signed)
Megan Lang is a 47 y.o., married, employed, [place] American female who was referred per TTS as a walk-in; treatment for worsening anxiety with passive SI.  Pt has been c/o of panic attacks.  States she's been struggling with sx's since finding out she would need neck/spine surgery.  Pt had shoulder surgery on April 14, 2018 and after having a MRI was told she had stenosis.  Upcoming neck surgery is August 31, 2018.  Pt has been out of work on leave since the surgery.  Pt states since the surgery date was scheduled she has been increasingly anxious.  Denies a plan or intent.  Discussed safety options at length; pt able to contract for safety.  Other stessors include:  Unresolved grief/loss issues:  2014-01-14 sister died, 01-15-2015 mother died, daughter recently had miscarriage and another one of her kids was dx'd with Lupus.  Pt has been seeing Dennison Bulla, [facility] for counseling; upcoming new pt appt with Sharon Seller, NP on 08-30-18 @ 9:30 a.m.  Family Hx:  Two brothers (drugs). Pt completed MH-IOP today.  Denies SI/HI or A/V hallucinations.  Reports although overall mood is improving; she is feeling very anxious d/t upcoming neck surgery on 08-31-18.  Pt admitted if she didn't have the surgery scheduled; she wouldn't be feeling as anxious.  Incoming depression scale score was 27; discharge is 16.  A:  D/C today.  F/U with Dr. Sharon Seller on 08-30-18 @ 9:30 a.m.  Pt will schedule an appt with Dennison Bulla, LPC.  Encouraged support groups.  Strongly recommended [facility] with Binnie Rail, LCAS; introduced pt to [facility].  Provided pt with Beth's contact information.  R:  Pt receptive.                                        Carlis Abbott, RITA, M.Ed,CNA

## 2018-08-29 NOTE — Progress Notes (Signed)
  [facility] Discharge Summary  Megan Lang [id number]  Admission date: 08/09/2018 Discharge date: 08/29/2018  Reason for admission:  Worsening Depression  Per assessment note: Megan Lang 47 year old African-American female with worsening depression and anxiety related to chronic medical conditions.  Reports she was recently diagnosed with cervical stenosis and was advised that she needed to have surgery.  Patient reports anxiety and fear related to unknown medical outcome of surgery.  Reports chronic pain.  Patient currently denies that she is followed by a psychiatrist states her primary care provider has been writing her antidepressants for anxiety and depression which she is prescribed BuSpar 15 mg  and Lexapro10 mg.  (unsure if patient's medication compliant).  Patient reports her mother and sister passed a few year ago. Reports the passing of her father when she was 16 years. ( unknown cause).Patient denies previous inpatient admissions.   Chemical Use History: was denied  Family of Origin Issues: Continue to report her husband and children are supportive.   Progress in Program Toward Treatment Goals: Ongoing, Patient attended and participated with daily group session. [name] reports feeling better overall, and has increased anxiety and symptoms of worries related to scheduled cervical surgery. Continue to deny suicidal  or homicidal ideation. Patient reports follow-up appt with Psychiatry on 11/13- patient will f/u for medication refills  Progress (rationale): Patient to keep follow-up with  Dr. Sharon Seller on 08-30-18 @ 9:30 a.m and patient will schedule appointment with Dennison Bulla, [facility]  Take all medications as prescribed. Keep all follow-up appointments as scheduled.  Do not consume alcohol or use illegal drugs while on prescription medications. Report any adverse effects from your medications to your primary care provider  promptly.  In the event of recurrent symptoms or worsening symptoms, call 911, a crisis hotline, or go to the nearest emergency department for evaluation.    Derrill Center, NP 08/29/2018

## 2018-08-30 ENCOUNTER — Other Ambulatory Visit (HOSPITAL_COMMUNITY): Payer: BC Managed Care – PPO

## 2018-08-30 MED ORDER — VANCOMYCIN HCL 10 G IV SOLR
1500.0000 mg | INTRAVENOUS | Status: AC
  Administered 2018-08-31 (×2): 1500 mg via INTRAVENOUS
  Filled 2018-08-30: qty 1500

## 2018-08-30 NOTE — Progress Notes (Signed)
    Daily Group Progress Note  Program: IOP  Group Time: 9am-12pm  Participation Level: Active  Behavioral Response: Appropriate, Sharing and Motivated  Type of Therapy:  Group Therapy; psycho-educational group, process group  Summary of Progress:  The purpose of this contact is to utilize CBT and DBT skills in a group setting to increase knowledge of and utilization of healthy coping skills and decrease active mental health symptoms.  9am-11am Clinician presented the topic of Letting Go of Emotional Suffering: Mindfulness of Your Current Emotion. Clinician and group members reviewed skill of identifying and observing emotions, experiencing emotions, and not acting on or judgment of emotion and related thoughts. Clinician and group members reviewed CBT triangle and connection of thoughts, feelings, and behaviors. Clinician prompted clients to identify thoughts and feelings related to conversations in group. Clinician and group members reviewed Emotional Regulation Skills: Opposite Action, Check the Facts, P.L.E.A.S.E., and Paying Attention to Positive Events. Clinician and group members discussed WAIT skill for coping with emotions coming as waves.  11am-12pm Chaplin facilitated group discussion processing grief and losses in life, and where clients learned to coping with loss. Client completes IOP on this day. Client has show progress as evidence by increased participation in discussions, demonstration of assertive communication and boundary setting skills, and use of relaxation skills. Client reports being nervous about upcoming surgery but believes she can manage her anxiety.  Olegario Messier, LCSW

## 2018-08-31 ENCOUNTER — Inpatient Hospital Stay (HOSPITAL_COMMUNITY): Payer: BC Managed Care – PPO | Admitting: Registered Nurse

## 2018-08-31 ENCOUNTER — Inpatient Hospital Stay (HOSPITAL_COMMUNITY)
Admission: RE | Admit: 2018-08-31 | Discharge: 2018-09-02 | DRG: 472 | Disposition: A | Discharge status: Home / Self Care | Payer: BC Managed Care – PPO | Attending: Orthopedic Surgery | Admitting: Orthopedic Surgery

## 2018-08-31 ENCOUNTER — Inpatient Hospital Stay (HOSPITAL_COMMUNITY): Payer: BC Managed Care – PPO

## 2018-08-31 ENCOUNTER — Inpatient Hospital Stay (HOSPITAL_COMMUNITY)
Admission: RE | Disposition: A | Discharge status: Home / Self Care | Payer: Self-pay | Source: Home / Self Care | Attending: Orthopedic Surgery

## 2018-08-31 ENCOUNTER — Inpatient Hospital Stay (HOSPITAL_COMMUNITY): Payer: BC Managed Care – PPO | Admitting: Vascular Surgery

## 2018-08-31 ENCOUNTER — Encounter (HOSPITAL_COMMUNITY): Payer: Self-pay | Admitting: Urology

## 2018-08-31 DIAGNOSIS — K219 Gastro-esophageal reflux disease without esophagitis: Secondary | ICD-10-CM | POA: Diagnosis present

## 2018-08-31 DIAGNOSIS — M1990 Unspecified osteoarthritis, unspecified site: Secondary | ICD-10-CM | POA: Diagnosis present

## 2018-08-31 DIAGNOSIS — M4802 Spinal stenosis, cervical region: Secondary | ICD-10-CM | POA: Diagnosis present

## 2018-08-31 DIAGNOSIS — Z419 Encounter for procedure for purposes other than remedying health state, unspecified: Secondary | ICD-10-CM

## 2018-08-31 DIAGNOSIS — M4602 Spinal enthesopathy, cervical region: Secondary | ICD-10-CM | POA: Diagnosis present

## 2018-08-31 DIAGNOSIS — F419 Anxiety disorder, unspecified: Secondary | ICD-10-CM | POA: Diagnosis present

## 2018-08-31 DIAGNOSIS — J45909 Unspecified asthma, uncomplicated: Secondary | ICD-10-CM | POA: Diagnosis present

## 2018-08-31 DIAGNOSIS — F329 Major depressive disorder, single episode, unspecified: Secondary | ICD-10-CM | POA: Diagnosis present

## 2018-08-31 DIAGNOSIS — I10 Essential (primary) hypertension: Secondary | ICD-10-CM | POA: Diagnosis present

## 2018-08-31 DIAGNOSIS — M2578 Osteophyte, vertebrae: Secondary | ICD-10-CM | POA: Diagnosis present

## 2018-08-31 DIAGNOSIS — Z6833 Body mass index (BMI) 33.0-33.9, adult: Secondary | ICD-10-CM | POA: Diagnosis not present

## 2018-08-31 DIAGNOSIS — J029 Acute pharyngitis, unspecified: Secondary | ICD-10-CM | POA: Diagnosis not present

## 2018-08-31 DIAGNOSIS — M4712 Other spondylosis with myelopathy, cervical region: Principal | ICD-10-CM | POA: Diagnosis present

## 2018-08-31 DIAGNOSIS — M50023 Cervical disc disorder at C6-C7 level with myelopathy: Secondary | ICD-10-CM | POA: Diagnosis present

## 2018-08-31 DIAGNOSIS — G959 Disease of spinal cord, unspecified: Secondary | ICD-10-CM | POA: Diagnosis present

## 2018-08-31 DIAGNOSIS — E669 Obesity, unspecified: Secondary | ICD-10-CM | POA: Diagnosis present

## 2018-08-31 HISTORY — PX: ANTERIOR CERVICAL DECOMPRESSION/DISCECTOMY FUSION 4 LEVELS: SHX5556

## 2018-08-31 LAB — POCT PREGNANCY, URINE: Preg Test, Ur: NEGATIVE

## 2018-08-31 SURGERY — ANTERIOR CERVICAL DECOMPRESSION/DISCECTOMY FUSION 4 LEVELS
Anesthesia: General | Site: Neck

## 2018-08-31 MED ORDER — ACETAMINOPHEN 10 MG/ML IV SOLN
INTRAVENOUS | Status: DC | PRN
  Administered 2018-08-31: 1000 mg via INTRAVENOUS

## 2018-08-31 MED ORDER — GLYCOPYRROLATE PF 0.2 MG/ML IJ SOSY
PREFILLED_SYRINGE | INTRAMUSCULAR | Status: DC | PRN
  Administered 2018-08-31: .1 mg via INTRAVENOUS

## 2018-08-31 MED ORDER — BUSPIRONE HCL 5 MG PO TABS
30.0000 mg | ORAL_TABLET | Freq: Every day | ORAL | Status: DC
  Administered 2018-09-01 – 2018-09-02 (×2): 30 mg via ORAL
  Filled 2018-08-31 (×2): qty 6

## 2018-08-31 MED ORDER — ONDANSETRON HCL 4 MG/2ML IJ SOLN
INTRAMUSCULAR | Status: DC | PRN
  Administered 2018-08-31: 4 mg via INTRAVENOUS

## 2018-08-31 MED ORDER — ACETAMINOPHEN 10 MG/ML IV SOLN
INTRAVENOUS | Status: AC
  Filled 2018-08-31: qty 100

## 2018-08-31 MED ORDER — TRANEXAMIC ACID-NACL 1000-0.7 MG/100ML-% IV SOLN
1000.0000 mg | INTRAVENOUS | Status: AC
  Administered 2018-08-31: 1000 mg via INTRAVENOUS
  Filled 2018-08-31: qty 100

## 2018-08-31 MED ORDER — LIDOCAINE 2% (20 MG/ML) 5 ML SYRINGE
INTRAMUSCULAR | Status: DC | PRN
  Administered 2018-08-31: 40 mg via INTRAVENOUS
  Administered 2018-08-31: 60 mg via INTRAVENOUS

## 2018-08-31 MED ORDER — HYDROMORPHONE HCL 1 MG/ML IJ SOLN
0.2500 mg | INTRAMUSCULAR | Status: DC | PRN
  Administered 2018-08-31 (×2): 0.5 mg via INTRAVENOUS

## 2018-08-31 MED ORDER — LACTATED RINGERS IV SOLN: INTRAVENOUS | Status: DC

## 2018-08-31 MED ORDER — PHENOL 1.4 % MT LIQD: 1.0000 | OROMUCOSAL | Status: DC | PRN

## 2018-08-31 MED ORDER — ONDANSETRON HCL 4 MG/2ML IJ SOLN: 4.0000 mg | Freq: Four times a day (QID) | INTRAMUSCULAR | Status: DC | PRN

## 2018-08-31 MED ORDER — DEXAMETHASONE SODIUM PHOSPHATE 4 MG/ML IJ SOLN: 4.0000 mg | Freq: Four times a day (QID) | INTRAMUSCULAR | Status: AC

## 2018-08-31 MED ORDER — BUPIVACAINE-EPINEPHRINE 0.25% -1:200000 IJ SOLN
INTRAMUSCULAR | Status: DC | PRN
  Administered 2018-08-31: 20 mL

## 2018-08-31 MED ORDER — SUCCINYLCHOLINE CHLORIDE 20 MG/ML IJ SOLN
INTRAMUSCULAR | Status: DC | PRN
  Administered 2018-08-31: 120 mg via INTRAVENOUS

## 2018-08-31 MED ORDER — VANCOMYCIN HCL IN DEXTROSE 1-5 GM/200ML-% IV SOLN
1000.0000 mg | Freq: Two times a day (BID) | INTRAVENOUS | Status: DC
  Administered 2018-08-31 – 2018-09-01 (×2): 1000 mg via INTRAVENOUS
  Filled 2018-08-31 (×4): qty 200

## 2018-08-31 MED ORDER — FENTANYL CITRATE (PF) 100 MCG/2ML IJ SOLN
INTRAMUSCULAR | Status: DC | PRN
  Administered 2018-08-31: 50 ug via INTRAVENOUS
  Administered 2018-08-31 (×3): 100 ug via INTRAVENOUS

## 2018-08-31 MED ORDER — LACTATED RINGERS IV SOLN
INTRAVENOUS | Status: DC
  Administered 2018-08-31 (×2): via INTRAVENOUS

## 2018-08-31 MED ORDER — DEXAMETHASONE SODIUM PHOSPHATE 10 MG/ML IJ SOLN
INTRAMUSCULAR | Status: AC
  Filled 2018-08-31: qty 1

## 2018-08-31 MED ORDER — SODIUM CHLORIDE 0.9% FLUSH: 3.0000 mL | INTRAVENOUS | Status: DC | PRN

## 2018-08-31 MED ORDER — BUPIVACAINE-EPINEPHRINE (PF) 0.25% -1:200000 IJ SOLN
INTRAMUSCULAR | Status: AC
  Filled 2018-08-31: qty 30

## 2018-08-31 MED ORDER — 0.9 % SODIUM CHLORIDE (POUR BTL) OPTIME
TOPICAL | Status: DC | PRN
  Administered 2018-08-31 (×3): 1000 mL

## 2018-08-31 MED ORDER — MENTHOL 3 MG MT LOZG
1.0000 | LOZENGE | OROMUCOSAL | Status: DC | PRN
  Filled 2018-08-31 (×2): qty 9

## 2018-08-31 MED ORDER — THROMBIN (RECOMBINANT) 20000 UNITS EX SOLR
CUTANEOUS | Status: AC
  Filled 2018-08-31: qty 20000

## 2018-08-31 MED ORDER — PROPOFOL 500 MG/50ML IV EMUL
INTRAVENOUS | Status: DC | PRN
  Administered 2018-08-31: 11:00:00 via INTRAVENOUS
  Administered 2018-08-31: 50 ug/kg/min via INTRAVENOUS
  Administered 2018-08-31: 13:00:00 via INTRAVENOUS

## 2018-08-31 MED ORDER — FENTANYL CITRATE (PF) 250 MCG/5ML IJ SOLN
INTRAMUSCULAR | Status: AC
  Filled 2018-08-31: qty 5

## 2018-08-31 MED ORDER — DEXAMETHASONE SODIUM PHOSPHATE 10 MG/ML IJ SOLN
INTRAMUSCULAR | Status: DC | PRN
  Administered 2018-08-31: 10 mg via INTRAVENOUS

## 2018-08-31 MED ORDER — GLYCOPYRROLATE PF 0.2 MG/ML IJ SOSY
PREFILLED_SYRINGE | INTRAMUSCULAR | Status: AC
  Filled 2018-08-31: qty 1

## 2018-08-31 MED ORDER — SODIUM CHLORIDE 0.9 % IV SOLN
INTRAVENOUS | Status: DC | PRN
  Administered 2018-08-31: 10 ug/min via INTRAVENOUS
  Administered 2018-08-31: 13:00:00 via INTRAVENOUS
  Administered 2018-08-31: 50 ug/min via INTRAVENOUS
  Administered 2018-08-31: 35 ug/min via INTRAVENOUS

## 2018-08-31 MED ORDER — PHENYLEPHRINE 40 MCG/ML (10ML) SYRINGE FOR IV PUSH (FOR BLOOD PRESSURE SUPPORT)
PREFILLED_SYRINGE | INTRAVENOUS | Status: AC
  Filled 2018-08-31: qty 20

## 2018-08-31 MED ORDER — MIDAZOLAM HCL 2 MG/2ML IJ SOLN
INTRAMUSCULAR | Status: AC
  Filled 2018-08-31: qty 2

## 2018-08-31 MED ORDER — FENTANYL CITRATE (PF) 100 MCG/2ML IJ SOLN
INTRAMUSCULAR | Status: AC
  Filled 2018-08-31: qty 2

## 2018-08-31 MED ORDER — LIDOCAINE 2% (20 MG/ML) 5 ML SYRINGE
INTRAMUSCULAR | Status: AC
  Filled 2018-08-31: qty 5

## 2018-08-31 MED ORDER — METHOCARBAMOL 1000 MG/10ML IJ SOLN
500.0000 mg | Freq: Four times a day (QID) | INTRAVENOUS | Status: DC | PRN
  Filled 2018-08-31: qty 5

## 2018-08-31 MED ORDER — PHENYLEPHRINE 40 MCG/ML (10ML) SYRINGE FOR IV PUSH (FOR BLOOD PRESSURE SUPPORT)
PREFILLED_SYRINGE | INTRAVENOUS | Status: DC | PRN
  Administered 2018-08-31: 120 ug via INTRAVENOUS
  Administered 2018-08-31 (×2): 80 ug via INTRAVENOUS
  Administered 2018-08-31: 120 ug via INTRAVENOUS

## 2018-08-31 MED ORDER — SODIUM CHLORIDE 0.9 % IV SOLN: 250.0000 mL | INTRAVENOUS | Status: DC

## 2018-08-31 MED ORDER — SUCCINYLCHOLINE CHLORIDE 200 MG/10ML IV SOSY
PREFILLED_SYRINGE | INTRAVENOUS | Status: AC
  Filled 2018-08-31: qty 10

## 2018-08-31 MED ORDER — METHOCARBAMOL 500 MG PO TABS
500.0000 mg | ORAL_TABLET | Freq: Four times a day (QID) | ORAL | Status: DC | PRN
  Administered 2018-08-31 – 2018-09-02 (×3): 500 mg via ORAL
  Filled 2018-08-31 (×3): qty 1

## 2018-08-31 MED ORDER — HEMOSTATIC AGENTS (NO CHARGE) OPTIME
TOPICAL | Status: DC | PRN
  Administered 2018-08-31 (×2): 1 via TOPICAL

## 2018-08-31 MED ORDER — SODIUM CHLORIDE 0.9 % IV SOLN
0.0500 ug/kg/min | INTRAVENOUS | Status: DC
  Administered 2018-08-31: 0.1 ug/kg/min via INTRAVENOUS
  Filled 2018-08-31 (×2): qty 5000

## 2018-08-31 MED ORDER — ESCITALOPRAM OXALATE 10 MG PO TABS
20.0000 mg | ORAL_TABLET | Freq: Every day | ORAL | Status: DC
  Administered 2018-09-01 – 2018-09-02 (×2): 20 mg via ORAL
  Filled 2018-08-31 (×2): qty 2

## 2018-08-31 MED ORDER — PROPOFOL 10 MG/ML IV BOLUS
INTRAVENOUS | Status: AC
  Filled 2018-08-31: qty 20

## 2018-08-31 MED ORDER — OXYCODONE HCL 5 MG PO TABS: 5.0000 mg | ORAL_TABLET | Freq: Once | ORAL | Status: DC | PRN

## 2018-08-31 MED ORDER — PROMETHAZINE HCL 25 MG/ML IJ SOLN: 6.2500 mg | INTRAMUSCULAR | Status: DC | PRN

## 2018-08-31 MED ORDER — ALBUTEROL SULFATE (2.5 MG/3ML) 0.083% IN NEBU: 3.0000 mL | INHALATION_SOLUTION | Freq: Four times a day (QID) | RESPIRATORY_TRACT | Status: DC | PRN

## 2018-08-31 MED ORDER — ALPRAZOLAM 0.25 MG PO TABS
0.2500 mg | ORAL_TABLET | Freq: Every evening | ORAL | Status: DC | PRN
  Administered 2018-09-01: 0.5 mg via ORAL
  Filled 2018-08-31: qty 2

## 2018-08-31 MED ORDER — ACETAMINOPHEN 10 MG/ML IV SOLN: 1000.0000 mg | Freq: Once | INTRAVENOUS | Status: DC | PRN

## 2018-08-31 MED ORDER — HYDROMORPHONE HCL 1 MG/ML IJ SOLN
INTRAMUSCULAR | Status: AC
  Filled 2018-08-31: qty 1

## 2018-08-31 MED ORDER — OXYCODONE HCL 5 MG PO TABS
10.0000 mg | ORAL_TABLET | ORAL | Status: DC | PRN
  Administered 2018-08-31 – 2018-09-02 (×4): 10 mg via ORAL
  Filled 2018-08-31 (×5): qty 2

## 2018-08-31 MED ORDER — SODIUM CHLORIDE 0.9% FLUSH
3.0000 mL | Freq: Two times a day (BID) | INTRAVENOUS | Status: DC
  Administered 2018-09-01 (×2): 3 mL via INTRAVENOUS

## 2018-08-31 MED ORDER — THROMBIN 20000 UNITS EX SOLR
CUTANEOUS | Status: DC | PRN
  Administered 2018-08-31: 20 mL

## 2018-08-31 MED ORDER — ONDANSETRON HCL 4 MG PO TABS: 4.0000 mg | ORAL_TABLET | Freq: Four times a day (QID) | ORAL | Status: DC | PRN

## 2018-08-31 MED ORDER — AZELASTINE HCL 0.1 % NA SOLN
2.0000 | Freq: Two times a day (BID) | NASAL | Status: DC | PRN
  Filled 2018-08-31: qty 30

## 2018-08-31 MED ORDER — MAGNESIUM HYDROXIDE 400 MG/5ML PO SUSP
30.0000 mL | Freq: Every day | ORAL | Status: DC | PRN
  Administered 2018-09-01: 30 mL via ORAL
  Filled 2018-08-31: qty 30

## 2018-08-31 MED ORDER — GABAPENTIN 300 MG PO CAPS
300.0000 mg | ORAL_CAPSULE | Freq: Three times a day (TID) | ORAL | Status: DC
  Administered 2018-08-31 – 2018-09-02 (×5): 300 mg via ORAL
  Filled 2018-08-31 (×5): qty 1

## 2018-08-31 MED ORDER — BISOPROLOL-HYDROCHLOROTHIAZIDE 5-6.25 MG PO TABS
1.0000 | ORAL_TABLET | Freq: Every day | ORAL | Status: DC
  Administered 2018-09-01 – 2018-09-02 (×2): 1 via ORAL
  Filled 2018-08-31 (×2): qty 1

## 2018-08-31 MED ORDER — ONDANSETRON HCL 4 MG/2ML IJ SOLN
INTRAMUSCULAR | Status: AC
  Filled 2018-08-31: qty 2

## 2018-08-31 MED ORDER — LACTATED RINGERS IV SOLN
INTRAVENOUS | Status: DC | PRN
  Administered 2018-08-31: 10:00:00 via INTRAVENOUS

## 2018-08-31 MED ORDER — AMLODIPINE BESYLATE 5 MG PO TABS
5.0000 mg | ORAL_TABLET | Freq: Every day | ORAL | Status: DC
  Administered 2018-09-01 – 2018-09-02 (×2): 5 mg via ORAL
  Filled 2018-08-31 (×2): qty 1

## 2018-08-31 MED ORDER — DOCUSATE SODIUM 100 MG PO CAPS
100.0000 mg | ORAL_CAPSULE | Freq: Two times a day (BID) | ORAL | Status: DC
  Administered 2018-08-31 – 2018-09-02 (×4): 100 mg via ORAL
  Filled 2018-08-31 (×4): qty 1

## 2018-08-31 MED ORDER — MIDAZOLAM HCL 5 MG/5ML IJ SOLN
INTRAMUSCULAR | Status: DC | PRN
  Administered 2018-08-31: 2 mg via INTRAVENOUS

## 2018-08-31 MED ORDER — OXYCODONE HCL 5 MG/5ML PO SOLN: 5.0000 mg | Freq: Once | ORAL | Status: DC | PRN

## 2018-08-31 MED ORDER — DEXAMETHASONE 4 MG PO TABS
4.0000 mg | ORAL_TABLET | Freq: Four times a day (QID) | ORAL | Status: AC
  Administered 2018-08-31 – 2018-09-01 (×4): 4 mg via ORAL
  Filled 2018-08-31 (×4): qty 1

## 2018-08-31 MED ORDER — POTASSIUM CHLORIDE CRYS ER 20 MEQ PO TBCR
40.0000 meq | EXTENDED_RELEASE_TABLET | Freq: Two times a day (BID) | ORAL | Status: DC
  Administered 2018-08-31 – 2018-09-02 (×4): 40 meq via ORAL
  Filled 2018-08-31 (×4): qty 2

## 2018-08-31 MED ORDER — MORPHINE SULFATE (PF) 2 MG/ML IV SOLN: 2.0000 mg | INTRAVENOUS | Status: DC | PRN

## 2018-08-31 MED ORDER — PROPOFOL 10 MG/ML IV BOLUS
INTRAVENOUS | Status: DC | PRN
  Administered 2018-08-31: 150 mg via INTRAVENOUS
  Administered 2018-08-31: 50 mg via INTRAVENOUS

## 2018-08-31 SURGICAL SUPPLY — 73 items
BONE VIVIGEN FORMABLE 1.3CC (Bone Implant) ×4 IMPLANT
BUR EGG ELITE 4.0 (BURR) IMPLANT
BUR MATCHSTICK NEURO 3.0 LAGG (BURR) IMPLANT
CABLE BIPOLOR RESECTION CORD (MISCELLANEOUS) ×2 IMPLANT
CAGE SPNL 6D 12XSM 14X7X (Cage) ×2 IMPLANT
CAGE SPNL 6D 14XMED 16X7X (Cage) ×1 IMPLANT
CANISTER SUCT 3000ML PPV (MISCELLANEOUS) ×2 IMPLANT
CLSR STERI-STRIP ANTIMIC 1/2X4 (GAUZE/BANDAGES/DRESSINGS) ×2 IMPLANT
COLLAR CERV LO CONTOUR FIRM DE (SOFTGOODS) IMPLANT
COVER MAYO STAND STRL (DRAPES) ×6 IMPLANT
COVER SURGICAL LIGHT HANDLE (MISCELLANEOUS) ×4 IMPLANT
COVER WAND RF STERILE (DRAPES) ×2 IMPLANT
CRADLE DONUT ADULT HEAD (MISCELLANEOUS) ×2 IMPLANT
DERMABOND ADVANCED (GAUZE/BANDAGES/DRESSINGS) ×1
DERMABOND ADVANCED .7 DNX12 (GAUZE/BANDAGES/DRESSINGS) ×1 IMPLANT
DEVICE FUSION NANLCK 6 DEG 6 (Screw) ×1 IMPLANT
DRAIN TLS ROUND 10FR (DRAIN) ×2 IMPLANT
DRAPE C-ARM 42X72 X-RAY (DRAPES) ×2 IMPLANT
DRAPE POUCH INSTRU U-SHP 10X18 (DRAPES) ×2 IMPLANT
DRAPE SURG 17X23 STRL (DRAPES) ×2 IMPLANT
DRAPE U-SHAPE 47X51 STRL (DRAPES) ×2 IMPLANT
DRSG OPSITE POSTOP 4X6 (GAUZE/BANDAGES/DRESSINGS) ×2 IMPLANT
DURAPREP 26ML APPLICATOR (WOUND CARE) ×2 IMPLANT
ELECT COATED BLADE 2.86 ST (ELECTRODE) ×2 IMPLANT
ELECT PENCIL ROCKER SW 15FT (MISCELLANEOUS) ×2 IMPLANT
ELECT REM PT RETURN 9FT ADLT (ELECTROSURGICAL) ×2
ELECTRODE REM PT RTRN 9FT ADLT (ELECTROSURGICAL) ×1 IMPLANT
FEE INTRAOP MONITOR IMPULS NCS (MISCELLANEOUS) ×1 IMPLANT
FUSION TCS NANOLOCK 6 DEG 6 (Screw) ×2 IMPLANT
FUSION TCS NANOLOCK 7MM 6DEG (Cage) ×3 IMPLANT
GAUZE SPONGE 4X4 12PLY STRL (GAUZE/BANDAGES/DRESSINGS) ×2 IMPLANT
GLOVE BIO SURGEON STRL SZ 6.5 (GLOVE) ×2 IMPLANT
GLOVE BIOGEL PI IND STRL 6.5 (GLOVE) ×1 IMPLANT
GLOVE BIOGEL PI IND STRL 8.5 (GLOVE) ×1 IMPLANT
GLOVE BIOGEL PI INDICATOR 6.5 (GLOVE) ×1
GLOVE BIOGEL PI INDICATOR 8.5 (GLOVE) ×1
GLOVE SS BIOGEL STRL SZ 8.5 (GLOVE) ×1 IMPLANT
GLOVE SUPERSENSE BIOGEL SZ 8.5 (GLOVE) ×1
GOWN STRL REUS W/ TWL LRG LVL3 (GOWN DISPOSABLE) ×2 IMPLANT
GOWN STRL REUS W/TWL 2XL LVL3 (GOWN DISPOSABLE) ×4 IMPLANT
GOWN STRL REUS W/TWL LRG LVL3 (GOWN DISPOSABLE) ×2
INTRAOP MONITOR FEE IMPULS NCS (MISCELLANEOUS) ×1
INTRAOP MONITOR FEE IMPULSE (MISCELLANEOUS) ×1
KIT BASIN OR (CUSTOM PROCEDURE TRAY) ×2 IMPLANT
KIT TURNOVER KIT B (KITS) ×2 IMPLANT
NEEDLE HYPO 22GX1.5 SAFETY (NEEDLE) ×2 IMPLANT
NEEDLE SPNL 18GX3.5 QUINCKE PK (NEEDLE) ×2 IMPLANT
NS IRRIG 1000ML POUR BTL (IV SOLUTION) ×6 IMPLANT
PACK ORTHO CERVICAL (CUSTOM PROCEDURE TRAY) ×2 IMPLANT
PACK UNIVERSAL I (CUSTOM PROCEDURE TRAY) ×2 IMPLANT
PAD ARMBOARD 7.5X6 YLW CONV (MISCELLANEOUS) ×4 IMPLANT
PATTIES SURGICAL .25X.25 (GAUZE/BANDAGES/DRESSINGS) IMPLANT
PATTIES SURGICAL .5 X.5 (GAUZE/BANDAGES/DRESSINGS) IMPLANT
PIN DISTRACTION 14 (PIN) ×4 IMPLANT
SCREW ENDO BONE 3.8X14MM (Screw) ×8 IMPLANT
SCREW LOCKING 14MMX3.5MM (Screw) ×8 IMPLANT
SPONGE INTESTINAL PEANUT (DISPOSABLE) ×2 IMPLANT
SPONGE LAP 4X18 RFD (DISPOSABLE) IMPLANT
SPONGE SURGIFOAM ABS GEL 100 (HEMOSTASIS) ×2 IMPLANT
SURGIFLO W/THROMBIN 8M KIT (HEMOSTASIS) ×4 IMPLANT
SUT BONE WAX W31G (SUTURE) ×2 IMPLANT
SUT MON AB 3-0 SH 27 (SUTURE) ×1
SUT MON AB 3-0 SH27 (SUTURE) ×1 IMPLANT
SUT SILK 2 0 (SUTURE) ×1
SUT SILK 2-0 18XBRD TIE 12 (SUTURE) ×1 IMPLANT
SUT VIC AB 2-0 CT1 18 (SUTURE) ×4 IMPLANT
SYR BULB IRRIGATION 50ML (SYRINGE) ×2 IMPLANT
SYR CONTROL 10ML LL (SYRINGE) ×4 IMPLANT
TAPE CLOTH 4X10 WHT NS (GAUZE/BANDAGES/DRESSINGS) ×2 IMPLANT
TAPE UMBILICAL COTTON 1/8X30 (MISCELLANEOUS) ×2 IMPLANT
TOWEL GREEN STERILE (TOWEL DISPOSABLE) ×2 IMPLANT
TOWEL GREEN STERILE FF (TOWEL DISPOSABLE) ×2 IMPLANT
TRAY FOLEY MTR SLVR 16FR STAT (SET/KITS/TRAYS/PACK) ×2 IMPLANT

## 2018-08-31 NOTE — H&P (Signed)
There is been no change in the patient's clinical exam from her last office visit of 08/22/2018.  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  Surgical plan is a 4 level anterior cervical discectomy and fusion C3-7.  We have gone over the surgery as well as the risks and benefits and all of her and her husband's questions were addressed.

## 2018-08-31 NOTE — Progress Notes (Signed)
Pharmacy Antibiotic Note  Megan Lang is a 47 y.o. female admitted on 08/31/2018 with surgical prophylaxis.    Plan: Vanc 1 g q12h  Monitor LOT renal fx in am  Height: 5\' 2"  (157.5 cm) Weight: 180 lb (81.6 kg) IBW/kg (Calculated) : 50.1  Temp (24hrs), Avg:98.6 F (37 C), Min:98.4 F (36.9 C), Max:98.8 F (37.1 C)  No results for input(s): WBC, CREATININE, LATICACIDVEN, VANCOTROUGH, VANCOPEAK, VANCORANDOM, GENTTROUGH, GENTPEAK, GENTRANDOM, TOBRATROUGH, TOBRAPEAK, TOBRARND, AMIKACINPEAK, AMIKACINTROU, AMIKACIN in the last 168 hours.  Estimated Creatinine Clearance: 79.1 mL/min (by C-G formula based on SCr of 0.87 mg/dL).    Allergies  Allergen Reactions  . Moxifloxacin Swelling    other  . Clindamycin Diarrhea and Other (See Comments)    GI upset  . Codeine Other (See Comments)    TACHYCARDIA   . Levofloxacin Other (See Comments)    other  . Metronidazole Diarrhea  . Minocin [Minocycline Hcl] Other (See Comments)    Stomach Pains  . Penicillins Rash    Has patient had a PCN reaction causing immediate rash, facial/tongue/throat swelling, SOB or lightheadedness with hypotension:yes Has patient had a PCN reaction causing severe rash involving mucus membranes or skin necrosis:no Has patient had a PCN reaction that required hospitalization:no Has patient had a PCN reaction occurring within the last 10 years:No If all of the above answers are "NO", then may proceed with Cephalosporin   Levester Fresh, PharmD, BCPS, BCCCP Clinical Pharmacist [phone]  Please check AMION for all [place] numbers  08/31/2018 6:46 PM

## 2018-08-31 NOTE — Progress Notes (Signed)
Pt arrived from pacu AAOx4. Pain 8/10. Cervical collar on. scds on and teds on. VSS. Bed in low positions, call be in reach

## 2018-08-31 NOTE — Op Note (Signed)
Operative report  Preoperative diagnosis: Cervical spondylitic myelopathy  Postoperative diagnosis: Same     Operative procedure: Anterior cervical discectomy and fusion C3-7.  Intraoperative neuro monitoring: SSEP and evoked motor potentials remained normal throughout the case.  No evidence of spinal cord deterioration.  EMG activity remained normal throughout the case.  No abnormal activity noted.  Implants: Medtronic/Titan spine: C3-4: Small 7 mm lordotic cage.  Affixed with 14 mm 3.8 diameter screws X2          C4-5: Small 6 mm lordotic cage.  Fixed with 14 mm 3.5 diameter screws x2         C5-6: Small 7 mm lordotic cage.  Affixed with 14 mm 3.5 diameter screws X2                                C6 7: Medium 7 mm lordotic cage.  Affixed with 14 mm 3.8 diameter screws X2  Allograft: vivogen  Indications: This is a very pleasant 47 year old woman with progressive neck and radicular arm pain.  Clinical exam confirmed myelopathic findings.  Imaging studies demonstrated cord compression with signal change along with multilevel degenerative disc disease and foraminal/lateral recess stenosis.  Because of the progressive nature of the myelopathy we elected to move forward with surgery.  All appropriate risks benefits and alternatives were discussed with the patient and consent was obtained.  Operative report: Patient is brought the operating room placed upon the operating room table.  After successful induction of general anesthesia and endotracheal the patient teds SCDs and a Foley were inserted.  Her cervical spine was then properly positioned for surgery.  The neuro monitoring representative then applied all of the appropriate needles and pads for intraoperative SSEP, evoked motor potentials, and free running EMG monitoring.  The anterior cervical spine was then prepped and draped in a standard fashion.  A timeout was taken to confirm patient procedure and all other important data.  X-ray was used  to identify the C3 and C7 vertebral bodies.  I marked out those levels and then marked out a longitudinal incision just medial to the left sternocleidomastoid muscle.  This would be a standard Smith-Robinson approach to the anterior cervical spine.  The incision site was infiltrated with quarter percent Marcaine with epinephrine and then the skin was incised.  I sharply dissected down to the platysma.  I isolated the platysma and then sacrificed.  The external jugular was now quite visible and protected.  I dissected along the medial border into the deep cervical fascia.  Identified and isolated the omohyoid muscle and sacrificed this for better visualization.  I continued my sharp dissection through the deep cervical and prevertebral fascia.  At this point I was able to sweep the esophagus and trachea to the right and protected with a finger and then protect the carotid sheath laterally with my finger.  Using [employer] I continued my dissection completely exposed from the superior aspect of C3 to the inferior aspect to see 7.  This provided me excellent visualization.  I was also able to easily mobilize the esophagus.  At this point I placed the needle into the C3-4 disc space and took an x-ray to confirm I was at the appropriate level.  Once confirmed I then proceeded with my surgical procedure.  Using bipolar electrocautery to mobilize the longus coli muscle from the superior aspect of C3 to the inferior aspect of C7.  Once  this was done bilaterally I was able to place 9 self-retaining retractor under the longus coli muscle deflate the endotracheal cuff and expanded retracted to the appropriate width.  I reinflated the endotracheal cuff.  An annulotomy was performed at C3-4 with a 15 blade scalpel and I used pituitary rongeur to resect the bulk of the disc material.  A 2 mm Kerrison rongeur was used to take down the overhanging osteophyte from the inferior aspect of C3.  I then used my curettes to  resect the remaining disc materi until the subchondral bleeding bone was exposed.  I continue to work posteriorly and eventually identify the annulus.  Using a fine nerve hook I dissected underneath the posterior annulus and resected it with my Kerrison punch.  This also allowed me to resect the osteophyte from the posterior aspect of the vertebral bodies.  At this point the PLL was visualized.  Again using a nerve hook I dissected underneath the PLL and resected it to expose the thecal sac.  This also allowed me to undercut the uncovertebral joint and address the lateral recess/foraminal stenosis.  At this point with the discectomy and decompression complete I trialed the intervertebral wrap spacers and elected to use the size 7 small lordotic cage.  This was obtained and packed with allograft and malleted to the appropriate depth.  I had excellent fixation and intervertebral fit.  To decrease the potential risk of postoperative dysphasia I elected to use a integrated cage screw system rather than the anterior plating system.  3.8 mm locking screws were then used to affix into the body of C3 and C4.  Both screws had excellent fixation.  Distraction pins were repositioned as well.  Using the same technique as used at C3-4 I performed an annulotomy and resected the disc material.  I directed the disc space using a lamina spreader and maintain the distraction with the distraction pin set.  This allowed me to continue to work posteriorly with my curettes resecting the disc and exposing the subchondral bone.  I again dissected underneath the posterior annulus and posterior longitudinal ligament tear resected.  The hard disc osteophyte that was seen on the MRI was exposed and removed.  I again took down the PLL and undercut the uncovertebral joints to adequately decompress the foramen with a discectomy complete I rasped the endplates and trialed and used a 6 small lordotic spacer.  This is obtained and packed with  allograft.  It was malleted into position without difficulty.  An excellent intervertebral fit.  2 locking screws were then used to affix it into the body of C4 and 5.  These were 14 mm length 3.5 diameter.  With the discectomy at 4 5 completed I then repositioned at 5 6.  Distraction pins were placed into the body of 5 and 7 and I could clearly visualize both levels.  An annulotomy was performed at 5 6 and using the same technique I removed all of the disc material.  I again took my dissection using my curettes posteriorly.  Using a fine nerve hook I was able to dissect under the posterior annulus and posterior longitudinal ligament to excise the large osteophyte.  This was consistent with what was seen on the preoperative MRI.  Once the osteophyte/bone spur was removed I could then continue to take down the posterior longitudinal ligament with number 1 Kerrison.  With the posterior decompression as well as the uncovertebral joint decompression complete I then measured and placed a 7 small  lordotic cage.  I again I got excellent fixation and I utilized to 14 mm by 3.5 diameter screws.  The C6-7 level was visualized in the annulotomy performed.  I again using the same technique performed a discectomy at this level.  Curettes and Kerrison rongeurs were used to resect all the disc material and eventually take down the posterior annulus.  Nerve hook was used to develop a plane underneath the posterior longitudinal ligament which allowed me to resect this and remove the right sided bone spur consistently what was seen on imaging studies.  With the decompression complete I rasp and placed a 7 medium lordotic cage at this level.  This was affixed with two 3.8 millimeter screws.  All screws had excellent purchase.  At this point I have completed the discectomy and fusions.  The wound was copiously irrigated with normal saline and using FloSeal and bipolar electrocautery I obtained hemostasis.  Because of the length  of the surgery I elected to place a drain.  I did discuss with anesthesia having the patient remain intubated but it was felt as though she was breathing on her own had a positive air leak and so she was safe to be extubated after irrigating and placing my drain bringing out through a separate stab incision I then closed the platysma with interrupted 2-0 Vicryl suture, and the skin with 3-0 Monocryl.  Steri-Strips and a dry dressing in the Aspen collar were applied.  The patient was ultimately extubated and transferred the PACU without incident.  The end of the case all needle sponge counts were correct.  There were no adverse intraoperative events.

## 2018-08-31 NOTE — Anesthesia Postprocedure Evaluation (Signed)
Anesthesia Post Note  Patient: Megan Lang  Procedure(s) Performed: ANTERIOR CERVICAL DECOMPRESSION/DISCECTOMY FUSION C3-7 (N/A Neck)     Patient location during evaluation: PACU Anesthesia Type: General Level of consciousness: awake and alert Pain management: pain level controlled Vital Signs Assessment: post-procedure vital signs reviewed and stable Respiratory status: spontaneous breathing, nonlabored ventilation, respiratory function stable and patient connected to nasal cannula oxygen Cardiovascular status: stable and tachycardic Postop Assessment: no apparent nausea or vomiting Anesthetic complications: no    Last Vitals:  Vitals:   08/31/18 1555 08/31/18 1625  BP: (!) 158/99 (!) 157/95  Pulse: (!) 108 (!) 109  Resp: (!) 25 (!) 24  Temp: 37.1 C   SpO2: 100% 99%    Last Pain:  Vitals:   08/31/18 1710  TempSrc:   PainSc: [place]

## 2018-08-31 NOTE — Transfer of Care (Signed)
Immediate Anesthesia Transfer of Care Note  Patient: Megan Lang  Procedure(s) Performed: ANTERIOR CERVICAL DECOMPRESSION/DISCECTOMY FUSION C3-7 (N/A Neck)  Patient Location: PACU  Anesthesia Type:General  Level of Consciousness: awake, alert  and oriented  Airway & Oxygen Therapy: Patient Spontanous Breathing and Patient connected to face mask oxygen  Post-op Assessment: Report given to RN and Post -op Vital signs reviewed and stable  Post vital signs: Reviewed and stable  Last Vitals:  Vitals Value Taken Time  BP 158/99 08/31/2018  3:53 PM  Temp    Pulse 108 08/31/2018  3:55 PM  Resp 25 08/31/2018  3:55 PM  SpO2 100 % 08/31/2018  3:55 PM  Vitals shown include unvalidated device data.  Last Pain:  Vitals:   08/31/18 0724  TempSrc:   PainSc: 0-No pain         Complications: No apparent anesthesia complications

## 2018-08-31 NOTE — Brief Op Note (Signed)
08/31/2018  3:49 PM  PATIENT:  Megan Lang  47 y.o. female  PRE-OPERATIVE DIAGNOSIS:  Cervical myelopathy  POST-OPERATIVE DIAGNOSIS:  Cervical myelopathy  PROCEDURE:  Procedure(s) with comments: ANTERIOR CERVICAL DECOMPRESSION/DISCECTOMY FUSION C3-7 (N/A) - 6 hrs  SURGEON:  Surgeon(s) and Role:    Melina Schools, MD - Primary  PHYSICIAN ASSISTANT:   ASSISTANTS: none   ANESTHESIA:   general  EBL:  20 mL   BLOOD ADMINISTERED:none  DRAINS: (1) TLS Drain(s) to suction in the anterior cervical spine   LOCAL MEDICATIONS USED:  MARCAINE     SPECIMEN:  No Specimen  DISPOSITION OF SPECIMEN:  N/A  COUNTS:  YES  TOURNIQUET:  * No tourniquets in log *  DICTATION: .Dragon Dictation  PLAN OF CARE: Admit to inpatient   PATIENT DISPOSITION:  PACU - hemodynamically stable.

## 2018-08-31 NOTE — Anesthesia Procedure Notes (Addendum)
Procedure Name: Intubation Date/Time: 08/31/2018 9:28 AM Performed by: Trinna Post., CRNA Pre-anesthesia Checklist: Patient identified, Emergency Drugs available, Suction available, Patient being monitored and Timeout performed Patient Re-evaluated:Patient Re-evaluated prior to induction Oxygen Delivery Method: Circle system utilized Preoxygenation: Pre-oxygenation with 100% oxygen Induction Type: IV induction Ventilation: Mask ventilation without difficulty Laryngoscope Size: Glidescope and 3 Grade View: Grade I Tube type: Oral Tube size: 7.0 mm Number of attempts: 1 Airway Equipment and Method: Video-laryngoscopy,  Rigid stylet and Bite block (Soft bite block placed per neuro monitor tech) Placement Confirmation: ETT inserted through vocal cords under direct vision,  positive ETCO2 and breath sounds checked- equal and bilateral Secured at: 19 cm Tube secured with: Tape Dental Injury: Teeth and Oropharynx as per pre-operative assessment

## 2018-09-01 ENCOUNTER — Other Ambulatory Visit (HOSPITAL_COMMUNITY): Payer: BC Managed Care – PPO

## 2018-09-01 LAB — CREATININE, SERUM
CREATININE: 0.77 mg/dL (ref 0.44–1.00)
GFR calc Af Amer: >60 mL/min (ref >60)

## 2018-09-01 MED FILL — Thrombin (Recombinant) For Soln 20000 Unit: CUTANEOUS | Qty: 1 | Status: AC

## 2018-09-01 NOTE — Social Work (Signed)
CSW acknowledging consult for SNF placement. Will follow for therapy recommendations.   Sven Pinheiro H Venna Berberich, LCSWA [place] Clinical Social Work [phone]   

## 2018-09-01 NOTE — Evaluation (Signed)
Occupational Therapy Evaluation Patient Details Name: Megan Lang MRN: [id number] DOB: 10-Jun-1971 Today's Date: 09/01/2018    History of Present Illness 47 yo admitted for C3-7 ACDF with neck pain and tricep weakness. PMHx: left RCR, diverticulitis   Clinical Impression   This 47 y/o female presents with the above. PTA pt reports independence with functional mobility, reports spouse was assisting with ADLs due to recent rotator cuff surgery this past summer (LUE). Pt performing functional mobility without AD with overall minguard assist. Pt currently requires overall minA for UB, LB and toileting ADLs. Educated pt regarding cervical precautions, safety and compensatory strategies for performing ADL with pt demonstrating good carryover throughout session. Pt will return home with spouse who she reports is able to assist with ADLs/iADLs PRN. Questions answered throughout. Will continue to follow while she remains in acute setting to maximize her overall safety and independence with ADLs and mobility prior to discharge home.     Follow Up Recommendations  No OT follow up;Supervision/Assistance - 24 hour    Equipment Recommendations  None recommended by OT           Precautions / Restrictions Precautions Precautions: Cervical Precaution Booklet Issued: Yes (comment) Required Braces or Orthoses: Cervical Brace Cervical Brace: At all times Restrictions Weight Bearing Restrictions: No      Mobility Bed Mobility Overal bed mobility: Needs Assistance Bed Mobility: Rolling;Sidelying to Sit Rolling: Min guard Sidelying to sit: Min guard       General bed mobility comments: OOB in recliner upon arrival  Transfers Overall transfer level: Needs assistance Equipment used: None Transfers: Sit to/from Stand Sit to Stand: Supervision         General transfer comment: cues for posture and safety    Balance Overall balance assessment: Needs assistance   Sitting balance-Leahy  Scale: Normal     Standing balance support: No upper extremity supported;During functional activity Standing balance-Leahy Scale: Good   Single Leg Stance - Right Leg: 1 Single Leg Stance - Left Leg: 1 Tandem Stance - Right Leg: 10 Tandem Stance - Left Leg: 10   Rhomberg - Eyes Closed: 20   High Level Balance Comments: pt with difficulty with single limb stance            ADL either performed or assessed with clinical judgement   ADL Overall ADL's : Needs assistance/impaired Eating/Feeding: Set up;Sitting   Grooming: Oral care;Min guard;Sitting;Wash/dry face Grooming Details (indicate cue type and reason): pt using compensatory strategies to perform oral care this session Upper Body Bathing: Min guard;Sitting   Lower Body Bathing: Minimal assistance;Sit to/from stand Lower Body Bathing Details (indicate cue type and reason): requires assist to wash buttocks, for thoroughness and to maintain precautions Upper Body Dressing : Minimal assistance;Sitting   Lower Body Dressing: Minimal assistance;Sit to/from stand Lower Body Dressing Details (indicate cue type and reason): assist to don TEDs, pt reports she donned on her own this AM; pt able to perform figure 4 technique to doff footwear during LB bathing task Toilet Transfer: Min guard;Ambulation;Regular Glass blower/designer Details (indicate cue type and reason): close minguard for safety; pt slow and guarded with movements Toileting- Clothing Manipulation and Hygiene: Min guard;Sit to/from stand;Sitting/lateral lean Toileting - Clothing Manipulation Details (indicate cue type and reason): pt performing peri-care after voiding bladder via lateral leans; performs clothing mangagement with minguard assist in standing; pt noted to have started her menstrual cycle, provided necessary items to address during session     Functional mobility during ADLs:  Min guard General ADL Comments: initiated education regarding safety and  compensatory methods for completing ADLs; pt with good carryover; assisted pt to wash up at sink during session     Vision         Perception     Praxis      Pertinent Vitals/Pain Pain Assessment: 0-10 Pain Score: 4  Pain Location: incision Pain Descriptors / Indicators: Sore Pain Intervention(s): Limited activity within patient's tolerance;Monitored during session     Hand Dominance Left   Extremity/Trunk Assessment Upper Extremity Assessment Upper Extremity Assessment: LUE deficits/detail LUE Deficits / Details: limited ROM due to recent rotator cuff sx this past summer (June); able to complete AROM shoulder flex 0-approx 80*. Pt able to reach and wash RUE using LUE though reports it is effortful. Pt denies numbness/tingling, demonstrates good fine motor use LUE Sensation: WNL   Lower Extremity Assessment Lower Extremity Assessment: Defer to PT evaluation   Cervical / Trunk Assessment Cervical / Trunk Assessment: Other exceptions Cervical / Trunk Exceptions: post surgical, collar   Communication Communication Communication: No difficulties   Cognition Arousal/Alertness: Awake/alert Behavior During Therapy: WFL for tasks assessed/performed Overall Cognitive Status: Within Functional Limits for tasks assessed                                     General Comments       Exercises     Shoulder Instructions      Home Living Family/patient expects to be discharged to:: Private residence Living Arrangements: Spouse/significant other Available Help at Discharge: Family;Available 24 hours/day Type of Home: House Home Access: Stairs to enter [employer] of Steps: 1   Home Layout: Two level;Able to live on main level with bedroom/bathroom     Bathroom Shower/Tub: Occupational psychologist: Standard     Home Equipment: Shower seat - built in          Prior Functioning/Environment Level of Independence: Independent         Comments: has required some assist from spouse recently due to Left RCR and change in function. 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, go to the movies OT Goal Formulation: With patient Time For Goal Achievement: 09/15/18 Potential to Achieve Goals: Good  OT Frequency: Min 2X/week   Barriers to D/C:            Co-evaluation              AM-PAC PT "6 Clicks" Daily Activity     Outcome Measure Help from another person eating meals?: None Help from another person taking care of personal grooming?: None Help from another person toileting, which includes using toliet, bedpan, or urinal?: A Little Help from another person bathing (including washing, rinsing, drying)?: A Little Help from another person to put on and taking off regular upper body clothing?: A Little Help from another person to put on and taking off regular lower body clothing?: A Little 6 Click Score: 20   End of Session Equipment Utilized During Treatment: Cervical collar Nurse Communication:  Mobility status  Activity Tolerance: Patient tolerated treatment well Patient left: in chair;with call bell/phone within reach;with family/visitor present  OT Visit Diagnosis: Other abnormalities of gait and mobility (R26.89)                Time: 7169-6789 OT Time Calculation (min): 44 min Charges:  OT General Charges $OT Visit: 1 Visit OT Evaluation $OT Eval Low Complexity: 1 Low OT Treatments $Self Care/Home Management : 23-37 mins  Lou Cal,  OT Supplemental Rehabilitation Services Pager [phone] Office [place] 09/01/2018, 10:49 AM

## 2018-09-01 NOTE — Evaluation (Addendum)
Physical Therapy Evaluation Patient Details Name: Megan Lang MRN: [id number] DOB: 07-12-1971 Today's Date: 09/01/2018   History of Present Illness  47 yo admitted for C3-7 ACDF with neck pain and tricep weakness. PMHx: left RCR, diverticulitis  Clinical Impression  Pt pleasant in bed on arrival. Pt with spouse feeding her in bed and educated for need for pt to move her arms, care for herself and do what she can within her precautions. Pt with decreased ability with transfers, balance and cautious with gait who will benefit from acute therapy to maximize mobility, function and independence to decrease burden of care. Handout provided with pt and spouse educated for restrictions and collar wear. Pt encouraged to walk throughout the day with spouse and staff.     Follow Up Recommendations Outpatient PT    Equipment Recommendations  None recommended by PT    Recommendations for Other Services OT consult     Precautions / Restrictions Precautions Precautions: Cervical Precaution Booklet Issued: Yes (comment) Required Braces or Orthoses: Cervical Brace Cervical Brace: At all times      Mobility  Bed Mobility Overal bed mobility: Needs Assistance Bed Mobility: Rolling;Sidelying to Sit Rolling: Min guard Sidelying to sit: Min guard       General bed mobility comments: cues for sequence with increased time  Transfers Overall transfer level: Needs assistance   Transfers: Sit to/from Stand Sit to Stand: Supervision         General transfer comment: cues for posture and safety  Ambulation/Gait Ambulation/Gait assistance: Supervision Gait Distance (Feet): 500 Feet Assistive device: None Gait Pattern/deviations: Step-through pattern   Gait velocity interpretation: >4.37 ft/sec, indicative of normal walking speed General Gait Details: pt with decreased arm swing and trunk rotation, cautious with gait with cues to relax posture.   Stairs Stairs: Yes Stairs  assistance: Independent Stair Management: Forwards Number of Stairs: 1    Wheelchair Mobility    Modified Rankin (Stroke Patients Only)       Balance Overall balance assessment: Needs assistance   Sitting balance-Leahy Scale: Normal       Standing balance-Leahy Scale: Good   Single Leg Stance - Right Leg: 1 Single Leg Stance - Left Leg: 1 Tandem Stance - Right Leg: 10 Tandem Stance - Left Leg: 10   Rhomberg - Eyes Closed: 20   High Level Balance Comments: pt with difficulty with single limb stance              Pertinent Vitals/Pain Pain Assessment: 0-10 Pain Score: 4  Pain Location: incision Pain Descriptors / Indicators: Sore Pain Intervention(s): Monitored during session;Limited activity within patient's tolerance;Premedicated before session;Repositioned    Home Living Family/patient expects to be discharged to:: Private residence Living Arrangements: Spouse/significant other Available Help at Discharge: Family;Available 24 hours/day Type of Home: House Home Access: Stairs to enter   [employer] of Steps: 1 Home Layout: Two level;Able to live on main level with bedroom/bathroom Home Equipment: Shower seat - built in      Prior Function Level of Independence: Independent         Comments: has required some assist from spouse recently due to Left RCR and change in function. Spouse does the house work normally     Journalist, newspaper        Extremity/Trunk Assessment   Upper Extremity Assessment Upper Extremity Assessment: Defer to OT evaluation    Lower Extremity Assessment Lower Extremity Assessment: Overall WFL for tasks assessed(5/5 bil LE strength with sensation WNL)  Cervical / Trunk Assessment Cervical / Trunk Assessment: Other exceptions Cervical / Trunk Exceptions: post surgical, collar  Communication   Communication: No difficulties  Cognition Arousal/Alertness: Awake/alert Behavior During Therapy: WFL for tasks  assessed/performed Overall Cognitive Status: Within Functional Limits for tasks assessed                                        General Comments      Exercises     Assessment/Plan    PT Assessment Patient needs continued PT services  PT Problem List Decreased balance;Decreased activity tolerance;Decreased mobility;Decreased strength       PT Treatment Interventions Gait training;Therapeutic activities;Neuromuscular re-education;Balance training;Patient/family education;Functional mobility training    PT Goals (Current goals can be found in the Care Plan section)  Acute Rehab PT Goals 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 : A Little Difficulty sitting down on and standing up from a chair with arms (e.g., wheelchair, bedside commode, etc,.)?: A Little Help needed moving to and from a bed to chair (including a wheelchair)?: None Help needed walking in hospital room?: None Help needed climbing 3-5 steps with a railing? : None 6 Click Score: 21    End of Session Equipment Utilized During Treatment: Gait belt;Cervical collar Activity Tolerance: Patient tolerated treatment well Patient left: in chair;with call bell/phone within reach;with family/visitor present Nurse Communication: Mobility status PT Visit Diagnosis: Other abnormalities of gait and mobility (R26.89)    Time: 3383-2919 PT Time Calculation (min) (ACUTE ONLY): 27 min   Charges:   PT Evaluation $PT Eval Moderate Complexity: 1 Mod PT  Treatments $Therapeutic Activity: 8-22 mins        Pena, PT Acute Rehabilitation Services Pager: [phone] Office: [phone]   Remon Quinto B Jamen Loiseau 09/01/2018, 9:31 AM

## 2018-09-01 NOTE — Plan of Care (Signed)
  Problem: Pain Managment: Goal: General experience of comfort will improve Outcome: Progressing   Problem: Clinical Measurements: Goal: Ability to maintain clinical measurements within normal limits will improve Outcome: Progressing   Problem: Education: Goal: Knowledge of General Education information will improve Description Including pain rating scale, medication(s)/side effects and non-pharmacologic comfort measures Outcome: Progressing

## 2018-09-01 NOTE — Progress Notes (Signed)
    Subjective: Procedure(s) (LRB): ANTERIOR CERVICAL DECOMPRESSION/DISCECTOMY FUSION C3-7 (N/A) 1 Day Post-Op  Patient reports pain as 2 on 0-10 scale.  Reports decreased arm pain reports incisional neck pain   Positive void Negative bowel movement Positive flatus Negative chest pain or shortness of breath  Objective: Vital signs in last 24 hours: Temp:  [98 F (36.7 C)-99.9 F (37.7 C)] 98.8 F (37.1 C) (11/15 0816) Pulse Rate:  [95-111] 98 (11/15 0816) Resp:  [22-25] 22 (11/14 1725) BP: (148-163)/(95-101) 163/99 (11/15 0923) SpO2:  [98 %-100 %] 98 % (11/15 0816)  Intake/Output from previous day: 11/14 0701 - 11/15 0700 In: 1524 [I.V.:1500] Out: 3482 [Urine:3450; Drains:12; Blood:20]  Labs: No results for input(s): WBC, RBC, HCT, PLT in the last 72 hours. Recent Labs    09/01/18 0448  CREATININE 0.77   No results for input(s): LABPT, INR in the last 72 hours.  Physical Exam: Neurologically intact ABD soft Intact pulses distally Incision: dressing C/D/I Compartment soft Body mass index is 32.92 kg/m.  Assessment/Plan: Patient stable  xrays n/a Mobilization with physical therapy Encourage incentive spirometry Continue care  Advance diet Up with therapy  1. Minimal output from drain will d/c today 2. Ambulation with assistance  3. D/c to home this afternoon or in AM  Melina Schools, MD [facility] [phone]

## 2018-09-02 NOTE — Progress Notes (Signed)
Pt discharged with spouse. Helped into passenger seat of vehicle and secured in place. Discharge Instructions reviewed with patient including not showering for 5 days , leaving the dressing intact, not driving for 2 weeks and not picking up anything over 5 pounds. Aware to not do housework until MD clears her to do so. Awake , aware , alert and oriented at DC. Pre medicated with pain meds prior to DC for ride home.Spouse and pt. Made aware pateint took pain meds @ approx 0915.

## 2018-09-02 NOTE — Progress Notes (Signed)
Physical Therapy Treatment Patient Details Name: Megan Lang MRN: [id number] DOB: 1971-04-05 Today's Date: 09/02/2018    History of Present Illness 47 yo admitted for C3-7 ACDF with neck pain and tricep weakness. PMHx: left RCR, diverticulitis    PT Comments    Pt very pleasant, standing in room on arrival. Pt with improved transfers and gait with increased stability and not as tense today. Pt educated for bil shoulder motion, cervical precautions and mobility. Pt encouraged to continue walking program. Pt safe for D/C home.    Follow Up Recommendations  Outpatient PT     Equipment Recommendations  None recommended by PT    Recommendations for Other Services       Precautions / Restrictions Precautions Precautions: Cervical Required Braces or Orthoses: Cervical Brace Cervical Brace: At all times;Other (comment)(off in bed)    Mobility  Bed Mobility Overal bed mobility: Modified Independent Bed Mobility: Rolling;Sidelying to Sit;Sit to Sidelying              Transfers Overall transfer level: Independent                  Ambulation/Gait Ambulation/Gait assistance: Modified independent (Device/Increase time) Gait Distance (Feet): 500 Feet Assistive device: None Gait Pattern/deviations: Step-through pattern   Gait velocity interpretation: >2.62 ft/sec, indicative of community ambulatory General Gait Details: pt with decreased arm swing and trunk rotation, cautious with gait with cues to relax posture.    Stairs             Wheelchair Mobility    Modified Rankin (Stroke Patients Only)       Balance Overall balance assessment: Mild deficits observed, not formally tested                                          Cognition Arousal/Alertness: Awake/alert Behavior During Therapy: WFL for tasks assessed/performed Overall Cognitive Status: Within Functional Limits for tasks assessed                                         Exercises      General Comments        Pertinent Vitals/Pain Pain Score: 3  Pain Location: incision, bil scapula Pain Descriptors / Indicators: Aching Pain Intervention(s): Limited activity within patient's tolerance;Repositioned;Monitored during session;Premedicated before session    Home Living                      Prior Function            PT Goals (current goals can now be found in the care plan section) Progress towards PT goals: Progressing toward goals    Frequency           PT Plan Current plan remains appropriate    Co-evaluation              AM-PAC PT "6 Clicks" Daily Activity  Outcome Measure  Difficulty turning over in bed (including adjusting bedclothes, sheets and blankets)?: None Difficulty moving from lying on back to sitting on the side of the bed? : None Difficulty sitting down on and standing up from a chair with arms (e.g., wheelchair, bedside commode, etc,.)?: None Help needed moving to and from a bed to chair (including a wheelchair)?: None Help needed walking in  hospital room?: None Help needed climbing 3-5 steps with a railing? : None 6 Click Score: 24    End of Session Equipment Utilized During Treatment: Cervical collar Activity Tolerance: Patient tolerated treatment well Patient left: in chair;with call bell/phone within reach Nurse Communication: Mobility status PT Visit Diagnosis: Other abnormalities of gait and mobility (R26.89)     Time: 9024-0973 PT Time Calculation (min) (ACUTE ONLY): 14 min  Charges:  $Gait Training: 8-22 mins                     Jane Pager: [phone] Office: [place] 09/02/2018, 8:45 AM

## 2018-09-02 NOTE — Progress Notes (Signed)
Subjective: 2 Days Post-Op Procedure(s) (LRB): ANTERIOR CERVICAL DECOMPRESSION/DISCECTOMY FUSION C3-7 (N/A) Patient reports pain as mild.  Mild neck pain and mild sore throat. No difficulty swallowing  Objective: Vital signs in last 24 hours: Temp:  [97.6 F (36.4 C)-99.9 F (37.7 C)] 99 F (37.2 C) (11/16 0300) Pulse Rate:  [78-96] 88 (11/16 0300) Resp:  [16-18] 16 (11/16 0300) BP: (147-163)/(89-101) 152/99 (11/16 0300) SpO2:  [97 %-100 %] 98 % (11/16 0300)  Intake/Output from previous day: 11/15 0701 - 11/16 0700 In: 483 [P.O.:480; I.V.:3] Out: 23 [Urine:3; Drains:20] Intake/Output this shift: No intake/output data recorded.  No results for input(s): HGB in the last 72 hours. No results for input(s): WBC, RBC, HCT, PLT in the last 72 hours. Recent Labs    09/01/18 0448  CREATININE 0.77   No results for input(s): LABPT, INR in the last 72 hours.  Neurologically intact Incision: dressing C/D/I No cellulitis present   Assessment/Plan: 2 Days Post-Op Procedure(s) (LRB): ANTERIOR CERVICAL DECOMPRESSION/DISCECTOMY FUSION C3-7 (N/A) Discharge home    Gaynelle Arabian 09/02/2018, 8:40 AM

## 2018-09-02 NOTE — Discharge Instructions (Signed)
° ° ° ° ° °Today you will be discharged from the hospital.  The purpose of the following handout is to help guide you over the next 2 weeks.  First and foremost, be sure you have a follow up appointment with Dr. Samarion Ehle 2 weeks from the time of your surgery to have your sutures removed.  Please call [facility] [phone] to schedule or confirm this appointment.   ° ° ° °Brace °You do not have to wear the collar while lying in bed or sitting in a high-backed chair, eating, sleeping or showering.  Other than these instances, you must wear the brace.  You may NOT wear the collar while driving a vehicle (see driving restrictions below).  It is advisable that you wear the collar in public places or while traveling in a car as a passenger.  Dr. Blayton Huttner will discuss further use of the collar at your 2 week postop visit. ° °Wound Care °You may SHOWER 5 days from the date of surgery.  Shower directly over the steri-strips.  DO NOT scrub or submerge (bath tub, swimming pool, hot tub, etc.) the area.  Pat to dry following your shower.  There is no need for additional dressings other than the steri-strips.  Allow the steri-strips to fall off on their own.  Once the strips have fallen off, you may leave the area undressed.  DO NOT apply lotion/cream/ointment to the area.  The wound must remain dry at all times other than while showering.  Dr. Idali Lafever or his staff will remove your stiches at your first postop visit and give you additional instructions regarding wound care at that time.  ° °Activity °NO DRIVING FOR 2 WEEKS.  No lifting over 5 pounds (approximately a gallon of milk).  No bending, stooping, squatting or twisting.  No overhead activities.  We encourage you to walk (short distances and often throughout the day) as you can tolerate.  A good rule of thumb is to get up and move once or twice every hour.  You may go up and down stairs carefully.  As you continue to recover, Dr. Amaurie Schreckengost will address and  adjust restrictions to your activities until no further restrictions are needed.  However, until your first postop visit, when Dr. Ralphine Hinks can assess your recovery, you are to follow these instructions.  At the end of this document is a tentative outline of activities for up to 1 year.   ° ° ° ° °Medication °You will be discharged from the hospital with medication for pain, spasm, nausea and constipation.  You will be given enough medication to last until your first postop visit in 2 weeks.  Medications WILL NOT BE REFILLED EARLY; therefore, you are to take the medications only as directed.  If you have been given multiple prescriptions, please leave them with your pharmacy.  They can keep them on file for when you need them.  Medications that are lost or stolen WILL NOT be replaced.  We will address the need for continuing certain medications on an individual basis during your postop visit.  We ask that you avoid over the counter anti-inflammatory medications (Advil, Aleve, Motrin) for 3 months.   ° °What you can expect following neck surgery... °It is not uncommon to experience a sore throat or difficulty swallowing following neck surgery.  Cold liquids and soft foods are helpful in soothing this discomfort.  There is no specific diet that you are to follow after surgery, however, there are a   few things you should keep in mind to avoid unneeded discomfort.  Take small bites and eat slowly.  Chew your food thoroughly before swallowing.   It is not uncommon to experience incisional soreness or pain in the back of the neck, shoulders or between the shoulder blades.  These symptoms will slowly begin to resolve as you continue to recover, however, they can last for a few weeks.    It is not uncommon to experience INTERMITTENT arm pain following surgery.  This pain can mimic the arm pain you had prior to surgery.  As long as the pain resolves on its own and is not constant, there is no need to become alarmed.    When To Call If you experience fever >101F, loss of bowel or bladder control, painful swelling in the lower extremities, constant (unresolving) arm pain.  If you experience any of these symptoms, please call [facility] [phone].  What's Next As mentioned earlier, you will follow up with Dr. Rolena Infante in 2 weeks.  At that time, we will likely remove your stitches and discuss additional aspects of your recovery.                   ACTIVITY GUIDELINES ANTERIOR CERVICAL DISECTOMY AND FUSION  Activity Discharge 2 weeks 6 weeks 3 months 6 months 1 year  Shower 5 days        Submerge the wound  no no yes     Walking outdoors yes       Lifting 5 lbs yes       Climbing stairs yes       Cooking yes       Car rides (less than 30 minutes) yes       Car rides (greater than 30 minutes) no varies yes     Air travel no varies yes     Short outings [employer], visits, etc...) yes       School no no yes     Driving a car no no varies yes    Light upper extremity exercises no no varies yes    Stationary bike no no yes     Swimming (no diving) no no no varies yes   Vacuuming, laundry, mopping no no no varies yes   Biking outdoors no no no no varies yes  Light jogging no no no varies yes   Low impact aerobics no no no varies yes   Non-contact sports (tennis, golf) no no no varies yes   Hunting (no tree climbing) no no no varies yes   Dancing (non-gymnastics) no no no varies yes   Down-hill skiing (experienced skier) no no no no yes   Down-hill skiing (novice) no no no no yes   Cross-country skiing no no no no yes   Horseback riding (noncompetitive)  no no no no yes   Horseback riding (competitive) no no no no varies yes  Gardening/landscaping no no no varies yes   House repairs no no no varies varies yes  Lifting up to 50 lbs no no no no varies yes     PLEASE NOTE: I HAVE SENT SCRIPTS FOR PERCOCET/ROBAXIN/ZOFRAN TO PHARMACY ON RECORD.

## 2018-09-02 NOTE — Discharge Summary (Signed)
Patient ID: Megan Lang MRN: [id number] DOB/AGE: 07/11/1971 47 y.o.  Admit date: 08/31/2018 Discharge date: 09/02/2018  Admission Diagnoses:  Active Problems:   Cervical myelopathy [facility])   Discharge Diagnoses:  Active Problems:   Cervical myelopathy (HCC)  status post Procedure(s): ANTERIOR CERVICAL DECOMPRESSION/DISCECTOMY FUSION C3-7  Past Medical History:  Diagnosis Date  . Acid reflux   . Allergy   . Anxiety   . Aortic aneurysm ([place])   . Arthritis   . Asthma   . Cardiomegaly   . Carpal tunnel syndrome   . Depression   . Gastric polyp   . GERD (gastroesophageal reflux disease) 01/23/2016  . Hiatal hernia   . Hypertension   . Normal spontaneous vaginal delivery    3    Surgeries: Procedure(s): ANTERIOR CERVICAL DECOMPRESSION/DISCECTOMY FUSION C3-7 on 08/31/2018   Consultants:   Discharged Condition: Improved  Hospital Course: Megan Lang is an 47 y.o. female who was admitted 08/31/2018 for operative treatment of cervical myelopathy.. Patient failed conservative treatments (please see the history and physical for the specifics) and had severe unremitting pain that affects sleep, daily activities and work/hobbies. After pre-op clearance, the patient was taken to the operating room on 08/31/2018 and underwent  Procedure(s): ANTERIOR CERVICAL DECOMPRESSION/DISCECTOMY FUSION C3-7.    Patient was given perioperative antibiotics:  Anti-infectives (From admission, onward)   Start     Dose/Rate Route Frequency Ordered Stop   08/31/18 2000  vancomycin (VANCOCIN) IVPB 1000 mg/200 mL premix     1,000 mg 200 mL/hr over 60 Minutes Intravenous Every 12 hours 08/31/18 1845     08/31/18 0700  vancomycin (VANCOCIN) 1,500 mg in sodium chloride 0.9 % 500 mL IVPB     1,500 mg 250 mL/hr over 120 Minutes Intravenous To Surgery 08/30/18 0920 08/31/18 0930       Patient was given sequential compression devices and early ambulation to prevent DVT.   Patient benefited  maximally from hospital stay and there were no complications. At the time of discharge, the patient was urinating/moving their bowels without difficulty, tolerating a regular diet, pain is controlled with oral pain medications and they have been cleared by PT/OT.   Hospital course was unremarkable.  No difficulty breathing or swelling in the neck.  Ambulating and tolerating PO diet.  Patient voiding spontaneously.  Recent vital signs:  Patient Vitals for the past 24 hrs:  BP Temp Temp src Pulse Resp SpO2  09/02/18 0300 (!) 152/99 99 F (37.2 C) Oral 88 16 98 %  09/02/18 0014 (!) 148/99 99.4 F (37.4 C) Oral 90 18 97 %  09/01/18 2204 (!) 156/101 97.6 F (36.4 C) Oral 78 - 97 %  09/01/18 1706 (!) 147/99 98.6 F (37 C) - 90 - 98 %  09/01/18 1217 (!) 150/89 99.9 F (37.7 C) - 96 - 100 %  09/01/18 0923 (!) 163/99 - - - - -  09/01/18 0816 (!) 157/96 98.8 F (37.1 C) - 98 - 98 %     Recent laboratory studies:  Recent Labs    09/01/18 0448  CREATININE 0.77     Discharge Medications:   Allergies as of 09/02/2018      Reactions   Moxifloxacin Swelling   other   Clindamycin Diarrhea, Other (See Comments)   GI upset   Codeine Other (See Comments)   TACHYCARDIA   Levofloxacin Other (See Comments)   other   Metronidazole Diarrhea   Minocin [minocycline Hcl] Other (See Comments)   Stomach Pains  Penicillins Rash   Has patient had a PCN reaction causing immediate rash, facial/tongue/throat swelling, SOB or lightheadedness with hypotension:yes Has patient had a PCN reaction causing severe rash involving mucus membranes or skin necrosis:no Has patient had a PCN reaction that required hospitalization:no Has patient had a PCN reaction occurring within the last 10 years:No If all of the above answers are "NO", then may proceed with Cephalosporin      Medication List    STOP taking these medications   acetaminophen 500 MG tablet Commonly known as:  TYLENOL   cyclobenzaprine 10 MG  tablet Commonly known as:  FLEXERIL   ibuprofen 600 MG tablet Commonly known as:  ADVIL,MOTRIN   meloxicam 15 MG tablet Commonly known as:  MOBIC   MULTIVITAMIN GUMMIES ADULT PO   naproxen 500 MG tablet Commonly known as:  NAPROSYN     TAKE these medications   albuterol 108 (90 Base) MCG/ACT inhaler Commonly known as:  PROVENTIL HFA;VENTOLIN HFA Inhale 1-2 puffs into the lungs every 6 (six) hours as needed for wheezing or shortness of breath.   ALPRAZolam 0.25 MG tablet Commonly known as:  XANAX Take 1-2 tablets (0.25-0.5 mg total) by mouth at bedtime as needed for anxiety or sleep.   amLODipine 5 MG tablet Commonly known as:  NORVASC Take 1 tablet (5 mg total) by mouth daily.   azelastine 0.1 % nasal spray Commonly known as:  ASTELIN Place 2 sprays into both nostrils 2 (two) times daily. Use in each nostril as directed What changed:    when to take this  reasons to take this   bisoprolol-hydrochlorothiazide 5-6.25 MG tablet Commonly known as:  ZIAC Take 1 tablet by mouth daily.   busPIRone 15 MG tablet Commonly known as:  BUSPAR Take 1 tablet (15 mg total) by mouth 2 (two) times daily. What changed:    how much to take  when to take this   docusate sodium 100 MG capsule Commonly known as:  COLACE Take 1 capsule (100 mg total) by mouth every 12 (twelve) hours. What changed:    when to take this  reasons to take this   escitalopram 10 MG tablet Commonly known as:  LEXAPRO Take 2 tablets (20 mg total) by mouth daily.   fluticasone 50 MCG/ACT nasal spray Commonly known as:  FLONASE Place 1 spray into the nose daily as needed for allergies.   LUBRICANT EYE DROPS 0.4-0.3 % Soln Generic drug:  Polyethyl Glycol-Propyl Glycol Place 1 drop into both eyes 3 (three) times daily as needed (for dry/irritated eyes.).   NEXPLANON 68 MG Impl implant Generic drug:  etonogestrel 1 each by Subdermal route once.   pantoprazole 40 MG tablet Commonly known as:   PROTONIX Take 40 mg by mouth daily.   potassium chloride SA 20 MEQ tablet Commonly known as:  K-DUR,KLOR-CON Take 2 tablets (40 mEq total) by mouth 2 (two) times daily.   ranitidine 150 MG capsule Commonly known as:  ZANTAC Take 1 capsule (150 mg total) by mouth daily as needed for heartburn.   sodium chloride 0.65 % Soln nasal spray Commonly known as:  OCEAN Place 1 spray into both nostrils daily as needed for congestion.   traZODone 50 MG tablet Commonly known as:  DESYREL Take 2 tablets (100 mg total) by mouth at bedtime.       Diagnostic Studies: Dg Cervical Spine Complete  Result Date: 08/31/2018 CLINICAL DATA:  C3 through C7 fusion. EXAM: DG C-ARM 61-120 MIN; CERVICAL SPINE - COMPLETE  4+ VIEW COMPARISON:  06/06/2018. FINDINGS: C3 through C7 anterior and interbody fusion. Hardware intact. Anatomic alignment. No acute bony abnormality identified. IMPRESSION: C3 through C7 anterior interbody fusion. Hardware intact. Anatomic alignment. Electronically Signed   By: Marcello Moores  Register   On: 08/31/2018 15:30   Dg C-arm 1-60 Min  Result Date: 08/31/2018 CLINICAL DATA:  C3 through C7 fusion. EXAM: DG C-ARM 61-120 MIN; CERVICAL SPINE - COMPLETE 4+ VIEW COMPARISON:  06/06/2018. FINDINGS: C3 through C7 anterior and interbody fusion. Hardware intact. Anatomic alignment. No acute bony abnormality identified. IMPRESSION: C3 through C7 anterior interbody fusion. Hardware intact. Anatomic alignment. Electronically Signed   By: Marcello Moores  Register   On: 08/31/2018 15:30   Dg C-arm 1-60 Min  Result Date: 08/31/2018 CLINICAL DATA:  C3 through C7 fusion. EXAM: DG C-ARM 61-120 MIN; CERVICAL SPINE - COMPLETE 4+ VIEW COMPARISON:  06/06/2018. FINDINGS: C3 through C7 anterior and interbody fusion. Hardware intact. Anatomic alignment. No acute bony abnormality identified. IMPRESSION: C3 through C7 anterior interbody fusion. Hardware intact. Anatomic alignment. Electronically Signed   By: Marcello Moores  Register    On: 08/31/2018 15:30   Dg C-arm 1-60 Min  Result Date: 08/31/2018 CLINICAL DATA:  C3 through C7 fusion. EXAM: DG C-ARM 61-120 MIN; CERVICAL SPINE - COMPLETE 4+ VIEW COMPARISON:  06/06/2018. FINDINGS: C3 through C7 anterior and interbody fusion. Hardware intact. Anatomic alignment. No acute bony abnormality identified. IMPRESSION: C3 through C7 anterior interbody fusion. Hardware intact. Anatomic alignment. Electronically Signed   By: Marcello Moores  Register   On: 08/31/2018 15:30    Discharge Instructions    Incentive spirometry RT   Complete by:  As directed       Follow-up Information    Melina Schools, MD Follow up in 2 week(s).   Specialty:  Orthopedic Surgery Contact information: [address] [place] [address] [phone]           Discharge Plan:  discharge to HOME  Disposition:   Patient doing well.  No difficulty breathing, no swelling at incision site.  Cleared by PT for home discharge.  Will follow up with me in 2 weeks.    Signed: Melina Schools D for Dr. Melina Schools [facility] [phone] 09/02/2018, 7:31 AM

## 2018-09-03 ENCOUNTER — Encounter (HOSPITAL_COMMUNITY): Payer: Self-pay | Admitting: Orthopedic Surgery

## 2018-09-04 ENCOUNTER — Other Ambulatory Visit (HOSPITAL_COMMUNITY): Payer: BC Managed Care – PPO

## 2018-09-05 ENCOUNTER — Other Ambulatory Visit (HOSPITAL_COMMUNITY): Payer: BC Managed Care – PPO

## 2018-09-06 ENCOUNTER — Other Ambulatory Visit (HOSPITAL_COMMUNITY): Payer: BC Managed Care – PPO

## 2018-09-07 ENCOUNTER — Other Ambulatory Visit (HOSPITAL_COMMUNITY): Payer: BC Managed Care – PPO

## 2018-09-08 ENCOUNTER — Other Ambulatory Visit (HOSPITAL_COMMUNITY): Payer: BC Managed Care – PPO

## 2018-09-11 ENCOUNTER — Other Ambulatory Visit (HOSPITAL_COMMUNITY): Payer: BC Managed Care – PPO

## 2018-09-12 ENCOUNTER — Other Ambulatory Visit (HOSPITAL_COMMUNITY): Payer: BC Managed Care – PPO

## 2018-09-12 ENCOUNTER — Telehealth: Payer: Self-pay | Admitting: Physician Assistant

## 2018-09-12 NOTE — Telephone Encounter (Signed)
Called and spoke with pt regarding her appt with [facility] on 09/27/18. Due to provider schedule change, pt will need to reschedule. Pt stated that she is recovering from surgery and will call us back to reschedule. When pt calls back, please make an appt with McVey for follow fro anxiety  follow for anxiety at her convenience. Thank you!

## 2018-09-13 ENCOUNTER — Other Ambulatory Visit (HOSPITAL_COMMUNITY): Payer: BC Managed Care – PPO

## 2018-09-15 ENCOUNTER — Other Ambulatory Visit (HOSPITAL_COMMUNITY): Payer: BC Managed Care – PPO

## 2018-09-18 ENCOUNTER — Other Ambulatory Visit (HOSPITAL_COMMUNITY): Payer: BC Managed Care – PPO

## 2018-09-19 ENCOUNTER — Other Ambulatory Visit (HOSPITAL_COMMUNITY): Payer: BC Managed Care – PPO

## 2018-09-20 ENCOUNTER — Other Ambulatory Visit (HOSPITAL_COMMUNITY): Payer: BC Managed Care – PPO

## 2018-09-21 ENCOUNTER — Other Ambulatory Visit (HOSPITAL_COMMUNITY): Payer: BC Managed Care – PPO

## 2018-09-22 ENCOUNTER — Other Ambulatory Visit (HOSPITAL_COMMUNITY): Payer: BC Managed Care – PPO

## 2018-09-25 ENCOUNTER — Other Ambulatory Visit (HOSPITAL_COMMUNITY): Payer: BC Managed Care – PPO

## 2018-09-26 ENCOUNTER — Other Ambulatory Visit (HOSPITAL_COMMUNITY): Payer: BC Managed Care – PPO

## 2018-09-27 ENCOUNTER — Ambulatory Visit: Payer: BC Managed Care – PPO | Admitting: Physician Assistant

## 2018-09-27 ENCOUNTER — Other Ambulatory Visit (HOSPITAL_COMMUNITY): Payer: BC Managed Care – PPO

## 2018-09-28 ENCOUNTER — Other Ambulatory Visit (HOSPITAL_COMMUNITY): Payer: BC Managed Care – PPO

## 2018-09-29 ENCOUNTER — Other Ambulatory Visit (HOSPITAL_COMMUNITY): Payer: BC Managed Care – PPO

## 2018-10-02 ENCOUNTER — Other Ambulatory Visit (HOSPITAL_COMMUNITY): Payer: BC Managed Care – PPO

## 2018-10-03 ENCOUNTER — Encounter: Payer: Self-pay | Admitting: Obstetrics & Gynecology

## 2018-10-03 ENCOUNTER — Ambulatory Visit (INDEPENDENT_AMBULATORY_CARE_PROVIDER_SITE_OTHER): Payer: BC Managed Care – PPO

## 2018-10-03 ENCOUNTER — Other Ambulatory Visit (HOSPITAL_COMMUNITY): Payer: BC Managed Care – PPO

## 2018-10-03 ENCOUNTER — Ambulatory Visit: Payer: BC Managed Care – PPO | Admitting: Obstetrics & Gynecology

## 2018-10-03 ENCOUNTER — Ambulatory Visit (INDEPENDENT_AMBULATORY_CARE_PROVIDER_SITE_OTHER): Payer: BC Managed Care – PPO | Admitting: Obstetrics & Gynecology

## 2018-10-03 DIAGNOSIS — N83202 Unspecified ovarian cyst, left side: Secondary | ICD-10-CM | POA: Diagnosis not present

## 2018-10-03 DIAGNOSIS — R102 Pelvic and perineal pain: Secondary | ICD-10-CM

## 2018-10-03 NOTE — Progress Notes (Signed)
    Megan Lang 08/18/1971 [id number]        47 y.o.  [id number]   RP: Follow-up left ovarian cyst for pelvic ultrasound  HPI: No current pelvic pain.  Well on Nexplanon since October 2017.  Last pelvic ultrasound July 22, 2018 showed a simple left ovarian cyst measuring 3.1 cm.   OB History  Gravida Para Term Preterm AB Living  3 3 3     3   SAB TAB Ectopic Multiple Live Births          3    # Outcome Date GA Lbr Len/2nd Weight Sex Delivery Anes PTL Lv  3 Term     F Vag-Spont  N LIV  2 Term     F Vag-Spont  N LIV  1 Term     F Vag-Spont  N LIV    Past medical history,surgical history, problem list, medications, allergies, family history and social history were all reviewed and documented in the EPIC chart.   Directed ROS with pertinent positives and negatives documented in the history of present illness/assessment and plan.  Exam:  There were no vitals filed for this visit. General appearance:  Normal  Pelvic ultrasound today: T/V images.  Uterus anteverted measuring 8.38 x 5.58 x 4.67 cm.  Intramural fibroids measured at 1.8 x 1.7 cm, 1.8 x 1.2 cm.  Endometrial lining normal at 3.6 mm.  Right ovary normal.  Left ovary with a thin-walled echo-free follicle measuring 1.9 x 1.5 cm.  Previous cyst on the left ovary is not seen.  No free fluid in the posterior cul-de-sac.   Assessment/Plan:  47 y.o. [id number]   1. Left ovarian cyst No pelvic pain currently.  Left ovarian cyst resolved.  Normal pelvic ultrasound today with only a left ovarian follicle present.  Patient reassured.  Will follow-up for annual gynecologic exam in February 2020.  Counseling on above issues and coordination of care more than 50% for 15 minutes.  Princess Bruins MD, 3:21 PM 10/03/2018

## 2018-10-03 NOTE — Patient Instructions (Signed)
1. Left ovarian cyst No pelvic pain currently.  Left ovarian cyst resolved.  Normal pelvic ultrasound today with only a left ovarian follicle present.  Patient reassured.  Will follow-up for annual gynecologic exam in February 2020.  Megan Lang, it was a pleasure seeing you today!

## 2018-10-04 ENCOUNTER — Other Ambulatory Visit (HOSPITAL_COMMUNITY): Payer: BC Managed Care – PPO

## 2018-10-05 ENCOUNTER — Ambulatory Visit: Payer: BC Managed Care – PPO | Admitting: Family Medicine

## 2018-10-09 ENCOUNTER — Ambulatory Visit (INDEPENDENT_AMBULATORY_CARE_PROVIDER_SITE_OTHER): Payer: BC Managed Care – PPO | Admitting: Psychology

## 2018-10-09 DIAGNOSIS — F411 Generalized anxiety disorder: Secondary | ICD-10-CM | POA: Diagnosis not present

## 2018-10-16 ENCOUNTER — Ambulatory Visit: Payer: 59 | Admitting: Psychology

## 2018-10-23 ENCOUNTER — Ambulatory Visit (INDEPENDENT_AMBULATORY_CARE_PROVIDER_SITE_OTHER): Payer: BC Managed Care – PPO | Admitting: Psychology

## 2018-10-23 DIAGNOSIS — F411 Generalized anxiety disorder: Secondary | ICD-10-CM | POA: Diagnosis not present

## 2018-10-30 ENCOUNTER — Telehealth: Payer: Self-pay | Admitting: Family Medicine

## 2018-10-30 NOTE — Telephone Encounter (Signed)
Copied from [place] [phone]. Topic: Quick Communication - See Telephone Encounter >> Oct 30, 2018 12:28 PM Blase Mess A wrote: CRM for notification. See Telephone encounter for: 10/30/18.  Patient is requesting an order for blood work to check on her kidneys. Please advise

## 2018-11-02 NOTE — Telephone Encounter (Signed)
Patient stated that since starting anxiety medication her kidney level has dropped. She stated "in September her level was 88-89, now it is 55," she is concerned. Pt appointment was scheduled for Monday, 11/06/18 at 8:20 am. Pt was asked to come in fasting, in case there is blood work that requires fasting.

## 2018-11-06 ENCOUNTER — Ambulatory Visit: Payer: BC Managed Care – PPO | Admitting: Emergency Medicine

## 2018-11-06 ENCOUNTER — Other Ambulatory Visit: Payer: Self-pay

## 2018-11-06 ENCOUNTER — Encounter: Payer: Self-pay | Admitting: Emergency Medicine

## 2018-11-06 ENCOUNTER — Ambulatory Visit (INDEPENDENT_AMBULATORY_CARE_PROVIDER_SITE_OTHER): Payer: BC Managed Care – PPO | Admitting: Psychology

## 2018-11-06 VITALS — BP 152/94 | HR 88 | Temp 98.7°F | Resp 18 | Ht 62.0 in | Wt 180.6 lb

## 2018-11-06 DIAGNOSIS — I10 Essential (primary) hypertension: Secondary | ICD-10-CM | POA: Diagnosis not present

## 2018-11-06 DIAGNOSIS — Z23 Encounter for immunization: Secondary | ICD-10-CM

## 2018-11-06 DIAGNOSIS — F411 Generalized anxiety disorder: Secondary | ICD-10-CM

## 2018-11-06 NOTE — Patient Instructions (Addendum)
If you have lab work done today you will be contacted with your lab results within the next 2 weeks.  If you have not heard from [place] then please contact us. The fastest way to get your results is to register for My Chart.   IF you received an x-ray today, you will receive an invoice from [facility]. Please contact [facility] at [phone] with questions or concerns regarding your invoice.   IF you received labwork today, you will receive an invoice from [place]. Please contact [facility] at [phone] with questions or concerns regarding your invoice.   Our billing staff will not be able to assist you with questions regarding bills from these companies.  You will be contacted with the lab results as soon as they are available. The fastest way to get your results is to activate your My Chart account. Instructions are located on the last page of this paperwork. If you have not heard from [place] regarding the results in 2 weeks, please contact this office.     Hypertension Hypertension, commonly called high blood pressure, is when the force of blood pumping through the arteries is too strong. The arteries are the blood vessels that carry blood from the heart throughout the body. Hypertension forces the heart to work harder to pump blood and may cause arteries to become narrow or stiff. Having untreated or uncontrolled hypertension can cause heart attacks, strokes, kidney disease, and other problems. A blood pressure reading consists of a higher number over a lower number. Ideally, your blood pressure should be below 120/80. The first ("top") number is called the systolic pressure. It is a measure of the pressure in your arteries as your heart beats. The second ("bottom") number is called the diastolic pressure. It is a measure of the pressure in your arteries as the heart relaxes. What are the causes? The cause of this condition is not known. What increases the risk? Some  risk factors for high blood pressure are under your control. Others are not. Factors you can change  Smoking.  Having type 2 diabetes mellitus, high cholesterol, or both.  Not getting enough exercise or physical activity.  Being overweight.  Having too much fat, sugar, calories, or salt (sodium) in your diet.  Drinking too much alcohol. Factors that are difficult or impossible to change  Having chronic kidney disease.  Having a family history of high blood pressure.  Age. Risk increases with age.  Race. You may be at higher risk if you are African-American.  Gender. Men are at higher risk than women before age 2. After age 73, women are at higher risk than men.  Having obstructive sleep apnea.  Stress. What are the signs or symptoms? Extremely high blood pressure (hypertensive crisis) may cause:  Headache.  Anxiety.  Shortness of breath.  Nosebleed.  Nausea and vomiting.  Severe chest pain.  Jerky movements you cannot control (seizures). How is this diagnosed? This condition is diagnosed by measuring your blood pressure while you are seated, with your arm resting on a surface. The cuff of the blood pressure monitor will be placed directly against the skin of your upper arm at the level of your heart. It should be measured at least twice using the same arm. Certain conditions can cause a difference in blood pressure between your right and left arms. Certain factors can cause blood pressure readings to be lower or higher than normal (elevated) for a short period of time:  When your  blood pressure is higher when you are in a health care provider's office than when you are at home, this is called white coat hypertension. Most people with this condition do not need medicines.  When your blood pressure is higher at home than when you are in a health care provider's office, this is called masked hypertension. Most people with this condition may need medicines to control  blood pressure. If you have a high blood pressure reading during one visit or you have normal blood pressure with other risk factors:  You may be asked to return on a different day to have your blood pressure checked again.  You may be asked to monitor your blood pressure at home for 1 week or longer. If you are diagnosed with hypertension, you may have other blood or imaging tests to help your health care provider understand your overall risk for other conditions. How is this treated? This condition is treated by making healthy lifestyle changes, such as eating healthy foods, exercising more, and reducing your alcohol intake. Your health care provider may prescribe medicine if lifestyle changes are not enough to get your blood pressure under control, and if:  Your systolic blood pressure is above 130.  Your diastolic blood pressure is above 80. Your personal target blood pressure may vary depending on your medical conditions, your age, and other factors. Follow these instructions at home: Eating and drinking   Eat a diet that is high in fiber and potassium, and low in sodium, added sugar, and fat. An example eating plan is called the DASH (Dietary Approaches to Stop Hypertension) diet. To eat this way: ? Eat plenty of fresh fruits and vegetables. Try to fill half of your plate at each meal with fruits and vegetables. ? Eat whole grains, such as whole wheat pasta, brown rice, or whole grain bread. Fill about one quarter of your plate with whole grains. ? Eat or drink low-fat dairy products, such as skim milk or low-fat yogurt. ? Avoid fatty cuts of meat, processed or cured meats, and poultry with skin. Fill about one quarter of your plate with lean proteins, such as fish, chicken without skin, beans, eggs, and tofu. ? Avoid premade and processed foods. These tend to be higher in sodium, added sugar, and fat.  Reduce your daily sodium intake. Most people with hypertension should eat less than  1,500 mg of sodium a day.  Limit alcohol intake to no more than 1 drink a day for nonpregnant women and 2 drinks a day for men. One drink equals 12 oz of beer, 5 oz of wine, or 1 oz of hard liquor. Lifestyle   Work with your health care provider to maintain a healthy body weight or to lose weight. Ask what an ideal weight is for you.  Get at least 30 minutes of exercise that causes your heart to beat faster (aerobic exercise) most days of the week. Activities may include walking, swimming, or biking.  Include exercise to strengthen your muscles (resistance exercise), such as pilates or lifting weights, as part of your weekly exercise routine. Try to do these types of exercises for 30 minutes at least 3 days a week.  Do not use any products that contain nicotine or tobacco, such as cigarettes and e-cigarettes. If you need help quitting, ask your health care provider.  Monitor your blood pressure at home as told by your health care provider.  Keep all follow-up visits as told by your health care provider. This is  important. Medicines  Take over-the-counter and prescription medicines only as told by your health care provider. Follow directions carefully. Blood pressure medicines must be taken as prescribed.  Do not skip doses of blood pressure medicine. Doing this puts you at risk for problems and can make the medicine less effective.  Ask your health care provider about side effects or reactions to medicines that you should watch for. Contact a health care provider if:  You think you are having a reaction to a medicine you are taking.  You have headaches that keep coming back (recurring).  You feel dizzy.  You have swelling in your ankles.  You have trouble with your vision. Get help right away if:  You develop a severe headache or confusion.  You have unusual weakness or numbness.  You feel faint.  You have severe pain in your chest or abdomen.  You vomit  repeatedly.  You have trouble breathing. Summary  Hypertension is when the force of blood pumping through your arteries is too strong. If this condition is not controlled, it may put you at risk for serious complications.  Your personal target blood pressure may vary depending on your medical conditions, your age, and other factors. For most people, a normal blood pressure is less than 120/80.  Hypertension is treated with lifestyle changes, medicines, or a combination of both. Lifestyle changes include weight loss, eating a healthy, low-sodium diet, exercising more, and limiting alcohol. This information is not intended to replace advice given to you by your health care provider. Make sure you discuss any questions you have with your health care provider. Document Released: 10/04/2005 Document Revised: 09/01/2016 Document Reviewed: 09/01/2016 Elsevier Interactive Patient Education  2019 [place] Maintenance, Female Adopting a healthy lifestyle and getting preventive care can go a long way to promote health and wellness. Talk with your health care provider about what schedule of regular examinations is right for you. This is a good chance for you to check in with your provider about disease prevention and staying healthy. In between checkups, there are plenty of things you can do on your own. Experts have done a lot of research about which lifestyle changes and preventive measures are most likely to keep you healthy. Ask your health care provider for more information. Weight and diet Eat a healthy diet  Be sure to include plenty of vegetables, fruits, low-fat dairy products, and lean protein.  Do not eat a lot of foods high in solid fats, added sugars, or salt.  Get regular exercise. This is one of the most important things you can do for your health. ? Most adults should exercise for at least 150 minutes each week. The exercise should increase your heart rate and make you sweat  (moderate-intensity exercise). ? Most adults should also do strengthening exercises at least twice a week. This is in addition to the moderate-intensity exercise. Maintain a healthy weight  Body mass index (BMI) is a measurement that can be used to identify possible weight problems. It estimates body fat based on height and weight. Your health care provider can help determine your BMI and help you achieve or maintain a healthy weight.  For females 54 years of age and older: ? A BMI below 18.5 is considered underweight. ? A BMI of 18.5 to 24.9 is normal. ? A BMI of 25 to 29.9 is considered overweight. ? A BMI of 30 and above is considered obese. Watch levels of cholesterol and blood lipids  You should  start having your blood tested for lipids and cholesterol at 48 years of age, then have this test every 5 years.  You may need to have your cholesterol levels checked more often if: ? Your lipid or cholesterol levels are high. ? You are older than 48 years of age. ? You are at high risk for heart disease. Cancer screening Lung Cancer  Lung cancer screening is recommended for adults 80-6 years old who are at high risk for lung cancer because of a history of smoking.  A yearly low-dose CT scan of the lungs is recommended for people who: ? Currently smoke. ? Have quit within the past 15 years. ? Have at least a 30-pack-year history of smoking. A pack year is smoking an average of one pack of cigarettes a day for 1 year.  Yearly screening should continue until it has been 15 years since you quit.  Yearly screening should stop if you develop a health problem that would prevent you from having lung cancer treatment. Breast Cancer  Practice breast self-awareness. This means understanding how your breasts normally appear and feel.  It also means doing regular breast self-exams. Let your health care provider know about any changes, no matter how small.  If you are in your 20s or 30s, you  should have a clinical breast exam (CBE) by a health care provider every 1-3 years as part of a regular health exam.  If you are 8 or older, have a CBE every year. Also consider having a breast X-ray (mammogram) every year.  If you have a family history of breast cancer, talk to your health care provider about genetic screening.  If you are at high risk for breast cancer, talk to your health care provider about having an MRI and a mammogram every year.  Breast cancer gene (BRCA) assessment is recommended for women who have family members with BRCA-related cancers. BRCA-related cancers include: ? Breast. ? Ovarian. ? Tubal. ? Peritoneal cancers.  Results of the assessment will determine the need for genetic counseling and BRCA1 and BRCA2 testing. Cervical Cancer Your health care provider may recommend that you be screened regularly for cancer of the pelvic organs (ovaries, uterus, and vagina). This screening involves a pelvic examination, including checking for microscopic changes to the surface of your cervix (Pap test). You may be encouraged to have this screening done every 3 years, beginning at age 80.  For women ages 57-65, health care providers may recommend pelvic exams and Pap testing every 3 years, or they may recommend the Pap and pelvic exam, combined with testing for human papilloma virus (HPV), every 5 years. Some types of HPV increase your risk of cervical cancer. Testing for HPV may also be done on women of any age with unclear Pap test results.  Other health care providers may not recommend any screening for nonpregnant women who are considered low risk for pelvic cancer and who do not have symptoms. Ask your health care provider if a screening pelvic exam is right for you.  If you have had past treatment for cervical cancer or a condition that could lead to cancer, you need Pap tests and screening for cancer for at least 20 years after your treatment. If Pap tests have been  discontinued, your risk factors (such as having a new sexual partner) need to be reassessed to determine if screening should resume. Some women have medical problems that increase the chance of getting cervical cancer. In these cases, your health care provider  may recommend more frequent screening and Pap tests. Colorectal Cancer  This type of cancer can be detected and often prevented.  Routine colorectal cancer screening usually begins at 48 years of age and continues through 48 years of age.  Your health care provider may recommend screening at an earlier age if you have risk factors for colon cancer.  Your health care provider may also recommend using home test kits to check for hidden blood in the stool.  A small camera at the end of a tube can be used to examine your colon directly (sigmoidoscopy or colonoscopy). This is done to check for the earliest forms of colorectal cancer.  Routine screening usually begins at age 58.  Direct examination of the colon should be repeated every 5-10 years through 48 years of age. However, you may need to be screened more often if early forms of precancerous polyps or small growths are found. Skin Cancer  Check your skin from head to toe regularly.  Tell your health care provider about any new moles or changes in moles, especially if there is a change in a mole's shape or color.  Also tell your health care provider if you have a mole that is larger than the size of a pencil eraser.  Always use sunscreen. Apply sunscreen liberally and repeatedly throughout the day.  Protect yourself by wearing long sleeves, pants, a wide-brimmed hat, and sunglasses whenever you are outside. Heart disease, diabetes, and high blood pressure  High blood pressure causes heart disease and increases the risk of stroke. High blood pressure is more likely to develop in: ? People who have blood pressure in the high end of the normal range (130-139/85-89 mm Hg). ? People  who are overweight or obese. ? People who are African American.  If you are 50-60 years of age, have your blood pressure checked every 3-5 years. If you are 48 years of age or older, have your blood pressure checked every year. You should have your blood pressure measured twice-once when you are at a hospital or clinic, and once when you are not at a hospital or clinic. Record the average of the two measurements. To check your blood pressure when you are not at a hospital or clinic, you can use: ? An automated blood pressure machine at a pharmacy. ? A home blood pressure monitor.  If you are between 24 years and 47 years old, ask your health care provider if you should take aspirin to prevent strokes.  Have regular diabetes screenings. This involves taking a blood sample to check your fasting blood sugar level. ? If you are at a normal weight and have a low risk for diabetes, have this test once every three years after 49 years of age. ? If you are overweight and have a high risk for diabetes, consider being tested at a younger age or more often. Preventing infection Hepatitis B  If you have a higher risk for hepatitis B, you should be screened for this virus. You are considered at high risk for hepatitis B if: ? You were born in a country where hepatitis B is common. Ask your health care provider which countries are considered high risk. ? Your parents were born in a high-risk country, and you have not been immunized against hepatitis B (hepatitis B vaccine). ? You have HIV or AIDS. ? You use needles to inject street drugs. ? You live with someone who has hepatitis B. ? You have had sex with someone  who has hepatitis B. ? You get hemodialysis treatment. ? You take certain medicines for conditions, including cancer, organ transplantation, and autoimmune conditions. Hepatitis C  Blood testing is recommended for: ? Everyone born from [birth year] through 1965. ? Anyone with known risk factors for  hepatitis C. Sexually transmitted infections (STIs)  You should be screened for sexually transmitted infections (STIs) including gonorrhea and chlamydia if: ? You are sexually active and are younger than 48 years of age. ? You are older than 48 years of age and your health care provider tells you that you are at risk for this type of infection. ? Your sexual activity has changed since you were last screened and you are at an increased risk for chlamydia or gonorrhea. Ask your health care provider if you are at risk.  If you do not have HIV, but are at risk, it may be recommended that you take a prescription medicine daily to prevent HIV infection. This is called pre-exposure prophylaxis (PrEP). You are considered at risk if: ? You are sexually active and do not regularly use condoms or know the HIV status of your partner(s). ? You take drugs by injection. ? You are sexually active with a partner who has HIV. Talk with your health care provider about whether you are at high risk of being infected with HIV. If you choose to begin PrEP, you should first be tested for HIV. You should then be tested every 3 months for as long as you are taking PrEP. Pregnancy  If you are premenopausal and you may become pregnant, ask your health care provider about preconception counseling.  If you may become pregnant, take 400 to 800 micrograms (mcg) of folic acid every day.  If you want to prevent pregnancy, talk to your health care provider about birth control (contraception). Osteoporosis and menopause  Osteoporosis is a disease in which the bones lose minerals and strength with aging. This can result in serious bone fractures. Your risk for osteoporosis can be identified using a bone density scan.  If you are 61 years of age or older, or if you are at risk for osteoporosis and fractures, ask your health care provider if you should be screened.  Ask your health care provider whether you should take a calcium  or vitamin D supplement to lower your risk for osteoporosis.  Menopause may have certain physical symptoms and risks.  Hormone replacement therapy may reduce some of these symptoms and risks. Talk to your health care provider about whether hormone replacement therapy is right for you. Follow these instructions at home:  Schedule regular health, dental, and eye exams.  Stay current with your immunizations.  Do not use any tobacco products including cigarettes, chewing tobacco, or electronic cigarettes.  If you are pregnant, do not drink alcohol.  If you are breastfeeding, limit how much and how often you drink alcohol.  Limit alcohol intake to no more than 1 drink per day for nonpregnant women. One drink equals 12 ounces of beer, 5 ounces of wine, or 1 ounces of hard liquor.  Do not use street drugs.  Do not share needles.  Ask your health care provider for help if you need support or information about quitting drugs.  Tell your health care provider if you often feel depressed.  Tell your health care provider if you have ever been abused or do not feel safe at home. This information is not intended to replace advice given to you by your health care provider.  Make sure you discuss any questions you have with your health care provider. Document Released: 04/19/2011 Document Revised: 03/11/2016 Document Reviewed: 07/08/2015 Elsevier Interactive Patient Education  2019 [employer].

## 2018-11-06 NOTE — Progress Notes (Signed)
Megan Lang 48 y.o.   Chief Complaint  Patient presents with  . bloodwork    here to recheck kidney levels     HISTORY OF PRESENT ILLNESS: This is a 48 y.o. female here to recheck her kidney function tests.  Asymptomatic.Status post recent cervical spine surgery.  Ref Range & Units 83moago  Creatinine, Ser 0.44 - 1.00 mg/dL 0.77   GFR calc non Af Amer >60 mL/min >60   GFR calc Af Amer >60 mL/min >60       HPI   Prior to Admission medications   Medication Sig Start Date End Date Taking? Authorizing Provider  albuterol (PROVENTIL HFA;VENTOLIN HFA) 108 (90 Base) MCG/ACT inhaler Inhale 1-2 puffs into the lungs every 6 (six) hours as needed for wheezing or shortness of breath. 09/03/16  Yes ZElnora Morrison MD  amLODipine (NORVASC) 5 MG tablet Take 1 tablet (5 mg total) by mouth daily. 07/27/18  Yes Alistair Senft, MInes Bloomer MD  busPIRone (BUSPAR) 15 MG tablet Take 1 tablet (15 mg total) by mouth 2 (two) times daily. Patient taking differently: Take 30 mg by mouth daily.  08/09/18  Yes LDerrill Center NP  escitalopram (LEXAPRO) 10 MG tablet Take 2 tablets (20 mg total) by mouth daily. 08/17/18  Yes McVey, EGelene Mink PA-C  etonogestrel (NEXPLANON) 68 MG IMPL implant 1 each by Subdermal route once.   Yes [provider]  fluticasone (FLONASE) 50 MCG/ACT nasal spray Place 1 spray into the nose daily as needed for allergies.  02/27/18 02/27/19 Yes [provider]  pantoprazole (PROTONIX) 40 MG tablet Take 40 mg by mouth daily.  07/31/18  Yes [provider]  Polyethyl Glycol-Propyl Glycol (LUBRICANT EYE DROPS) 0.4-0.3 % SOLN Place 1 drop into both eyes 3 (three) times daily as needed (for dry/irritated eyes.).   Yes [provider]  potassium chloride SA (K-DUR,KLOR-CON) 20 MEQ tablet Take 2 tablets (40 mEq total) by mouth 2 (two) times daily. 07/20/18  Yes Mesner, JCorene Cornea MD  sodium chloride (OCEAN) 0.65 % SOLN nasal spray Place 1 spray into both  nostrils daily as needed for congestion.    Yes [provider]  traZODone (DESYREL) 50 MG tablet Take 2 tablets (100 mg total) by mouth at bedtime. 08/22/18  Yes LDerrill Center NP  ALPRAZolam (Duanne Moron 0.25 MG tablet Take 1-2 tablets (0.25-0.5 mg total) by mouth at bedtime as needed for anxiety or sleep. Patient not taking: Reported on 10/03/2018 08/08/18   McVey, EGelene Mink PA-C  azelastine (ASTELIN) 0.1 % nasal spray Place 2 sprays into both nostrils 2 (two) times daily. Use in each nostril as directed Patient not taking: Reported on 11/06/2018 11/16/17   JHarrison Mons PA  bisoprolol-hydrochlorothiazide (ZIAC) 5-6.25 MG tablet Take 1 tablet by mouth daily. Patient not taking: Reported on 11/06/2018 07/27/18   SHorald Pollen MD    Allergies  Allergen Reactions  . Moxifloxacin Swelling    other  . Clindamycin Diarrhea and Other (See Comments)    GI upset  . Codeine Other (See Comments)    TACHYCARDIA   . Levofloxacin Other (See Comments)    other  . Metronidazole Diarrhea  . Minocin [Minocycline Hcl] Other (See Comments)    Stomach Pains  . Penicillins Rash    Has patient had a PCN reaction causing immediate rash, facial/tongue/throat swelling, SOB or lightheadedness with hypotension:yes Has patient had a PCN reaction causing severe rash involving mucus membranes or skin necrosis:no Has patient had a PCN reaction that  required hospitalization:no Has patient had a PCN reaction occurring within the last 10 years:No If all of the above answers are "NO", then may proceed with Cephalosporin    Patient Active Problem List   Diagnosis Date Noted  . Cervical myelopathy ([place]) 08/31/2018  . Spinal stenosis in cervical region 08/29/2018  . Cervicalgia 08/29/2018  . Shoulder impingement syndrome, left 12/19/2017  . Scoliosis (and kyphoscoliosis), idiopathic 07/01/2017  . Chronic bilateral back pain 07/01/2017  . Nexplanon in place 07/29/2016  . Cardiomegaly  07/01/2016  . History of aortic aneurysm 07/01/2016  . GERD (gastroesophageal reflux disease) 01/23/2016  . Dyspnea 10/27/2015  . Costochondritis 08/26/2015  . Obesity 07/01/2015  . Upper airway cough syndrome 06/30/2015  . History of shingles 01/26/2015  . Family history of colon cancer 10/27/2011  . Essential hypertension 10/27/2011    Past Medical History:  Diagnosis Date  . Acid reflux   . Allergy   . Anxiety   . Aortic aneurysm ([place])   . Arthritis   . Asthma   . Cardiomegaly   . Carpal tunnel syndrome   . Depression   . Gastric polyp   . GERD (gastroesophageal reflux disease) 01/23/2016  . Hiatal hernia   . Hypertension   . Normal spontaneous vaginal delivery    3    Past Surgical History:  Procedure Laterality Date  . ANTERIOR CERVICAL DECOMPRESSION/DISCECTOMY FUSION 4 LEVELS N/A 08/31/2018   Procedure: ANTERIOR CERVICAL DECOMPRESSION/DISCECTOMY FUSION C3-7;  Surgeon: Melina Schools, MD;  Location: [facility];  Service: Orthopedics;  Laterality: N/A;  6 hrs  . CARPAL TUNNEL RELEASE  2009  . CARPAL TUNNEL RELEASE Left   . COLONOSCOPY    . NASAL SINUS SURGERY     DEC 15,2016  . ROTATOR CUFF REPAIR Left 04/14/2018    Social History   Socioeconomic History  . Marital status: Married    Spouse name: Not on file  . Number of children: 3  . Years of education: Not on file  . Highest education level: Not on file  Occupational History  . Occupation: CUSTODIAL    Employer: [employer]  Social Needs  . Financial resource strain: Not on file  . Food insecurity:    Worry: Not on file    Inability: Not on file  . Transportation needs:    Medical: Not on file    Non-medical: Not on file  Tobacco Use  . Smoking status: Never Smoker  . Smokeless tobacco: Never Used  Substance and Sexual Activity  . Alcohol use: No    Alcohol/week: 0.0 standard drinks  . Drug use: No  . Sexual activity: Yes    Partners: Male    Birth control/protection: Injection     Comment: 1ST INTERCOURSE- 74, PARTNERS- LESS THAN 5   Lifestyle  . Physical activity:    Days per week: Not on file    Minutes per session: Not on file  . Stress: Not on file  Relationships  . Social connections:    Talks on phone: Not on file    Gets together: Not on file    Attends religious service: Not on file    Active member of club or organization: Not on file    Attends meetings of clubs or organizations: Not on file    Relationship status: Not on file  . Intimate partner violence:    Fear of current or ex partner: Not on file    Emotionally abused: Not on file    Physically abused:  Not on file    Forced sexual activity: Not on file  Other Topics Concern  . Not on file  Social History Narrative   Epworth Sleepiness Scale = 8 (as of 10/28/2015)    Family History  Problem Relation Age of Onset  . Hypertension Mother   . Kidney disease Father   . Liver disease Father        Unknown diagnosis  . Breast cancer Maternal Grandmother   . Hypertension Sister   . Hypertension Brother   . Drug abuse Brother      Review of Systems  Constitutional: Negative.  Negative for chills and fever.  HENT: Negative.   Eyes: Negative.  Negative for blurred vision and double vision.  Respiratory: Negative.  Negative for cough and shortness of breath.   Cardiovascular: Negative.  Negative for chest pain and palpitations.  Gastrointestinal: Negative.  Negative for abdominal pain, nausea and vomiting.  Skin: Negative.  Negative for rash.  Neurological: Negative.  Negative for dizziness and headaches.  Endo/Heme/Allergies: Negative.   All other systems reviewed and are negative.   Vitals:   11/06/18 0818 11/06/18 0825  BP: (!) 149/91 (!) 152/94  Pulse: 88   Resp: 18   Temp: 98.7 F (37.1 C)   SpO2: 96%     Physical Exam Constitutional:      Appearance: Normal appearance.  HENT:     Head: Normocephalic and atraumatic.     Mouth/Throat:     Mouth: Mucous membranes are  moist.     Pharynx: Oropharynx is clear.  Eyes:     Extraocular Movements: Extraocular movements intact.     Pupils: Pupils are equal, round, and reactive to light.  Neck:     Musculoskeletal: Normal range of motion.  Cardiovascular:     Rate and Rhythm: Normal rate and regular rhythm.     Heart sounds: Normal heart sounds.  Pulmonary:     Effort: Pulmonary effort is normal.     Breath sounds: Normal breath sounds.  Musculoskeletal: Normal range of motion.  Skin:    General: Skin is warm and dry.  Neurological:     General: No focal deficit present.     Mental Status: She is alert and oriented to person, place, and time.  Psychiatric:        Mood and Affect: Mood normal.        Behavior: Behavior normal.    A total of 25 minutes was spent in the room with the patient, greater than 50% of which was in counseling/coordination of care regarding chronic medical problems, medications, review of blood results and need for follow-up.   ASSESSMENT & PLAN: Megan Lang was seen today for bloodwork.  Diagnoses and all orders for this visit:  Essential hypertension -     Comprehensive metabolic panel -     CBC with Differential/Platelet  Need for Tdap vaccination -     Tdap vaccine greater than or equal to 7yo IM   Patient Instructions       If you have lab work done today you will be contacted with your lab results within the next 2 weeks.  If you have not heard from [place] then please contact us. The fastest way to get your results is to register for My Chart.   IF you received an x-ray today, you will receive an invoice from [facility]. Please contact [facility] at [phone] with questions or concerns regarding your invoice.   IF you received labwork today,  you will receive an invoice from [facility]. Please contact [facility] at [phone] with questions or concerns regarding your invoice.   Our billing staff will not be able to assist you with questions  regarding bills from these companies.  You will be contacted with the lab results as soon as they are available. The fastest way to get your results is to activate your My Chart account. Instructions are located on the last page of this paperwork. If you have not heard from [place] regarding the results in 2 weeks, please contact this office.    Health Maintenance, Female Adopting a healthy lifestyle and getting preventive care can go a long way to promote health and wellness. Talk with your health care provider about what schedule of regular examinations is right for you. This is a good chance for you to check in with your provider about disease prevention and staying healthy. In between checkups, there are plenty of things you can do on your own. Experts have done a lot of research about which lifestyle changes and preventive measures are most likely to keep you healthy. Ask your health care provider for more information. Weight and diet Eat a healthy diet  Be sure to include plenty of vegetables, fruits, low-fat dairy products, and lean protein.  Do not eat a lot of foods high in solid fats, added sugars, or salt.  Get regular exercise. This is one of the most important things you can do for your health. ? Most adults should exercise for at least 150 minutes each week. The exercise should increase your heart rate and make you sweat (moderate-intensity exercise). ? Most adults should also do strengthening exercises at least twice a week. This is in addition to the moderate-intensity exercise. Maintain a healthy weight  Body mass index (BMI) is a measurement that can be used to identify possible weight problems. It estimates body fat based on height and weight. Your health care provider can help determine your BMI and help you achieve or maintain a healthy weight.  For females 32 years of age and older: ? A BMI below 18.5 is considered underweight. ? A BMI of 18.5 to 24.9 is normal. ? A BMI of  25 to 29.9 is considered overweight. ? A BMI of 30 and above is considered obese. Watch levels of cholesterol and blood lipids  You should start having your blood tested for lipids and cholesterol at 48 years of age, then have this test every 5 years.  You may need to have your cholesterol levels checked more often if: ? Your lipid or cholesterol levels are high. ? You are older than 48 years of age. ? You are at high risk for heart disease. Cancer screening Lung Cancer  Lung cancer screening is recommended for adults 35-44 years old who are at high risk for lung cancer because of a history of smoking.  A yearly low-dose CT scan of the lungs is recommended for people who: ? Currently smoke. ? Have quit within the past 15 years. ? Have at least a 30-pack-year history of smoking. A pack year is smoking an average of one pack of cigarettes a day for 1 year.  Yearly screening should continue until it has been 15 years since you quit.  Yearly screening should stop if you develop a health problem that would prevent you from having lung cancer treatment. Breast Cancer  Practice breast self-awareness. This means understanding how your breasts normally appear and feel.  It also means doing regular  breast self-exams. Let your health care provider know about any changes, no matter how small.  If you are in your 20s or 30s, you should have a clinical breast exam (CBE) by a health care provider every 1-3 years as part of a regular health exam.  If you are 10 or older, have a CBE every year. Also consider having a breast X-ray (mammogram) every year.  If you have a family history of breast cancer, talk to your health care provider about genetic screening.  If you are at high risk for breast cancer, talk to your health care provider about having an MRI and a mammogram every year.  Breast cancer gene (BRCA) assessment is recommended for women who have family members with BRCA-related cancers.  BRCA-related cancers include: ? Breast. ? Ovarian. ? Tubal. ? Peritoneal cancers.  Results of the assessment will determine the need for genetic counseling and BRCA1 and BRCA2 testing. Cervical Cancer Your health care provider may recommend that you be screened regularly for cancer of the pelvic organs (ovaries, uterus, and vagina). This screening involves a pelvic examination, including checking for microscopic changes to the surface of your cervix (Pap test). You may be encouraged to have this screening done every 3 years, beginning at age 46.  For women ages 80-65, health care providers may recommend pelvic exams and Pap testing every 3 years, or they may recommend the Pap and pelvic exam, combined with testing for human papilloma virus (HPV), every 5 years. Some types of HPV increase your risk of cervical cancer. Testing for HPV may also be done on women of any age with unclear Pap test results.  Other health care providers may not recommend any screening for nonpregnant women who are considered low risk for pelvic cancer and who do not have symptoms. Ask your health care provider if a screening pelvic exam is right for you.  If you have had past treatment for cervical cancer or a condition that could lead to cancer, you need Pap tests and screening for cancer for at least 20 years after your treatment. If Pap tests have been discontinued, your risk factors (such as having a new sexual partner) need to be reassessed to determine if screening should resume. Some women have medical problems that increase the chance of getting cervical cancer. In these cases, your health care provider may recommend more frequent screening and Pap tests. Colorectal Cancer  This type of cancer can be detected and often prevented.  Routine colorectal cancer screening usually begins at 48 years of age and continues through 48 years of age.  Your health care provider may recommend screening at an earlier age if you  have risk factors for colon cancer.  Your health care provider may also recommend using home test kits to check for hidden blood in the stool.  A small camera at the end of a tube can be used to examine your colon directly (sigmoidoscopy or colonoscopy). This is done to check for the earliest forms of colorectal cancer.  Routine screening usually begins at age 54.  Direct examination of the colon should be repeated every 5-10 years through 48 years of age. However, you may need to be screened more often if early forms of precancerous polyps or small growths are found. Skin Cancer  Check your skin from head to toe regularly.  Tell your health care provider about any new moles or changes in moles, especially if there is a change in a mole's shape or color.  Also tell your health care provider if you have a mole that is larger than the size of a pencil eraser.  Always use sunscreen. Apply sunscreen liberally and repeatedly throughout the day.  Protect yourself by wearing long sleeves, pants, a wide-brimmed hat, and sunglasses whenever you are outside. Heart disease, diabetes, and high blood pressure  High blood pressure causes heart disease and increases the risk of stroke. High blood pressure is more likely to develop in: ? People who have blood pressure in the high end of the normal range (130-139/85-89 mm Hg). ? People who are overweight or obese. ? People who are African American.  If you are 46-4 years of age, have your blood pressure checked every 3-5 years. If you are 18 years of age or older, have your blood pressure checked every year. You should have your blood pressure measured twice-once when you are at a hospital or clinic, and once when you are not at a hospital or clinic. Record the average of the two measurements. To check your blood pressure when you are not at a hospital or clinic, you can use: ? An automated blood pressure machine at a pharmacy. ? A home blood pressure  monitor.  If you are between 56 years and 59 years old, ask your health care provider if you should take aspirin to prevent strokes.  Have regular diabetes screenings. This involves taking a blood sample to check your fasting blood sugar level. ? If you are at a normal weight and have a low risk for diabetes, have this test once every three years after 48 years of age. ? If you are overweight and have a high risk for diabetes, consider being tested at a younger age or more often. Preventing infection Hepatitis B  If you have a higher risk for hepatitis B, you should be screened for this virus. You are considered at high risk for hepatitis B if: ? You were born in a country where hepatitis B is common. Ask your health care provider which countries are considered high risk. ? Your parents were born in a high-risk country, and you have not been immunized against hepatitis B (hepatitis B vaccine). ? You have HIV or AIDS. ? You use needles to inject street drugs. ? You live with someone who has hepatitis B. ? You have had sex with someone who has hepatitis B. ? You get hemodialysis treatment. ? You take certain medicines for conditions, including cancer, organ transplantation, and autoimmune conditions. Hepatitis C  Blood testing is recommended for: ? Everyone born from [birth year] through 1965. ? Anyone with known risk factors for hepatitis C. Sexually transmitted infections (STIs)  You should be screened for sexually transmitted infections (STIs) including gonorrhea and chlamydia if: ? You are sexually active and are younger than 48 years of age. ? You are older than 48 years of age and your health care provider tells you that you are at risk for this type of infection. ? Your sexual activity has changed since you were last screened and you are at an increased risk for chlamydia or gonorrhea. Ask your health care provider if you are at risk.  If you do not have HIV, but are at risk, it may be  recommended that you take a prescription medicine daily to prevent HIV infection. This is called pre-exposure prophylaxis (PrEP). You are considered at risk if: ? You are sexually active and do not regularly use condoms or know the HIV status of your partner(s). ?  You take drugs by injection. ? You are sexually active with a partner who has HIV. Talk with your health care provider about whether you are at high risk of being infected with HIV. If you choose to begin PrEP, you should first be tested for HIV. You should then be tested every 3 months for as long as you are taking PrEP. Pregnancy  If you are premenopausal and you may become pregnant, ask your health care provider about preconception counseling.  If you may become pregnant, take 400 to 800 micrograms (mcg) of folic acid every day.  If you want to prevent pregnancy, talk to your health care provider about birth control (contraception). Osteoporosis and menopause  Osteoporosis is a disease in which the bones lose minerals and strength with aging. This can result in serious bone fractures. Your risk for osteoporosis can be identified using a bone density scan.  If you are 62 years of age or older, or if you are at risk for osteoporosis and fractures, ask your health care provider if you should be screened.  Ask your health care provider whether you should take a calcium or vitamin D supplement to lower your risk for osteoporosis.  Menopause may have certain physical symptoms and risks.  Hormone replacement therapy may reduce some of these symptoms and risks. Talk to your health care provider about whether hormone replacement therapy is right for you. Follow these instructions at home:  Schedule regular health, dental, and eye exams.  Stay current with your immunizations.  Do not use any tobacco products including cigarettes, chewing tobacco, or electronic cigarettes.  If you are pregnant, do not drink alcohol.  If you are  breastfeeding, limit how much and how often you drink alcohol.  Limit alcohol intake to no more than 1 drink per day for nonpregnant women. One drink equals 12 ounces of beer, 5 ounces of wine, or 1 ounces of hard liquor.  Do not use street drugs.  Do not share needles.  Ask your health care provider for help if you need support or information about quitting drugs.  Tell your health care provider if you often feel depressed.  Tell your health care provider if you have ever been abused or do not feel safe at home. This information is not intended to replace advice given to you by your health care provider. Make sure you discuss any questions you have with your health care provider. Document Released: 04/19/2011 Document Revised: 03/11/2016 Document Reviewed: 07/08/2015 Elsevier Interactive Patient Education  2019 [employer]      Agustina Caroli, MD [facility]

## 2018-11-07 LAB — CBC WITH DIFFERENTIAL/PLATELET
BASOS ABS: 0 10*3/uL (ref 0.0–0.2)
Basos: 0 %
EOS (ABSOLUTE): 0.1 10*3/uL (ref 0.0–0.4)
Eos: 2 %
HEMOGLOBIN: 13.5 g/dL (ref 11.1–15.9)
Hematocrit: 41 % (ref 34.0–46.6)
IMMATURE GRANS (ABS): 0 10*3/uL (ref 0.0–0.1)
IMMATURE GRANULOCYTES: 0 %
LYMPHS: 37 %
Lymphocytes Absolute: 1.2 10*3/uL (ref 0.7–3.1)
MCH: 29.7 pg (ref 26.6–33.0)
MCHC: 32.9 g/dL (ref 31.5–35.7)
MCV: 90 fL (ref 79–97)
MONOCYTES: 11 %
Monocytes Absolute: 0.4 10*3/uL (ref 0.1–0.9)
NEUTROS PCT: 50 %
Neutrophils Absolute: 1.6 10*3/uL (ref 1.4–7.0)
PLATELETS: 285 10*3/uL (ref 150–450)
RBC: 4.54 x10E6/uL (ref 3.77–5.28)
RDW: 11.7 % (ref 11.7–15.4)
WBC: 3.3 10*3/uL — AB (ref 3.4–10.8)

## 2018-11-07 LAB — COMPREHENSIVE METABOLIC PANEL
ALK PHOS: 70 IU/L (ref 39–117)
ALT: 16 IU/L (ref 0–32)
AST: 17 IU/L (ref 0–40)
Albumin/Globulin Ratio: 1.7 (ref 1.2–2.2)
Albumin: 4.6 g/dL (ref 3.8–4.8)
BILIRUBIN TOTAL: 0.5 mg/dL (ref 0.0–1.2)
BUN/Creatinine Ratio: 15 (ref 9–23)
BUN: 11 mg/dL (ref 6–24)
CHLORIDE: 104 mmol/L (ref 96–106)
CO2: 20 mmol/L (ref 20–29)
Calcium: 9.4 mg/dL (ref 8.7–10.2)
Creatinine, Ser: 0.75 mg/dL (ref 0.57–1.00)
GFR calc non Af Amer: 95 mL/min/{1.73_m2} (ref >59)
GFR, EST AFRICAN AMERICAN: 110 mL/min/{1.73_m2} (ref >59)
GLUCOSE: 87 mg/dL (ref 65–99)
Globulin, Total: 2.7 g/dL (ref 1.5–4.5)
POTASSIUM: 3.9 mmol/L (ref 3.5–5.2)
Sodium: 141 mmol/L (ref 134–144)
TOTAL PROTEIN: 7.3 g/dL (ref 6.0–8.5)

## 2018-11-14 ENCOUNTER — Ambulatory Visit (INDEPENDENT_AMBULATORY_CARE_PROVIDER_SITE_OTHER): Payer: BC Managed Care – PPO | Admitting: Family Medicine

## 2018-11-14 ENCOUNTER — Encounter: Payer: Self-pay | Admitting: Family Medicine

## 2018-11-14 ENCOUNTER — Other Ambulatory Visit: Payer: Self-pay

## 2018-11-14 VITALS — BP 134/84 | HR 86 | Temp 98.9°F | Resp 16 | Wt 177.0 lb

## 2018-11-14 DIAGNOSIS — G8929 Other chronic pain: Secondary | ICD-10-CM | POA: Diagnosis not present

## 2018-11-14 DIAGNOSIS — M5442 Lumbago with sciatica, left side: Secondary | ICD-10-CM

## 2018-11-14 MED ORDER — TIZANIDINE HCL 4 MG PO CAPS: 4.0000 mg | ORAL_CAPSULE | Freq: Three times a day (TID) | ORAL | 1 refills | Status: DC

## 2018-11-14 NOTE — Progress Notes (Signed)
1/28/202011:47 AM  Megan Lang Mar 05, 1971, 48 y.o. female [id number]  Chief Complaint  Patient presents with  . Back Pain    pt states pain has been on and off for months    HPI:   Patient is a 48 y.o. 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 0 0 No No No  Injury with Fall? 0 - - - -     Depression screen [facility] 2/9 11/14/2018 11/06/2018 08/08/2018  Decreased Interest 0 0 0  Down, Depressed, Hopeless 0 0 0  PHQ - 2 Score 0 0 0  Some recent data might be hidden    Allergies  Allergen Reactions  . Moxifloxacin Swelling    other  . Clindamycin Diarrhea and Other (See Comments)    GI upset  . Codeine Other (See Comments)    TACHYCARDIA   . Levofloxacin Other (See Comments)    other  . Metronidazole Diarrhea  . Minocin [Minocycline Hcl] Other (See Comments)    Stomach Pains  . Penicillins Rash    Has patient had a PCN reaction causing immediate rash, facial/tongue/throat swelling, SOB or lightheadedness with hypotension:yes Has patient had a PCN reaction causing severe rash involving mucus membranes or skin necrosis:no Has patient had a PCN reaction that required hospitalization:no Has patient had a PCN reaction occurring within the last 10 years:No If all of the above answers are "NO", then may proceed with Cephalosporin    Prior to Admission medications   Medication Sig Start Date End Date Taking? Authorizing Provider    albuterol (PROVENTIL HFA;VENTOLIN HFA) 108 (90 Base) MCG/ACT inhaler Inhale 1-2 puffs into the lungs every 6 (six) hours as needed for wheezing or shortness of breath. 09/03/16  Yes Elnora Morrison, MD  ALPRAZolam Duanne Moron) 0.25 MG tablet Take 1-2 tablets (0.25-0.5 mg total) by mouth at bedtime as needed for anxiety or sleep. 08/08/18  Yes McVey, Gelene Mink, PA-C  amLODipine (NORVASC) 5 MG tablet Take 1 tablet (5 mg total) by mouth daily. 07/27/18  Yes Sagardia, Ines Bloomer, MD  azelastine (ASTELIN) 0.1 % nasal spray Place 2 sprays into both nostrils 2 (two) times daily. Use in each nostril as directed 11/16/17  Yes Jeffery, Chelle, PA  bisoprolol (ZEBETA) 5 MG tablet Take 5 mg by mouth daily. 11/02/18  Yes [provider]  bisoprolol-hydrochlorothiazide (ZIAC) 5-6.25 MG tablet Take 1 tablet by mouth daily. 07/27/18  Yes Sagardia, Ines Bloomer, MD  busPIRone (BUSPAR) 15 MG tablet Take 1 tablet (15 mg total) by mouth 2 (two) times daily. Patient taking differently: Take 30 mg by mouth daily.  08/09/18  Yes Derrill Center, NP  escitalopram (LEXAPRO) 10 MG tablet Take 2 tablets (20 mg total) by mouth daily. 08/17/18  Yes McVey, Gelene Mink, PA-C  etonogestrel (NEXPLANON) 68 MG IMPL implant 1 each by Subdermal route once.   Yes [provider]  fluticasone (FLONASE) 50 MCG/ACT nasal spray Place 1 spray into the nose daily as  needed for allergies.  02/27/18 02/27/19 Yes [provider]  pantoprazole (PROTONIX) 40 MG tablet Take 40 mg by mouth daily.  07/31/18  Yes [provider]  Polyethyl Glycol-Propyl Glycol (LUBRICANT EYE DROPS) 0.4-0.3 % SOLN Place 1 drop into both eyes 3 (three) times daily as needed (for dry/irritated eyes.).   Yes [provider]  potassium chloride SA (K-DUR,KLOR-CON) 20 MEQ tablet Take 2 tablets (40 mEq total) by mouth 2 (two) times daily. 07/20/18  Yes Mesner, Corene Cornea, MD  sodium chloride (OCEAN) 0.65 % SOLN nasal spray Place 1  spray into both nostrils daily as needed for congestion.    Yes [provider]  traZODone (DESYREL) 50 MG tablet Take 2 tablets (100 mg total) by mouth at bedtime. 08/22/18  Yes Derrill Center, NP    Past Medical History:  Diagnosis Date  . Acid reflux   . Allergy   . Anxiety   . Aortic aneurysm ([place])   . Arthritis   . Asthma   . Cardiomegaly   . Carpal tunnel syndrome   . Depression   . Gastric polyp   . GERD (gastroesophageal reflux disease) 01/23/2016  . Hiatal hernia   . Hypertension   . Normal spontaneous vaginal delivery    3    Past Surgical History:  Procedure Laterality Date  . ANTERIOR CERVICAL DECOMPRESSION/DISCECTOMY FUSION 4 LEVELS N/A 08/31/2018   Procedure: ANTERIOR CERVICAL DECOMPRESSION/DISCECTOMY FUSION C3-7;  Surgeon: Melina Schools, MD;  Location: [facility];  Service: Orthopedics;  Laterality: N/A;  6 hrs  . CARPAL TUNNEL RELEASE  2009  . CARPAL TUNNEL RELEASE Left   . COLONOSCOPY    . NASAL SINUS SURGERY     DEC 15,2016  . ROTATOR CUFF REPAIR Left 04/14/2018    Social History   Tobacco Use  . Smoking status: Never Smoker  . Smokeless tobacco: Never Used  Substance Use Topics  . Alcohol use: No    Alcohol/week: 0.0 standard drinks    Family History  Problem Relation Age of Onset  . Hypertension Mother   . Kidney disease Father   . Liver disease Father        Unknown diagnosis  . Breast cancer Maternal Grandmother   . Hypertension Sister   . Hypertension Brother   . Drug abuse Brother     ROS Per hpi  OBJECTIVE:  Blood pressure 134/84, pulse 86, temperature 98.9 F (37.2 C), temperature source Oral, resp. rate 16, weight 177 lb (80.3 kg), SpO2 98 %. Body mass index is 32.37 kg/m.   Physical Exam Vitals signs and nursing note reviewed.  Constitutional:      Appearance: She is well-developed.  HENT:     Head: Normocephalic and atraumatic.  Eyes:     General: No scleral icterus.    Conjunctiva/sclera: Conjunctivae normal.       Pupils: Pupils are equal, round, and reactive to light.  Neck:     Musculoskeletal: Neck supple.  Pulmonary:     Effort: Pulmonary effort is normal.  Musculoskeletal:     Lumbar back: She exhibits spasm (left paraspinal). She exhibits no bony tenderness.  Skin:    General: Skin is warm and dry.  Neurological:     Mental Status: She is alert and oriented to person, place, and time.     ASSESSMENT and PLAN  1. Chronic left-sided low back pain with left-sided sciatica Discussed supportive measures, new meds r/se/b and RTC precautions.  Other orders - tiZANidine (ZANAFLEX) 4 MG capsule;  Take 1 capsule (4 mg total) by mouth 3 (three) times daily.   Return if symptoms worsen or fail to improve.    Rutherford Guys, MD [facility] [place], [place] [address] Ph.  [phone] Fax [phone]

## 2018-11-14 NOTE — Patient Instructions (Signed)
° ° ° °  If you have lab work done today you will be contacted with your lab results within the next 2 weeks.  If you have not heard from us then please contact us. The fastest way to get your results is to register for My Chart. ° ° °IF you received an x-ray today, you will receive an invoice from [facility]. Please contact [facility] at [phone] with questions or concerns regarding your invoice.  ° °IF you received labwork today, you will receive an invoice from [facility]. Please contact [facility] at [phone] with questions or concerns regarding your invoice.  ° °Our billing staff will not be able to assist you with questions regarding bills from these companies. ° °You will be contacted with the lab results as soon as they are available. The fastest way to get your results is to activate your My Chart account. Instructions are located on the last page of this paperwork. If you have not heard from us regarding the results in 2 weeks, please contact this office. °  ° ° ° °

## 2018-11-16 ENCOUNTER — Other Ambulatory Visit: Payer: Self-pay | Admitting: Cardiothoracic Surgery

## 2018-11-16 DIAGNOSIS — I712 Thoracic aortic aneurysm, without rupture, unspecified: Secondary | ICD-10-CM

## 2018-11-16 DIAGNOSIS — Z8679 Personal history of other diseases of the circulatory system: Secondary | ICD-10-CM

## 2018-11-20 ENCOUNTER — Ambulatory Visit (INDEPENDENT_AMBULATORY_CARE_PROVIDER_SITE_OTHER): Payer: BC Managed Care – PPO | Admitting: Psychology

## 2018-11-20 DIAGNOSIS — F411 Generalized anxiety disorder: Secondary | ICD-10-CM

## 2018-11-23 ENCOUNTER — Ambulatory Visit: Payer: 59 | Admitting: Family Medicine

## 2018-11-30 ENCOUNTER — Other Ambulatory Visit: Payer: Self-pay | Admitting: Obstetrics & Gynecology

## 2018-11-30 DIAGNOSIS — Z1231 Encounter for screening mammogram for malignant neoplasm of breast: Secondary | ICD-10-CM

## 2018-12-01 ENCOUNTER — Ambulatory Visit (INDEPENDENT_AMBULATORY_CARE_PROVIDER_SITE_OTHER): Payer: BC Managed Care – PPO | Admitting: Obstetrics & Gynecology

## 2018-12-01 ENCOUNTER — Encounter: Payer: Self-pay | Admitting: Obstetrics & Gynecology

## 2018-12-01 VITALS — BP 128/78 | Ht 62.0 in | Wt 179.0 lb

## 2018-12-01 DIAGNOSIS — Z3046 Encounter for surveillance of implantable subdermal contraceptive: Secondary | ICD-10-CM | POA: Diagnosis not present

## 2018-12-01 DIAGNOSIS — Z6832 Body mass index (BMI) 32.0-32.9, adult: Secondary | ICD-10-CM | POA: Diagnosis not present

## 2018-12-01 DIAGNOSIS — Z01419 Encounter for gynecological examination (general) (routine) without abnormal findings: Secondary | ICD-10-CM | POA: Diagnosis not present

## 2018-12-01 DIAGNOSIS — E6609 Other obesity due to excess calories: Secondary | ICD-10-CM | POA: Diagnosis not present

## 2018-12-01 NOTE — Patient Instructions (Signed)
1. Well female exam with routine gynecological exam Normal gynecologic exam.  Pap test in February 2019 was negative with negative high-risk HPV, no indication to repeat this year.  Breast exam normal.  Will schedule screening mammogram March 2020.  Health labs with family physician done recently.  2. Encounter for surveillance of implantable subdermal contraceptive Well on Nexplanon since October 2017.  Will schedule for a change in Nexplanon September 2020.  3. Class 1 obesity due to excess calories without serious comorbidity with body mass index (BMI) of 32.0 to 32.9 in adult Recommend a lower calorie/carb diet such as [employer].  Aerobic physical activities 5 times a week and weightlifting every 2 days.  Megan Lang, it was a pleasure seeing you today!

## 2018-12-01 NOTE — Progress Notes (Signed)
Megan Lang [birth year] [id number]   History:    48 y.o. G3P3L3 Married.  3 daughters 24-26-28  RP:  Established patient presenting for annual gyn exam   HPI: Well on Nexplanon since October 2017.  Occasional light menses.  No pelvic pain.  No pain with intercourse.  Normal vaginal secretions.  Breasts normal.  Body mass index 32.74.  Walks regularly.  Health labs recently done with family physician.  Past medical history,surgical history, family history and social history were all reviewed and documented in the EPIC chart.  Gynecologic History No LMP recorded. Patient has had an implant. Contraception: Nexplanon and x 07/2016 Last Pap: 11/2017. Results were: Negative/HPV HR negative Last mammogram: 12/2017. Results were: Negative.  Lt Dx mammo/US 07/2018 Negative Bone Density: Never Colonoscopy: Never  Obstetric History OB History  Gravida Para Term Preterm AB Living  3 3 3     3   SAB TAB Ectopic Multiple Live Births          3    # Outcome Date GA Lbr Len/2nd Weight Sex Delivery Anes PTL Lv  3 Term     F Vag-Spont  N LIV  2 Term     F Vag-Spont  N LIV  1 Term     F Vag-Spont  N LIV     ROS: A ROS was performed and pertinent positives and negatives are included in the history.  GENERAL: No fevers or chills. HEENT: No change in vision, no earache, sore throat or sinus congestion. NECK: No pain or stiffness. CARDIOVASCULAR: No chest pain or pressure. No palpitations. PULMONARY: No shortness of breath, cough or wheeze. GASTROINTESTINAL: No abdominal pain, nausea, vomiting or diarrhea, melena or bright red blood per rectum. GENITOURINARY: No urinary frequency, urgency, hesitancy or dysuria. MUSCULOSKELETAL: No joint or muscle pain, no back pain, no recent trauma. DERMATOLOGIC: No rash, no itching, no lesions. ENDOCRINE: No polyuria, polydipsia, no heat or cold intolerance. No recent change in weight. HEMATOLOGICAL: No anemia or easy bruising or bleeding. NEUROLOGIC: No headache,  seizures, numbness, tingling or weakness. PSYCHIATRIC: No depression, no loss of interest in normal activity or change in sleep pattern.     Exam:   BP 128/78   Ht 5\' 2"  (1.575 m)   Wt 179 lb (81.2 kg)   BMI 32.74 kg/m   Body mass index is 32.74 kg/m.  General appearance : Well developed well nourished female. No acute distress HEENT: Eyes: no retinal hemorrhage or exudates,  Neck supple, trachea midline, no carotid bruits, no thyroidmegaly Lungs: Clear to auscultation, no rhonchi or wheezes, or rib retractions  Heart: Regular rate and rhythm, no murmurs or gallops Breast:Examined in sitting and supine position were symmetrical in appearance, no palpable masses or tenderness,  no skin retraction, no nipple inversion, no nipple discharge, no skin discoloration, no axillary or supraclavicular lymphadenopathy Abdomen: no palpable masses or tenderness, no rebound or guarding Extremities: no edema or skin discoloration or tenderness  Pelvic: Vulva: Normal             Vagina: No gross lesions or discharge  Cervix: No gross lesions or discharge  Uterus  AV, normal size, shape and consistency, non-tender and mobile  Adnexa  Without masses or tenderness  Anus: Normal   Assessment/Plan:  48 y.o. female for annual exam   1. Well female exam with routine gynecological exam Normal gynecologic exam.  Pap test in February 2019 was negative with negative high-risk HPV, no indication to repeat this year.  Breast exam normal.  Will schedule screening mammogram March 2020.  Health labs with family physician done recently.  2. Encounter for surveillance of implantable subdermal contraceptive Well on Nexplanon since October 2017.  Will schedule for a change in Nexplanon September 2020.  3. Class 1 obesity due to excess calories without serious comorbidity with body mass index (BMI) of 32.0 to 32.9 in adult Recommend a lower calorie/carb diet such as [employer].  Aerobic physical activities 5  times a week and weightlifting every 2 days.  Princess Bruins MD, 8:18 AM 12/01/2018

## 2018-12-04 ENCOUNTER — Ambulatory Visit: Payer: BC Managed Care – PPO | Admitting: Psychology

## 2018-12-15 ENCOUNTER — Ambulatory Visit (INDEPENDENT_AMBULATORY_CARE_PROVIDER_SITE_OTHER): Payer: BC Managed Care – PPO | Admitting: Psychology

## 2018-12-15 DIAGNOSIS — F411 Generalized anxiety disorder: Secondary | ICD-10-CM | POA: Diagnosis not present

## 2018-12-21 ENCOUNTER — Other Ambulatory Visit: Payer: Self-pay | Admitting: Family Medicine

## 2018-12-21 DIAGNOSIS — I10 Essential (primary) hypertension: Secondary | ICD-10-CM

## 2018-12-21 NOTE — Telephone Encounter (Signed)
Copied from [place] [phone] [phone]. Topic: Quick Communication - Rx Refill/Question >> Dec 21, 2018  4:48 PM Parke Poisson wrote: Medication: amLODipine (NORVASC) 5 MG tablet   Has the patient contacted their pharmacy? At pharmacy now,states no refills  (Agent: If yes, when and what did the pharmacy advise?)  Preferred Pharmacy (with phone number or street name): [place], [place] - [address] [phone] (Phone) [phone] (Fax)    Agent: Please be advised that RX refills may take up to 3 business days. We ask that you follow-up with your pharmacy.

## 2018-12-22 MED ORDER — AMLODIPINE BESYLATE 5 MG PO TABS: 5.0000 mg | ORAL_TABLET | Freq: Every day | ORAL | 1 refills | Status: DC

## 2018-12-29 ENCOUNTER — Other Ambulatory Visit: Payer: Self-pay

## 2018-12-29 ENCOUNTER — Ambulatory Visit (INDEPENDENT_AMBULATORY_CARE_PROVIDER_SITE_OTHER): Payer: BC Managed Care – PPO | Admitting: Psychology

## 2018-12-29 DIAGNOSIS — F411 Generalized anxiety disorder: Secondary | ICD-10-CM | POA: Diagnosis not present

## 2019-01-01 ENCOUNTER — Ambulatory Visit
Admission: RE | Admit: 2019-01-01 | Discharge: 2019-01-01 | Disposition: A | Discharge status: Home / Self Care | Payer: 59 | Source: Ambulatory Visit | Attending: Obstetrics & Gynecology | Admitting: Obstetrics & Gynecology

## 2019-01-01 ENCOUNTER — Other Ambulatory Visit: Payer: Self-pay

## 2019-01-01 DIAGNOSIS — Z1231 Encounter for screening mammogram for malignant neoplasm of breast: Secondary | ICD-10-CM

## 2019-01-03 ENCOUNTER — Other Ambulatory Visit: Payer: Self-pay | Admitting: Cardiothoracic Surgery

## 2019-01-03 ENCOUNTER — Ambulatory Visit
Admission: RE | Admit: 2019-01-03 | Discharge: 2019-01-03 | Disposition: A | Discharge status: Home / Self Care | Payer: BC Managed Care – PPO | Source: Ambulatory Visit | Attending: Cardiothoracic Surgery | Admitting: Cardiothoracic Surgery

## 2019-01-03 ENCOUNTER — Ambulatory Visit: Payer: BC Managed Care – PPO | Admitting: Cardiothoracic Surgery

## 2019-01-03 ENCOUNTER — Other Ambulatory Visit: Payer: Self-pay

## 2019-01-03 DIAGNOSIS — I712 Thoracic aortic aneurysm, without rupture, unspecified: Secondary | ICD-10-CM

## 2019-01-03 MED ORDER — IOPAMIDOL (ISOVUE-370) INJECTION 76%
75.0000 mL | Freq: Once | INTRAVENOUS | Status: AC | PRN
  Administered 2019-01-03: 75 mL via INTRAVENOUS

## 2019-01-12 ENCOUNTER — Ambulatory Visit (INDEPENDENT_AMBULATORY_CARE_PROVIDER_SITE_OTHER): Payer: BC Managed Care – PPO | Admitting: Psychology

## 2019-01-12 ENCOUNTER — Other Ambulatory Visit: Payer: Self-pay

## 2019-01-12 DIAGNOSIS — F411 Generalized anxiety disorder: Secondary | ICD-10-CM

## 2019-01-25 ENCOUNTER — Ambulatory Visit (INDEPENDENT_AMBULATORY_CARE_PROVIDER_SITE_OTHER): Payer: BC Managed Care – PPO | Admitting: Psychology

## 2019-01-25 DIAGNOSIS — F411 Generalized anxiety disorder: Secondary | ICD-10-CM

## 2019-01-26 IMAGING — US ULTRASOUND LEFT BREAST LIMITED
1 series · 4 of 4 positions shown · non-contrast
Comparison: Previous exams including most recent bilateral
screening mammogram dated 12/30/2017.

CLINICAL DATA: Patient describes LEFT breast heaviness and pain.
Recent rotator cuff surgery.

EXAM:
DIGITAL DIAGNOSTIC LEFT MAMMOGRAM WITH CAD AND TOMO
ULTRASOUND LEFT BREAST

[Series 1: ultrasound left breast limited · 0.06mm/px · 4 of 4 slices shown]
[im 1/4]
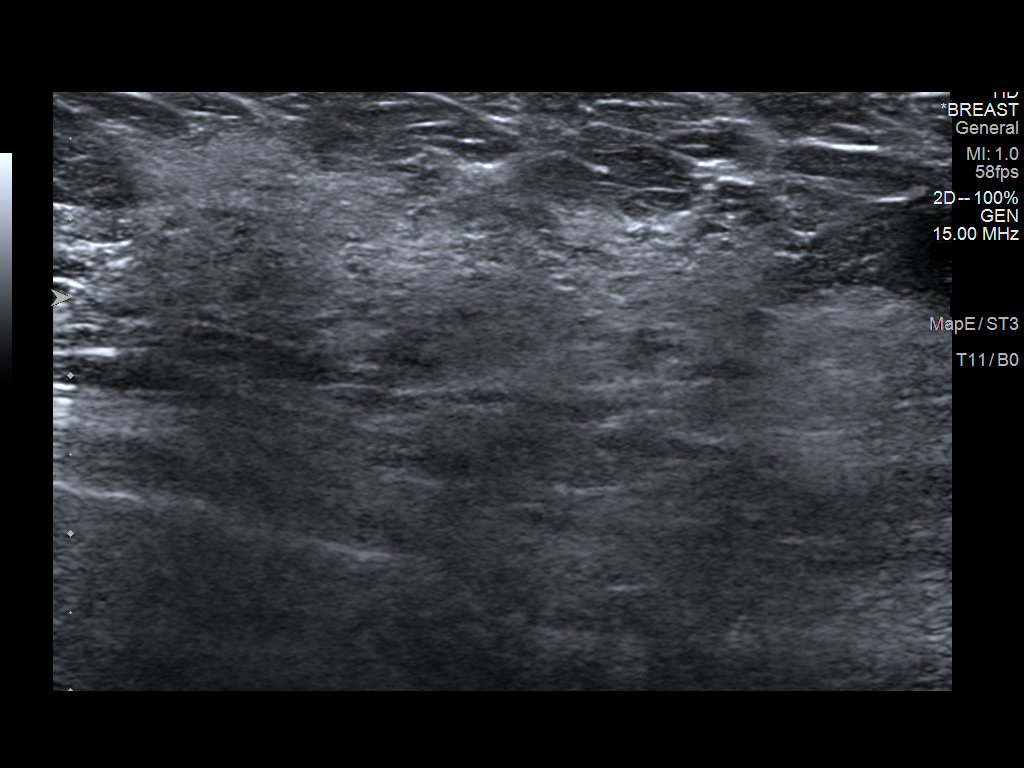
[im 2/4]
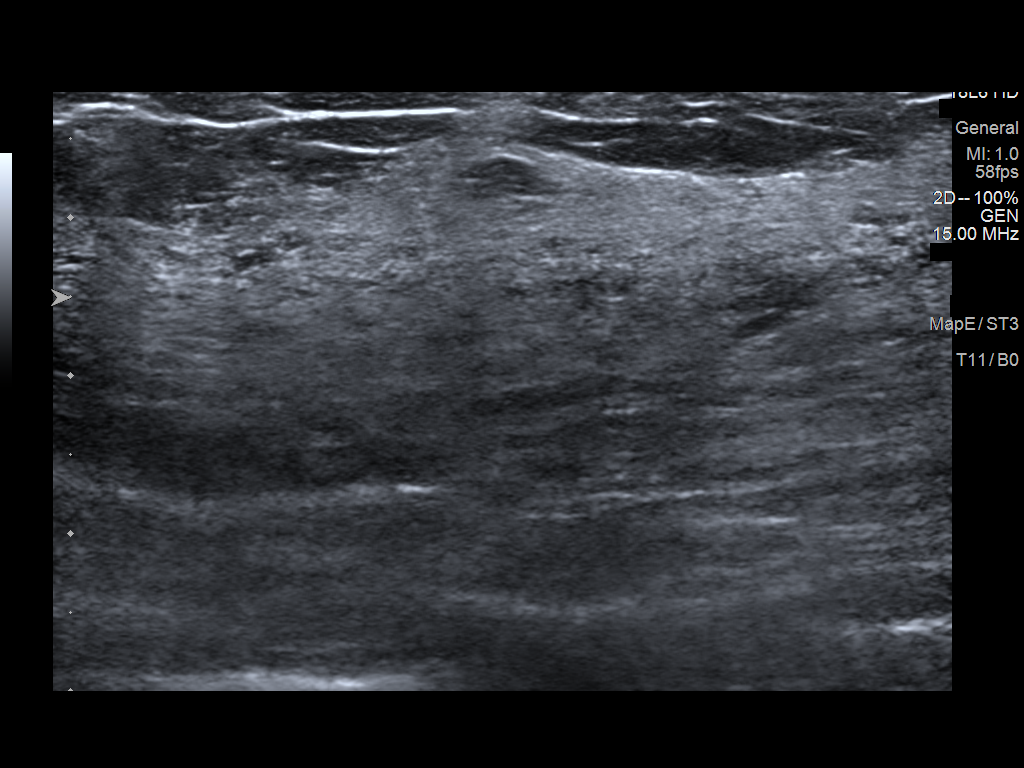
[im 3/4]
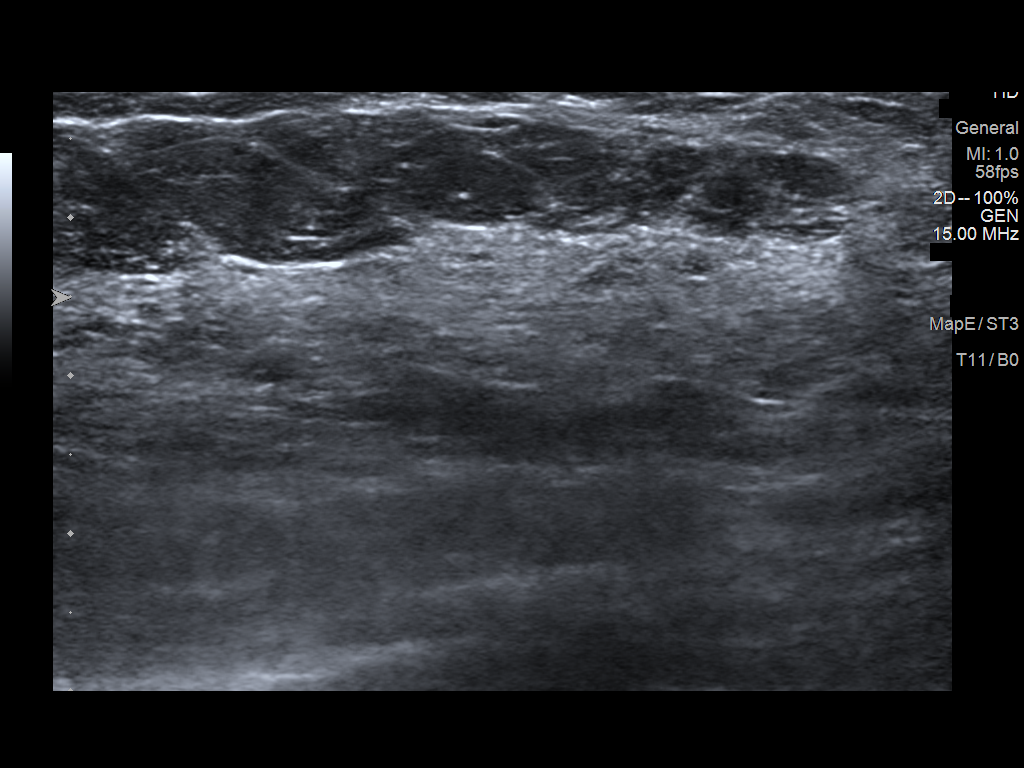
[im 4/4]
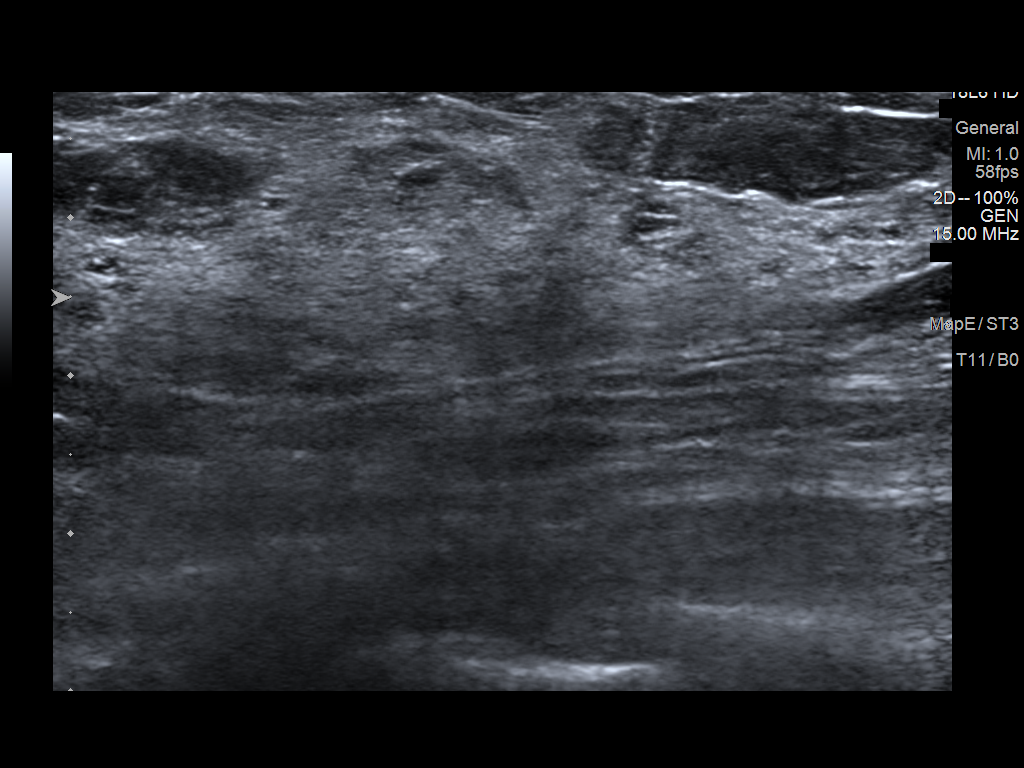

[4 of 4 positions shown; findings below may reference images not displayed]

ACR Breast Density Category c: The breast tissue is heterogeneously
dense, which may obscure small masses.
FINDINGS: There are no new dominant masses, suspicious calcifications or
secondary signs of malignancy within the LEFT breast.

Mammographic images were processed with CAD.

Targeted ultrasound is performed, evaluating the upper and outer
LEFT, showing only normal fibroglandular tissues and fat lobules
throughout. No solid or cystic mass. No soft tissue edema or skin
thickening.
IMPRESSION: No evidence of malignancy within the LEFT breast.

RECOMMENDATION:
1. Annual screening mammograms. Next bilateral screening mammogram
will be due in Monday December, 2018 corresponding to patient's routine
RIGHT breast screening mammogram schedule.
2. The patient was instructed to return sooner if the area that she
feels becomes larger and/or firmer to palpation, or if a new
palpable abnormality is identified in either breast.

I have discussed the findings and recommendations with the patient.
Results were also provided in writing at the conclusion of the
visit. If applicable, a reminder letter will be sent to the patient
regarding the next appointment.

BI-RADS CATEGORY  1: Negative.

## 2019-01-26 IMAGING — [facility] DIGITAL DIAGNOSTIC UNILATERAL LEFT MAMMOGRAM WITH TOMO AND CAD
4 series · 4 of 12 positions shown · non-contrast
Comparison: Previous exams including most recent bilateral
screening mammogram dated 12/30/2017.

CLINICAL DATA: Patient describes LEFT breast heaviness and pain.
Recent rotator cuff surgery.

EXAM:
DIGITAL DIAGNOSTIC LEFT MAMMOGRAM WITH CAD AND TOMO
ULTRASOUND LEFT BREAST

[L MLO synth-2D]
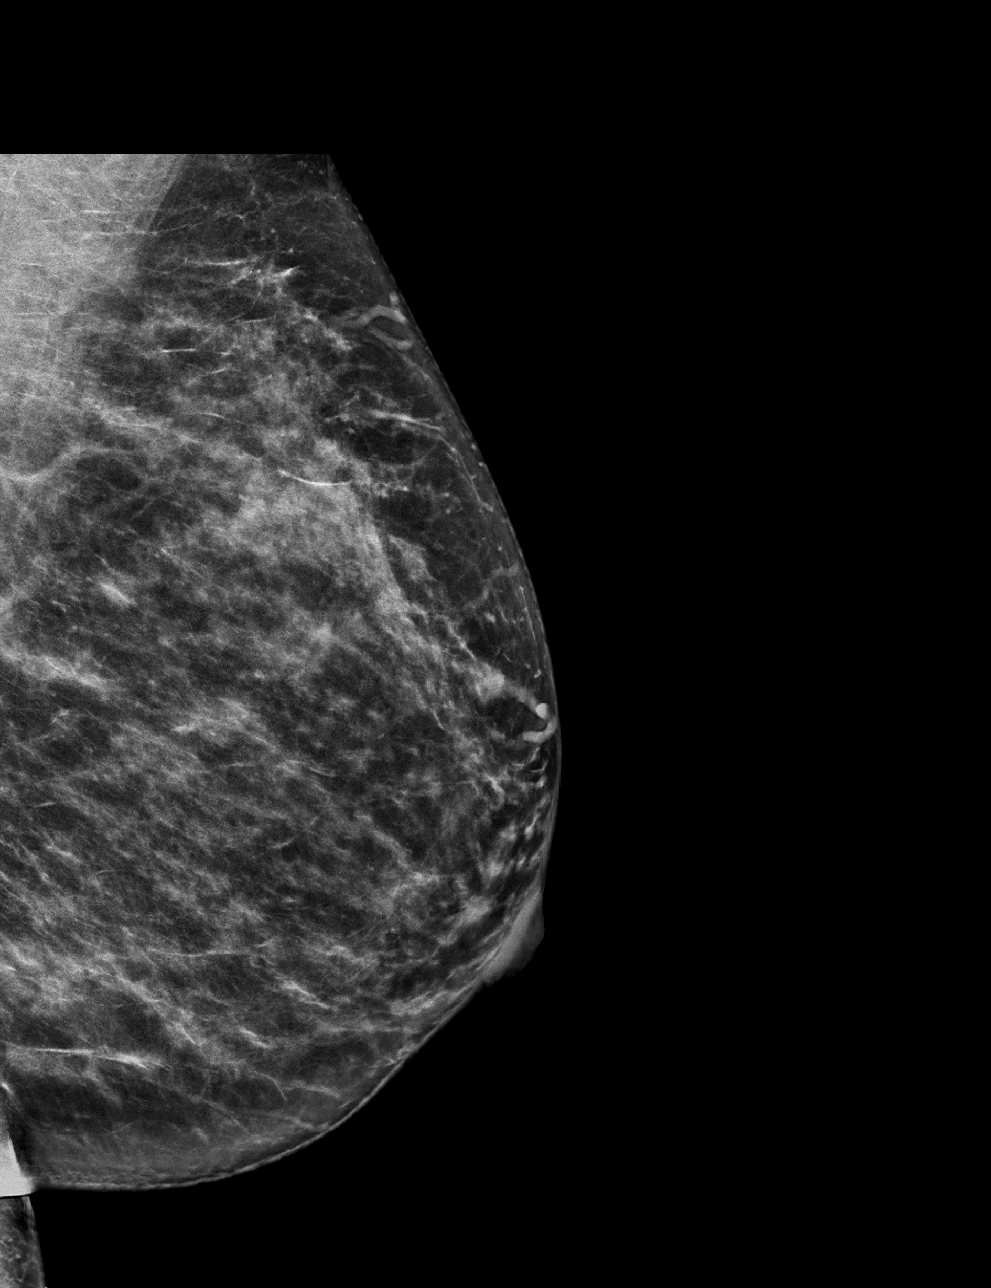

[L CC synth-2D]
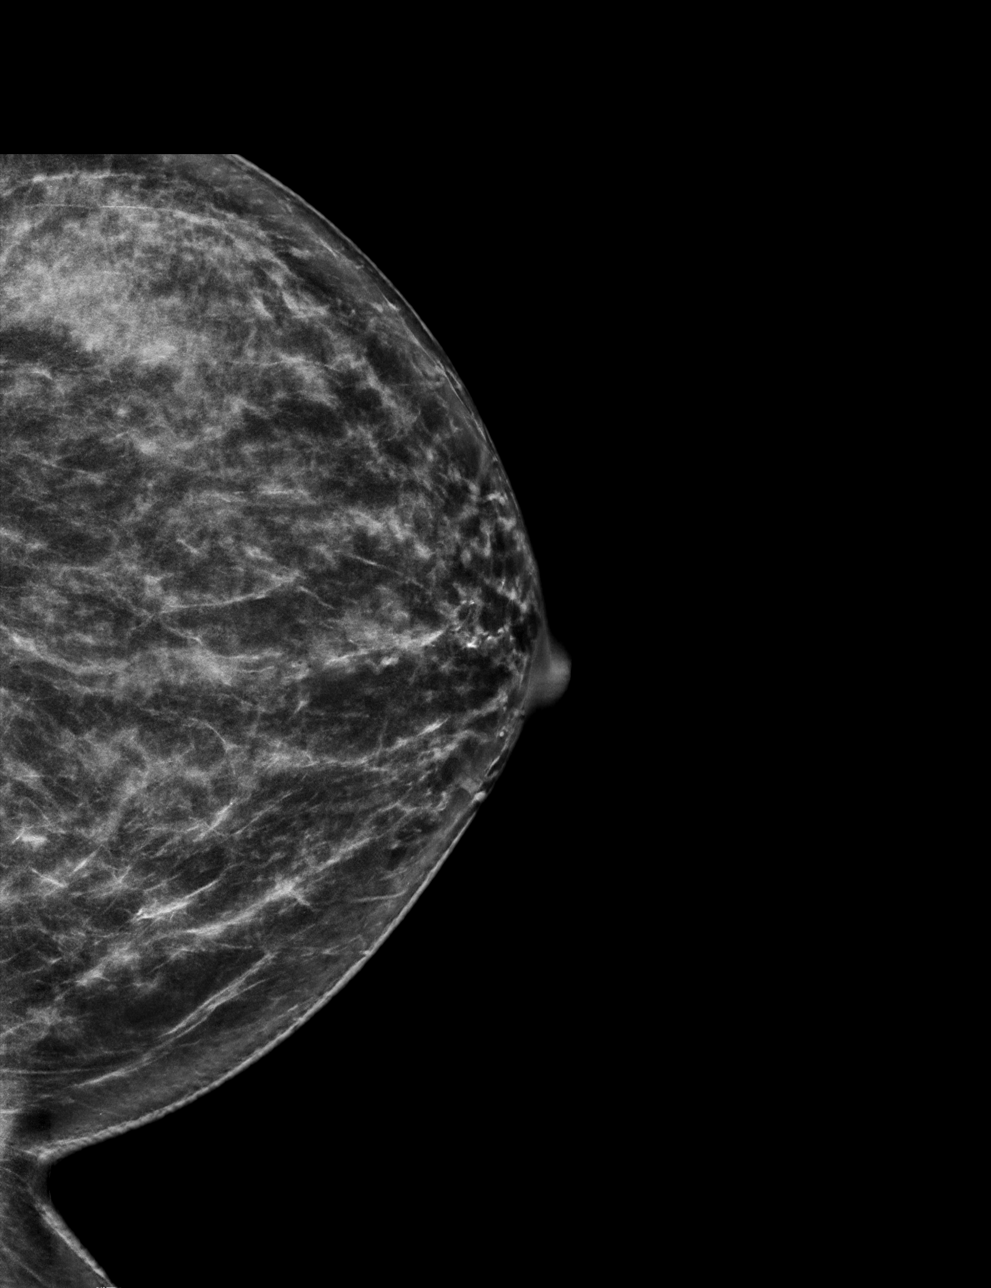

[L MLO tomo · tomo slice 34/67.0]
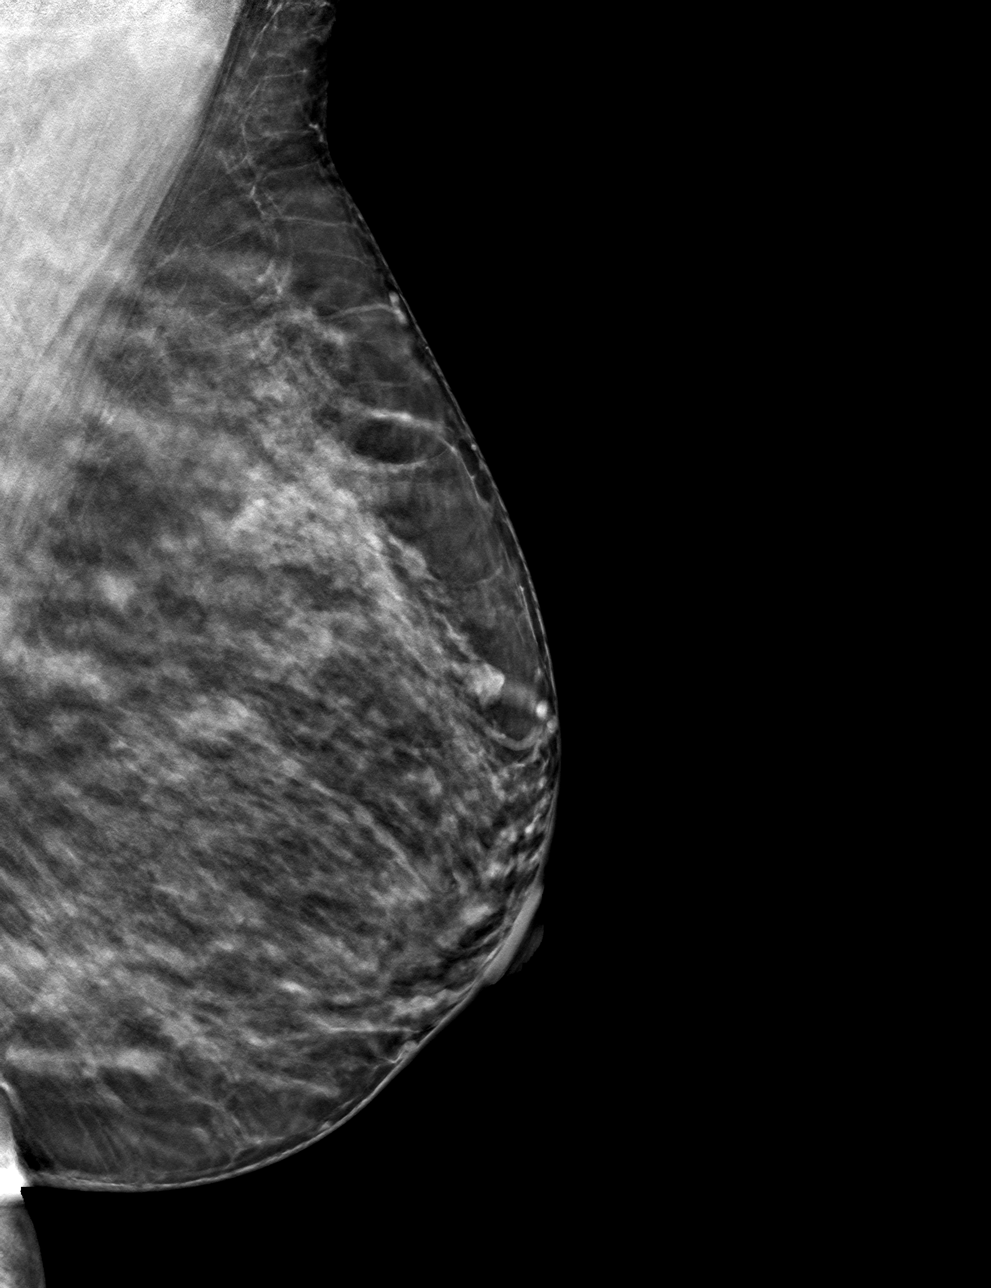

[L CC tomo · tomo slice 33/64.0]
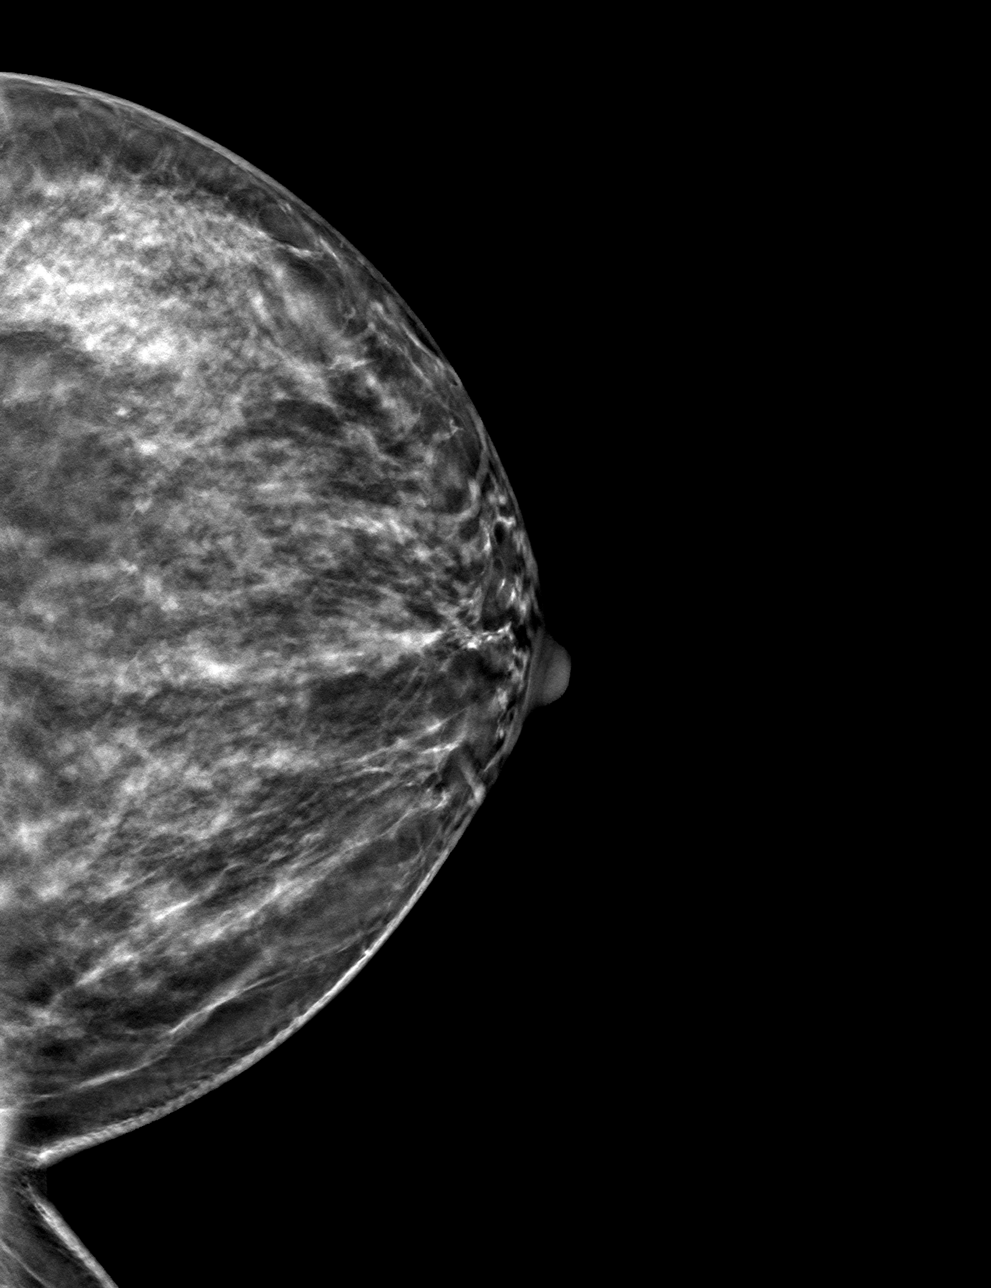

[4 of 12 positions shown; findings below may reference images not displayed]

ACR Breast Density Category c: The breast tissue is heterogeneously
dense, which may obscure small masses.
FINDINGS: There are no new dominant masses, suspicious calcifications or
secondary signs of malignancy within the LEFT breast.

Mammographic images were processed with CAD.

Targeted ultrasound is performed, evaluating the upper and outer
LEFT, showing only normal fibroglandular tissues and fat lobules
throughout. No solid or cystic mass. No soft tissue edema or skin
thickening.
IMPRESSION: No evidence of malignancy within the LEFT breast.

RECOMMENDATION:
1. Annual screening mammograms. Next bilateral screening mammogram
will be due in Monday December, 2018 corresponding to patient's routine
RIGHT breast screening mammogram schedule.
2. The patient was instructed to return sooner if the area that she
feels becomes larger and/or firmer to palpation, or if a new
palpable abnormality is identified in either breast.

I have discussed the findings and recommendations with the patient.
Results were also provided in writing at the conclusion of the
visit. If applicable, a reminder letter will be sent to the patient
regarding the next appointment.

BI-RADS CATEGORY  1: Negative.

## 2019-02-08 ENCOUNTER — Ambulatory Visit (INDEPENDENT_AMBULATORY_CARE_PROVIDER_SITE_OTHER): Payer: BC Managed Care – PPO | Admitting: Psychology

## 2019-02-08 DIAGNOSIS — F411 Generalized anxiety disorder: Secondary | ICD-10-CM | POA: Diagnosis not present

## 2019-02-14 ENCOUNTER — Ambulatory Visit: Payer: BC Managed Care – PPO | Admitting: Cardiothoracic Surgery

## 2019-02-21 ENCOUNTER — Telehealth: Payer: BC Managed Care – PPO | Admitting: Cardiothoracic Surgery

## 2019-02-21 ENCOUNTER — Telehealth (INDEPENDENT_AMBULATORY_CARE_PROVIDER_SITE_OTHER): Payer: BC Managed Care – PPO | Admitting: Cardiothoracic Surgery

## 2019-02-21 ENCOUNTER — Other Ambulatory Visit: Payer: Self-pay

## 2019-02-21 DIAGNOSIS — I714 Abdominal aortic aneurysm, without rupture: Secondary | ICD-10-CM | POA: Diagnosis not present

## 2019-02-21 NOTE — Telephone Encounter (Signed)
[address]       [place] [address]             [phone]     CARDIOTHORACIC SURGERY TELEPHONE VIRTUAL OFFICE NOTE  Referring Provider is No ref. provider found Primary Cardiologist is No primary care provider on file. PCP is Billie Ruddy, MD   HPI:  I spoke with Megan Lang (DOB 07-Jan-1971 ) via telephone on 02/21/2019 at 10:50 AM and verified that I was speaking with the correct person using more than one form of identification.  We discussed the reason(s) for conducting our visit virtually instead of in-person.  The patient expressed understanding the circumstances and agreed to proceed as described.  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  This was picked up as an incidental finding on a CT scan.  There is been no change in the aorta since the initial diagnosis.  The patient remains asymptomatic.  I personally reviewed the images of her most recent CTA March 2020 which shows the ascending aorta to remain at 4.1 cm diameter.  The aortic wall is smooth without intramural hematoma or ulceration.  The remainder of the thoracic aorta is without abnormality and of normal size.  There is a small residual remnant of thymic tissue of no importance.  Patient has been compliant with her blood pressure medications.  Reviewing the medical record of her office visit in March 2020 her blood pressure was 125/75.  She knows the importance of heart healthy lifestyle including 30 minutes of aerobic activity 5 days out of 7.  We discussed the fact that currently she is at very small risk of aortic tear-dissection at the current size and her compliance with blood pressure medication.  I did recommend that we plan another surveillance scan in approximately 18 months which the office will set up.   Current Outpatient Medications  Medication Sig Dispense Refill  . albuterol (PROVENTIL HFA;VENTOLIN HFA) 108 (90  Base) MCG/ACT inhaler Inhale 1-2 puffs into the lungs every 6 (six) hours as needed for wheezing or shortness of breath. 1 Inhaler 0  . ALPRAZolam (XANAX) 0.25 MG tablet Take 1-2 tablets (0.25-0.5 mg total) by mouth at bedtime as needed for anxiety or sleep. 40 tablet 1  . amLODipine (NORVASC) 5 MG tablet TAKE 1 TABLET BY MOUTH ONCE DAILY 90 tablet 0  . amLODipine (NORVASC) 5 MG tablet Take 1 tablet (5 mg total) by mouth daily. 90 tablet 1  . azelastine (ASTELIN) 0.1 % nasal spray Place 2 sprays into both nostrils 2 (two) times daily. Use in each nostril as directed 30 mL 0  . bisoprolol (ZEBETA) 5 MG tablet Take 5 mg by mouth daily.    . bisoprolol-hydrochlorothiazide (ZIAC) 5-6.25 MG tablet Take 1 tablet by mouth daily. 90 tablet 1  . busPIRone (BUSPAR) 15 MG tablet Take 1 tablet (15 mg total) by mouth 2 (two) times daily. (Patient taking differently: Take 30 mg by mouth daily. ) 60 tablet 0  . escitalopram (LEXAPRO) 10 MG tablet Take 2 tablets (20 mg total) by mouth daily. 60 tablet 2  . etonogestrel (NEXPLANON) 68 MG IMPL implant 1 each by Subdermal route once.    . fluticasone (FLONASE) 50 MCG/ACT nasal spray Place 1 spray into the nose daily as needed for allergies.     . pantoprazole (PROTONIX) 40 MG tablet Take 40 mg by mouth daily.   5  .  Polyethyl Glycol-Propyl Glycol (LUBRICANT EYE DROPS) 0.4-0.3 % SOLN Place 1 drop into both eyes 3 (three) times daily as needed (for dry/irritated eyes.).    Marland Kitchen potassium chloride SA (K-DUR,KLOR-CON) 20 MEQ tablet Take 2 tablets (40 mEq total) by mouth 2 (two) times daily. 20 tablet 0  . sodium chloride (OCEAN) 0.65 % SOLN nasal spray Place 1 spray into both nostrils daily as needed for congestion.     Marland Kitchen tiZANidine (ZANAFLEX) 4 MG capsule Take 1 capsule (4 mg total) by mouth 3 (three) times daily. 30 capsule 1  . traZODone (DESYREL) 50 MG tablet Take 2 tablets (100 mg total) by mouth at bedtime. 60 tablet 0   No current facility-administered medications  for this visit.      Diagnostic Tests:  CTA of thoracic aorta performed January 03, 2019 personally reviewed and discussed with patient showing stable very minimal dilatation of ascending aorta 4.1 cm.   Impression:  Patient not at risk for aortic dissection with current aortic diameter and well-controlled blood pressure.  Plan:  Continue with heart healthy lifestyle including blood pressure control, compliance of medications, and regular aerobic exercise.  Continue with intermittent surveillance scan of her thoracic aorta to monitor size.  Neck scan in approximately 18- 20 months.    I discussed limitations of evaluation and management via telephone.  The patient was advised to call back for repeat telephone consultation or to seek an in-person evaluation if questions arise or the patient's clinical condition changes in any significant manner.  I spent in excess of 69minutes of non-face-to-face time during the conduct of this telephone virtual office consultation.  Level 1  (99441)             5-10 minutes Level 2  (99442)            11-20 minutes Level 3  (99443)            21-30 minutes   02/21/2019 10:50 AM

## 2019-02-22 ENCOUNTER — Ambulatory Visit (INDEPENDENT_AMBULATORY_CARE_PROVIDER_SITE_OTHER): Payer: BC Managed Care – PPO | Admitting: Psychology

## 2019-02-22 DIAGNOSIS — F411 Generalized anxiety disorder: Secondary | ICD-10-CM

## 2019-03-08 ENCOUNTER — Ambulatory Visit (INDEPENDENT_AMBULATORY_CARE_PROVIDER_SITE_OTHER): Payer: BC Managed Care – PPO | Admitting: Psychology

## 2019-03-08 DIAGNOSIS — F411 Generalized anxiety disorder: Secondary | ICD-10-CM

## 2019-03-22 ENCOUNTER — Ambulatory Visit (INDEPENDENT_AMBULATORY_CARE_PROVIDER_SITE_OTHER): Payer: BC Managed Care – PPO | Admitting: Psychology

## 2019-03-22 DIAGNOSIS — F411 Generalized anxiety disorder: Secondary | ICD-10-CM | POA: Diagnosis not present

## 2019-04-11 ENCOUNTER — Ambulatory Visit: Payer: BC Managed Care – PPO | Admitting: Cardiothoracic Surgery

## 2019-04-19 ENCOUNTER — Ambulatory Visit: Payer: BC Managed Care – PPO | Admitting: Psychology

## 2019-04-26 ENCOUNTER — Ambulatory Visit (INDEPENDENT_AMBULATORY_CARE_PROVIDER_SITE_OTHER): Payer: BC Managed Care – PPO | Admitting: Psychology

## 2019-04-26 DIAGNOSIS — F411 Generalized anxiety disorder: Secondary | ICD-10-CM | POA: Diagnosis not present

## 2019-05-24 ENCOUNTER — Ambulatory Visit (INDEPENDENT_AMBULATORY_CARE_PROVIDER_SITE_OTHER): Payer: BC Managed Care – PPO | Admitting: Psychology

## 2019-05-24 DIAGNOSIS — F411 Generalized anxiety disorder: Secondary | ICD-10-CM

## 2019-06-21 ENCOUNTER — Ambulatory Visit: Payer: Self-pay | Admitting: Psychology

## 2019-07-02 ENCOUNTER — Other Ambulatory Visit: Payer: Self-pay

## 2019-07-03 ENCOUNTER — Ambulatory Visit (INDEPENDENT_AMBULATORY_CARE_PROVIDER_SITE_OTHER): Payer: BC Managed Care – PPO | Admitting: Obstetrics & Gynecology

## 2019-07-03 ENCOUNTER — Encounter: Payer: Self-pay | Admitting: Obstetrics & Gynecology

## 2019-07-03 VITALS — BP 136/88

## 2019-07-03 DIAGNOSIS — Z3046 Encounter for surveillance of implantable subdermal contraceptive: Secondary | ICD-10-CM

## 2019-07-03 DIAGNOSIS — Z30017 Encounter for initial prescription of implantable subdermal contraceptive: Secondary | ICD-10-CM

## 2019-07-03 NOTE — Progress Notes (Signed)
Megan Lang 1971/05/04 [id number]        48 y.o.  [id number] Married.  3 daughters.  RP: Remove Nexplanon, insert a new Nexplanon  HPI: Change Nexplanon after 3 yrs, was inserted in 07/2016.  Well on Nexplanon with light menses and no pelvic pain.   OB History  Gravida Para Term Preterm AB Living  3 3 3     3   SAB TAB Ectopic Multiple Live Births          3    # Outcome Date GA Lbr Len/2nd Weight Sex Delivery Anes PTL Lv  3 Term     F Vag-Spont  N LIV  2 Term     F Vag-Spont  N LIV  1 Term     F Vag-Spont  N LIV    Past medical history,surgical history, problem list, medications, allergies, family history and social history were all reviewed and documented in the EPIC chart.   Directed ROS with pertinent positives and negatives documented in the history of present illness/assessment and plan.  Exam:  There were no vitals filed for this visit. General appearance:  Normal                                                             Nexplanon procedure note (removal)  The patient presented to the office today requesting for removal of her Nexplanon that was placed in the year 07/2016 on her right arm.   On examination the nexplanon implant was palpated and the distal end  (end  closest to the elbow) was marked. The area was sterilized with Betadine solution. 1% lidocaine was used for local anesthesia and approximately 1 cc  was injected into the site that was marked where the incision was to be made. The local anesthetic was injected under the implant in an effort to keep it  close to the skin surface. Slight pressure pushing downward was made at the proximal end  of the implant in an effort to stabilize it. A bulge appeared indicating the distal end of the implant. A small transverse incision of 2 mm was made at that location. By gently pushing the implant toward the incision, the tip became visible. Grasping the implant with a curved forcep facilitated in gently removing the  implant. Full confirmation of the entire implant which is 4 cm long was inspected and was intact and was shown to the patient and discarded. After removing the implant, the incision was closed with 2 Steri-Strips, a band-aid and a bandage. Patient will be instructed to remove the pressure bandage in 24 hours, the band-aid in 3 days and the Steri-Strips in 7 days.                                              Nexplanon Procedure Note (insertion)   The patient was laying on her back with her nondominant arm flexed at the elbow and externally rotated. The insertion site was identified as the underside of the nondominant upper arm approximately 8 cm from the medial epicondyle of the humerus.  A mark was made with a sterile marker at the spot where the  Nexplanon implant will be inserted above the right triceps. The area was cleansed with Betadine solution. The area was anesthetized with 1% lidocaine  (1 cc)  at the area the injection site and underneath the skin along the planned insertion tunnel. The preloaded disposable Nexplanon was removed from its sterile casing.  The applicator was held above the needle at the textured surface area. The transparent protector was removed. With a freehand, the skin was stretched around the insertion site with a thumb and index finger. The skin was then punctured with the tip of the needle angled at 30. The Nexplanon applicator was lowered to a horizontal position. While lifting the skin with the tip of the needle the needle was then slid to its full length. The applicator was kept in this position with the needle inserted to its full length. The purple slider was unlocked by pushing it slightly downward. The slider was fully moved back until it stopped. This allowed the implant to be in the final subdermal position and the needle to be locked inside the body of the applicator. The applicator was then removed. 1 Steri-Strip were applied over the incision, a band-aid and a bandage  was placed which the patient is to remove tomorrow. No complications, the patient tolerated procedure well and was released home with instructions.    Assessment/Plan:  [name]   1. Encounter for Nexplanon removal Easy removal of Nexplanon after 3 years.  No complication and well-tolerated.  2. Insertion of Nexplanon Easy insertion of Nexplanon in the right arm at a different location to be safely above the tricep.  No complication and well-tolerated.  Precautions reviewed.  Princess Bruins MD, 8:11 AM 07/03/2019

## 2019-07-04 ENCOUNTER — Ambulatory Visit (INDEPENDENT_AMBULATORY_CARE_PROVIDER_SITE_OTHER): Payer: BC Managed Care – PPO | Admitting: Psychology

## 2019-07-04 ENCOUNTER — Encounter: Payer: Self-pay | Admitting: Anesthesiology

## 2019-07-04 DIAGNOSIS — F411 Generalized anxiety disorder: Secondary | ICD-10-CM

## 2019-07-06 ENCOUNTER — Telehealth: Payer: Self-pay | Admitting: *Deleted

## 2019-07-06 NOTE — Telephone Encounter (Signed)
Pt recently had Nexplnon inserted 07/03/2019. Pt wants to know when she can start having intercourse?  You will need to abstain from sex or use a back-up method, like a condom, for 7 days after the procedure. This gives the implant time to start working properly

## 2019-07-08 ENCOUNTER — Encounter: Payer: Self-pay | Admitting: Obstetrics & Gynecology

## 2019-07-08 NOTE — Patient Instructions (Signed)
1. Encounter for Nexplanon removal Easy removal of Nexplanon after 3 years.  No complication and well-tolerated.  2. Insertion of Nexplanon Easy insertion of Nexplanon in the right arm at a different location to be safely above the tricep.  No complication and well-tolerated.  Precautions reviewed.  Megan Lang, it was a pleasure seeing you today!

## 2019-07-10 ENCOUNTER — Encounter: Payer: Self-pay | Admitting: Gynecology

## 2019-08-01 ENCOUNTER — Ambulatory Visit (INDEPENDENT_AMBULATORY_CARE_PROVIDER_SITE_OTHER): Payer: BC Managed Care – PPO | Admitting: Psychology

## 2019-08-01 DIAGNOSIS — F411 Generalized anxiety disorder: Secondary | ICD-10-CM

## 2019-08-02 ENCOUNTER — Ambulatory Visit: Payer: BC Managed Care – PPO | Admitting: Obstetrics & Gynecology

## 2019-08-29 ENCOUNTER — Ambulatory Visit (INDEPENDENT_AMBULATORY_CARE_PROVIDER_SITE_OTHER): Payer: BC Managed Care – PPO | Admitting: Psychology

## 2019-08-29 DIAGNOSIS — F411 Generalized anxiety disorder: Secondary | ICD-10-CM

## 2019-09-09 ENCOUNTER — Other Ambulatory Visit: Payer: Self-pay | Admitting: Emergency Medicine

## 2019-09-10 NOTE — Telephone Encounter (Signed)
Needs OV please.

## 2019-09-10 NOTE — Telephone Encounter (Signed)
Requested medication (s) are due for refill today: yes  Requested medication (s) are on the active medication list: yes  Last refill:  12/22/2018  Future visit scheduled: no  Notes to clinic:  Review for refill Overdue for office visit    Requested Prescriptions  Pending Prescriptions Disp Refills   amLODipine (NORVASC) 5 MG tablet [Pharmacy Med Name: AMLODIPINE BESYLATE 5 MG TAB] 30 tablet 1    Sig: TAKE 1 TABLET BY MOUTH EVERY DAY     Cardiovascular:  Calcium Channel Blockers Failed - 09/09/2019 10:11 AM      Failed - Valid encounter within last 6 months    Recent Outpatient Visits          10 months ago Chronic left-sided low back pain with left-sided sciatica   [facility], Lilia Argue, MD   10 months ago Essential hypertension   Primary Care at [facility] Ines Bloomer, MD   1 year ago Anxiety state   Primary Care at [facility], Gelene Mink, PA-C   1 year ago Essential hypertension   Primary Care at [place], [place], MD   1 year ago BV (bacterial vaginosis)   Primary Care at [facility], [place], PA-C             Passed - Last BP in normal range    BP Readings from Last 1 Encounters:  07/03/19 136/88

## 2019-09-10 NOTE — Telephone Encounter (Signed)
Pt is not seen at our office. Please advise

## 2019-09-26 ENCOUNTER — Ambulatory Visit: Payer: BC Managed Care – PPO | Admitting: Psychology

## 2019-10-08 ENCOUNTER — Encounter: Payer: Self-pay | Admitting: Plastic Surgery

## 2019-10-08 ENCOUNTER — Ambulatory Visit (INDEPENDENT_AMBULATORY_CARE_PROVIDER_SITE_OTHER): Payer: BC Managed Care – PPO | Admitting: Plastic Surgery

## 2019-10-08 ENCOUNTER — Other Ambulatory Visit: Payer: Self-pay

## 2019-10-08 VITALS — BP 137/85 | HR 95 | Temp 98.5°F | Ht 63.0 in | Wt 193.8 lb

## 2019-10-08 DIAGNOSIS — M4802 Spinal stenosis, cervical region: Secondary | ICD-10-CM

## 2019-10-08 DIAGNOSIS — L910 Hypertrophic scar: Secondary | ICD-10-CM

## 2019-10-08 NOTE — Progress Notes (Signed)
Patient ID: Megan Lang, female    DOB: 04/28/1971, 48 y.o.   MRN: [id number]   Chief Complaint  Patient presents with  . Advice Only    for scar revision on neck cervical  . Skin Problem    The patient is a 48 year old female here for evaluation of a scar on her neck.  In November 2019 she underwent cervical surgery with Dr. Rolena Infante.  She now has a 1 x 3 cm keloid on the front of her neck.  There is another small keloid more midline 1 cm in size.  She states that it is a little painful and very itchy.  She tried silicone sheets but that did not seem to make a difference.  The small keloid is from the drain site.  She is 5 feet 3 inches tall weighs 193 pounds.  She has not had any other surgical interval mention on these areas.  She did have a left wrist surgery and did not get a keloid from that.  She is not aware of keloids in the family.  She has multiple medical conditions as listed below.  The area is firm and does appear to be drawing in on her skin at the periwound area.   Review of Systems  Constitutional: Negative for activity change and appetite change.  Eyes: Negative.   Respiratory: Negative.  Negative for chest tightness and shortness of breath.   Cardiovascular: Negative.   Gastrointestinal: Negative.   Endocrine: Negative.   Genitourinary: Negative.   Musculoskeletal: Positive for back pain.  Skin: Positive for color change.  Psychiatric/Behavioral: Negative.     Past Medical History:  Diagnosis Date  . Acid reflux   . Allergy   . Anxiety   . Aortic aneurysm ([place])   . Arthritis   . Asthma   . Cardiomegaly   . Carpal tunnel syndrome   . Depression   . Gastric polyp   . GERD (gastroesophageal reflux disease) 01/23/2016  . Hiatal hernia   . Hypertension   . Normal spontaneous vaginal delivery    3    Past Surgical History:  Procedure Laterality Date  . ANTERIOR CERVICAL DECOMPRESSION/DISCECTOMY FUSION 4 LEVELS N/A 08/31/2018   Procedure: ANTERIOR  CERVICAL DECOMPRESSION/DISCECTOMY FUSION C3-7;  Surgeon: Melina Schools, MD;  Location: [place];  Service: Orthopedics;  Laterality: N/A;  6 hrs  . CARPAL TUNNEL RELEASE  2009  . CARPAL TUNNEL RELEASE Left   . COLONOSCOPY    . NASAL SINUS SURGERY     DEC 15,2016  . ROTATOR CUFF REPAIR Left 04/14/2018      Current Outpatient Medications:  .  albuterol (PROVENTIL HFA;VENTOLIN HFA) 108 (90 Base) MCG/ACT inhaler, Inhale 1-2 puffs into the lungs every 6 (six) hours as needed for wheezing or shortness of breath., Disp: 1 Inhaler, Rfl: 0 .  ALPRAZolam (XANAX) 0.25 MG tablet, Take 1-2 tablets (0.25-0.5 mg total) by mouth at bedtime as needed for anxiety or sleep., Disp: 40 tablet, Rfl: 1 .  amLODipine (NORVASC) 5 MG tablet, TAKE 1 TABLET BY MOUTH ONCE DAILY, Disp: 90 tablet, Rfl: 0 .  azelastine (ASTELIN) 0.1 % nasal spray, Place 2 sprays into both nostrils 2 (two) times daily. Use in each nostril as directed, Disp: 30 mL, Rfl: 0 .  bisoprolol (ZEBETA) 5 MG tablet, Take 5 mg by mouth daily., Disp: , Rfl:  .  bisoprolol-hydrochlorothiazide (ZIAC) 5-6.25 MG tablet, Take 1 tablet by mouth daily., Disp: 90 tablet, Rfl: 1 .  busPIRone (  BUSPAR) 15 MG tablet, Take 1 tablet (15 mg total) by mouth 2 (two) times daily. (Patient taking differently: Take 30 mg by mouth daily. ), Disp: 60 tablet, Rfl: 0 .  escitalopram (LEXAPRO) 10 MG tablet, Take 2 tablets (20 mg total) by mouth daily., Disp: 60 tablet, Rfl: 2 .  etonogestrel (NEXPLANON) 68 MG IMPL implant, 1 each by Subdermal route once., Disp: , Rfl:  .  sodium chloride (OCEAN) 0.65 % SOLN nasal spray, Place 1 spray into both nostrils daily as needed for congestion. , Disp: , Rfl:  .  traZODone (DESYREL) 50 MG tablet, Take 2 tablets (100 mg total) by mouth at bedtime., Disp: 60 tablet, Rfl: 0   Objective:   Vitals:   10/08/19 0846  BP: 137/85  Pulse: 95  Temp: 98.5 F (36.9 C)  SpO2: 99%    Physical Exam Vitals and nursing note reviewed.    Constitutional:      Appearance: Normal appearance.  HENT:     Head: Normocephalic and atraumatic.  Neck:   Cardiovascular:     Rate and Rhythm: Normal rate.  Pulmonary:     Effort: Pulmonary effort is normal.  Abdominal:     General: There is no distension.  Skin:    General: Skin is warm.     Capillary Refill: Capillary refill takes less than 2 seconds.  Neurological:     General: No focal deficit present.     Mental Status: She is alert and oriented to person, place, and time.  Psychiatric:        Mood and Affect: Mood normal.        Behavior: Behavior normal.        Thought Content: Thought content normal.     Assessment & Plan:  Spinal stenosis in cervical region  Keloid of skin  I recommend a multipronged approach as discussed with the patient.  This includes massage, silicone sheets, but derma and Kenalog injections.  I discussed some of the complications with Kenalog injections which include hypopigmentation and atrophy of the underlying soft tissue.  The patient would like to try the Kenalog.  We will see if we can get this set up to at least stop the progression of the keloid from getting worse. Pictures were obtained of the patient and placed in the chart with the patient's or guardian's permission.  The [place] was signed into law in 2016 which includes the topic of electronic health records.  This provides immediate access to information in MyChart.  This includes consultation notes, operative notes, office notes, lab results and pathology reports.  If you have any questions about what you read please let us know at your next visit or call us at the office.  We are right here with you.    [place], DO

## 2019-10-29 ENCOUNTER — Other Ambulatory Visit: Payer: Self-pay | Admitting: Emergency Medicine

## 2019-10-29 DIAGNOSIS — I10 Essential (primary) hypertension: Secondary | ICD-10-CM

## 2019-10-29 NOTE — Telephone Encounter (Signed)
Please schedule patient an appt for f/u for medication refill

## 2019-10-29 NOTE — Telephone Encounter (Signed)
Requested medication (s) are due for refill today: yes  Requested medication (s) are on the active medication list: yes  Last refill: 10/15/2019  Future visit scheduled: no  Notes to clinic:  no valid encounter in last 6 months    Requested Prescriptions  Pending Prescriptions Disp Refills   amLODipine (NORVASC) 5 MG tablet [Pharmacy Med Name: AMLODIPINE BESYLATE 5 MG TAB] 30 tablet 1    Sig: TAKE 1 TABLET BY MOUTH EVERY DAY      Cardiovascular:  Calcium Channel Blockers Failed - 10/29/2019 11:34 AM      Failed - Valid encounter within last 6 months    Recent Outpatient Visits           11 months ago Chronic left-sided low back pain with left-sided sciatica   [facility], Lilia Argue, MD   11 months ago Essential hypertension   Primary Care at [place], Ines Bloomer, MD   1 year ago Anxiety state   Primary Care at [facility], Gelene Mink, PA-C   1 year ago Essential hypertension   Primary Care at [place], [place], MD   1 year ago BV (bacterial vaginosis)   Primary Care at [facility], [place], PA-C              Passed - Last BP in normal range    BP Readings from Last 1 Encounters:  10/08/19 137/85

## 2019-11-06 ENCOUNTER — Other Ambulatory Visit: Payer: Self-pay

## 2019-11-06 DIAGNOSIS — I10 Essential (primary) hypertension: Secondary | ICD-10-CM

## 2019-11-06 MED ORDER — AMLODIPINE BESYLATE 5 MG PO TABS: 5.0000 mg | ORAL_TABLET | Freq: Every day | ORAL | 0 refills | Status: DC

## 2019-11-13 ENCOUNTER — Ambulatory Visit: Payer: BC Managed Care – PPO | Admitting: Plastic Surgery

## 2019-11-19 ENCOUNTER — Other Ambulatory Visit: Payer: Self-pay | Admitting: Obstetrics & Gynecology

## 2019-11-19 DIAGNOSIS — Z1231 Encounter for screening mammogram for malignant neoplasm of breast: Secondary | ICD-10-CM

## 2019-11-22 ENCOUNTER — Ambulatory Visit: Payer: BC Managed Care – PPO | Admitting: Family Medicine

## 2019-12-03 ENCOUNTER — Ambulatory Visit: Payer: BC Managed Care – PPO | Admitting: Emergency Medicine

## 2019-12-04 ENCOUNTER — Ambulatory Visit (INDEPENDENT_AMBULATORY_CARE_PROVIDER_SITE_OTHER): Payer: BC Managed Care – PPO | Admitting: Psychology

## 2019-12-04 DIAGNOSIS — F411 Generalized anxiety disorder: Secondary | ICD-10-CM

## 2019-12-06 ENCOUNTER — Encounter: Payer: BC Managed Care – PPO | Admitting: Obstetrics & Gynecology

## 2019-12-18 ENCOUNTER — Ambulatory Visit: Payer: BC Managed Care – PPO | Admitting: Plastic Surgery

## 2019-12-31 ENCOUNTER — Other Ambulatory Visit: Payer: Self-pay

## 2020-01-01 ENCOUNTER — Ambulatory Visit (INDEPENDENT_AMBULATORY_CARE_PROVIDER_SITE_OTHER): Payer: BC Managed Care – PPO | Admitting: Psychology

## 2020-01-01 ENCOUNTER — Ambulatory Visit (INDEPENDENT_AMBULATORY_CARE_PROVIDER_SITE_OTHER): Payer: BC Managed Care – PPO | Admitting: Obstetrics & Gynecology

## 2020-01-01 ENCOUNTER — Encounter: Payer: Self-pay | Admitting: Obstetrics & Gynecology

## 2020-01-01 VITALS — BP 142/88 | Ht 62.0 in | Wt 191.0 lb

## 2020-01-01 DIAGNOSIS — E6609 Other obesity due to excess calories: Secondary | ICD-10-CM

## 2020-01-01 DIAGNOSIS — B373 Candidiasis of vulva and vagina: Secondary | ICD-10-CM | POA: Diagnosis not present

## 2020-01-01 DIAGNOSIS — F411 Generalized anxiety disorder: Secondary | ICD-10-CM | POA: Diagnosis not present

## 2020-01-01 DIAGNOSIS — Z3046 Encounter for surveillance of implantable subdermal contraceptive: Secondary | ICD-10-CM

## 2020-01-01 DIAGNOSIS — Z1151 Encounter for screening for human papillomavirus (HPV): Secondary | ICD-10-CM

## 2020-01-01 DIAGNOSIS — B3731 Acute candidiasis of vulva and vagina: Secondary | ICD-10-CM

## 2020-01-01 DIAGNOSIS — Z6834 Body mass index (BMI) 34.0-34.9, adult: Secondary | ICD-10-CM

## 2020-01-01 DIAGNOSIS — Z01419 Encounter for gynecological examination (general) (routine) without abnormal findings: Secondary | ICD-10-CM | POA: Diagnosis not present

## 2020-01-01 DIAGNOSIS — Z113 Encounter for screening for infections with a predominantly sexual mode of transmission: Secondary | ICD-10-CM | POA: Diagnosis not present

## 2020-01-01 MED ORDER — TERCONAZOLE 0.8 % VA CREA: 1.0000 | TOPICAL_CREAM | Freq: Every day | VAGINAL | 3 refills | Status: AC

## 2020-01-01 NOTE — Addendum Note (Signed)
Addended by: Thurnell Garbe A on: 01/01/2020 02:58 PM   Modules accepted: Orders

## 2020-01-01 NOTE — Progress Notes (Signed)
Megan Lang [birth year] [id number]   History:    50 y.o. G3P3L3 Married.  3 daughters 25-27-29  RP:  Established patient presenting for annual gyn exam   HPI: Well on Nexplanon since 06/2019.  Occasional light menses.  No pelvic pain currently, occasional Rt pelvic discomfort.  No pain with intercourse.  Thick vaginal secretions, no itching, no odor.  Breasts normal.  Body mass index 34.93.  Walks regularly.  Health labs recently done with family physician.  Past medical history,surgical history, family history and social history were all reviewed and documented in the EPIC chart.  Gynecologic History No LMP recorded. Patient has had an implant.  Obstetric History OB History  Gravida Para Term Preterm AB Living  3 3 3     3   SAB TAB Ectopic Multiple Live Births          3    # Outcome Date GA Lbr Len/2nd Weight Sex Delivery Anes PTL Lv  3 Term     F Vag-Spont  N LIV  2 Term     F Vag-Spont  N LIV  1 Term     F Vag-Spont  N LIV     ROS: A ROS was performed and pertinent positives and negatives are included in the history.  GENERAL: No fevers or chills. HEENT: No change in vision, no earache, sore throat or sinus congestion. NECK: No pain or stiffness. CARDIOVASCULAR: No chest pain or pressure. No palpitations. PULMONARY: No shortness of breath, cough or wheeze. GASTROINTESTINAL: No abdominal pain, nausea, vomiting or diarrhea, melena or bright red blood per rectum. GENITOURINARY: No urinary frequency, urgency, hesitancy or dysuria. MUSCULOSKELETAL: No joint or muscle pain, no back pain, no recent trauma. DERMATOLOGIC: No rash, no itching, no lesions. ENDOCRINE: No polyuria, polydipsia, no heat or cold intolerance. No recent change in weight. HEMATOLOGICAL: No anemia or easy bruising or bleeding. NEUROLOGIC: No headache, seizures, numbness, tingling or weakness. PSYCHIATRIC: No depression, no loss of interest in normal activity or change in sleep pattern.     Exam:   BP (!)  142/88   Ht 5\' 2"  (1.575 m)   Wt 191 lb (86.6 kg)   BMI 34.93 kg/m   Body mass index is 34.93 kg/m.  General appearance : Well developed well nourished female. No acute distress HEENT: Eyes: no retinal hemorrhage or exudates,  Neck supple, trachea midline, no carotid bruits, no thyroidmegaly Lungs: Clear to auscultation, no rhonchi or wheezes, or rib retractions  Heart: Regular rate and rhythm, no murmurs or gallops Breast:Examined in sitting and supine position were symmetrical in appearance, no palpable masses or tenderness,  no skin retraction, no nipple inversion, no nipple discharge, no skin discoloration, no axillary or supraclavicular lymphadenopathy Abdomen: no palpable masses or tenderness, no rebound or guarding Extremities: no edema or skin discoloration or tenderness  Pelvic: Vulva: Normal             Vagina: No gross lesions.  Thick yeasty vaginal discharge  Cervix: No gross lesions or discharge.  Pap/HPV HR, Gono-Chlam  Uterus  AV, normal size, shape and consistency, non-tender and mobile  Adnexa  Without masses or tenderness  Anus: Normal   Assessment/Plan:  49 y.o. female for annual exam   1. Encounter for routine gynecological examination with Papanicolaou smear of cervix Normal gynecologic exam.  Pap test with high-risk HPV done today.  Breast exam normal.  Screening mammogram scheduled for next week.  Health labs with family physician.  2. Encounter for  surveillance of implantable subdermal contraceptive Well on Nexplanon since September 2020.  3. Yeast vaginitis Yeast vaginitis per symptoms and findings on exam.  Will treat with Terazol 3.  Usage reviewed and prescription sent to pharmacy.  4. Screen for STD (sexually transmitted disease) Gonorrhea and Chlamydia done on Pap test.  5. Class 1 obesity due to excess calories without serious comorbidity with body mass index (BMI) of 34.0 to 34.9 in adult Recommend a lower calorie/carb diet such as [employer]	[place].  Aerobic physical activities 5 times a week and light weightlifting every 2 days.  Other orders - terconazole (TERAZOL 3) 0.8 % vaginal cream; Place 1 applicator vaginally at bedtime for 3 days.  Princess Bruins MD, 1:47 PM 01/01/2020

## 2020-01-01 NOTE — Patient Instructions (Addendum)
1. Encounter for routine gynecological examination with Papanicolaou smear of cervix Normal gynecologic exam.  Pap test with high-risk HPV done today.  Breast exam normal.  Screening mammogram scheduled for next week.  Health labs with family physician.  2. Encounter for surveillance of implantable subdermal contraceptive Well on Nexplanon since September 2020.  3. Yeast vaginitis Yeast vaginitis per symptoms and findings on exam.  Will treat with Terazol 3.  Usage reviewed and prescription sent to pharmacy.  4. Screen for STD (sexually transmitted disease) Gonorrhea and Chlamydia done on Pap test.  5. Class 1 obesity due to excess calories without serious comorbidity with body mass index (BMI) of 34.0 to 34.9 in adult Recommend a lower calorie/carb diet such as [employer].  Aerobic physical activities 5 times a week and light weightlifting every 2 days.  Other orders - terconazole (TERAZOL 3) 0.8 % vaginal cream; Place 1 applicator vaginally at bedtime for 3 days.   Dene, it was a pleasure seeing you today!  I will inform you of your results as soon as they are available.

## 2020-01-03 LAB — PAP IG, CT-NG NAA, HPV HIGH-RISK
C. trachomatis RNA, TMA: NOT DETECTED
HPV DNA High Risk: NOT DETECTED
N. gonorrhoeae RNA, TMA: NOT DETECTED

## 2020-01-04 ENCOUNTER — Ambulatory Visit
Admission: RE | Admit: 2020-01-04 | Discharge: 2020-01-04 | Disposition: A | Discharge status: Home / Self Care | Payer: 59 | Source: Ambulatory Visit | Attending: Obstetrics & Gynecology | Admitting: Obstetrics & Gynecology

## 2020-01-04 ENCOUNTER — Other Ambulatory Visit: Payer: Self-pay

## 2020-01-04 DIAGNOSIS — Z1231 Encounter for screening mammogram for malignant neoplasm of breast: Secondary | ICD-10-CM

## 2020-01-22 ENCOUNTER — Ambulatory Visit: Payer: BC Managed Care – PPO | Admitting: Plastic Surgery

## 2020-01-29 ENCOUNTER — Ambulatory Visit: Payer: BC Managed Care – PPO | Admitting: Psychology

## 2020-02-04 ENCOUNTER — Ambulatory Visit (INDEPENDENT_AMBULATORY_CARE_PROVIDER_SITE_OTHER): Payer: BC Managed Care – PPO | Admitting: Psychology

## 2020-02-04 DIAGNOSIS — F411 Generalized anxiety disorder: Secondary | ICD-10-CM | POA: Diagnosis not present

## 2020-03-03 ENCOUNTER — Ambulatory Visit (INDEPENDENT_AMBULATORY_CARE_PROVIDER_SITE_OTHER): Payer: BC Managed Care – PPO | Admitting: Psychology

## 2020-03-03 DIAGNOSIS — F411 Generalized anxiety disorder: Secondary | ICD-10-CM

## 2020-03-04 ENCOUNTER — Ambulatory Visit: Payer: BC Managed Care – PPO | Admitting: Plastic Surgery

## 2020-03-25 ENCOUNTER — Other Ambulatory Visit: Payer: Self-pay

## 2020-03-26 ENCOUNTER — Encounter: Payer: Self-pay | Admitting: Obstetrics & Gynecology

## 2020-03-26 ENCOUNTER — Ambulatory Visit: Payer: BC Managed Care – PPO | Admitting: Obstetrics & Gynecology

## 2020-03-26 VITALS — BP 132/84

## 2020-03-26 DIAGNOSIS — N898 Other specified noninflammatory disorders of vagina: Secondary | ICD-10-CM | POA: Diagnosis not present

## 2020-03-26 DIAGNOSIS — B9689 Other specified bacterial agents as the cause of diseases classified elsewhere: Secondary | ICD-10-CM

## 2020-03-26 DIAGNOSIS — N760 Acute vaginitis: Secondary | ICD-10-CM

## 2020-03-26 LAB — WET PREP FOR TRICH, YEAST, CLUE

## 2020-03-26 MED ORDER — TINIDAZOLE 500 MG PO TABS: 1.0000 g | ORAL_TABLET | Freq: Two times a day (BID) | ORAL | 1 refills | Status: AC

## 2020-03-26 NOTE — Patient Instructions (Signed)
1. Vaginal discharge Bacterial vaginosis confirmed by wet prep.  Diagnosis and management reviewed with patient.  Will treat with tinidazole.  Usage reviewed with patient and prescription sent to pharmacy.  Recommend using boric acid once a week for 1 months and then monthly for prevention. - WET PREP FOR TRICH, YEAST, CLUE  2. Bacterial vaginosis Treat with Tinidazole.  Other orders - tinidazole (TINDAMAX) 500 MG tablet; Take 2 tablets (1,000 mg total) by mouth 2 (two) times daily for 2 days.  Dorinda, it was a pleasure seeing you today!

## 2020-03-26 NOTE — Progress Notes (Signed)
    Dilpreet Faires Leffler 1971/07/26 [id number]        49 y.o.  [id number] Married  RP: Vaginal discharge x 2 months  HPI: Increased vaginal discharge x 2 months.  Mildly improved after Monistat OTC.  Treated with Terconazol mid-March 2021 for yeast vaginitis which helped, but then her vaginal d/c increased again.  No itching or odor.  Declines STI screen.  No pelvic pain.  Well on Nexplanon.  No UTI Sx.  BMs normal.   OB History  Gravida Para Term Preterm AB Living  3 3 3     3   SAB TAB Ectopic Multiple Live Births          3    # Outcome Date GA Lbr Len/2nd Weight Sex Delivery Anes PTL Lv  3 Term     F Vag-Spont  N LIV  2 Term     F Vag-Spont  N LIV  1 Term     F Vag-Spont  N LIV    Past medical history,surgical history, problem list, medications, allergies, family history and social history were all reviewed and documented in the EPIC chart.   Directed ROS with pertinent positives and negatives documented in the history of present illness/assessment and plan.  Exam:  Vitals:   03/26/20 0907  BP: 132/84   General appearance:  Normal  Gynecologic exam: Vulva normal.  Speculum:  Cervix/Vagina normal.  Increased vaginal d/c.  Wet prep done.  Wet prep:  Clue cells present   Assessment/Plan:  49 y.o. G3P3003   1. Vaginal discharge Bacterial vaginosis confirmed by wet prep.  Diagnosis and management reviewed with patient.  Will treat with tinidazole.  Usage reviewed with patient and prescription sent to pharmacy.  Recommend using boric acid once a week for 1 months and then monthly for prevention. - WET PREP FOR TRICH, YEAST, CLUE  2. Bacterial vaginosis Treat with Tinidazole.  Other orders - tinidazole (TINDAMAX) 500 MG tablet; Take 2 tablets (1,000 mg total) by mouth 2 (two) times daily for 2 days.  Princess Bruins MD, 9:20 AM 03/26/2020

## 2020-04-02 ENCOUNTER — Ambulatory Visit (INDEPENDENT_AMBULATORY_CARE_PROVIDER_SITE_OTHER): Payer: BC Managed Care – PPO | Admitting: Psychology

## 2020-04-02 DIAGNOSIS — F411 Generalized anxiety disorder: Secondary | ICD-10-CM | POA: Diagnosis not present

## 2020-04-15 ENCOUNTER — Ambulatory Visit (INDEPENDENT_AMBULATORY_CARE_PROVIDER_SITE_OTHER): Payer: BC Managed Care – PPO | Admitting: Plastic Surgery

## 2020-04-15 ENCOUNTER — Encounter: Payer: Self-pay | Admitting: Plastic Surgery

## 2020-04-15 ENCOUNTER — Other Ambulatory Visit: Payer: Self-pay

## 2020-04-15 VITALS — BP 162/99 | HR 91 | Temp 98.4°F

## 2020-04-15 DIAGNOSIS — L910 Hypertrophic scar: Secondary | ICD-10-CM

## 2020-04-15 NOTE — Progress Notes (Signed)
Procedure Note  Preoperative Dx: Keloid of neck after cervical surgery  Postoperative Dx: Same  Procedure: Kenalog injection to 1 x 3 cm keloid of neck  Anesthesia: Lidocaine 1% with 1:100,000 epinepherine   Description of Procedure: Risks and complications were explained to the patient.  Consent was confirmed and the patient understands the risks and benefits.  The potential complications and alternatives were explained and the patient consents.  The patient expressed understanding the option of not having the procedure and the risks of a scar.  Time out was called and all information was confirmed to be correct.    The area was prepped and drapped.  Lidocaine 1% with epinepherine 0.1 cc and Kenalog 10 mg 0.1 cc was mixed.  The 1 x 3 cm keloid of the neck was injected with a mixture.  The patient tolerated the procedure well.  A dressing was applied.  The patient was given instructions on how to care for the area and a follow up appointment.  There were no complications.

## 2020-04-30 ENCOUNTER — Ambulatory Visit (INDEPENDENT_AMBULATORY_CARE_PROVIDER_SITE_OTHER): Payer: BC Managed Care – PPO | Admitting: Psychology

## 2020-04-30 DIAGNOSIS — F411 Generalized anxiety disorder: Secondary | ICD-10-CM

## 2020-05-28 ENCOUNTER — Ambulatory Visit (INDEPENDENT_AMBULATORY_CARE_PROVIDER_SITE_OTHER): Payer: BC Managed Care – PPO | Admitting: Psychology

## 2020-05-28 DIAGNOSIS — F411 Generalized anxiety disorder: Secondary | ICD-10-CM

## 2020-06-20 ENCOUNTER — Encounter: Payer: Self-pay | Admitting: Plastic Surgery

## 2020-06-20 ENCOUNTER — Ambulatory Visit (INDEPENDENT_AMBULATORY_CARE_PROVIDER_SITE_OTHER): Payer: BC Managed Care – PPO | Admitting: Plastic Surgery

## 2020-06-20 ENCOUNTER — Other Ambulatory Visit: Payer: Self-pay

## 2020-06-20 VITALS — BP 160/92 | HR 80 | Temp 98.3°F

## 2020-06-20 DIAGNOSIS — L910 Hypertrophic scar: Secondary | ICD-10-CM | POA: Diagnosis not present

## 2020-06-20 NOTE — Progress Notes (Signed)
Procedure Note  Preoperative Dx: Keloid of neck  Postoperative Dx: Same  Procedure: Kenalog injection to neck keloid 1 x 3 cm  Description of Procedure: Risks and complications were explained to the patient.  Consent was confirmed and the patient understands the risks and benefits.  The potential complications and alternatives were explained and the patient consents.  The patient expressed understanding the option of not having the procedure and the risks of a scar.    The area was prepped and drapped.  Lidocaine 1% with epinepherine 0.1 cc was mixed with Kenalog 50 mg per 5 cc 0.2 cc.  The mixture was injected directly into the keloid of the left neck.   The patient was given instructions on how to care for the area and a follow up appointment.  Sheily tolerated the procedure well and there were no complications.  This was her second injection to this keloid.  We will see her back in 2 months for reevaluation.

## 2020-06-25 ENCOUNTER — Ambulatory Visit (INDEPENDENT_AMBULATORY_CARE_PROVIDER_SITE_OTHER): Payer: BC Managed Care – PPO | Admitting: Psychology

## 2020-06-25 DIAGNOSIS — F411 Generalized anxiety disorder: Secondary | ICD-10-CM

## 2020-07-23 ENCOUNTER — Ambulatory Visit (INDEPENDENT_AMBULATORY_CARE_PROVIDER_SITE_OTHER): Payer: BC Managed Care – PPO | Admitting: Psychology

## 2020-07-23 DIAGNOSIS — F411 Generalized anxiety disorder: Secondary | ICD-10-CM | POA: Diagnosis not present

## 2020-07-31 ENCOUNTER — Other Ambulatory Visit: Payer: Self-pay | Admitting: *Deleted

## 2020-07-31 DIAGNOSIS — I712 Thoracic aortic aneurysm, without rupture, unspecified: Secondary | ICD-10-CM

## 2020-08-20 ENCOUNTER — Ambulatory Visit: Payer: BC Managed Care – PPO | Admitting: Psychology

## 2020-08-27 ENCOUNTER — Other Ambulatory Visit: Payer: Self-pay

## 2020-08-27 ENCOUNTER — Ambulatory Visit: Payer: BC Managed Care – PPO | Admitting: Cardiothoracic Surgery

## 2020-08-27 ENCOUNTER — Ambulatory Visit
Admission: RE | Admit: 2020-08-27 | Discharge: 2020-08-27 | Disposition: A | Discharge status: Home / Self Care | Payer: BC Managed Care – PPO | Source: Ambulatory Visit | Attending: Cardiothoracic Surgery | Admitting: Cardiothoracic Surgery

## 2020-08-27 ENCOUNTER — Encounter: Payer: Self-pay | Admitting: Cardiothoracic Surgery

## 2020-08-27 VITALS — BP 160/90 | HR 86 | Resp 20 | Ht 62.0 in | Wt 190.0 lb

## 2020-08-27 DIAGNOSIS — I712 Thoracic aortic aneurysm, without rupture, unspecified: Secondary | ICD-10-CM

## 2020-08-27 DIAGNOSIS — M314 Aortic arch syndrome [Takayasu]: Secondary | ICD-10-CM | POA: Diagnosis not present

## 2020-08-27 MED ORDER — IOPAMIDOL (ISOVUE-370) INJECTION 76%
75.0000 mL | Freq: Once | INTRAVENOUS | Status: AC | PRN
  Administered 2020-08-27: 75 mL via INTRAVENOUS

## 2020-08-27 NOTE — Progress Notes (Signed)
PCP is Julian Hy, PA-C Referring Provider is Venora Maples, MD  Chief Complaint  Patient presents with  . 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  The patient has been compliant with her blood pressure meds including Ziac and Norvasc.  Review of her medical record shows her blood pressure readings have ranged between 130 and 140 over the past year.  CTA she is a non-smoker.  She has lost 8 pounds in the past 6 months, BMI 34.  She denies any symptoms of back pain she has chronic neck pain and is status post a surgical fusion.  An echocardiogram performed Showed a normal tricuspid aortic valve, normal LV function.  I reviewed her images personally today which demonstrate a stable 4.1 cm fusiform ascending aneurysm without mural thickening or ulceration.  No at risk pulmonary nodules or adenopathy.  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  Past Medical History:  Diagnosis Date  . Acid reflux   . Allergy   . Anxiety   . Aortic aneurysm ([place])   . Arthritis   . Asthma   . Cardiomegaly   . Carpal tunnel syndrome   . Depression   . Gastric polyp   . GERD (gastroesophageal reflux disease) 01/23/2016  . Hiatal hernia   . Hypertension   . Normal spontaneous vaginal delivery    3    Past Surgical History:  Procedure Laterality Date  . ANTERIOR CERVICAL DECOMPRESSION/DISCECTOMY FUSION 4 LEVELS N/A 08/31/2018   Procedure: ANTERIOR CERVICAL DECOMPRESSION/DISCECTOMY FUSION C3-7;  Surgeon: Melina Schools, MD;  Location: [place];  Service: Orthopedics;  Laterality: N/A;  6 hrs  . CARPAL TUNNEL RELEASE  2009  . CARPAL TUNNEL RELEASE Left   . COLONOSCOPY    . NASAL SINUS SURGERY     DEC 15,2016  . ROTATOR CUFF REPAIR Left 04/14/2018    Family History  Problem Relation Age of Onset   . Hypertension Mother   . Kidney disease Father   . Liver disease Father        Unknown diagnosis  . Breast cancer Maternal Grandmother   . Hypertension Sister   . Hypertension Brother   . Drug abuse Brother     Social History Social History   Tobacco Use  . Smoking status: Never Smoker  . Smokeless tobacco: Never Used  Vaping Use  . Vaping Use: Never used  Substance Use Topics  . Alcohol use: No    Alcohol/week: 0.0 standard drinks  . Drug use: No    Current Outpatient Medications  Medication Sig Dispense Refill  . albuterol (PROVENTIL HFA;VENTOLIN HFA) 108 (90 Base) MCG/ACT inhaler Inhale 1-2 puffs into the lungs every 6 (six) hours as needed for wheezing or shortness of breath. 1 Inhaler 0  . ALPRAZolam (XANAX) 0.25 MG tablet Take 1-2 tablets (0.25-0.5 mg total) by mouth at bedtime as needed for anxiety or sleep. 40 tablet 1  . amLODipine (NORVASC) 5 MG tablet Take 1 tablet (5 mg total) by mouth daily. 30 tablet 0  . azelastine (ASTELIN) 0.1 % nasal spray Place 2 sprays into both nostrils 2 (two) times daily. Use in each nostril as directed 30 mL 0  . bisoprolol-hydrochlorothiazide (ZIAC) 5-6.25 MG tablet Take 1 tablet by mouth daily. 90 tablet 1  . busPIRone (BUSPAR) 15 MG tablet  Take 1 tablet (15 mg total) by mouth 2 (two) times daily. (Patient taking differently: Take 30 mg by mouth daily. ) 60 tablet 0  . etonogestrel (NEXPLANON) 68 MG IMPL implant 1 each by Subdermal route once.    . sodium chloride (OCEAN) 0.65 % SOLN nasal spray Place 1 spray into both nostrils daily as needed for congestion.      No current facility-administered medications for this visit.    Allergies  Allergen Reactions  . Moxifloxacin Swelling    other  . Clindamycin Diarrhea and Other (See Comments)    GI upset  . Codeine Other (See Comments)    TACHYCARDIA   . Levofloxacin Other (See Comments)    other  . Metronidazole Diarrhea  . Minocin [Minocycline Hcl] Other (See Comments)     Stomach Pains  . Penicillins Rash    Has patient had a PCN reaction causing immediate rash, facial/tongue/throat swelling, SOB or lightheadedness with hypotension:yes Has patient had a PCN reaction causing severe rash involving mucus membranes or skin necrosis:no Has patient had a PCN reaction that required hospitalization:no Has patient had a PCN reaction occurring within the last 10 years:No If all of the above answers are "NO", then may proceed with Cephalosporin    Review of Systems  Complains of stress at work Relates symptoms of pain in her neck related to a keloid for the cervical confusion Relates that an injection of the keloid has reduced in size but has not resolved the scar. Denies shortness of breath ankle edema PND syncope or palpitations  BP (!) 160/90   Pulse 86   Resp 20   Ht 5\' 2"  (1.575 m)   Wt 190 lb (86.2 kg)   SpO2 98% Comment: RA  BMI 34.75 kg/m  Physical Exam      Exam    General- alert and comfortable    Neck- no JVD, no cervical adenopathy palpable, no carotid bruit   Lungs- clear without rales, wheezes   Cor- regular rate and rhythm, no murmur , gallop   Abdomen- soft, non-tender   Extremities - warm, non-tender, minimal edema   Neuro- oriented, appropriate, no focal weakness   Diagnostic Tests: CT images personally reviewed as noted above.  No change in the stable 4.1 cm fusiform ascending aneurysm.  Impression: Patient remains at very low risk for tear-dissection.  She was reassured that if she keeps her blood pressure under control her aortic dilatation should remain stable, at low risk, not need surgery.  She understands the goal is to keep her systolic blood pressure less than 140.  Plan: In order to reduce the long-term exposure to radiation we will continue to do the surveillance scans every 18 months.  She will let us know if she develops any symptoms of upper back pain or substernal pain.   Len Childs, MD [facility] [phone]

## 2020-08-29 ENCOUNTER — Ambulatory Visit: Payer: BC Managed Care – PPO | Admitting: Psychology

## 2020-09-08 ENCOUNTER — Ambulatory Visit (INDEPENDENT_AMBULATORY_CARE_PROVIDER_SITE_OTHER): Payer: BC Managed Care – PPO | Admitting: Psychology

## 2020-09-08 DIAGNOSIS — F411 Generalized anxiety disorder: Secondary | ICD-10-CM

## 2020-09-23 ENCOUNTER — Ambulatory Visit (INDEPENDENT_AMBULATORY_CARE_PROVIDER_SITE_OTHER): Payer: BC Managed Care – PPO | Admitting: Plastic Surgery

## 2020-09-23 ENCOUNTER — Encounter: Payer: Self-pay | Admitting: Plastic Surgery

## 2020-09-23 ENCOUNTER — Other Ambulatory Visit: Payer: Self-pay

## 2020-09-23 VITALS — BP 151/82 | HR 83 | Temp 98.4°F

## 2020-09-23 DIAGNOSIS — L910 Hypertrophic scar: Secondary | ICD-10-CM

## 2020-09-23 NOTE — Progress Notes (Signed)
Procedure Note  Preoperative Dx: Keloid of neck  Postoperative Dx: Same  Procedure: Kenalog injection to neck keloid 1 x 3 cm  Description of Procedure: Risks and complications were explained to the patient.  Consent was confirmed and the patient understands the risks and benefits.  The potential complications and alternatives were explained and the patient consents.  The patient expressed understanding the option of not having the procedure and the risks of a scar.  Time out was called and all information was confirmed to be correct.    The area was prepped and drapped.  Lidocaine 1% with epinepherine 0.1 cc was mixed with Kenalog 50 mg per 5 cc 0.3 cc.  The mixture was injected directly into the keloid of the 1 x 3 cm neck keloid.  A dressing was applied.  The patient was given instructions on how to care for the area and a follow up appointment.  Megan Lang tolerated the procedure well and there were no complications.

## 2020-10-06 ENCOUNTER — Ambulatory Visit: Payer: BC Managed Care – PPO | Admitting: Psychology

## 2020-10-13 ENCOUNTER — Ambulatory Visit: Payer: BC Managed Care – PPO | Admitting: Psychology

## 2020-10-23 ENCOUNTER — Ambulatory Visit (INDEPENDENT_AMBULATORY_CARE_PROVIDER_SITE_OTHER): Payer: BC Managed Care – PPO | Admitting: Psychology

## 2020-10-23 DIAGNOSIS — F411 Generalized anxiety disorder: Secondary | ICD-10-CM | POA: Diagnosis not present

## 2020-11-14 ENCOUNTER — Ambulatory Visit: Payer: Self-pay | Admitting: Nurse Practitioner

## 2020-11-17 NOTE — Progress Notes (Signed)
GYNECOLOGY  VISIT  CC:   Evaluation of discharge  HPI: 50 y.o. G14P3003 Married Megan Lang here for discharge.   Discharge has been different for her off/on for a couple months. Reports discharge looks like cottage cheese type discharge, no odor, no irritation, but is seems to be an increased amount than usual. Used Boric Acid suppositories from [facility] x 3 days and seems better. Never had any itching  Doing well with Nexplanon, sometimes has some breast tenderness. Bleeds about every other month. Had a period 11/06/20, had 7 days of moderate bleeding. Pain started towards the end of bleeding. Woke he up out of sleep. Now pain is gone. Lasted off/on x 3 days. This is the first period that caused her pain in a long time, second time since Nexplanon inserted (Sept 2020). Used Thermacare and it was helpful. Can not take NSAID r/t stomach issues. Has not tried any medication for pain.  Would like testing for gonorrhea and chlamydia, HIV and RPR testing.  Wonders if fibroids are growing. Fibroid identified in 2019 ultrasound, 2 submucosal fibroids 1.8 cm each. Last annual exam 12/2019, uterus documented as normal size and shape.  GYNECOLOGIC HISTORY: No LMP recorded. Patient has had an implant. Contraception: nexplanon inserted 07-03-2019 Menopausal hormone therapy: none  Patient Active Problem List   Diagnosis Date Noted  . 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 Keloid of skin 10/08/2019  . Cervical myelopathy ([place]) 08/31/2018  . Spinal stenosis in cervical region 08/29/2018  . Cervicalgia 08/29/2018  . Shoulder impingement syndrome, left 12/19/2017  . Scoliosis (and kyphoscoliosis), idiopathic 07/01/2017  . Chronic bilateral back pain 07/01/2017  . Nexplanon in place 07/29/2016  . Cardiomegaly 07/01/2016  . History of aortic aneurysm 07/01/2016  . GERD (gastroesophageal reflux disease) 01/23/2016  . Dyspnea 10/27/2015  . Costochondritis  08/26/2015  . Obesity 07/01/2015  . Upper airway cough syndrome 06/30/2015  . History of shingles 01/26/2015  . Family history of colon cancer 10/27/2011  . Essential hypertension 10/27/2011    Past Medical History:  Diagnosis Date  . Acid reflux   . Allergy   . Anxiety   . Aortic aneurysm ([place])   . Arthritis   . Asthma   . Cardiomegaly   . Carpal tunnel syndrome   . Depression   . Gastric polyp   . GERD (gastroesophageal reflux disease) 01/23/2016  . Hiatal hernia   . Hypertension   . Normal spontaneous vaginal delivery    3    Past Surgical History:  Procedure Laterality Date  . ANTERIOR CERVICAL DECOMPRESSION/DISCECTOMY FUSION 4 LEVELS N/A 08/31/2018   Procedure: ANTERIOR CERVICAL DECOMPRESSION/DISCECTOMY FUSION C3-7;  Surgeon: Melina Schools, MD;  Location: [place];  Service: Orthopedics;  Laterality: N/A;  6 hrs  . CARPAL TUNNEL RELEASE  2009  . CARPAL TUNNEL RELEASE Left   . COLONOSCOPY    . NASAL SINUS SURGERY     DEC 15,2016  . nexplanon insertion     inserted 07-03-2019  . ROTATOR CUFF REPAIR Left 04/14/2018    MEDS:   Current Outpatient Medications on File Prior to Visit  Medication Sig Dispense Refill  . albuterol (PROVENTIL HFA;VENTOLIN HFA) 108 (90 Base) MCG/ACT inhaler Inhale 1-2 puffs into the lungs every 6 (six) hours as needed for wheezing or shortness of breath. 1 Inhaler 0  . ALPRAZolam (XANAX) 0.25 MG tablet Take 1-2 tablets (0.25-0.5 mg total) by mouth at bedtime as needed for anxiety or sleep. 40 tablet 1  .  amLODipine (NORVASC) 5 MG tablet Take 1 tablet (5 mg total) by mouth daily. 30 tablet 0  . azelastine (ASTELIN) 0.1 % nasal spray Place 2 sprays into both nostrils 2 (two) times daily. Use in each nostril as directed 30 mL 0  . bisoprolol-hydrochlorothiazide (ZIAC) 5-6.25 MG tablet Take 1 tablet by mouth daily. 90 tablet 1  . busPIRone (BUSPAR) 15 MG tablet Take 1 tablet (15 mg total) by mouth 2 (two) times daily. (Patient taking differently: Take  30 mg by mouth daily.) 60 tablet 0  . cyclobenzaprine (FLEXERIL) 5 MG tablet Take by mouth.    . diclofenac (VOLTAREN) 50 MG EC tablet Take by mouth.    . etonogestrel (NEXPLANON) 68 MG IMPL implant 1 each by Subdermal route once.    Marland Kitchen FLUoxetine (PROZAC) 40 MG capsule Take by mouth every morning.    . lidocaine (LIDODERM) 5 % 1 patch daily.    . ondansetron (ZOFRAN-ODT) 4 MG disintegrating tablet ondansetron 4 mg disintegrating tablet    . sodium chloride (OCEAN) 0.65 % SOLN nasal spray Place 1 spray into both nostrils daily as needed for congestion.      No current facility-administered medications on file prior to visit.    ALLERGIES: Moxifloxacin, Clindamycin, Codeine, Levofloxacin, Metronidazole, Minocin [minocycline hcl], and Penicillins  Family History  Problem Relation Age of Onset  . Hypertension Mother   . Kidney disease Father   . Liver disease Father        Unknown diagnosis  . Breast cancer Maternal Grandmother   . Hypertension Sister   . Hypertension Brother   . Drug abuse Brother       Review of Systems  Constitutional: Negative.   HENT: Negative.   Eyes: Negative.   Respiratory: Negative.   Cardiovascular: Negative.   Gastrointestinal: Negative.   Endocrine: Negative.   Genitourinary:       Vaginal discharge & discomfort with fibroids  Musculoskeletal: Negative.   Skin: Negative.   Allergic/Immunologic: Negative.   Neurological: Negative.   Hematological: Negative.   Psychiatric/Behavioral: Negative.     PHYSICAL EXAMINATION:    BP 116/72   Pulse 68   Resp 16   Wt 185 lb (83.9 kg)   BMI 33.84 kg/m     General appearance: alert, cooperative, no acute distress  Pelvic: External genitalia:  no lesions              Urethra:  normal appearing urethra with no masses, tenderness or lesions              Bartholins and Skenes: normal                 Vagina: normal appearing vagina, creamy tan colored  discharge, moderate amount (??end of menses vs  medication)              Cervix: no cervical motion tenderness and no lesions              Bimanual Exam:  Uterus:  normal size, contour, position, consistency, mobility, non-tender              Adnexa: no mass, fullness, tenderness               Chaperone, Joy, CMA, was present for exam.  Assessment/Plan:   Vaginal discharge - Plan: WET PREP FOR TRICH, YEAST, CLUE  Pelvic pain - Plan: C. trachomatis/N. gonorrhoeae RNA  Screen for STD (sexually transmitted disease) - Plan: HIV Antibody (routine testing w rflx), RPR, C.  trachomatis/N. gonorrhoeae RNA  May continue Theramcare if pain returns/ can use Tylenol as needed for pain Advised Uterus did not appear to seem enlarged but if pain continues, an Ultrasound can be used to monitor growth of fibroids. Reassured that wet prep appears normal. Advised that discharge may look different to her r/t lingering period which can be common with Nexplanon (and fibroids) or possibly discharge appears different from medication. It is possible nothing was identified on wet mount because it is being masked by boric acid. Encouraged to finish the course of boric acid she started (continue use x 4 more days) 25 minutes of total time was spent for this patient encounter, including preparation, face-to-face counseling with the patient and coordination of care, and documentation of the encounter.

## 2020-11-18 ENCOUNTER — Ambulatory Visit (INDEPENDENT_AMBULATORY_CARE_PROVIDER_SITE_OTHER): Payer: BC Managed Care – PPO | Admitting: Nurse Practitioner

## 2020-11-18 ENCOUNTER — Other Ambulatory Visit: Payer: Self-pay

## 2020-11-18 ENCOUNTER — Encounter: Payer: Self-pay | Admitting: Nurse Practitioner

## 2020-11-18 VITALS — BP 116/72 | HR 68 | Resp 16 | Wt 185.0 lb

## 2020-11-18 DIAGNOSIS — Z113 Encounter for screening for infections with a predominantly sexual mode of transmission: Secondary | ICD-10-CM

## 2020-11-18 DIAGNOSIS — R102 Pelvic and perineal pain: Secondary | ICD-10-CM | POA: Diagnosis not present

## 2020-11-18 DIAGNOSIS — N898 Other specified noninflammatory disorders of vagina: Secondary | ICD-10-CM | POA: Diagnosis not present

## 2020-11-18 LAB — WET PREP FOR TRICH, YEAST, CLUE

## 2020-11-18 NOTE — Patient Instructions (Signed)
You were screened for Sexually transmitted infections today, but clinically everything looks normal. You may continue to use Boric acid for 4 more days if needed.  Please follow up in March for your annual exam and will re-discuss your pain and see how you are feeling.  Nice to meet you today

## 2020-11-19 LAB — HIV ANTIBODY (ROUTINE TESTING W REFLEX): HIV 1&2 Ab, 4th Generation: NONREACTIVE

## 2020-11-19 LAB — C. TRACHOMATIS/N. GONORRHOEAE RNA
C. trachomatis RNA, TMA: NOT DETECTED
N. gonorrhoeae RNA, TMA: NOT DETECTED

## 2020-11-19 LAB — RPR: RPR Ser Ql: NONREACTIVE

## 2020-11-20 ENCOUNTER — Ambulatory Visit (INDEPENDENT_AMBULATORY_CARE_PROVIDER_SITE_OTHER): Payer: BC Managed Care – PPO | Admitting: Psychology

## 2020-11-20 DIAGNOSIS — F411 Generalized anxiety disorder: Secondary | ICD-10-CM

## 2020-11-26 ENCOUNTER — Other Ambulatory Visit: Payer: Self-pay | Admitting: Obstetrics & Gynecology

## 2020-11-26 DIAGNOSIS — Z1231 Encounter for screening mammogram for malignant neoplasm of breast: Secondary | ICD-10-CM

## 2020-12-18 ENCOUNTER — Ambulatory Visit (INDEPENDENT_AMBULATORY_CARE_PROVIDER_SITE_OTHER): Payer: BC Managed Care – PPO | Admitting: Psychology

## 2020-12-18 DIAGNOSIS — F411 Generalized anxiety disorder: Secondary | ICD-10-CM | POA: Diagnosis not present

## 2021-01-01 ENCOUNTER — Ambulatory Visit (INDEPENDENT_AMBULATORY_CARE_PROVIDER_SITE_OTHER): Payer: BC Managed Care – PPO | Admitting: Obstetrics & Gynecology

## 2021-01-01 ENCOUNTER — Other Ambulatory Visit: Payer: Self-pay

## 2021-01-01 ENCOUNTER — Encounter: Payer: Self-pay | Admitting: Obstetrics & Gynecology

## 2021-01-01 VITALS — BP 140/90 | Ht 62.0 in | Wt 186.0 lb

## 2021-01-01 DIAGNOSIS — Z3046 Encounter for surveillance of implantable subdermal contraceptive: Secondary | ICD-10-CM | POA: Diagnosis not present

## 2021-01-01 DIAGNOSIS — Z01419 Encounter for gynecological examination (general) (routine) without abnormal findings: Secondary | ICD-10-CM

## 2021-01-01 DIAGNOSIS — Z6834 Body mass index (BMI) 34.0-34.9, adult: Secondary | ICD-10-CM | POA: Diagnosis not present

## 2021-01-01 DIAGNOSIS — E6609 Other obesity due to excess calories: Secondary | ICD-10-CM

## 2021-01-01 NOTE — Progress Notes (Signed)
Megan Lang 21-May-1971 [id number]   History:    50 y.o. G3P3L3 Married. 3 daughters 26-28-30.  Has grand-children.  KG:[id number] presenting for annual gyn exam   QVZ:DGLO on Nexplanon since 06/2019. Occasional light menses. No pelvic pain. No pain with intercourse. Thick vaginal secretions, no itching, no odor, unchanged.  Breasts normal. Body mass index 34.02. Walks regularly. Health labs with family physician.  Past medical history,surgical history, family history and social history were all reviewed and documented in the EPIC chart.  Gynecologic History No LMP recorded. Patient has had an implant.  Obstetric History OB History  Gravida Para Term Preterm AB Living  3 3 3     3   SAB IAB Ectopic Multiple Live Births          3    # Outcome Date GA Lbr Len/2nd Weight Sex Delivery Anes PTL Lv  3 Term     F Vag-Spont  N LIV  2 Term     F Vag-Spont  N LIV  1 Term     F Vag-Spont  N LIV     ROS: A ROS was performed and pertinent positives and negatives are included in the history.  GENERAL: No fevers or chills. HEENT: No change in vision, no earache, sore throat or sinus congestion. NECK: No pain or stiffness. CARDIOVASCULAR: No chest pain or pressure. No palpitations. PULMONARY: No shortness of breath, cough or wheeze. GASTROINTESTINAL: No abdominal pain, nausea, vomiting or diarrhea, melena or bright red blood per rectum. GENITOURINARY: No urinary frequency, urgency, hesitancy or dysuria. MUSCULOSKELETAL: No joint or muscle pain, no back pain, no recent trauma. DERMATOLOGIC: No rash, no itching, no lesions. ENDOCRINE: No polyuria, polydipsia, no heat or cold intolerance. No recent change in weight. HEMATOLOGICAL: No anemia or easy bruising or bleeding. NEUROLOGIC: No headache, seizures, numbness, tingling or weakness. PSYCHIATRIC: No depression, no loss of interest in normal activity or change in sleep pattern.     Exam:   BP 140/90   Ht 5\' 2"  (1.575 m)    Wt 186 lb (84.4 kg)   BMI 34.02 kg/m   Body mass index is 34.02 kg/m.  General appearance : Well developed well nourished female. No acute distress HEENT: Eyes: no retinal hemorrhage or exudates,  Neck supple, trachea midline, no carotid bruits, no thyroidmegaly Lungs: Clear to auscultation, no rhonchi or wheezes, or rib retractions  Heart: Regular rate and rhythm, no murmurs or gallops Breast:Examined in sitting and supine position were symmetrical in appearance, no palpable masses or tenderness,  no skin retraction, no nipple inversion, no nipple discharge, no skin discoloration, no axillary or supraclavicular lymphadenopathy Abdomen: no palpable masses or tenderness, no rebound or guarding Extremities: no edema or skin discoloration or tenderness  Pelvic: Vulva: Normal             Vagina: No gross lesions or discharge  Cervix: No gross lesions or discharge  Uterus AV, normal size, shape and consistency, non-tender and mobile  Adnexa  Without masses or tenderness  Anus: Normal   Assessment/Plan:  50 y.o. female for annual exam   1. Well female exam with routine gynecological exam Normal gynecologic exam.  Pap test March 2021 was negative, no indication for Pap test this year.  Breast exam normal.  Screening mammogram scheduled for January 16, 2021.  We will repeat a colonoscopy next year.  Health labs with family physician.  2. Encounter for surveillance of implantable subdermal contraceptive Well on Nexplanon since September 2020.  No contraindication to continue.  3. Class 1 obesity due to excess calories without serious comorbidity with body mass index (BMI) of 34.0 to 34.9 in adult Recommend a lower calorie/carb nutrition.  Continue with aerobic activities 5 times a week and light weightlifting every 2 days recommended.  Princess Bruins MD, 8:15 AM 01/01/2021

## 2021-01-15 ENCOUNTER — Ambulatory Visit: Payer: BC Managed Care – PPO | Admitting: Psychology

## 2021-01-16 ENCOUNTER — Other Ambulatory Visit: Payer: Self-pay

## 2021-01-16 ENCOUNTER — Ambulatory Visit (INDEPENDENT_AMBULATORY_CARE_PROVIDER_SITE_OTHER): Payer: BC Managed Care – PPO | Admitting: Psychology

## 2021-01-16 ENCOUNTER — Ambulatory Visit
Admission: RE | Admit: 2021-01-16 | Discharge: 2021-01-16 | Disposition: A | Discharge status: Home / Self Care | Payer: BC Managed Care – PPO | Source: Ambulatory Visit | Attending: Obstetrics & Gynecology | Admitting: Obstetrics & Gynecology

## 2021-01-16 DIAGNOSIS — F411 Generalized anxiety disorder: Secondary | ICD-10-CM | POA: Diagnosis not present

## 2021-01-16 DIAGNOSIS — Z1231 Encounter for screening mammogram for malignant neoplasm of breast: Secondary | ICD-10-CM

## 2021-02-13 ENCOUNTER — Ambulatory Visit (INDEPENDENT_AMBULATORY_CARE_PROVIDER_SITE_OTHER): Payer: BC Managed Care – PPO | Admitting: Psychology

## 2021-02-13 DIAGNOSIS — F411 Generalized anxiety disorder: Secondary | ICD-10-CM | POA: Diagnosis not present

## 2021-02-17 ENCOUNTER — Ambulatory Visit (INDEPENDENT_AMBULATORY_CARE_PROVIDER_SITE_OTHER): Payer: BC Managed Care – PPO | Admitting: Plastic Surgery

## 2021-02-17 ENCOUNTER — Encounter: Payer: Self-pay | Admitting: Plastic Surgery

## 2021-02-17 ENCOUNTER — Other Ambulatory Visit: Payer: Self-pay

## 2021-02-17 VITALS — BP 153/102 | HR 80

## 2021-02-17 DIAGNOSIS — L910 Hypertrophic scar: Secondary | ICD-10-CM | POA: Diagnosis not present

## 2021-02-17 NOTE — Progress Notes (Signed)
Procedure Note  Preoperative Dx: Keloid of neck  Postoperative Dx: Same  Procedure: Kenalog injection to keloid of neck  Indication for Procedure: Keloid  Description of Procedure: Risks and complications were explained to the patient.  Consent was confirmed and the patient understands the risks and benefits.  The potential complications and alternatives were explained and the patient consents.  The patient expressed understanding the option of not having the procedure and the risks of a scar.  Time out was called and all information was confirmed to be correct.    The area was prepped and drapped.  Lidocaine 1% with epinephrine 0.2 cc was mixed with kenalog 0.2 cc of 50/5.  The keloid was injected with a mixture.  A dressing was applied.  The patient was given instructions on how to care for the area and a follow up appointment.  Megan Lang tolerated the procedure well and there were no complications.  This was her third injection so we will wait 3 to 6 months to see if we need to do another.  She is aware that the side effect can be hyperpigmentation.

## 2021-03-13 ENCOUNTER — Ambulatory Visit: Payer: BC Managed Care – PPO | Admitting: Psychology

## 2021-03-23 ENCOUNTER — Ambulatory Visit: Payer: BC Managed Care – PPO | Admitting: Psychology

## 2021-04-10 ENCOUNTER — Encounter: Payer: Self-pay | Admitting: Obstetrics & Gynecology

## 2021-04-10 ENCOUNTER — Other Ambulatory Visit: Payer: Self-pay

## 2021-04-10 ENCOUNTER — Ambulatory Visit (INDEPENDENT_AMBULATORY_CARE_PROVIDER_SITE_OTHER): Payer: BC Managed Care – PPO | Admitting: Obstetrics & Gynecology

## 2021-04-10 VITALS — BP 134/82

## 2021-04-10 DIAGNOSIS — Z3046 Encounter for surveillance of implantable subdermal contraceptive: Secondary | ICD-10-CM | POA: Diagnosis not present

## 2021-04-10 DIAGNOSIS — N951 Menopausal and female climacteric states: Secondary | ICD-10-CM

## 2021-04-10 LAB — FOLLICLE STIMULATING HORMONE: FSH: 36.7 m[IU]/mL

## 2021-04-10 NOTE — Progress Notes (Signed)
    Megan Lang Jul 14, 1971 [id number]        50 y.o.  G3P3L3   RP: Hot flushes/night sweats worsening x a few months  HPI: Well on Nexplanon x 06/2019.  No menses or BTB.  No pelvic pain. Worsening hot flushes and night sweats.  Low mood.  Dryness and discomfort with IC.     OB History  Gravida Para Term Preterm AB Living  3 3 3     3   SAB IAB Ectopic Multiple Live Births          3    # Outcome Date GA Lbr Len/2nd Weight Sex Delivery Anes PTL Lv  3 Term     F Vag-Spont  N LIV  2 Term     F Vag-Spont  N LIV  1 Term     F Vag-Spont  N LIV    Past medical history,surgical history, problem list, medications, allergies, family history and social history were all reviewed and documented in the EPIC chart.   Directed ROS with pertinent positives and negatives documented in the history of present illness/assessment and plan.  Exam:  Vitals:   04/10/21 0937  BP: 134/82   General appearance:  Normal  Gynecologic exam: Deferred   Assessment/Plan:  50 y.o. G3P3003   1. Perimenopause Will verify menopausal status with an [facility] today.  Will start with Phyto-Estrogens at this time like Black Cohosh or [employer].  Mild increase of Soy products in nutrition. - [facility]  2. Hot flushes, perimenopausal If menopause confirmed and Phyto-Estrogens not sufficient to reach a tolerable level of menopausal Sxs, will consider HRT. - [place]  3. Encounter for surveillance of implantable subdermal contraceptive  Continue on Nexplanon.  Contraception recommended up to 2 years into menopause.  Princess Bruins MD, 9:56 AM 04/10/2021

## 2021-04-13 ENCOUNTER — Encounter: Payer: Self-pay | Admitting: Obstetrics & Gynecology

## 2021-04-23 ENCOUNTER — Ambulatory Visit (INDEPENDENT_AMBULATORY_CARE_PROVIDER_SITE_OTHER): Payer: BC Managed Care – PPO | Admitting: Psychology

## 2021-04-23 DIAGNOSIS — F411 Generalized anxiety disorder: Secondary | ICD-10-CM | POA: Diagnosis not present

## 2021-05-12 ENCOUNTER — Encounter: Payer: Self-pay | Admitting: Neurology

## 2021-05-12 ENCOUNTER — Encounter: Payer: BC Managed Care – PPO | Admitting: Neurology

## 2021-05-12 ENCOUNTER — Ambulatory Visit (INDEPENDENT_AMBULATORY_CARE_PROVIDER_SITE_OTHER): Payer: BC Managed Care – PPO | Admitting: Neurology

## 2021-05-12 VITALS — BP 130/85 | HR 75 | Ht 62.0 in | Wt 186.8 lb

## 2021-05-12 DIAGNOSIS — R351 Nocturia: Secondary | ICD-10-CM

## 2021-05-12 DIAGNOSIS — R0683 Snoring: Secondary | ICD-10-CM

## 2021-05-12 DIAGNOSIS — E669 Obesity, unspecified: Secondary | ICD-10-CM | POA: Diagnosis not present

## 2021-05-12 DIAGNOSIS — R0681 Apnea, not elsewhere classified: Secondary | ICD-10-CM | POA: Diagnosis not present

## 2021-05-12 DIAGNOSIS — G4700 Insomnia, unspecified: Secondary | ICD-10-CM

## 2021-05-12 NOTE — Patient Instructions (Signed)

## 2021-05-12 NOTE — Progress Notes (Signed)
Error

## 2021-05-13 ENCOUNTER — Telehealth: Payer: Self-pay | Admitting: Neurology
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
## 2021-05-13 NOTE — Telephone Encounter (Signed)
error 

## 2021-05-21 ENCOUNTER — Ambulatory Visit (INDEPENDENT_AMBULATORY_CARE_PROVIDER_SITE_OTHER): Payer: BC Managed Care – PPO | Admitting: Psychology

## 2021-05-21 DIAGNOSIS — F411 Generalized anxiety disorder: Secondary | ICD-10-CM | POA: Diagnosis not present

## 2021-06-17 ENCOUNTER — Ambulatory Visit (INDEPENDENT_AMBULATORY_CARE_PROVIDER_SITE_OTHER): Payer: BC Managed Care – PPO | Admitting: Neurology

## 2021-06-17 DIAGNOSIS — R0683 Snoring: Secondary | ICD-10-CM

## 2021-06-17 DIAGNOSIS — G4733 Obstructive sleep apnea (adult) (pediatric): Secondary | ICD-10-CM

## 2021-06-17 DIAGNOSIS — R351 Nocturia: Secondary | ICD-10-CM

## 2021-06-17 DIAGNOSIS — R0681 Apnea, not elsewhere classified: Secondary | ICD-10-CM

## 2021-06-17 DIAGNOSIS — G4700 Insomnia, unspecified: Secondary | ICD-10-CM

## 2021-06-17 DIAGNOSIS — E669 Obesity, unspecified: Secondary | ICD-10-CM

## 2021-06-19 ENCOUNTER — Ambulatory Visit (INDEPENDENT_AMBULATORY_CARE_PROVIDER_SITE_OTHER): Payer: BC Managed Care – PPO | Admitting: Psychology

## 2021-06-19 DIAGNOSIS — F411 Generalized anxiety disorder: Secondary | ICD-10-CM

## 2021-06-25 NOTE — Procedures (Signed)
       [facility]  HOME SLEEP TEST (Watch PAT) REPORT  STUDY DATE: 06/17/2021  DOB: December 11, 1970  MRN: [id number]  ORDERING CLINICIAN: Star Age, MD, PhD   REFERRING CLINICIAN: Sharon Seller, NP  CLINICAL INFORMATION/HISTORY: 50 year old right-handed woman with an underlying medical history of reflux disease, allergies, aortic aneurysm, asthma, cardiomegaly, carpal tunnel syndrome, anxiety, depression, hypertension, and obesity, who reports snoring and excessive daytime somnolence as well as difficulty initiating and maintaining sleep.    Epworth sleepiness score: 8/24.  BMI: 34.0 kg/m  FINDINGS:   Sleep Summary:   Total Recording Time (hours, min): 7 hours, 14 minutes  Total Sleep Time (hours, min):  6 hours, 25 minutes   Percent REM (%):    19.2   Respiratory Indices:   Calculated pAHI (per hour):  23.7/hour         REM pAHI:    47.7/hour       NREM pAHI: 17.8/hour  Oxygen Saturation Statistics:    Oxygen Saturation (%) Mean: 92%   Minimum oxygen saturation (%):                 78%   O2 Saturation Range (%): 78-98%    O2 Saturation (minutes) <=88%: 2.1 min  Pulse Rate Statistics:   Pulse Mean (bpm):    89/min    Pulse Range (66-117/min)   IMPRESSION: OSA (obstructive sleep apnea)   RECOMMENDATION:  This home sleep test demonstrates moderate obstructive sleep apnea with a total AHI of 23.7/hour and O2 nadir of 78%.  Mild to moderate snoring was detected.  Treatment with positive airway pressure is recommended. The patient will be advised to proceed with an autoPAP titration/trial at home for now. A full night titration study may be considered to optimize treatment settings, if needed down the road. Please note that untreated obstructive sleep apnea may carry additional perioperative morbidity. Patients with significant obstructive sleep apnea should receive perioperative PAP therapy and the surgeons and particularly the  anesthesiologist should be informed of the diagnosis and the severity of the sleep disordered breathing.  Alternative treatment options may include dental device or surgical options such as a hypoglossal nerve stimulator but positive airway pressure treatment is considered first-line. The patient should be cautioned not to drive, work at heights, or operate dangerous or heavy equipment when tired or sleepy. Review and reiteration of good sleep hygiene measures should be pursued with any patient. Other causes of the patient's symptoms, including circadian rhythm disturbances, an underlying mood disorder, medication effect and/or an underlying medical problem cannot be ruled out based on this test. Clinical correlation is recommended. The patient and her referring provider will be notified of the test results. The patient will be seen in follow up in sleep clinic at [facility]  I certify that I have reviewed the raw data recording prior to the issuance of this report in accordance with the standards of the American Academy of Sleep Medicine (AASM).   INTERPRETING PHYSICIAN:   Star Age, MD, PhD  Board Certified in Neurology and Sleep Medicine  [facility] [address] [place], [place] [address] [phone]

## 2021-06-25 NOTE — Progress Notes (Signed)
See procedure note.

## 2021-06-25 NOTE — Addendum Note (Signed)
Addended by: Star Age on: 06/25/2021 04:20 PM   Modules accepted: Orders

## 2021-07-01 ENCOUNTER — Telehealth: Payer: Self-pay | Admitting: *Deleted

## 2021-07-01 NOTE — Telephone Encounter (Signed)
-----   Message from Star Age, MD sent at 06/25/2021  4:18 PM EDT ----- Patient referred by Sharon Seller, NP, seen by me on 05/12/2021, patient had a HST on 06/17/2021.    Please call and notify the patient that the recent home sleep test showed obstructive sleep apnea in the moderate range. I recommend treatment in the form of autoPAP, which means, that we don't have to bring her in for a sleep study with CPAP, but will let her start using a so called autoPAP machine at home, which is a CPAP-like machine with self-adjusting pressures. We will send the order to a local DME company (of her choice, or as per insurance requirement). The DME representative will fit her with a mask, educate her on how to use the machine, how to put the mask on, etc. I have placed an order in the chart. Please send the order, talk to patient, send report to referring MD. We will need a FU in sleep clinic for 10 weeks post-PAP set up, please arrange that with me or one of our NPs. Also reinforce the need for compliance with treatment. Thanks,   Star Age, MD, PhD [facility])

## 2021-07-01 NOTE — Telephone Encounter (Signed)
LMVM for pt but also sent mychart message.

## 2021-07-02 NOTE — Telephone Encounter (Signed)
CM sent to [facility] for new cpap order.

## 2021-07-06 NOTE — Telephone Encounter (Signed)
Denyse Amass, RN Got it!   Thanks!

## 2021-07-16 ENCOUNTER — Institutional Professional Consult (permissible substitution): Payer: BC Managed Care – PPO | Admitting: Neurology

## 2021-07-24 ENCOUNTER — Ambulatory Visit: Payer: BC Managed Care – PPO | Admitting: Psychology

## 2021-08-20 ENCOUNTER — Ambulatory Visit (INDEPENDENT_AMBULATORY_CARE_PROVIDER_SITE_OTHER): Payer: BC Managed Care – PPO | Admitting: Psychology

## 2021-08-20 DIAGNOSIS — F411 Generalized anxiety disorder: Secondary | ICD-10-CM

## 2021-09-17 ENCOUNTER — Ambulatory Visit (INDEPENDENT_AMBULATORY_CARE_PROVIDER_SITE_OTHER): Payer: BC Managed Care – PPO | Admitting: Psychology

## 2021-09-17 DIAGNOSIS — F411 Generalized anxiety disorder: Secondary | ICD-10-CM

## 2021-10-15 ENCOUNTER — Ambulatory Visit: Payer: BC Managed Care – PPO | Admitting: Psychology

## 2021-10-21 ENCOUNTER — Ambulatory Visit (INDEPENDENT_AMBULATORY_CARE_PROVIDER_SITE_OTHER): Payer: BC Managed Care – PPO | Admitting: Psychology

## 2021-10-21 DIAGNOSIS — F411 Generalized anxiety disorder: Secondary | ICD-10-CM | POA: Diagnosis not present
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
## 2021-11-18 ENCOUNTER — Ambulatory Visit (INDEPENDENT_AMBULATORY_CARE_PROVIDER_SITE_OTHER): Payer: BC Managed Care – PPO | Admitting: Psychology

## 2021-11-18 DIAGNOSIS — F411 Generalized anxiety disorder: Secondary | ICD-10-CM | POA: Diagnosis not present
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
## 2021-12-15 ENCOUNTER — Other Ambulatory Visit: Payer: Self-pay | Admitting: Obstetrics & Gynecology

## 2021-12-15 DIAGNOSIS — Z1231 Encounter for screening mammogram for malignant neoplasm of breast: Secondary | ICD-10-CM

## 2021-12-16 ENCOUNTER — Ambulatory Visit (INDEPENDENT_AMBULATORY_CARE_PROVIDER_SITE_OTHER): Payer: BC Managed Care – PPO | Admitting: Psychology

## 2021-12-16 DIAGNOSIS — F411 Generalized anxiety disorder: Secondary | ICD-10-CM | POA: Diagnosis not present

## 2021-12-16 NOTE — Progress Notes (Signed)
[place] Counselor/Therapist Progress Note ? ?Patient ID: Megan Lang, MRN: [id number],   ? ?Date: 12/16/2021 ? ?Time Spent: 30 mins  ? ?Treatment Type: Individual Therapy ? ?Reported Symptoms: Pt presents for face to face follow up session via webex video due to the virus outbreak.  Pt grants verbal consent for visit and states she at work with no one else around.  I shared with pt that I am in my office at home alone as well.  Pt shares that her "I have been really busy at work and at home." ? ?Mental Status Exam: ?Appearance:  Casual     ?Behavior: Appropriate  ?Motor: Normal  ?Speech/Language:  Clear and Coherent  ?Affect: Appropriate  ?Mood: normal  ?Thought process: normal  ?Thought content:   WNL  ?Sensory/Perceptual disturbances:   WNL  ?Orientation: oriented to person, place, and time/date  ?Attention: Good  ?Concentration: Good  ?Memory: WNL  ?Fund of knowledge:  Good  ?Insight:   Good  ?Judgment:  Good  ?Impulse Control: Good  ? ?Risk Assessment: ?Danger to Self:  No ?Self-injurious Behavior: No ?Danger to Others: No ?Duty to Warn:no ?Physical Aggression / Violence:No  ?Access to Firearms a concern: No  ?Gang Involvement:No  ? ?Subjective: Pt shares that she has "a whole lot to do at work and it is hard to get it all done."  Pt shares she continues to have trouble with her shoulder and her wrist (surgery to be done in July when she is out of school); "I have been to the doctor for both of them.  I got an injection in my shoulder and have had three injections in my wrist."  Pt shares she continues to have some issues with stress eating due to her physical issues but she is trying to make good food choices.  Pt shares she continues to get massages for self care; also reading, a little shopping, pedicures, etc.  Pt shares her husband is still working out of town but is doing well.  They FaceTime 3-4 times per day during the week when he is in [facility] working.  Pt shares her daughter is going  to have her second child (boy) in April and pt is planning to keep the oldest granddaughter for two weeks in June to give her daughter a break.  Pt shares she has had a couple of anxious episodes related to work stress and her shoulder pain as well.  When these happen, pt goes to her office in the school and gets quiet and works on her deep breathing and this seems to help calm her down.  Cymbalta, Buspirone, and Prozac are her current medications from her psychiatrist; she also takes HBP medication as well.  Her sleep continues to be up and down; she normally wakes up once or twice per night but normally goes back to sleep OK; sometimes uses meditations to help get back to sleep, if needed.  Encouraged pt to try decaffeinated tea bags so she uses less caffeine in the evenings.  Encouraged pt to continue with her self care activities and we will meet in 4 wks for a follow up session.   ? ?Interventions: Cognitive Behavioral Therapy ? ?Diagnosis:Generalized anxiety disorder ? ?Plan: Treatment Plan ?Strengths/Abilities:  Intelligent, Intuitive, Willing to participate in therapy ?Treatment Preferences:  Outpatient Individual Therapy ?Statement of Needs:  Patient is to use CBT, mindfulness and coping skills to help manage and/or decrease symptoms associated with their diagnosis. ?Symptoms:  Irritable mood, worry, social  withdrawal ?Problems Addressed:  Anxious thoughts, Sadness, Sleep issues, etc. ?Long Term Goals:  Pt to reduce overall level, frequency, and intensity of the feelings of anxiety as evidenced by decreased irritability, negative self talk, and helpless feelings from 6 to 7 days/week to 0 to 1 days/week, per client report, for at least 3 consecutive months.  Progress: 30% ?Short Term Goals:  Pt to verbally express understanding of the relationship between feelings of anxiety and their impact on thinking patterns and behaviors.  Pt to verbalize an understanding of the role that distorted thinking plays in  creating fears, excessive worry, and ruminations.  Progress: 30% ?Target Date:  10/21/2022 ?Frequency:  Monthly ?Modality:  Cognitive Behavioral Therapy ?Interventions by Therapist:  Therapist will use CBT, Mindfulness exercises, Coping skills and Referrals, as needed by client. ?Client has verbally approved this treatment plan. ? ?Ivan Anchors, [facility] ?

## 2022-01-06 ENCOUNTER — Other Ambulatory Visit: Payer: Self-pay

## 2022-01-06 ENCOUNTER — Ambulatory Visit (INDEPENDENT_AMBULATORY_CARE_PROVIDER_SITE_OTHER): Payer: BC Managed Care – PPO | Admitting: Obstetrics & Gynecology

## 2022-01-06 ENCOUNTER — Other Ambulatory Visit (HOSPITAL_COMMUNITY)
Admission: RE | Admit: 2022-01-06 | Discharge: 2022-01-06 | Disposition: A | Discharge status: Home / Self Care | Payer: BC Managed Care – PPO | Source: Ambulatory Visit | Attending: Obstetrics & Gynecology | Admitting: Obstetrics & Gynecology

## 2022-01-06 ENCOUNTER — Encounter: Payer: Self-pay | Admitting: Obstetrics & Gynecology

## 2022-01-06 VITALS — BP 124/80 | HR 82 | Resp 16 | Ht 62.25 in | Wt 184.0 lb

## 2022-01-06 DIAGNOSIS — N898 Other specified noninflammatory disorders of vagina: Secondary | ICD-10-CM

## 2022-01-06 DIAGNOSIS — Z3046 Encounter for surveillance of implantable subdermal contraceptive: Secondary | ICD-10-CM

## 2022-01-06 DIAGNOSIS — Z01419 Encounter for gynecological examination (general) (routine) without abnormal findings: Secondary | ICD-10-CM

## 2022-01-06 LAB — WET PREP FOR TRICH, YEAST, CLUE

## 2022-01-06 NOTE — Progress Notes (Signed)
? ? ?Megan Lang 1970/11/23 [id number] ? ? ?History:    51 y.o. G3P3L3 Married.  3 daughters 27-29-31.  Has 6 grand-children, 1 on the way. ?  ?RP:  Established patient presenting for annual gyn exam  ?  ?HPI: Well on [place] since 06/2019.  Occasional light menses.  No pelvic pain.  No pain with intercourse.  Thick vaginal secretions, no itching, no odor, unchanged. Last Pap 12/2019 Neg.  Pap reflex today.  Breasts normal.  Mammo Neg 01/2021, scheduled in 01/2022.  Body mass index 33.38.  Walks regularly.  Health labs with family physician. Colono 05/2016. ? ? ?Past medical history,surgical history, family history and social history were all reviewed and documented in the EPIC chart. ? ?Gynecologic History ?No LMP recorded. Patient has had an implant. ? ?Obstetric History ?OB History  ?Gravida Para Term Preterm AB Living  ?'3 3 3     3  '$ ?SAB IAB Ectopic Multiple Live Births  ?        3  ?  ?# Outcome Date GA Lbr Len/2nd Weight Sex Delivery Anes PTL Lv  ?3 Term     F Vag-Spont  N LIV  ?2 Term     F Vag-Spont  N LIV  ?1 Term     F Vag-Spont  N LIV  ? ? ? ?ROS: A ROS was performed and pertinent positives and negatives are included in the history. ? GENERAL: No fevers or chills. HEENT: No change in vision, no earache, sore throat or sinus congestion. NECK: No pain or stiffness. CARDIOVASCULAR: No chest pain or pressure. No palpitations. PULMONARY: No shortness of breath, cough or wheeze. GASTROINTESTINAL: No abdominal pain, nausea, vomiting or diarrhea, melena or bright red blood per rectum. GENITOURINARY: No urinary frequency, urgency, hesitancy or dysuria. MUSCULOSKELETAL: No joint or muscle pain, no back pain, no recent trauma. DERMATOLOGIC: No rash, no itching, no lesions. ENDOCRINE: No polyuria, polydipsia, no heat or cold intolerance. No recent change in weight. HEMATOLOGICAL: No anemia or easy bruising or bleeding. NEUROLOGIC: No headache, seizures, numbness, tingling or weakness. PSYCHIATRIC: No depression, no  loss of interest in normal activity or change in sleep pattern.  ?  ? ?Exam: ? ? ?BP 124/80   Pulse 82   Resp 16   Ht 5' 2.25" (1.581 m)   Wt 184 lb (83.5 kg)   BMI 33.38 kg/m?  ? ?Body mass index is 33.38 kg/m?. ? ?General appearance : Well developed well nourished female. No acute distress ?HEENT: Eyes: no retinal hemorrhage or exudates,  Neck supple, trachea midline, no carotid bruits, no thyroidmegaly ?Lungs: Clear to auscultation, no rhonchi or wheezes, or rib retractions  ?Heart: Regular rate and rhythm, no murmurs or gallops ?Breast:Examined in sitting and supine position were symmetrical in appearance, no palpable masses or tenderness,  no skin retraction, no nipple inversion, no nipple discharge, no skin discoloration, no axillary or supraclavicular lymphadenopathy ?Abdomen: no palpable masses or tenderness, no rebound or guarding ?Extremities: no edema or skin discoloration or tenderness ? ?Pelvic: Vulva: Normal ?            Vagina: No gross lesions.  Mild discharge.  Wet prep done. ? Cervix: No gross lesions or discharge.  Pap reflex done. ? Uterus  AV, normal size, shape and consistency, non-tender and mobile ? Adnexa  Without masses or tenderness ? Anus: Normal ? ?Wet prep Neg. ? ? ?Assessment/Plan:  51 y.o. female for annual exam  ? ?1. Encounter for routine gynecological examination with Papanicolaou  smear of cervix ?Well on Nexplanon since 06/2019.  Occasional light menses.  No pelvic pain.  No pain with intercourse.  Thick vaginal secretions, no itching, no odor, unchanged. Last Pap 12/2019 Neg.  Pap reflex today.  Breasts normal.  Mammo Neg 01/2021, scheduled in 01/2022.  Body mass index 33.38.  Walks regularly.  Health labs with family physician. Colono 05/2016. ?- Cytology - PAP( [place]) ? ?2. Encounter for surveillance of implantable subdermal contraceptive ?Well on Nexplanon.  No CI to continue.  Will switch to a new Nexplanon in 06/2022. ? ?3. Vaginal discharge ?Wet prep Neg.   Reassured. ?- WET PREP FOR [place], YEAST, CLUE ? ?Other orders ?- amLODipine (NORVASC) 10 MG tablet; Take 10 mg by mouth daily. ?- bisoprolol (ZEBETA) 5 MG tablet; Take 5 mg by mouth daily.  ? ?Princess Bruins MD, 8:39 AM 01/06/2022 ? ?  ?

## 2022-01-08 LAB — CYTOLOGY - PAP: Diagnosis: NEGATIVE

## 2022-01-13 ENCOUNTER — Ambulatory Visit (INDEPENDENT_AMBULATORY_CARE_PROVIDER_SITE_OTHER): Payer: BC Managed Care – PPO | Admitting: Psychology

## 2022-01-13 DIAGNOSIS — F411 Generalized anxiety disorder: Secondary | ICD-10-CM | POA: Diagnosis not present

## 2022-01-13 NOTE — Progress Notes (Signed)
[place] Counselor/Therapist Progress Note ? ?Patient ID: LASHYA PASSE, MRN: [id number],   ? ?Date: 01/13/2022 ? ?Time Spent: 30 mins  ? ?Treatment Type: Individual Therapy ? ?Reported Symptoms: Pt presents for face to face follow up session via webex video due to the virus outbreak.  Pt grants verbal consent for visit and states she at work with no one else around.  I shared with pt that I am in my office at home alone as well.  Pt shares that "I am stressed out at work; I am in the process of transferring to a new position because they wanted to make my current job a 12 month position rather than the current 10 month position." ? ?Mental Status Exam: ?Appearance:  Casual     ?Behavior: Appropriate  ?Motor: Normal  ?Speech/Language:  Clear and Coherent  ?Affect: Appropriate  ?Mood: normal  ?Thought process: normal  ?Thought content:   WNL  ?Sensory/Perceptual disturbances:   WNL  ?Orientation: oriented to person, place, and time/date  ?Attention: Good  ?Concentration: Good  ?Memory: WNL  ?Fund of knowledge:  Good  ?Insight:   Good  ?Judgment:  Good  ?Impulse Control: Good  ? ?Risk Assessment: ?Danger to Self:  No ?Self-injurious Behavior: No ?Danger to Others: No ?Duty to Warn:no ?Physical Aggression / Violence:No  ?Access to Firearms a concern: No  ?Gang Involvement:No  ? ?Subjective: Pt shares that she is interviewing at [employer] on Friday for a 10 month position.  Pt shares her supervisor is aware that she is wanting to stay in a 10 month position.  Pt shares this new circumstance has been stressful and anxiety producing for pt.  Pt shares that her husband took her out to shop this past weekend for Easter and to dinner.  Pt shares her sleeping has been up and down; she continues to take the Trazodone which helps her sleep.  She would rather not have to take the Trazodone because it makes her dream more and she doesn't like those dreams.  Pt tries to avoid having sugary drinks  in the evenings so she can fall asleep well.   Pt's husband will be off from Good Friday through Monday and will be home for that time.  Pt will stay home the rest of that week of Spring Break.  Pt shares she had her annual physical last week and the results were all positive.  Pt shares her other self care activities include pedicure and massages, watching movies, and reading her Bible regularly.  Pt shares  her youngest daughter is having her son in about a month.  Pt is planning to keep her 51 yo granddaughter for a week this summer to give her daughter time with her newborn son.  Pt also continues to take her Cymbalta, Buspirone and Prozac without any issues.  Pt is "maintaining" with her wrist and shoulder right now; she ices them both down daily to help with the inflammation.  She is hoping to avoid surgery until the summer.  Encouraged pt to continue with her self care activities and we will meet in 4 wks for a follow up session.   ? ?Interventions: Cognitive Behavioral Therapy ? ?Diagnosis:Generalized anxiety disorder ? ?Plan: Treatment Plan ?Strengths/Abilities:  Intelligent, Intuitive, Willing to participate in therapy ?Treatment Preferences:  Outpatient Individual Therapy ?Statement of Needs:  Patient is to use CBT, mindfulness and coping skills to help manage and/or decrease symptoms associated with their diagnosis. ?Symptoms:  Irritable mood, worry, social  withdrawal ?Problems Addressed:  Anxious thoughts, Sadness, Sleep issues, etc. ?Long Term Goals:  Pt to reduce overall level, frequency, and intensity of the feelings of anxiety as evidenced by decreased irritability, negative self talk, and helpless feelings from 6 to 7 days/week to 0 to 1 days/week, per client report, for at least 3 consecutive months.  Progress: 30% ?Short Term Goals:  Pt to verbally express understanding of the relationship between feelings of anxiety and their impact on thinking patterns and behaviors.  Pt to verbalize an  understanding of the role that distorted thinking plays in creating fears, excessive worry, and ruminations.  Progress: 30% ?Target Date:  10/21/2022 ?Frequency:  Monthly ?Modality:  Cognitive Behavioral Therapy ?Interventions by Therapist:  Therapist will use CBT, Mindfulness exercises, Coping skills and Referrals, as needed by client. ?Client has verbally approved this treatment plan. ? ?Ivan Anchors, [facility] ?

## 2022-01-18 ENCOUNTER — Ambulatory Visit
Admission: RE | Admit: 2022-01-18 | Discharge: 2022-01-18 | Disposition: A | Discharge status: Home / Self Care | Payer: BC Managed Care – PPO | Source: Ambulatory Visit | Attending: Obstetrics & Gynecology | Admitting: Obstetrics & Gynecology

## 2022-01-18 DIAGNOSIS — Z1231 Encounter for screening mammogram for malignant neoplasm of breast: Secondary | ICD-10-CM

## 2022-01-26 ENCOUNTER — Other Ambulatory Visit: Payer: Self-pay | Admitting: *Deleted

## 2022-01-26 DIAGNOSIS — I7121 Aneurysm of the ascending aorta, without rupture: Secondary | ICD-10-CM

## 2022-02-12 ENCOUNTER — Ambulatory Visit (INDEPENDENT_AMBULATORY_CARE_PROVIDER_SITE_OTHER): Payer: BC Managed Care – PPO | Admitting: Psychology

## 2022-02-12 DIAGNOSIS — F411 Generalized anxiety disorder: Secondary | ICD-10-CM | POA: Diagnosis not present

## 2022-02-12 NOTE — Progress Notes (Addendum)
[place] Counselor/Therapist Progress Note ? ?Patient ID: Megan Lang, MRN: [id number],   ? ?Date: 02/12/2022 ? ?Time Spent: 30 mins  ? ?Treatment Type: Individual Therapy ? ?Reported Symptoms: Pt presents for face to face follow up session via webex video due to the virus outbreak.  Pt grants verbal consent for visit and states she at work with no one else around.  I shared with pt that I am in my office at home alone as well.   ? ?Mental Status Exam: ?Appearance:  Casual     ?Behavior: Appropriate  ?Motor: Normal  ?Speech/Language:  Clear and Coherent  ?Affect: Appropriate  ?Mood: normal  ?Thought process: normal  ?Thought content:   WNL  ?Sensory/Perceptual disturbances:   WNL  ?Orientation: oriented to person, place, and time/date  ?Attention: Good  ?Concentration: Good  ?Memory: WNL  ?Fund of knowledge:  Good  ?Insight:   Good  ?Judgment:  Good  ?Impulse Control: Good  ? ?Risk Assessment: ?Danger to Self:  No ?Self-injurious Behavior: No ?Danger to Others: No ?Duty to Warn:no ?Physical Aggression / Violence:No  ?Access to Firearms a concern: No  ?Gang Involvement:No  ? ?Subjective: Pt shares that she is still interviewing at [employer] on Friday for a 10 month position.  The hours at [employer] [facility] [facility] would be 1pm to 930pm and pt likes those hours better; fewer people around.  Pt shares "I have a new grandson Sydnee Cabal); he was born 4/10."  Pt is happy about the new baby; her daughter and the baby are doing well.  Pt is going to [place] where they live to pick up her grand daughter in June to bring her back to [facility] for two weeks.  Pt shares that her sleep continues to be "off and on.  I am still waking up once or twice to go to the bathroom.  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  Pt and her husband enjoyed Easter and staying home for the long weekend.  Pt had her annual physical  and it went well.  She also had a good mammogram result since our last session.  She has a CT scan coming up on 5/15 for her aneurysm and will see the cardiologist after that to review the results.  Pt shares she has been taking walks recently and she has a massage scheduled for tomorrow.  She has also continued reading her Bible as well.  Pt continues to take her Prozac, Buspirone and Cymbalta with no side effects.  Pt shares that she is still having numbness and pain in her wrist, elbow and shoulder.  She is hoping to have the office procedures from the orthopedist in early July, since she will be keeping her grand daughter in June.  Encouraged pt to continue with her self care activities and we will meet in 4 wks for a follow up session.   ? ?Interventions: Cognitive Behavioral Therapy ? ?Diagnosis:Generalized anxiety disorder ? ?Plan: Treatment Plan ?Strengths/Abilities:  Intelligent, Intuitive, Willing to participate in therapy ?Treatment Preferences:  Outpatient Individual Therapy ?Statement of Needs:  Patient is to use CBT, mindfulness and coping skills to help manage and/or decrease symptoms associated with their diagnosis. ?Symptoms:  Irritable mood, worry, social withdrawal ?Problems Addressed:  Anxious thoughts, Sadness, Sleep issues, etc. ?Long Term Goals:  Pt to reduce overall level, frequency, and intensity of the feelings of anxiety as evidenced  by decreased irritability, negative self talk, and helpless feelings from 6 to 7 days/week to 0 to 1 days/week, per client report, for at least 3 consecutive months.  Progress: 30% ?Short Term Goals:  Pt to verbally express understanding of the relationship between feelings of anxiety and their impact on thinking patterns and behaviors.  Pt to verbalize an understanding of the role that distorted thinking plays in creating fears, excessive worry, and ruminations.  Progress: 30% ?Target Date:  10/21/2022 ?Frequency:  Monthly ?Modality:  Cognitive Behavioral  Therapy ?Interventions by Therapist:  Therapist will use CBT, Mindfulness exercises, Coping skills and Referrals, as needed by client. ?Client has verbally approved this treatment plan. ? ?Ivan Anchors, [facility] ?

## 2022-03-01 ENCOUNTER — Ambulatory Visit
Admission: RE | Admit: 2022-03-01 | Discharge: 2022-03-01 | Disposition: A | Discharge status: Home / Self Care | Payer: BC Managed Care – PPO | Source: Ambulatory Visit | Attending: Cardiothoracic Surgery | Admitting: Cardiothoracic Surgery

## 2022-03-01 ENCOUNTER — Ambulatory Visit (INDEPENDENT_AMBULATORY_CARE_PROVIDER_SITE_OTHER): Payer: BC Managed Care – PPO | Admitting: Physician Assistant

## 2022-03-01 VITALS — BP 156/99 | HR 89 | Resp 20 | Ht 62.0 in | Wt 181.0 lb

## 2022-03-01 DIAGNOSIS — I7121 Aneurysm of the ascending aorta, without rupture: Secondary | ICD-10-CM

## 2022-03-01 MED ORDER — IOPAMIDOL (ISOVUE-370) INJECTION 76%
75.0000 mL | Freq: Once | INTRAVENOUS | Status: AC | PRN
  Administered 2022-03-01: 75 mL via INTRAVENOUS

## 2022-03-01 NOTE — Patient Instructions (Signed)
Patient is counseled regarding the importance of long term risk factor modification as they pertain to the presence of ischemic heart disease including avoiding the use of all tobacco products, dietary modifications and medical therapy for diabetes, cholesterol and lipid management, and regular exercise.   ? ? ?AVOID FLUOROQUINOLONES (ex: Cipro) AS THEY CAN INCREASE YOUR RISK OF AORTIC DISSECTION ?

## 2022-03-01 NOTE — Progress Notes (Signed)
? ?   ?[place].[address] ?      [place] [address] ?            [phone]   ? ?  ? ? ?Megan Lang ?[id number] ?Oct 01, 1971 ? ?History of Present Illness: ? ? ?Megan Lang is a 51 yo obese female with history of HTN and known Ascending Aortic Aneurysm.  She has been routinely followed by Dr. Prescott Gum with last scan and follow up occurring in November of 2021.  At that time her Aneurysm remained stable at 4.1 cm which had remained unchanged since 2016.  She presents today for repeat imaging.  Overall she is doing okay.  She states her blood pressure reading makes her a little nervous today.  She is also having some issues with her arthritis from her previous orthopedic surgeries.  She continues to remain smoke free. ? ?Current Outpatient Medications on File Prior to Visit  ?Medication Sig Dispense Refill  ? albuterol (PROVENTIL HFA;VENTOLIN HFA) 108 (90 Base) MCG/ACT inhaler Inhale 1-2 puffs into the lungs every 6 (six) hours as needed for wheezing or shortness of breath. 1 Inhaler 0  ? amLODipine (NORVASC) 10 MG tablet Take 10 mg by mouth daily.    ? azelastine (ASTELIN) 0.1 % nasal spray Place 2 sprays into both nostrils 2 (two) times daily. Use in each nostril as directed 30 mL 0  ? bisoprolol (ZEBETA) 5 MG tablet Take 5 mg by mouth daily.    ? busPIRone (BUSPAR) 15 MG tablet Take 1 tablet (15 mg total) by mouth 2 (two) times daily. (Patient taking differently: Take 30 mg by mouth daily.) 60 tablet 0  ? DULoxetine (CYMBALTA) 30 MG capsule Take 30 mg by mouth every morning.    ? etonogestrel (NEXPLANON) 68 MG IMPL implant 1 each by Subdermal route once.    ? traZODone (DESYREL) 50 MG tablet Take 50 mg by mouth at bedtime.    ? Vitamin D, Ergocalciferol, (DRISDOL) 1.25 MG (50000 UNIT) CAPS capsule Take 50,000 Units by mouth once a week.    ? ?No current facility-administered medications on file prior to visit.  ? ? ? ?BP (!) 156/99 (BP Location: Right Arm, Patient Position: Sitting)   Pulse 89    Resp 20   Ht '5\' 2"'$  (1.575 m)   Wt 181 lb (82.1 kg)   SpO2 98%   BMI 33.11 kg/m?  ? ?Physical Exam ? ?Gen: NAD ?Heart: RRR ?Lungs: CTA bilaterally ?Abd: soft non-tender, non-distended ?Ext: no edema ?Neck: no bruit ?Neuro: grossly intact ? ?CTA Results: ? ?FINDINGS: ?Cardiovascular: Ascending aorta measures up to 4.1 cm (9/58 and 63), ?stable from 08/27/2020. Heart is at the upper limits of normal in ?size. No pericardial effusion. ?  ?Mediastinum/Nodes: Prevascular soft tissue has been present on exams ?dating back to at least 10/10/2015, compatible with thymus. No ?pathologically enlarged mediastinal, hilar or axillary lymph nodes. ?Esophagus is grossly unremarkable. ?  ?Lungs/Pleura: Minimal scarring in the right middle lobe. Lungs are ?otherwise clear. No pleural fluid. Airway is unremarkable. ?  ?Upper Abdomen: Fluid density low-attenuation lesion in the dome of ?the liver measures 2.4 cm, unchanged from 08/27/2020, compatible ?with a cyst. Liver appears mildly heterogeneous in attenuation. ?Visualized portions of the liver, gallbladder, adrenal glands, ?kidneys, spleen, pancreas, stomach and bowel are otherwise ?unremarkable. ?  ?Musculoskeletal: No worrisome lytic or sclerotic lesions. ?  ?Review of the MIP images confirms the above findings. ?  ?IMPRESSION: ?1. 4.1 cm ascending aortic aneurysm, stable.  Recommend annual ?imaging followup by CTA or MRA. This recommendation follows 2010 ?ACCF/AHA/AATS/ACR/ASA/SCA/SCAI/SIR/STS/SVM Guidelines for the ?Diagnosis and Management of Patients with Thoracic Aortic Disease. ?Circulation. 2010; 121: C376-E831. Aortic aneurysm NOS ?(ICD10-I71.9). ?2. Liver may be mildly steatotic. ?  ?  ?Electronically Signed ?  By: Lorin Picket M.D. ?  On: 03/01/2022 14:12 ? ? ? ?A/P: ? ?Ascending Aortic Aneurysm remains stable at 4.1 cm, this remains unchanged from 2016 ?HTN- BP elevated today in the 150s , normally controlled on Norvasc and ZIAC... patient instructed to monitor  BP at home if this remains persistently elevated she was instructed to increase her Ziac to 10 mg daily and follow up with her primary physician ?RTC in 18 months with repeat CTA chest  ? ? ? ?Risk Modification: ? ?Statin:  No, instructed patient to discuss with PCP as this can be beneficial to patients with aneurysm ? ?Smoking cessation instruction/counseling given:  Patient has never smoked ? ?Patient was counseled on importance of Blood Pressure Control.  Despite Medical intervention if the patient notices persistently elevated blood pressure readings.  They are instructed to contact their Primary Care Physician ? ?Please avoid use of Fluoroquinolones as this can potentially increase your risk of Aortic Rupture and/or Dissection ? ?Patient educated on signs and symptoms of Aortic Dissection, handout also provided in AVS ? ?Ellwood Handler, PA-C ?03/01/22 ? ? ? ? ?

## 2022-03-10 ENCOUNTER — Ambulatory Visit: Payer: BC Managed Care – PPO | Admitting: Psychology

## 2022-04-17 HISTORY — PX: CARPAL TUNNEL RELEASE: SHX101

## 2022-06-09 ENCOUNTER — Ambulatory Visit: Payer: BC Managed Care – PPO | Admitting: Psychology

## 2022-06-17 ENCOUNTER — Ambulatory Visit (INDEPENDENT_AMBULATORY_CARE_PROVIDER_SITE_OTHER): Payer: BC Managed Care – PPO | Admitting: Psychology

## 2022-06-17 DIAGNOSIS — F411 Generalized anxiety disorder: Secondary | ICD-10-CM | POA: Diagnosis not present

## 2022-06-17 NOTE — Progress Notes (Signed)
[place] Counselor/Therapist Progress Note  Patient ID: Megan Lang, MRN: [id number],    Date: 06/17/2022  Time Spent: 45 mins   Treatment Type: Individual Therapy  Reported Symptoms: Pt presents for face to face follow up session via phone due to power outage at her home.  Pt grants verbal consent for visit and states she at home with no one else around.  I shared with pt that I am in my office at home alone as well.    Mental Status Exam: Appearance:  Casual     Behavior: Appropriate  Motor: Normal  Speech/Language:  Clear and Coherent  Affect: Appropriate  Mood: normal  Thought process: normal  Thought content:   WNL  Sensory/Perceptual disturbances:   WNL  Orientation: oriented to person, place, and time/date  Attention: Good  Concentration: Good  Memory: WNL  Fund of knowledge:  Good  Insight:   Good  Judgment:  Good  Impulse Control: Good   Risk Assessment: Danger to Self:  No Self-injurious Behavior: No Danger to Others: No Duty to Warn:no Physical Aggression / Violence:No  Access to Firearms a concern: No  Gang Involvement:No   Subjective: Pt shares that she had carpal tunnel surgery on her right wrist last month; she is recovering pretty well.  She has been out of work since the beginning of school a couple of weeks ago.  She is hoping she can go back to work soon; it has been 8 weeks since her surgery.  She continues to have some discomfort but hopes she will be well soon.  She has a brace she can wear if she needs to.  She goes back to the surgeon on 9/12 for a check.  Pt shares that her summer was busy; they went to [place] for her husband's family reunion.  Pt shares she also had one of her granddaughter's for two weeks this summer while her daughter bonded with her new baby.  Her husband took 30 days off this summer to be with their granddaughter and pt while she had her surgery.  Pt has seen her psychiatrist a couple of times this summer; she  continues to have Trazodone for sleep and uses Cymbalta and Buspar for her depression and anxiety.  Pt also shares she has borderline diabetes and HBP.  Her PCP now has her on Ozempic and it is helping her lose weight as well (lost about 10 pounds so far).  Pt continues to take walks regularly.  She is still at [employer].  She is not sure if she will be a 10 month or a 12 month employee.  She has talked to her coworkers to keep them informed about her surgery; they have a new principal at school who is being supportive of pt as well.  Her new grandson Sydnee Cabal) is now 40 months old and is doing well.  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  Pt shares she has been watching movies, continues to have massages, gets manicures/pedicures, window shops, pt has also been going to breakfast and lunch out as well as, she has also been reading her Bible as well.  Encouraged pt to continue with her self care activities and we will meet in 3 wks for a follow up session.    Interventions: Cognitive Behavioral Therapy  Diagnosis:Generalized anxiety disorder  Plan: Treatment Plan Strengths/Abilities:  Intelligent, Intuitive, Willing to participate in therapy Treatment Preferences:  Outpatient Individual Therapy Statement  of Needs:  Patient is to use CBT, mindfulness and coping skills to help manage and/or decrease symptoms associated with their diagnosis. Symptoms:  Irritable mood, worry, social withdrawal Problems Addressed:  Anxious thoughts, Sadness, Sleep issues, etc. Long Term Goals:  Pt to reduce overall level, frequency, and intensity of the feelings of anxiety as evidenced by decreased irritability, negative self talk, and helpless feelings from 6 to 7 days/week to 0 to 1 days/week, per client report, for at least 3 consecutive months.  Progress: 30% Short Term Goals:  Pt to verbally express understanding of the relationship between feelings of anxiety and  their impact on thinking patterns and behaviors.  Pt to verbalize an understanding of the role that distorted thinking plays in creating fears, excessive worry, and ruminations.  Progress: 30% Target Date:  10/21/2022 Frequency:  Monthly Modality:  Cognitive Behavioral Therapy Interventions by Therapist:  Therapist will use CBT, Mindfulness exercises, Coping skills and Referrals, as needed by client. Client has verbally approved this treatment plan.  Ivan Anchors, [facility]

## 2022-06-23 ENCOUNTER — Ambulatory Visit: Payer: BC Managed Care – PPO | Admitting: Psychology

## 2022-07-07 ENCOUNTER — Ambulatory Visit: Payer: BC Managed Care – PPO | Admitting: Psychology

## 2022-07-07 DIAGNOSIS — F411 Generalized anxiety disorder: Secondary | ICD-10-CM | POA: Diagnosis not present

## 2022-07-07 NOTE — Progress Notes (Signed)
[place] Counselor/Therapist Progress Note  Patient ID: ARLETT GOOLD, MRN: [id number],    Date: 07/07/2022  Time Spent: 30 mins   Treatment Type: Individual Therapy  Reported Symptoms: Pt presents for face to face follow up session via phone due to power outage at her home.  Pt grants verbal consent for visit and states she at home with no one else around.  I shared with pt that I am in my office at home alone as well.    Mental Status Exam: Appearance:  Casual     Behavior: Appropriate  Motor: Normal  Speech/Language:  Clear and Coherent  Affect: Appropriate  Mood: normal  Thought process: normal  Thought content:   WNL  Sensory/Perceptual disturbances:   WNL  Orientation: oriented to person, place, and time/date  Attention: Good  Concentration: Good  Memory: WNL  Fund of knowledge:  Good  Insight:   Good  Judgment:  Good  Impulse Control: Good   Risk Assessment: Danger to Self:  No Self-injurious Behavior: No Danger to Others: No Duty to Warn:no Physical Aggression / Violence:No  Access to Firearms a concern: No  Gang Involvement:No   Subjective: Pt shares that she had carpal tunnel surgery on her right wrist on 7/7 and she started PT for it last week.  She is now going to PT twice a week and she is doing her exercises at home as well.  Pt shares the doctor and therapist are hoping to get her back to work in about 3-4 weeks.  Pt wants to get back to work as soon as she can but she wants to be healed well too.  Pt shares that she "has my days when I am really bored being at home."  Pt shares she tries to walk as many days per week as possible, does small home chores, reads her Bible, does some window shopping , gets massages and pedicures every couple of weeks, meditations, etc to help with the boredom.  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  It felt good for pt to get that positive feedback she  received while at the school.  She has a follow up visit on 10/12 with the surgeon and hopes to be released then to return to work.  Pt shares that her daughters and grandchildren are doing well; her newest grandson Sydnee Cabal) is almost 2 months old and is growing and doing well.  Pt continues to have Trazodone for sleep and uses Cymbalta and Buspar for her depression and anxiety.  Pt has an appt with her PCP on Friday; she will likely have more lab work done at that visit.  Pt has been checking her BP at home and it has been acceptable.  Pt shares she continues to be a 14 month employee and she is not sure if she has to move to a 51 month employee.  Encouraged pt to continue with her self care activities and we will meet in 4 wks for a follow up session.    Interventions: Cognitive Behavioral Therapy  Diagnosis:Generalized anxiety disorder  Plan: Treatment Plan Strengths/Abilities:  Intelligent, Intuitive, Willing to participate in therapy Treatment Preferences:  Outpatient Individual Therapy Statement of Needs:  Patient is to use CBT, mindfulness and coping skills to help manage and/or decrease symptoms associated with their diagnosis. Symptoms:  Irritable mood, worry, social withdrawal Problems Addressed:  Anxious thoughts, Sadness, Sleep issues, etc. Long Term Goals:  Pt to reduce overall level, frequency, and intensity of the feelings of anxiety as evidenced by decreased irritability, negative self talk, and helpless feelings from 6 to 7 days/week to 0 to 1 days/week, per client report, for at least 3 consecutive months.  Progress: 30% Short Term Goals:  Pt to verbally express understanding of the relationship between feelings of anxiety and their impact on thinking patterns and behaviors.  Pt to verbalize an understanding of the role that distorted thinking plays in creating fears, excessive worry, and ruminations.  Progress: 30% Target Date:  10/21/2022 Frequency:  Monthly Modality:  Cognitive  Behavioral Therapy Interventions by Therapist:  Therapist will use CBT, Mindfulness exercises, Coping skills and Referrals, as needed by client. Client has verbally approved this treatment plan.  Ivan Anchors, [facility]

## 2022-07-14 ENCOUNTER — Other Ambulatory Visit: Payer: Self-pay | Admitting: *Deleted

## 2022-07-14 ENCOUNTER — Other Ambulatory Visit: Payer: Self-pay

## 2022-07-14 DIAGNOSIS — Z3046 Encounter for surveillance of implantable subdermal contraceptive: Secondary | ICD-10-CM

## 2022-07-15 ENCOUNTER — Ambulatory Visit (INDEPENDENT_AMBULATORY_CARE_PROVIDER_SITE_OTHER): Payer: BC Managed Care – PPO | Admitting: Obstetrics & Gynecology

## 2022-07-15 ENCOUNTER — Encounter: Payer: Self-pay | Admitting: Obstetrics & Gynecology

## 2022-07-15 VITALS — BP 110/70 | HR 90

## 2022-07-15 DIAGNOSIS — Z01812 Encounter for preprocedural laboratory examination: Secondary | ICD-10-CM

## 2022-07-15 DIAGNOSIS — Z3046 Encounter for surveillance of implantable subdermal contraceptive: Secondary | ICD-10-CM | POA: Diagnosis not present

## 2022-07-15 LAB — PREGNANCY, URINE: Preg Test, Ur: NEGATIVE

## 2022-07-15 NOTE — Progress Notes (Signed)
Megan Lang 1971/06/04 [id number]        51 y.o.  G3P3L3 Married  RP: Encounter to remove and insert Nexplanon  HPI: Well on [place] since 06/2019.  Occasional light menses.  No pelvic pain.  No pain with intercourse. Wants to continue on Nexplanon.  Annual Gyn exam normal in 12/2021.   OB History  Gravida Para Term Preterm AB Living  '3 3 3     3  '$ SAB IAB Ectopic Multiple Live Births          3    # Outcome Date GA Lbr Len/2nd Weight Sex Delivery Anes PTL Lv  3 Term     F Vag-Spont  N LIV  2 Term     F Vag-Spont  N LIV  1 Term     F Vag-Spont  N LIV    Past medical history,surgical history, problem list, medications, allergies, family history and social history were all reviewed and documented in the EPIC chart.   Directed ROS with pertinent positives and negatives documented in the history of present illness/assessment and plan.  Exam:  Vitals:   07/15/22 0833  BP: 110/70  Pulse: 90  SpO2: 99%   General appearance:  Normal  UPT Neg                                                             Nexplanon procedure note (removal)  The patient presented to the office today requesting for removal of her Nexplanon that was placed in the year 06/2019 on her Right arm.   On examination the nexplanon implant was palpated and the distal end  (end  closest to the elbow) was marked. The area was sterilized with Betadine solution. 1% lidocaine was used for local anesthesia and approximately 1 cc  was injected into the site and underneath the skin along the planned insertion tunnel. The local anesthetic was injected under the implant in an effort to keep it  close to the skin surface. Slight pressure pushing downward was made at the proximal end  of the implant in an effort to stabilize it. A bulge appeared indicating the distal end of the implant. A small transverse incision of 2 mm was made at that location. By gently pushing the implant toward the incision, the tip became visible.  Grasping the implant with a curved forcep facilitated in gently removing the implant. Full confirmation of the entire implant which is 4 cm long was inspected and was intact and was shown to the patient and discarded.                                                Nexplanon Procedure Note (insertion)  The preloaded disposable Nexplanon was removed from its sterile casing.  The applicator was held above the needle at the textured surface area. The transparent protector was removed. With a freehand, the skin was stretched around the insertion site with a thumb and index finger. The skin was then entered at the previously made incision with the tip of the needle angled at 30. The Nexplanon applicator was lowered to a horizontal position. While lifting the  skin with the tip of the needle the needle was then slid to its full length. The applicator was kept in this position with the needle inserted to its full length. The purple slider was unlocked by pushing it slightly downward. The slider was fully moved back until it stopped. This allowed the implant to be in the final subdermal position and the needle to be locked inside the body of the applicator. The applicator was then removed. 3 Steri-Strips were applied over the incision, a band-aid and a bandage was placed which the patient is to remove tomorrow. No complications, the patient tolerated procedure well and was released home with instructions.     Assessment/Plan:  51 y.o. G3P3003   1. Encounter for removal and reinsertion of Nexplanon Time to change Nexplanon after 3 years.  Easy removal and insertion of Nexplanon without complications.  Well tolerated by patient.  Post procedure precautions reviewed.  2. Encounter for surveillance of implantable subdermal contraceptive Desires to continue on Nexplanon.  New Nexplanon inserted. - Insertion of implanon rod  3. Encounter for preprocedural laboratory examination UPT Neg - Pregnancy,  urine  Other orders - ALPRAZolam (XANAX) 0.25 MG tablet; Take by mouth daily as needed. - buPROPion (WELLBUTRIN XL) 150 MG 24 hr tablet; Take 150 mg by mouth every morning. - DULoxetine (CYMBALTA) 60 MG capsule; Take 60 mg by mouth 2 (two) times daily. - topiramate (TOPAMAX) 25 MG tablet; Take 25 mg by mouth 2 (two) times daily as needed. - etonogestrel (NEXPLANON) 68 MG IMPL implant; 1 each by Subdermal route once.   Princess Bruins MD, 8:43 AM 07/15/2022

## 2022-07-19 ENCOUNTER — Encounter: Payer: Self-pay | Admitting: Gastroenterology

## 2022-08-04 ENCOUNTER — Ambulatory Visit: Payer: BC Managed Care – PPO | Admitting: Psychology

## 2022-08-04 DIAGNOSIS — F411 Generalized anxiety disorder: Secondary | ICD-10-CM | POA: Diagnosis not present

## 2022-08-04 NOTE — Progress Notes (Signed)
[place] Counselor/Therapist Progress Note  Patient ID: SENIA EVEN, MRN: [id number],    Date: 08/04/2022  Time Spent: 30 mins   Treatment Type: Individual Therapy  Reported Symptoms: Pt presents for face to face follow up session via webex.  Pt grants verbal consent for visit and states she at work with no one else around.  I shared with pt that I am in my office alone as well.    Mental Status Exam: Appearance:  Casual     Behavior: Appropriate  Motor: Normal  Speech/Language:  Clear and Coherent  Affect: Appropriate  Mood: normal  Thought process: normal  Thought content:   WNL  Sensory/Perceptual disturbances:   WNL  Orientation: oriented to person, place, and time/date  Attention: Good  Concentration: Good  Memory: WNL  Fund of knowledge:  Good  Insight:   Good  Judgment:  Good  Impulse Control: Good   Risk Assessment: Danger to Self:  No Self-injurious Behavior: No Danger to Others: No Duty to Warn:no Physical Aggression / Violence:No  Access to Firearms a concern: No  Gang Involvement:No   Subjective: Pt shares that she returned to work this past Friday after her right wrist surgery for Carpal Tunnel Syndrome (7/7).  Pt shares she has made the transition back to work well and that staff were glad to see her back at work.  Pt shares that her coworkers did not have any extra help while she was out until two days before she returned to work.  She was out of work for almost 2 months.  Pt shares she was able to get her housecleaning done before coming back to work.  She was also window shopping while she was out of work as well.  She had a massage this past Saturday; she sees the same massage therapist every time (friend and church member with pt).  Pt shares that several staff have told pt they missed her while she was out and they are glad she is back now; that feels good to pt.  Pt shares her husband is doing fine and is still working in [employer] and  comes home on the weekends.  Her daughters and grand kids are doing well.  Pt shares that she continues to take her medications; she had to reschedule her last appt with her psychiatrist because the doctor had a family emergency.  Pt continues to take her medications (Cymbalta, Buspar, and Trazodone for sleep) and everything is going well for her.  Pt shares that her "sleep has been alright."  She also has another follow up appt in Nov with her surgeon as she still has a little bit of swelling in her wrist; she does not report any pain or discomfort in the wrist at this time.  Pt shares that she did get a bit anxious when she has to go back to the surgeon's office or when she went back to work but she feels she managed those anxious situations well.  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  Pt shares she got her flu shot and her COVID booster while she was out recovering from surgery.  Encouraged pt to continue with her self care activities and we will meet in 4 wks for a follow up session.    Interventions: Cognitive Behavioral Therapy  Diagnosis:Generalized anxiety disorder  Plan: Treatment Plan Strengths/Abilities:  Intelligent, Intuitive, Willing to participate in therapy Treatment Preferences:  Outpatient Individual Therapy Statement of Needs:  Patient is to use CBT, mindfulness and coping skills to help manage and/or decrease symptoms associated with their diagnosis. Symptoms:  Irritable mood, worry, social withdrawal Problems Addressed:  Anxious thoughts, Sadness, Sleep issues, etc. Long Term Goals:  Pt to reduce overall level, frequency, and intensity of the feelings of anxiety as evidenced by decreased irritability, negative self talk, and helpless feelings from 6 to 7 days/week to 0 to 1 days/week, per client report, for at least 3 consecutive months.  Progress: 30% Short Term Goals:  Pt to verbally express understanding of the relationship  between feelings of anxiety and their impact on thinking patterns and behaviors.  Pt to verbalize an understanding of the role that distorted thinking plays in creating fears, excessive worry, and ruminations.  Progress: 30% Target Date:  10/21/2022 Frequency:  Monthly Modality:  Cognitive Behavioral Therapy Interventions by Therapist:  Therapist will use CBT, Mindfulness exercises, Coping skills and Referrals, as needed by client. Client has verbally approved this treatment plan.  Ivan Anchors, [facility]

## 2022-09-01 ENCOUNTER — Ambulatory Visit (INDEPENDENT_AMBULATORY_CARE_PROVIDER_SITE_OTHER): Payer: BC Managed Care – PPO | Admitting: Psychology

## 2022-09-01 DIAGNOSIS — F411 Generalized anxiety disorder: Secondary | ICD-10-CM | POA: Diagnosis not present

## 2022-09-01 NOTE — Progress Notes (Signed)
[place] Counselor/Therapist Progress Note  Patient ID: JANERA PEUGH, MRN: [id number],    Date: 09/01/2022  Time Spent: 30 mins   Treatment Type: Individual Therapy  Reported Symptoms: Pt presents for face to face follow up session via WebEx video.  Pt grants verbal consent for visit and states she at work with no one else around.  I shared with pt that I am in my office alone as well.    Mental Status Exam: Appearance:  Casual     Behavior: Appropriate  Motor: Normal  Speech/Language:  Clear and Coherent  Affect: Appropriate  Mood: normal  Thought process: normal  Thought content:   WNL  Sensory/Perceptual disturbances:   WNL  Orientation: oriented to person, place, and time/date  Attention: Good  Concentration: Good  Memory: WNL  Fund of knowledge:  Good  Insight:   Good  Judgment:  Good  Impulse Control: Good   Risk Assessment: Danger to Self:  No Self-injurious Behavior: No Danger to Others: No Duty to Warn:no Physical Aggression / Violence:No  Access to Firearms a concern: No  Gang Involvement:No   Subjective: Pt shares that she has been doing good since her returned to work from her surgery.  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  The person is helpful to have around.  Her other coworkers seem to appreciate her being back and the work that she does for them.  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  She is also responsible, with the other staff, for cleaning the lunch room in the middle of the day as well.  Pt shares she continues to get massages regularly, she is getting a Designer, multimedia and pedicure this afternoon after work, she has been going out with her husband regularly (he has been working in Technical sales engineer for the past couple of weeks but will be going back to [employer] after Thanksgiving).  They are going to [facility] for Thanksgiving to her  husband's oldest brother's home.  Her daughters and grand kids are doing well.  Pt continues to take her medications regularly and she has a follow up appt on 12/6 with her psychiatrist; she is not having any issues with her medications at this time (Cymbalta, Buspar, and Trazodone for sleep).  Pt has been sleeping OK; she tends to sleep better when her husband is home.  Pt had her last follow up appt with her surgeon last week and he was very pleased with her progress; pt is pleased as well.  Pt shares she has had a couple of anxious episodes since our last session; she managed them with her deep breathing techniques and changing her thoughts to more pleasant ones, knowing that the episodes will end soon.  Pt shares she continues to be a 69 month employee, like she prefers.  Pt shares she has been sick since our last session; she had to go to the doctor and got a steroid shot and an antibiotic.  She is much better now.  Encouraged pt to continue with her self care activities and we will meet in 4 wks for a follow up session.    Interventions: Cognitive Behavioral Therapy  Diagnosis:Generalized anxiety disorder  Plan: Treatment Plan Strengths/Abilities:  Intelligent, Intuitive, Willing to participate in therapy Treatment Preferences:  Outpatient Individual Therapy Statement of Needs:  Patient is to use CBT, mindfulness and coping skills  to help manage and/or decrease symptoms associated with their diagnosis. Symptoms:  Irritable mood, worry, social withdrawal Problems Addressed:  Anxious thoughts, Sadness, Sleep issues, etc. Long Term Goals:  Pt to reduce overall level, frequency, and intensity of the feelings of anxiety as evidenced by decreased irritability, negative self talk, and helpless feelings from 6 to 7 days/week to 0 to 1 days/week, per client report, for at least 3 consecutive months.  Progress: 30% Short Term Goals:  Pt to verbally express understanding of the relationship between feelings  of anxiety and their impact on thinking patterns and behaviors.  Pt to verbalize an understanding of the role that distorted thinking plays in creating fears, excessive worry, and ruminations.  Progress: 30% Target Date:  10/21/2022 Frequency:  Monthly Modality:  Cognitive Behavioral Therapy Interventions by Therapist:  Therapist will use CBT, Mindfulness exercises, Coping skills and Referrals, as needed by client. Client has verbally approved this treatment plan.  Ivan Anchors, [facility]

## 2022-09-29 ENCOUNTER — Ambulatory Visit (INDEPENDENT_AMBULATORY_CARE_PROVIDER_SITE_OTHER): Payer: BC Managed Care – PPO | Admitting: Psychology

## 2022-09-29 DIAGNOSIS — F411 Generalized anxiety disorder: Secondary | ICD-10-CM | POA: Diagnosis not present

## 2022-09-29 NOTE — Progress Notes (Signed)
[facility] Counselor/Therapist Progress Note  Patient ID: Megan Lang, MRN: [id number],    Date: 09/29/2022  Time Spent: 30 mins   Treatment Type: Individual Therapy  Reported Symptoms: Pt presents for face to face follow up session via WebEx video.  Pt grants verbal consent for visit and states she at work with no one else around.  I shared with pt that I am in my office alone as well.    Mental Status Exam: Appearance:  Casual     Behavior: Appropriate  Motor: Normal  Speech/Language:  Clear and Coherent  Affect: Appropriate  Mood: normal  Thought process: normal  Thought content:   WNL  Sensory/Perceptual disturbances:   WNL  Orientation: oriented to person, place, and time/date  Attention: Good  Concentration: Good  Memory: WNL  Fund of knowledge:  Good  Insight:   Good  Judgment:  Good  Impulse Control: Good   Risk Assessment: Danger to Self:  No Self-injurious Behavior: No Danger to Others: No Duty to Warn:no Physical Aggression / Violence:No  Access to Firearms a concern: No  Gang Involvement:No   Subjective: Pt shares that she has been doing good since our last session.  They went to [facility] for Thanksgiving; "I got to see my daughters and my grand kids and my in-laws.  We had a nice trip."  Pt shares that her husband is doing well and is currently working in [facility]; he will be coming back to [place] soon but he is not sure when it will be.  Pt shares she is really busy at work; she is looking forward to the Christmas break.  Her oldest daughter is coming to visit them for Christmas and pt is looking forward to that time.  Her husband is taking the same amount of time off as pt so they can do some things together.  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  Pt shares her wrist is doing pretty well since her surgery; she still has some pain from time to time but  the doctor told her it could take up to a year to completely heal.  Pt shares she continues to get massages regularly, she is getting a Designer, multimedia and pedicure this afternoon after work, also [employer] as well.  Pt shares that her appt on 12/6 had to be rescheduled and the office has not contacted her yet.  She continues to take her Cymbalta, Buspar, and Trazodone for sleep as usual.  Pt shares her sleep continues to be up and down; "sometimes it is my fault for eating or drinking things that do not help with my sleep."  Pt is aware of what works for her and what does not work well; she understands her own responsibility.  Pt shares she has had a couple of anxious episodes since our last session; she managed them with her deep breathing techniques and changing her thoughts to more pleasant ones, knowing that the episodes will end soon.  Encouraged pt to continue with her self care activities and we will meet in 4 wks for a follow up session.    Interventions: Cognitive Behavioral Therapy  Diagnosis:Generalized anxiety disorder  Plan: Treatment Plan Strengths/Abilities:  Intelligent, Intuitive, Willing to participate in therapy Treatment Preferences:  Outpatient Individual Therapy Statement of Needs:  Patient is to use CBT, mindfulness and coping skills to help manage  and/or decrease symptoms associated with their diagnosis. Symptoms:  Irritable mood, worry, social withdrawal Problems Addressed:  Anxious thoughts, Sadness, Sleep issues, etc. Long Term Goals:  Pt to reduce overall level, frequency, and intensity of the feelings of anxiety as evidenced by decreased irritability, negative self talk, and helpless feelings from 6 to 7 days/week to 0 to 1 days/week, per client report, for at least 3 consecutive months.  Progress: 30% Short Term Goals:  Pt to verbally express understanding of the relationship between feelings of anxiety and their impact on thinking patterns and behaviors.  Pt to  verbalize an understanding of the role that distorted thinking plays in creating fears, excessive worry, and ruminations.  Progress: 30% Target Date:  10/21/2022 Frequency:  Monthly Modality:  Cognitive Behavioral Therapy Interventions by Therapist:  Therapist will use CBT, Mindfulness exercises, Coping skills and Referrals, as needed by client. Client has verbally approved this treatment plan.  Ivan Anchors, [facility]

## 2022-11-03 ENCOUNTER — Ambulatory Visit (INDEPENDENT_AMBULATORY_CARE_PROVIDER_SITE_OTHER): Payer: BC Managed Care – PPO | Admitting: Psychology

## 2022-11-03 DIAGNOSIS — F411 Generalized anxiety disorder: Secondary | ICD-10-CM

## 2022-11-03 NOTE — Progress Notes (Signed)
[place] Counselor/Therapist Progress Note  Patient ID: Megan Lang, MRN: [id number],    Date: 11/03/2022  Time Spent: 30 mins   Treatment Type: Individual Therapy  Reported Symptoms: Pt presents for face to face follow up session via WebEx video.  Pt grants verbal consent for visit and states she at work with no one else around.  I shared with pt that I am in my office alone as well.    Mental Status Exam: Appearance:  Casual     Behavior: Appropriate  Motor: Normal  Speech/Language:  Clear and Coherent  Affect: Appropriate  Mood: normal  Thought process: normal  Thought content:   WNL  Sensory/Perceptual disturbances:   WNL  Orientation: oriented to person, place, and time/date  Attention: Good  Concentration: Good  Memory: WNL  Fund of knowledge:  Good  Insight:   Good  Judgment:  Good  Impulse Control: Good   Risk Assessment: Danger to Self:  No Self-injurious Behavior: No Danger to Others: No Duty to Warn:no Physical Aggression / Violence:No  Access to Firearms a concern: No  Gang Involvement:No   Subjective: Pt shares that she has been doing good since our last session.  Pt shares that Christmas was good for them.  Her daughter came for Christmas and was here for about a week.  Her husband "was off the whole time I was off from school."  Pt shares she enjoyed the family time she had.  Self care activities include: massages, manicures, reading her Bible, watching TV and movies.  She gets lots of walking in during her job.  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  Pt shares that her sleep continues to be up and down.  She tries to work and getting as much sleep as she can.  She continues to take her Trazodone to help her fall asleep; she normally wakes up at least once per night to got to the restroom.  Pt shares she has had a couple of anxious episodes since our last session,  "mostly because of the news."  We talked about how to keep involved in her self care activities to combat worry in her life.  She shares that her kids and grand kids are doing pretty well.  Pt shares her wrist is continuing to heal and she is happy with the progress.  She continues to take her Cymbalta, Buspar, and Trazodone for sleep as usual.  Pt shares she tries to be careful about how much caffeine she drinks in the evenings in terms of helping her sleep better.  Her husband continues to work in [facility] and he is still waiting for the job in [place] to begin; he will then be home each evening and pt is looking forward to that.  Encouraged pt to continue with her self care activities and we will meet in 4 wks for a follow up session.    Interventions: Cognitive Behavioral Therapy  Diagnosis:Generalized anxiety disorder  Plan: Treatment Plan Strengths/Abilities:  Intelligent, Intuitive, Willing to participate in therapy Treatment Preferences:  Outpatient Individual Therapy Statement of Needs:  Patient is to use CBT, mindfulness and coping skills to help manage and/or decrease symptoms associated with their diagnosis. Symptoms:  Irritable mood, worry, social withdrawal Problems Addressed:  Anxious thoughts, Sadness, Sleep issues, etc. Long Term Goals:  Pt to reduce overall level, frequency, and intensity of the feelings of anxiety as  evidenced by decreased irritability, negative self talk, and helpless feelings from 6 to 7 days/week to 0 to 1 days/week, per client report, for at least 3 consecutive months.  Progress: 30% Short Term Goals:  Pt to verbally express understanding of the relationship between feelings of anxiety and their impact on thinking patterns and behaviors.  Pt to verbalize an understanding of the role that distorted thinking plays in creating fears, excessive worry, and ruminations.  Progress: 30% Target Date:  10/22/2023 Frequency:  Monthly Modality:  Cognitive Behavioral  Therapy Interventions by Therapist:  Therapist will use CBT, Mindfulness exercises, Coping skills and Referrals, as needed by client. Client has verbally approved this treatment plan.  Ivan Anchors, [facility]

## 2022-11-15 ENCOUNTER — Other Ambulatory Visit: Payer: Self-pay | Admitting: Obstetrics & Gynecology

## 2022-11-15 DIAGNOSIS — Z1231 Encounter for screening mammogram for malignant neoplasm of breast: Secondary | ICD-10-CM

## 2022-12-01 ENCOUNTER — Ambulatory Visit (INDEPENDENT_AMBULATORY_CARE_PROVIDER_SITE_OTHER): Payer: BC Managed Care – PPO | Admitting: Psychology

## 2022-12-01 DIAGNOSIS — F411 Generalized anxiety disorder: Secondary | ICD-10-CM | POA: Diagnosis not present

## 2022-12-01 NOTE — Progress Notes (Signed)
[place] Counselor/Therapist Progress Note  Patient ID: Megan Lang, MRN: [id number],    Date: 12/01/2022  Time Spent: 30 mins   Treatment Type: Individual Therapy  Reported Symptoms: Pt presents for face to face follow up session via WebEx video.  Pt grants verbal consent for visit and states she at work with no one else around.  I shared with pt that I am in my office alone as well.    Mental Status Exam: Appearance:  Casual     Behavior: Appropriate  Motor: Normal  Speech/Language:  Clear and Coherent  Affect: Appropriate  Mood: normal  Thought process: normal  Thought content:   WNL  Sensory/Perceptual disturbances:   WNL  Orientation: oriented to person, place, and time/date  Attention: Good  Concentration: Good  Memory: WNL  Fund of knowledge:  Good  Insight:   Good  Judgment:  Good  Impulse Control: Good   Risk Assessment: Danger to Self:  No Self-injurious Behavior: No Danger to Others: No Duty to Warn:no Physical Aggression / Violence:No  Access to Firearms a concern: No  Gang Involvement:No   Subjective: Pt shares that she has been doing good since our last session; "I can't complain."  Pt shares she has been very intentional about her self care activities; she has been getting manicures and pedicures and massages every 2 wks; she has also been coloring some, working on puzzles, reading her Bible, etc.  Her husband is still working in [facility] but comes home on the weekends.  Pt shares her daughters and grand children have been OK but some of them have had respiratory viruses.  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  Pt shares she has not been sleeping well at night, "probably because I have snacking too late at night.  I need to cut back on that some."  She continues to take her Trazodone to help her fall asleep; "I don't like the crazy dreams that come with the medicine."   Encouraged pt to talk with her psychiatrist about these dreams to see if she can help with the dosage or maybe switching to another medicine.  Pt shares that she has started [facility] to help her lose some weight; she just started it last week so she has not yet seen results.  She gets lots of walking in during her job.  She will get to see her husband today for Valentine's Day and he already gave her a dozen roses already this morning and she appreciated them.  She continues to take her Cymbalta, Buspar, and Trazodone for sleep as usual.  Pt shares she tries to be careful about how much caffeine she drinks in the evenings in terms of helping her sleep better.  Her husband continues to work in [facility] and he is still waiting for the job in [place] to begin; he will then be home each evening and pt is looking forward to that.  Encouraged pt to continue with her self care activities and we will meet in 4 wks for a follow up session.    Interventions: Cognitive Behavioral Therapy  Diagnosis:Generalized anxiety disorder  Plan: Treatment Plan Strengths/Abilities:  Intelligent, Intuitive, Willing to participate in therapy Treatment Preferences:  Outpatient Individual Therapy Statement of Needs:  Patient is to use CBT, mindfulness and coping skills to help manage and/or decrease symptoms associated with their diagnosis. Symptoms:  Irritable mood, worry, social  withdrawal Problems Addressed:  Anxious thoughts, Sadness, Sleep issues, etc. Long Term Goals:  Pt to reduce overall level, frequency, and intensity of the feelings of anxiety as evidenced by decreased irritability, negative self talk, and helpless feelings from 6 to 7 days/week to 0 to 1 days/week, per client report, for at least 3 consecutive months.  Progress: 30% Short Term Goals:  Pt to verbally express understanding of the relationship between feelings of anxiety and their impact on thinking patterns and behaviors.  Pt to verbalize an understanding of the role  that distorted thinking plays in creating fears, excessive worry, and ruminations.  Progress: 30% Target Date:  10/22/2023 Frequency:  Monthly Modality:  Cognitive Behavioral Therapy Interventions by Therapist:  Therapist will use CBT, Mindfulness exercises, Coping skills and Referrals, as needed by client. Client has verbally approved this treatment plan.  Ivan Anchors, [facility]

## 2022-12-12 ENCOUNTER — Other Ambulatory Visit (HOSPITAL_BASED_OUTPATIENT_CLINIC_OR_DEPARTMENT_OTHER): Payer: Self-pay

## 2022-12-12 MED ORDER — WEGOVY 0.25 MG/0.5ML ~~LOC~~ SOAJ
0.2500 mg | SUBCUTANEOUS | 0 refills | Status: DC
  Filled 2022-12-12 – 2022-12-21 (×2): qty 2, 28d supply, fill #0

## 2022-12-13 ENCOUNTER — Other Ambulatory Visit (HOSPITAL_BASED_OUTPATIENT_CLINIC_OR_DEPARTMENT_OTHER): Payer: Self-pay

## 2022-12-13 ENCOUNTER — Other Ambulatory Visit: Payer: Self-pay

## 2022-12-21 ENCOUNTER — Other Ambulatory Visit (HOSPITAL_BASED_OUTPATIENT_CLINIC_OR_DEPARTMENT_OTHER): Payer: Self-pay

## 2022-12-29 ENCOUNTER — Ambulatory Visit: Payer: BC Managed Care – PPO | Admitting: Psychology

## 2022-12-30 ENCOUNTER — Other Ambulatory Visit (HOSPITAL_BASED_OUTPATIENT_CLINIC_OR_DEPARTMENT_OTHER): Payer: Self-pay

## 2023-01-13 ENCOUNTER — Encounter: Payer: Self-pay | Admitting: Obstetrics & Gynecology

## 2023-01-13 ENCOUNTER — Ambulatory Visit (INDEPENDENT_AMBULATORY_CARE_PROVIDER_SITE_OTHER): Payer: BC Managed Care – PPO | Admitting: Obstetrics & Gynecology

## 2023-01-13 VITALS — BP 118/80 | HR 94 | Ht 62.25 in | Wt 184.0 lb

## 2023-01-13 DIAGNOSIS — E6609 Other obesity due to excess calories: Secondary | ICD-10-CM

## 2023-01-13 DIAGNOSIS — N951 Menopausal and female climacteric states: Secondary | ICD-10-CM

## 2023-01-13 DIAGNOSIS — Z01419 Encounter for gynecological examination (general) (routine) without abnormal findings: Secondary | ICD-10-CM

## 2023-01-13 DIAGNOSIS — Z3046 Encounter for surveillance of implantable subdermal contraceptive: Secondary | ICD-10-CM

## 2023-01-13 NOTE — Progress Notes (Signed)
Megan Lang 04/10/1971 [id number]   History:    52 y.o.  G3P3L3 Married.  3 daughters 28-30-32.  Has 7 grand-children.   RP:  Established patient presenting for annual gyn exam    HPI: Well on Nexplanon since 07/15/2022.  Occasional light menses.  No pelvic pain.  No pain with intercourse. Last Pap Neg 12/2021.  Breasts normal.  Mammo Neg 01/2022.  Body mass index 33.38. Walks regularly.  Health labs with family physician. Colono 05/2016.  Past medical history,surgical history, family history and social history were all reviewed and documented in the EPIC chart.  Gynecologic History No LMP recorded. Patient has had an implant.  Obstetric History OB History  Gravida Para Term Preterm AB Living  3 3 3     3   SAB IAB Ectopic Multiple Live Births          3    # Outcome Date GA Lbr Len/2nd Weight Sex Delivery Anes PTL Lv  3 Term     F Vag-Spont  N LIV  2 Term     F Vag-Spont  N LIV  1 Term     F Vag-Spont  N LIV     ROS: A ROS was performed and pertinent positives and negatives are included in the history. GENERAL: No fevers or chills. HEENT: No change in vision, no earache, sore throat or sinus congestion. NECK: No pain or stiffness. CARDIOVASCULAR: No chest pain or pressure. No palpitations. PULMONARY: No shortness of breath, cough or wheeze. GASTROINTESTINAL: No abdominal pain, nausea, vomiting or diarrhea, melena or bright red blood per rectum. GENITOURINARY: No urinary frequency, urgency, hesitancy or dysuria. MUSCULOSKELETAL: No joint or muscle pain, no back pain, no recent trauma. DERMATOLOGIC: No rash, no itching, no lesions. ENDOCRINE: No polyuria, polydipsia, no heat or cold intolerance. No recent change in weight. HEMATOLOGICAL: No anemia or easy bruising or bleeding. NEUROLOGIC: No headache, seizures, numbness, tingling or weakness. PSYCHIATRIC: No depression, no loss of interest in normal activity or change in sleep pattern.     Exam:   BP 118/80   Pulse 94   Ht 5'  2.25" (1.581 m)   Wt 184 lb (83.5 kg)   SpO2 99%   BMI 33.38 kg/m   Body mass index is 33.38 kg/m.  General appearance : Well developed well nourished female. No acute distress HEENT: Eyes: no retinal hemorrhage or exudates,  Neck supple, trachea midline, no carotid bruits, no thyroidmegaly Lungs: Clear to auscultation, no rhonchi or wheezes, or rib retractions  Heart: Regular rate and rhythm, no murmurs or gallops Breast:Examined in sitting and supine position were symmetrical in appearance, no palpable masses or tenderness,  no skin retraction, no nipple inversion, no nipple discharge, no skin discoloration, no axillary or supraclavicular lymphadenopathy Abdomen: no palpable masses or tenderness, no rebound or guarding Extremities: no edema or skin discoloration or tenderness  Pelvic: Vulva: Normal             Vagina: No gross lesions or discharge  Cervix: No gross lesions or discharge  Uterus  AV, normal size, shape and consistency, non-tender and mobile  Adnexa  Without masses or tenderness  Anus: Normal   Assessment/Plan:  51 y.o. female for annual exam   1. Well female exam with routine gynecological exam Well on Nexplanon since 07/15/2022.  Occasional light menses.  No pelvic pain.  No pain with intercourse. Last Pap Neg 12/2021.  Breasts normal.  Mammo Neg 01/2022.  Body mass index 33.38. Walks regularly.  Health labs with family physician. Colono 05/2016.  2. Encounter for surveillance of implantable subdermal contraceptive Well on [place] since 07/15/2022.  Occasional light menses.  No pelvic pain.  No pain with intercourse.  3. Hot flushes, perimenopausal Especially night sweats.  Will observe at this time.  4. Class 1 obesity due to excess calories without serious comorbidity with body mass index (BMI) of 33.0 to 33.9 in adult Body mass index 33.38. Recommend a lower calorie/carb diet.  Walks regularly.   Other orders - prazosin (MINIPRESS) 1 MG capsule; Take 1 mg by  mouth at bedtime. - buPROPion (WELLBUTRIN XL) 300 MG 24 hr tablet; Take 300 mg by mouth every morning.   Princess Bruins MD, 8:51 AM

## 2023-01-21 ENCOUNTER — Ambulatory Visit
Admission: RE | Admit: 2023-01-21 | Discharge: 2023-01-21 | Disposition: A | Discharge status: Home / Self Care | Payer: BC Managed Care – PPO | Source: Ambulatory Visit | Attending: Obstetrics & Gynecology | Admitting: Obstetrics & Gynecology

## 2023-01-21 DIAGNOSIS — Z1231 Encounter for screening mammogram for malignant neoplasm of breast: Secondary | ICD-10-CM

## 2023-03-19 HISTORY — PX: HAND SURGERY: SHX662

## 2023-04-05 ENCOUNTER — Other Ambulatory Visit: Payer: Self-pay | Admitting: Orthopedic Surgery

## 2023-04-20 ENCOUNTER — Encounter: Payer: Self-pay | Admitting: Gastroenterology

## 2023-04-27 ENCOUNTER — Encounter: Payer: Self-pay | Admitting: Gastroenterology

## 2023-05-25 ENCOUNTER — Ambulatory Visit (AMBULATORY_SURGERY_CENTER): Payer: BC Managed Care – PPO

## 2023-05-25 VITALS — Ht 62.0 in | Wt 187.0 lb

## 2023-05-25 DIAGNOSIS — Z86010 Personal history of colonic polyps: Secondary | ICD-10-CM

## 2023-05-25 MED ORDER — NA SULFATE-K SULFATE-MG SULF 17.5-3.13-1.6 GM/177ML PO SOLN: 1.0000 | Freq: Once | ORAL | 0 refills | Status: AC

## 2023-05-25 NOTE — Progress Notes (Signed)
Pre visit completed via phone call; Patient verified name, DOB, and address;  No egg or soy allergy known to patient;  No issues known to pt with past sedation with any surgeries or procedures; Patient denies ever being told they had issues or difficulty with intubation;  No FH of Malignant Hyperthermia; Pt is not on diet pills; Pt is not on home 02;  Pt is not on blood thinners;  Pt denies issues with constipation;  No A fib or A flutter;  Have any cardiac testing pending--NO Insurance verified during PV appt--- BCBS State  Pt can ambulate without assistance;  Pt denies use of chewing tobacco Discussed diabetic/weight loss medication holds; Discussed NSAID holds; Checked BMI to be less than 50; Pt instructed to use Singlecare.com or GoodRx for a price reduction on prep  Patient's chart reviewed by Cathlyn Parsons CNRA prior to previsit and patient appropriate for the [facility].  Pre visit completed and red dot placed by patient's name on their procedure day (on provider's schedule).    Instructions placed at the front desk on the 2nd floor for patient to pick up per her request; GoodRx [facility] coupon also placed with prep instructions;

## 2023-06-13 ENCOUNTER — Encounter: Payer: Self-pay | Admitting: Gastroenterology

## 2023-06-19 DIAGNOSIS — R7303 Prediabetes: Secondary | ICD-10-CM

## 2023-06-19 HISTORY — DX: Prediabetes: R73.03

## 2023-06-27 ENCOUNTER — Telehealth: Payer: Self-pay | Admitting: Gastroenterology

## 2023-06-27 NOTE — Telephone Encounter (Signed)
Inbound call from patient wishing to go over prep instructions for tomorrow's 9/10 procedure. States she never received prep instructions through the mail after 8/7 pre visit. Patient requesting a call back. Please advise, thank you.

## 2023-06-27 NOTE — Telephone Encounter (Signed)
Returned pt's call. Reviewed instructions in detail over the phone, also sent instructions to pt via MyChart (states she has access). Verified with pt about Wegovy and she states she hasn't taken it in about two weeks. Instructed pt to continue to hold this medication until after her procedure as it would be a reason for cancellation. Pt verbalized understanding.

## 2023-06-28 ENCOUNTER — Ambulatory Visit (AMBULATORY_SURGERY_CENTER): Payer: BC Managed Care – PPO | Admitting: Gastroenterology

## 2023-06-28 ENCOUNTER — Encounter: Payer: Self-pay | Admitting: Gastroenterology

## 2023-06-28 VITALS — BP 146/92 | HR 78 | Temp 97.8°F | Resp 18 | Ht 62.0 in | Wt 187.0 lb

## 2023-06-28 DIAGNOSIS — D124 Benign neoplasm of descending colon: Secondary | ICD-10-CM | POA: Diagnosis not present

## 2023-06-28 DIAGNOSIS — Z86010 Personal history of colonic polyps: Secondary | ICD-10-CM | POA: Diagnosis not present

## 2023-06-28 DIAGNOSIS — D175 Benign lipomatous neoplasm of intra-abdominal organs: Secondary | ICD-10-CM | POA: Diagnosis not present

## 2023-06-28 DIAGNOSIS — Z09 Encounter for follow-up examination after completed treatment for conditions other than malignant neoplasm: Secondary | ICD-10-CM | POA: Diagnosis present

## 2023-06-28 HISTORY — PX: COLONOSCOPY: SHX174

## 2023-06-28 MED ORDER — SODIUM CHLORIDE 0.9 % IV SOLN: 500.0000 mL | Freq: Once | INTRAVENOUS | Status: DC

## 2023-06-28 NOTE — Progress Notes (Signed)
Pt's states no medical or surgical changes since previsit or office visit. 

## 2023-06-28 NOTE — Progress Notes (Signed)
Called to room to assist during endoscopic procedure.  Patient ID and intended procedure confirmed with present staff. Received instructions for my participation in the procedure from the performing physician.  

## 2023-06-28 NOTE — Progress Notes (Signed)
Vss nad trans to pacu 

## 2023-06-28 NOTE — Patient Instructions (Signed)
YOU HAD AN ENDOSCOPIC PROCEDURE TODAY AT [facility]:   Refer to the procedure report that was given to you for any specific questions about what was found during the examination.  If the procedure report does not answer your questions, please call your gastroenterologist to clarify.  If you requested that your care partner not be given the details of your procedure findings, then the procedure report has been included in a sealed envelope for you to review at your convenience later.  **Handouts given on polyps and diverticulosis**  YOU SHOULD EXPECT: Some feelings of bloating in the abdomen. Passage of more gas than usual.  Walking can help get rid of the air that was put into your GI tract during the procedure and reduce the bloating. If you had a lower endoscopy (such as a colonoscopy or flexible sigmoidoscopy) you may notice spotting of blood in your stool or on the toilet paper. If you underwent a bowel prep for your procedure, you may not have a normal bowel movement for a few days.  Please Note:  You might notice some irritation and congestion in your nose or some drainage.  This is from the oxygen used during your procedure.  There is no need for concern and it should clear up in a day or so.  SYMPTOMS TO REPORT IMMEDIATELY:  Following lower endoscopy (colonoscopy or flexible sigmoidoscopy):  Excessive amounts of blood in the stool  Significant tenderness or worsening of abdominal pains  Swelling of the abdomen that is new, acute  Fever of 100F or higher  For urgent or emergent issues, a gastroenterologist can be reached at any hour by calling [phone]. Do not use MyChart messaging for urgent concerns.    DIET:  We do recommend a small meal at first, but then you may proceed to your regular diet.  Drink plenty of fluids but you should avoid alcoholic beverages for 24 hours.  ACTIVITY:  You should plan to take it easy for the rest of today and you should NOT DRIVE  or use heavy machinery until tomorrow (because of the sedation medicines used during the test).    FOLLOW UP: Our staff will call the number listed on your records the next business day following your procedure.  We will call around 7:15- 8:00 am to check on you and address any questions or concerns that you may have regarding the information given to you following your procedure. If we do not reach you, we will leave a message.     If any biopsies were taken you will be contacted by phone or by letter within the next 1-3 weeks.  Please call us at [phone] if you have not heard about the biopsies in 3 weeks.    SIGNATURES/CONFIDENTIALITY: You and/or your care partner have signed paperwork which will be entered into your electronic medical record.  These signatures attest to the fact that that the information above on your After Visit Summary has been reviewed and is understood.  Full responsibility of the confidentiality of this discharge information lies with you and/or your care-partner.

## 2023-06-28 NOTE — Op Note (Signed)
[facility] Patient Name: Megan Lang Procedure Date: 06/28/2023 7:21 AM MRN: [id number] Endoscopist: Viviann Spare P. Adela Lank , MD, [id number] Age: 52 Referring MD:  Date of Birth: [birth year] Gender: Female Account #: [id number] Procedure:                Colonoscopy Indications:              High risk colon cancer surveillance: Personal                            history of colonic polyps - one small adenoma                            removed 05/2016 Medicines:                Monitored Anesthesia Care Procedure:                Pre-Anesthesia Assessment:                           - Prior to the procedure, a History and Physical                            was performed, and patient medications and                            allergies were reviewed. The patient's tolerance of                            previous anesthesia was also reviewed. The risks                            and benefits of the procedure and the sedation                            options and risks were discussed with the patient.                            All questions were answered, and informed consent                            was obtained. Prior Anticoagulants: The patient has                            taken no anticoagulant or antiplatelet agents. ASA                            Grade Assessment: III - A patient with severe                            systemic disease. After reviewing the risks and                            benefits, the patient was deemed in satisfactory  condition to undergo the procedure.                           After obtaining informed consent, the colonoscope                            was passed under direct vision. Throughout the                            procedure, the patient's blood pressure, pulse, and                            oxygen saturations were monitored continuously. The                            CF [id number] #[id number] was introduced through  the anus                            and advanced to the the cecum, identified by                            appendiceal orifice and ileocecal valve. The                            colonoscopy was performed without difficulty. The                            patient tolerated the procedure well. The quality                            of the bowel preparation was good. The ileocecal                            valve, appendiceal orifice, and rectum were                            photographed. Scope In: 8:04:41 AM Scope Out: 8:20:08 AM Scope Withdrawal Time: 0 hours 10 minutes 59 seconds  Total Procedure Duration: 0 hours 15 minutes 27 seconds  Findings:                 The perianal and digital rectal examinations were                            normal.                           A 3 mm polyp was found in the descending colon. The                            polyp was sessile. The polyp was removed with a                            cold snare. Resection and retrieval were complete.  Multiple medium-mouthed diverticula were found in                            the entire colon.                           The exam was otherwise without abnormality. Complications:            No immediate complications. Estimated blood loss:                            Minimal. Estimated Blood Loss:     Estimated blood loss was minimal. Impression:               - One 3 mm polyp in the descending colon, removed                            with a cold snare. Resected and retrieved.                           - Diverticulosis in the entire examined colon.                           - The examination was otherwise normal. Recommendation:           - Patient has a contact number available for                            emergencies. The signs and symptoms of potential                            delayed complications were discussed with the                            patient. Return to normal  activities tomorrow.                            Written discharge instructions were provided to the                            patient.                           - Resume previous diet.                           - Continue present medications.                           - Await pathology results. Viviann Spare P. Chardonnay Holzmann, MD 06/28/2023 8:25:00 AM This report has been signed electronically.

## 2023-06-28 NOTE — Progress Notes (Signed)
[facility] History and Physical   Primary Care Physician:  [facility]   Reason for Procedure:   History of colon polyps  Plan:    colonoscopy     HPI: Megan Lang is a 52 y.o. female  here for colonoscopy surveillance - one adenoma removed 05/2016.   Patient denies any bowel symptoms at this time. No family history of colon cancer known. Otherwise feels well without any cardiopulmonary symptoms.   I have discussed risks / benefits of anesthesia and endoscopic procedure with Abby Potash Croker and they wish to proceed with the exams as outlined today.    Past Medical History:  Diagnosis Date   Acid reflux    with certain foods/ takes OTC PRN meds   Allergy    Aortic aneurysm (HCC)    Arthritis    generalized   Asthma    PRN inhaler   Cardiomegaly    Carpal tunnel syndrome    Depression    on meds   Gastric polyp    GERD (gastroesophageal reflux disease) 01/23/2016   Hiatal hernia    Hypertension    on meds   Normal spontaneous vaginal delivery    3   Prediabetes 06/2023    Past Surgical History:  Procedure Laterality Date   ANTERIOR CERVICAL DECOMPRESSION/DISCECTOMY FUSION 4 LEVELS N/A 08/31/2018   Procedure: ANTERIOR CERVICAL DECOMPRESSION/DISCECTOMY FUSION C3-7;  Surgeon: Venita Lick, MD;  Location: [facility] OR;  Service: Orthopedics;  Laterality: N/A;  6 hrs   CARPAL TUNNEL RELEASE Left 2009   CARPAL TUNNEL RELEASE Right 04/2022   COLONOSCOPY  2017   SA-MAC-suprep(good)-HPP/ TA x1/5 yr recall   COLONOSCOPY  06/28/2023   HAND SURGERY Right 03/2023   NASAL SINUS SURGERY     DEC 15,2016   nexplanon insertion     inserted 07-03-2019   ROTATOR CUFF REPAIR Left 04/14/2018   WISDOM TOOTH EXTRACTION      Prior to Admission medications   Medication Sig Start Date End Date Taking? Authorizing Provider  amLODipine (NORVASC) 10 MG tablet Take 10 mg by mouth daily. 12/07/21  Yes [provider]  bisoprolol (ZEBETA) 5 MG tablet  Take 5 mg by mouth daily. 10/25/21  Yes [provider]  buPROPion (WELLBUTRIN XL) 300 MG 24 hr tablet Take 300 mg by mouth every morning.   Yes [provider]  busPIRone (BUSPAR) 15 MG tablet Take 1 tablet (15 mg total) by mouth 2 (two) times daily. Patient taking differently: Take 30 mg by mouth daily. 08/09/18  Yes Oneta Rack, NP  DULoxetine (CYMBALTA) 60 MG capsule Take 60 mg by mouth 2 (two) times daily. 04/21/22  Yes [provider]  etonogestrel (NEXPLANON) 68 MG IMPL implant 1 each by Subdermal route once. 07/15/22  Yes [provider]  Multiple Vitamin (MULTIVITAMIN PO) Take 1 tablet by mouth daily at 6 (six) AM.   Yes [provider]  Na Sulfate-K Sulfate-Mg Sulf 17.5-3.13-1.6 GM/177ML SOLN Take 1 kit by mouth once.   Yes [provider]  traZODone (DESYREL) 50 MG tablet Take 50 mg by mouth at bedtime. 05/11/21  Yes [provider]  Vitamin D, Ergocalciferol, (DRISDOL) 1.25 MG (50000 UNIT) CAPS capsule Take 50,000 Units by mouth once a week. 04/21/21  Yes [provider]  albuterol (PROVENTIL HFA;VENTOLIN HFA) 108 (90 Base) MCG/ACT inhaler Inhale 1-2 puffs into the lungs every 6 (six) hours as needed for wheezing or shortness of breath. 09/03/16   Blane Ohara, MD  azelastine (ASTELIN) 0.1 % nasal spray Place 2 sprays into both nostrils 2 (two) times daily. Use in each nostril as directed Patient taking differently: Place 2 sprays into both nostrils 2 (two) times daily as needed for rhinitis or allergies. Use in each nostril as directed 11/16/17   Porfirio Oar, PA  Semaglutide-Weight Management (WEGOVY) 0.5 MG/0.5ML SOAJ Inject 0.5 mg into the skin once a week.    [provider]  topiramate (TOPAMAX) 25 MG tablet Take 25 mg by mouth 2 (two) times daily as needed. 07/09/22   [provider]    Current Outpatient Medications  Medication Sig Dispense Refill   amLODipine (NORVASC) 10 MG tablet Take 10  mg by mouth daily.     bisoprolol (ZEBETA) 5 MG tablet Take 5 mg by mouth daily.     buPROPion (WELLBUTRIN XL) 300 MG 24 hr tablet Take 300 mg by mouth every morning.     busPIRone (BUSPAR) 15 MG tablet Take 1 tablet (15 mg total) by mouth 2 (two) times daily. (Patient taking differently: Take 30 mg by mouth daily.) 60 tablet 0   DULoxetine (CYMBALTA) 60 MG capsule Take 60 mg by mouth 2 (two) times daily.     etonogestrel (NEXPLANON) 68 MG IMPL implant 1 each by Subdermal route once.     Multiple Vitamin (MULTIVITAMIN PO) Take 1 tablet by mouth daily at 6 (six) AM.     Na Sulfate-K Sulfate-Mg Sulf 17.5-3.13-1.6 GM/177ML SOLN Take 1 kit by mouth once.     traZODone (DESYREL) 50 MG tablet Take 50 mg by mouth at bedtime.     Vitamin D, Ergocalciferol, (DRISDOL) 1.25 MG (50000 UNIT) CAPS capsule Take 50,000 Units by mouth once a week.     albuterol (PROVENTIL HFA;VENTOLIN HFA) 108 (90 Base) MCG/ACT inhaler Inhale 1-2 puffs into the lungs every 6 (six) hours as needed for wheezing or shortness of breath. 1 Inhaler 0   azelastine (ASTELIN) 0.1 % nasal spray Place 2 sprays into both nostrils 2 (two) times daily. Use in each nostril as directed (Patient taking differently: Place 2 sprays into both nostrils 2 (two) times daily as needed for rhinitis or allergies. Use in each nostril as directed) 30 mL 0   Semaglutide-Weight Management (WEGOVY) 0.5 MG/0.5ML SOAJ Inject 0.5 mg into the skin once a week.     topiramate (TOPAMAX) 25 MG tablet Take 25 mg by mouth 2 (two) times daily as needed.     Current Facility-Administered Medications  Medication Dose Route Frequency Provider Last Rate Last Admin   0.9 %  sodium chloride infusion  500 mL Intravenous Once Myndi Wamble, Willaim Rayas, MD        Allergies as of 06/28/2023 - Review Complete 06/28/2023  Allergen Reaction Noted   Moxifloxacin Swelling 01/23/2016   Clindamycin Diarrhea and Other (See Comments) 01/23/2016   Codeine Other (See Comments) 06/02/2016    Levofloxacin Other (See Comments) 01/23/2016   Metronidazole Diarrhea 01/23/2016   Minocin [minocycline hcl] Other (See Comments) 07/28/2015   Penicillins Rash 06/16/2013    Family History  Problem Relation Age of Onset   Hypertension Mother    Liver cancer Father 54 - 20   Kidney disease Father    Liver disease Father        Unknown diagnosis   Hypertension Sister    Hypertension Brother    Drug abuse Brother    Breast cancer Maternal Grandmother    Colon polyps Neg Hx    Esophageal cancer Neg Hx  Stomach cancer Neg Hx    Rectal cancer Neg Hx     Social History   Socioeconomic History   Marital status: Married    Spouse name: Not on file   Number of children: 3   Years of education: Not on file   Highest education level: Not on file  Occupational History   Occupation: CUSTODIAL    Employer: [employer]  Tobacco Use   Smoking status: Never   Smokeless tobacco: Never  Vaping Use   Vaping status: Never Used  Substance and Sexual Activity   Alcohol use: No   Drug use: No   Sexual activity: Yes    Partners: Male    Birth control/protection: Implant    Comment: 1st intercourse- 16, nexplanon  Other Topics Concern   Not on file  Social History Narrative   Epworth Sleepiness Scale = 8 (as of 10/28/2015).  Caffeine 1 glass daily.  Lives home, with husband,  Works for [employer].  Education HS.  3 children.   Social Determinants of Health   Financial Resource Strain: Not on file  Food Insecurity: Not on file  Transportation Needs: Not on file  Physical Activity: Not on file  Stress: Not on file  Social Connections: Not on file  Intimate Partner Violence: Not on file    Review of Systems: All other review of systems negative except as mentioned in the HPI.  Physical Exam: Vital signs BP 137/88   Pulse 90   Temp 97.8 F (36.6 C) (Temporal)   Ht 5\' 2"  (1.575 m)   Wt 187 lb (84.8 kg)   SpO2 100%   BMI 34.20 kg/m   General:   Alert,   Well-developed, pleasant and cooperative in NAD Lungs:  Clear throughout to auscultation.   Heart:  Regular rate and rhythm Abdomen:  Soft, nontender and nondistended.   Neuro/Psych:  Alert and cooperative. Normal mood and affect. A and O x 3  Harlin Rain, MD [facility]

## 2023-06-29 ENCOUNTER — Telehealth: Payer: Self-pay

## 2023-06-29 NOTE — Telephone Encounter (Signed)
  Follow up Call-     06/28/2023    7:14 AM  Call back number  Post procedure Call Back phone  # [phone]  Permission to leave phone message Yes     Patient questions:  Do you have a fever, pain , or abdominal swelling? No. Pain Score  0 *  Have you tolerated food without any problems? Yes.    Have you been able to return to your normal activities? Yes.    Do you have any questions about your discharge instructions: Diet   No. Medications  No. Follow up visit  No.  Do you have questions or concerns about your Care? No.  Actions: * If pain score is 4 or above: No action needed, pain <4.

## 2023-06-30 LAB — SURGICAL PATHOLOGY

## 2023-07-26 ENCOUNTER — Other Ambulatory Visit: Payer: Self-pay | Admitting: Cardiothoracic Surgery

## 2023-07-26 ENCOUNTER — Encounter: Payer: Self-pay | Admitting: Cardiothoracic Surgery

## 2023-07-26 DIAGNOSIS — I7121 Aneurysm of the ascending aorta, without rupture: Secondary | ICD-10-CM

## 2023-08-05 ENCOUNTER — Ambulatory Visit
Admission: RE | Admit: 2023-08-05 | Discharge: 2023-08-05 | Disposition: A | Discharge status: Home / Self Care | Payer: BC Managed Care – PPO | Source: Ambulatory Visit | Attending: Family Medicine | Admitting: Family Medicine

## 2023-08-05 VITALS — BP 142/91 | HR 95 | Temp 98.1°F | Resp 17

## 2023-08-05 DIAGNOSIS — J0100 Acute maxillary sinusitis, unspecified: Secondary | ICD-10-CM

## 2023-08-05 MED ORDER — DOXYCYCLINE HYCLATE 100 MG PO CAPS: 100.0000 mg | ORAL_CAPSULE | Freq: Two times a day (BID) | ORAL | 0 refills | Status: DC

## 2023-08-05 NOTE — ED Triage Notes (Signed)
Pt presents with c/o seasonal allergies flare up.   States she has been sneezing a lot, she has irritation in eyes and a cough x 1 day.  Home interventions: Robitussin DM, cough drops and hot teas.

## 2023-08-05 NOTE — Discharge Instructions (Signed)
You were diagnosed with a sinus infection today.  I have sent out an antibiotic to take twice/day x 10 days.  Please continue your over the counter medications, including flonase daily.  If you are not improving or worsening then please return for re-evaluation.

## 2023-08-05 NOTE — ED Provider Notes (Signed)
[facility]    CSN: [id number] Arrival date & time: 08/05/23  1055      History   Chief Complaint Chief Complaint  Patient presents with   Nasal Congestion    Entered by patient    HPI Megan Lang is a 52 y.o. female.   Patient is here for URI symptoms.  She lost her voice 2 days ago. Cough that started today, with mucous.  Having sinus pain/pressure x 2 days, primarily at the left maxillary sinus, with sinus congestion and drainage x 4 days.  + sneezing.  No fevers/chills.  No wheezing or sob.  She has used otc medications without much help.  She has h/o sinus infections, had surgery in the past.        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   Keloid of skin 10/08/2019   Cervical myelopathy (HCC) 08/31/2018   Spinal stenosis in cervical region 08/29/2018   Cervicalgia 08/29/2018   Shoulder impingement syndrome, left 12/19/2017   Idiopathic scoliosis and kyphoscoliosis 07/01/2017   Chronic bilateral back pain 07/01/2017   Nexplanon in place 07/29/2016   Cardiomegaly 07/01/2016   History of aortic aneurysm 07/01/2016   GERD (gastroesophageal reflux disease) 01/23/2016   Dyspnea 10/27/2015   Costochondritis 08/26/2015   Obesity 07/01/2015   Upper airway cough syndrome 06/30/2015   History of shingles 01/26/2015   Family history of colon cancer 10/27/2011   Essential hypertension 10/27/2011    Past Surgical History:  Procedure  Laterality Date   ANTERIOR CERVICAL DECOMPRESSION/DISCECTOMY FUSION 4 LEVELS N/A 08/31/2018   Procedure: ANTERIOR CERVICAL DECOMPRESSION/DISCECTOMY FUSION C3-7;  Surgeon: Venita Lick, MD;  Location: [facility] OR;  Service: Orthopedics;  Laterality: N/A;  6 hrs   CARPAL TUNNEL RELEASE Left 2009   CARPAL TUNNEL RELEASE Right 04/2022   COLONOSCOPY  2017   SA-MAC-suprep(good)-HPP/ TA x1/5 yr recall   COLONOSCOPY  06/28/2023   HAND SURGERY Right 03/2023   NASAL SINUS SURGERY     DEC 15,2016   nexplanon insertion     inserted 07-03-2019   ROTATOR CUFF REPAIR Left 04/14/2018   WISDOM TOOTH EXTRACTION      OB History     Gravida  3   Para  3   Term  3   Preterm      AB      Living  3      SAB      IAB      Ectopic      Multiple      Live Births  3            Home Medications    Prior to Admission medications   Medication Sig Start Date End Date Taking? Authorizing Provider  albuterol (PROVENTIL HFA;VENTOLIN HFA) 108 (90 Base) MCG/ACT inhaler Inhale 1-2 puffs into  the lungs every 6 (six) hours as needed for wheezing or shortness of breath. 09/03/16   Blane Ohara, MD  amLODipine (NORVASC) 10 MG tablet Take 10 mg by mouth daily. 12/07/21   [provider]  azelastine (ASTELIN) 0.1 % nasal spray Place 2 sprays into both nostrils 2 (two) times daily. Use in each nostril as directed Patient taking differently: Place 2 sprays into both nostrils 2 (two) times daily as needed for rhinitis or allergies. Use in each nostril as directed 11/16/17   Porfirio Oar, PA  bisoprolol (ZEBETA) 5 MG tablet Take 5 mg by mouth daily. 10/25/21   [provider]  buPROPion (WELLBUTRIN XL) 300 MG 24 hr tablet Take 300 mg by mouth every morning.    [provider]  busPIRone (BUSPAR) 15 MG tablet Take 1 tablet (15 mg total) by mouth 2 (two) times daily. Patient taking differently: Take 30 mg by mouth daily. 08/09/18   Oneta Rack, NP  DULoxetine (CYMBALTA) 60 MG  capsule Take 60 mg by mouth 2 (two) times daily. 04/21/22   [provider]  etonogestrel (NEXPLANON) 68 MG IMPL implant 1 each by Subdermal route once. 07/15/22   [provider]  Multiple Vitamin (MULTIVITAMIN PO) Take 1 tablet by mouth daily at 6 (six) AM.    [provider]  Na Sulfate-K Sulfate-Mg Sulf 17.5-3.13-1.6 GM/177ML SOLN Take 1 kit by mouth once.    [provider]  Semaglutide-Weight Management (WEGOVY) 0.5 MG/0.5ML SOAJ Inject 0.5 mg into the skin once a week.    [provider]  topiramate (TOPAMAX) 25 MG tablet Take 25 mg by mouth 2 (two) times daily as needed. 07/09/22   [provider]  traZODone (DESYREL) 50 MG tablet Take 50 mg by mouth at bedtime. 05/11/21   [provider]  Vitamin D, Ergocalciferol, (DRISDOL) 1.25 MG (50000 UNIT) CAPS capsule Take 50,000 Units by mouth once a week. 04/21/21   [provider]    Family History Family History  Problem Relation Age of Onset   Hypertension Mother    Liver cancer Father 17 - 74   Kidney disease Father    Liver disease Father        Unknown diagnosis   Hypertension Sister    Hypertension Brother    Drug abuse Brother    Breast cancer Maternal Grandmother    Colon polyps Neg Hx    Esophageal cancer Neg Hx    Stomach cancer Neg Hx    Rectal cancer Neg Hx     Social History Social History   Tobacco Use   Smoking status: Never   Smokeless tobacco: Never  Vaping Use   Vaping status: Never Used  Substance Use Topics   Alcohol use: No   Drug use: No     Allergies   Moxifloxacin, Clindamycin, Codeine, Levofloxacin, Metronidazole, Minocin [minocycline hcl], and Penicillins   Review of Systems Review of Systems  Constitutional: Negative.   HENT:  Positive for congestion, sinus pressure, sinus pain and sore throat.   Respiratory:  Positive for cough. Negative for shortness of breath and wheezing.   Cardiovascular: Negative.   Gastrointestinal:  Negative.   Musculoskeletal: Negative.   Psychiatric/Behavioral: Negative.       Physical Exam Triage Vital Signs ED Triage Vitals  Encounter Vitals Group     BP 08/05/23 1102 (!) 142/91     Systolic BP Percentile --      Diastolic BP Percentile --  Pulse Rate 08/05/23 1102 95     Resp 08/05/23 1102 17     Temp 08/05/23 1102 98.1 F (36.7 C)     Temp Source 08/05/23 1102 Oral     SpO2 08/05/23 1102 94 %     Weight --      Height --      Head Circumference --      Peak Flow --      Pain Score 08/05/23 1100 0     Pain Loc --      Pain Education --      Exclude from Growth Chart --    No data found.  Updated Vital Signs BP (!) 142/91 (BP Location: Right Arm)   Pulse 95   Temp 98.1 F (36.7 C) (Oral)   Resp 17   LMP  (LMP Unknown)   SpO2 94%   Visual Acuity Right Eye Distance:   Left Eye Distance:   Bilateral Distance:    Right Eye Near:   Left Eye Near:    Bilateral Near:     Physical Exam Constitutional:      Appearance: Normal appearance.  HENT:     Nose: Congestion and rhinorrhea present.     Left Sinus: Maxillary sinus tenderness present.     Mouth/Throat:     Mouth: Mucous membranes are moist.  Cardiovascular:     Rate and Rhythm: Normal rate and regular rhythm.  Pulmonary:     Effort: Pulmonary effort is normal.     Breath sounds: Normal breath sounds.  Musculoskeletal:     Cervical back: Normal range of motion and neck supple.  Skin:    General: Skin is warm.  Neurological:     General: No focal deficit present.     Mental Status: She is alert.  Psychiatric:        Mood and Affect: Mood normal.      UC Treatments / Results  Labs (all labs ordered are listed, but only abnormal results are displayed) Labs Reviewed - No data to display  EKG   Radiology No results found.  Procedures Procedures (including critical care time)  Medications Ordered in UC Medications - No data to display  Initial Impression / Assessment and  Plan / UC Course  I have reviewed the triage vital signs and the nursing notes.  Pertinent labs & imaging results that were available during my care of the patient were reviewed by me and considered in my medical decision making (see chart for details).  Will treat today for acute sinusitis. Reviewed patient's allergies.  She states she has taken doxy in the past and tolerated well.  Will use that for treatment.   Final Clinical Impressions(s) / UC Diagnoses   Final diagnoses:  Acute non-recurrent maxillary sinusitis     Discharge Instructions      You were diagnosed with a sinus infection today.  I have sent out an antibiotic to take twice/day x 10 days.  Please continue your over the counter medications, including flonase daily.  If you are not improving or worsening then please return for re-evaluation.      ED Prescriptions     Medication Sig Dispense Auth. Provider   doxycycline (VIBRAMYCIN) 100 MG capsule Take 1 capsule (100 mg total) by mouth 2 (two) times daily. 20 capsule Jannifer Franklin, MD      PDMP not reviewed this encounter.   Jannifer Franklin, MD 08/05/23 [phone]

## 2023-08-08 NOTE — Plan of Care (Signed)
[facility] Tonsillectomy/Adenoidectomy, Postoperative PEDS care plan entered in error.

## 2023-08-15 NOTE — Progress Notes (Signed)
[address]       [place] [address]             [phone]        Megan Lang [id number] Dec 08, 1970  History of Present Illness: Megan Lang is a 52 year old female with history of obesity, HTN and Ascending Aortic Aneurysm. She has been annually followed by Dr. Donata Clay and the [facility] since 2017 and her aneurysm has remained stable at 4.1 cm since 2016. She has no family history of TAA and has no personal history of connective tissue disorder. Today she admits to episodes of left sided chest pain that she attributes to heartburn and some SOB when she is sick. She also admits to bilateral lower extremity swelling. She denies dizziness and LOC. She does admit to forgetting to take her medications this AM.   Current Outpatient Medications on File Prior to Visit  Medication Sig Dispense Refill   albuterol (PROVENTIL HFA;VENTOLIN HFA) 108 (90 Base) MCG/ACT inhaler Inhale 1-2 puffs into the lungs every 6 (six) hours as needed for wheezing or shortness of breath. 1 Inhaler 0   amLODipine (NORVASC) 10 MG tablet Take 10 mg by mouth daily.     azelastine (ASTELIN) 0.1 % nasal spray Place 2 sprays into both nostrils 2 (two) times daily. Use in each nostril as directed (Patient taking differently: Place 2 sprays into both nostrils 2 (two) times daily as needed for rhinitis or allergies. Use in each nostril as directed) 30 mL 0   bisoprolol (ZEBETA) 5 MG tablet Take 5 mg by mouth daily.     buPROPion (WELLBUTRIN XL) 300 MG 24 hr tablet Take 300 mg by mouth every morning.     busPIRone (BUSPAR) 15 MG tablet Take 1 tablet (15 mg total) by mouth 2 (two) times daily. (Patient taking differently: Take 30 mg by mouth daily.) 60 tablet 0   doxycycline (VIBRAMYCIN) 100 MG capsule Take 1 capsule (100 mg total) by mouth 2 (two) times daily. 20 capsule 0   DULoxetine (CYMBALTA) 60 MG capsule Take 60 mg by mouth 2 (two) times daily.     etonogestrel (NEXPLANON) 68 MG IMPL  implant 1 each by Subdermal route once.     Multiple Vitamin (MULTIVITAMIN PO) Take 1 tablet by mouth daily at 6 (six) AM.     Na Sulfate-K Sulfate-Mg Sulf 17.5-3.13-1.6 GM/177ML SOLN Take 1 kit by mouth once.     Semaglutide-Weight Management (WEGOVY) 0.5 MG/0.5ML SOAJ Inject 0.5 mg into the skin once a week.     topiramate (TOPAMAX) 25 MG tablet Take 25 mg by mouth 2 (two) times daily as needed.     traZODone (DESYREL) 50 MG tablet Take 50 mg by mouth at bedtime.     Vitamin D, Ergocalciferol, (DRISDOL) 1.25 MG (50000 UNIT) CAPS capsule Take 50,000 Units by mouth once a week.     No current facility-administered medications on file prior to visit.   Vitals: Today's Vitals   08/23/23 1300  BP: (!) 157/93  Pulse: 97  Resp: 20  SpO2: 94%  Weight: 186 lb (84.4 kg)  Height: 5\' 2"  (1.575 m)   Body mass index is 34.02 kg/m.  Physical Exam General: Alert and oriented, no acute distress Neuro: Grossly intact CV: regular rate and rhythm, no murmur Pulm: Clear to auscultation bilaterally GI: +BS, no tenderness, no distension Extremities: No edema bilaterally  CTA Results: CLINICAL DATA:  Follow-up thoracic aortic aneurysm  EXAM: CT ANGIOGRAPHY CHEST WITH CONTRAST   TECHNIQUE: Multidetector CT imaging of the chest was performed using the standard protocol during bolus administration of intravenous contrast. Multiplanar CT image reconstructions and MIPs were obtained to evaluate the vascular anatomy.   RADIATION DOSE REDUCTION: This exam was performed according to the departmental dose-optimization program which includes automated exposure control, adjustment of the mA and/or kV according to patient size and/or use of iterative reconstruction technique.   CONTRAST:  75mL ISOVUE-370 IOPAMIDOL (ISOVUE-370) INJECTION 76%   COMPARISON:  03/01/2022   FINDINGS: Cardiovascular: Stable 4.1 cm ascending thoracic aortic aneurysm. No dissection. Scattered calcifications in the left  anterior descending coronary artery. Heart is normal size. No filling defects in the pulmonary arteries to suggest pulmonary emboli.   Mediastinum/Nodes: No mediastinal, hilar, or axillary adenopathy. Mildly prominent bilateral axillary lymph nodes are unchanged since prior study, likely reactive. Trachea and esophagus are unremarkable. Thyroid unremarkable. Soft tissue in the anterior mediastinum felt to reflect residual thymus.   Lungs/Pleura: Lungs are clear. No focal airspace opacities or suspicious nodules. No effusions.   Upper Abdomen: No acute findings. Stable right hepatic cyst, 2.1 cm.   Musculoskeletal: Chest wall soft tissues are unremarkable. No acute bony abnormality.   Review of the MIP images confirms the above findings.   IMPRESSION: Stable ascending thoracic aortic aneurysm, 4.1 cm. Recommend annual imaging followup by CTA or MRA. This recommendation follows 2010 ACCF/AHA/AATS/ACR/ASA/SCA/SCAI/SIR/STS/SVM Guidelines for the Diagnosis and Management of Patients with Thoracic Aortic Disease. Circulation. 2010; 121: Z610-R604. Aortic aneurysm NOS (ICD10-I71.9)   Coronary artery disease.    Electronically Signed   By: Charlett Nose M.D.   On: 08/16/2023 12:15  Impression and Plan:  TAA: Megan Lang presents to the clinic with a 4.1 cm ascending aortic aneurysm.  Echocardiogram from 2017 shows a structurally normal aortic valve without evidence of regurgitation.  We discussed the natural history and and risk factors for growth of ascending aortic aneurysms.  We covered the importance of tight blood pressure control, refraining from lifting heavy objects, and avoiding fluoroquinolones. The patient is aware of signs and symptoms of aortic dissection and when to present to the emergency department.  We will continue surveillance with CTA and follow up in 1 year.  HTN: The patient is hypertensive in clinic today. She takes Norvasc and Zebeta at home but the patient  forgot to take her medications this AM. BP was 120/84 at last PCP appointment in September. Stressed the importance of good BP control and taking her medications and she expressed understanding. Will not change medications at this time. Continue to follow closely with PCP and monitor BP at home.   CAD: Patient states she has chest pain at times that she relates to heartburn. CAD seen on CTA. Will refer to cardiology  Risk Modification:  Statin: Borderline HLD, will defer to PCP  Smoking cessation instruction/counseling given:  never smoker  Patient was counseled on importance of Blood Pressure Control.  Despite Medical intervention if the patient notices persistently elevated blood pressure readings.  They are instructed to contact their Primary Care Physician  Please avoid use of Fluoroquinolones as this can potentially increase your risk of Aortic Rupture and/or Dissection  Patient educated on signs and symptoms of Aortic Dissection, handout also provided in AVS  Jenny Reichmann, PA-C 08/15/23

## 2023-08-16 ENCOUNTER — Ambulatory Visit
Admission: RE | Admit: 2023-08-16 | Discharge: 2023-08-16 | Disposition: A | Discharge status: Home / Self Care | Payer: BC Managed Care – PPO | Source: Ambulatory Visit | Attending: Cardiothoracic Surgery | Admitting: Cardiothoracic Surgery

## 2023-08-16 DIAGNOSIS — I7121 Aneurysm of the ascending aorta, without rupture: Secondary | ICD-10-CM

## 2023-08-16 MED ORDER — IOPAMIDOL (ISOVUE-370) INJECTION 76%
500.0000 mL | Freq: Once | INTRAVENOUS | Status: AC | PRN
  Administered 2023-08-16: 75 mL via INTRAVENOUS

## 2023-08-23 ENCOUNTER — Encounter: Payer: Self-pay | Admitting: Physician Assistant

## 2023-08-23 ENCOUNTER — Ambulatory Visit: Payer: BC Managed Care – PPO | Admitting: Physician Assistant

## 2023-08-23 VITALS — BP 157/93 | HR 97 | Resp 20 | Ht 62.0 in | Wt 186.0 lb

## 2023-08-23 DIAGNOSIS — I7121 Aneurysm of the ascending aorta, without rupture: Secondary | ICD-10-CM

## 2023-08-23 DIAGNOSIS — I712 Thoracic aortic aneurysm, without rupture, unspecified: Secondary | ICD-10-CM | POA: Insufficient documentation

## 2023-08-23 NOTE — Patient Instructions (Signed)

## 2023-11-03 ENCOUNTER — Ambulatory Visit: Payer: 59 | Attending: Internal Medicine | Admitting: Internal Medicine

## 2023-11-03 VITALS — BP 132/90 | HR 86 | Ht 62.0 in | Wt 184.0 lb

## 2023-11-03 DIAGNOSIS — I7121 Aneurysm of the ascending aorta, without rupture: Secondary | ICD-10-CM

## 2023-11-03 DIAGNOSIS — I517 Cardiomegaly: Secondary | ICD-10-CM | POA: Diagnosis not present

## 2023-11-03 DIAGNOSIS — M314 Aortic arch syndrome [Takayasu]: Secondary | ICD-10-CM

## 2023-11-03 DIAGNOSIS — Z8249 Family history of ischemic heart disease and other diseases of the circulatory system: Secondary | ICD-10-CM | POA: Diagnosis not present

## 2023-11-03 DIAGNOSIS — Z8679 Personal history of other diseases of the circulatory system: Secondary | ICD-10-CM

## 2023-11-03 DIAGNOSIS — I10 Essential (primary) hypertension: Secondary | ICD-10-CM

## 2023-11-03 MED ORDER — LOSARTAN POTASSIUM 25 MG PO TABS: 25.0000 mg | ORAL_TABLET | Freq: Every day | ORAL | 3 refills | Status: DC

## 2023-11-03 NOTE — Patient Instructions (Signed)
Medication Instructions:  Your physician has recommended you make the following change in your medication:  START: losartan 25 mg by mouth once daily  *If you need a refill on your cardiac medications before your next appointment, please call your pharmacy*   Lab Work: IN 1-2 WEEKS: BMP  If you have labs (blood work) drawn today and your tests are completely normal, you will receive your results only by: MyChart Message (if you have MyChart) OR A paper copy in the mail If you have any lab test that is abnormal or we need to change your treatment, we will call you to review the results.   Testing/Procedures: Your physician has requested that you have an echocardiogram. Echocardiography is a painless test that uses sound waves to create images of your heart. It provides your doctor with information about the size and shape of your heart and how well your heart's chambers and valves are working. This procedure takes approximately one hour. There are no restrictions for this procedure. Please do NOT wear cologne, perfume, aftershave, or lotions (deodorant is allowed). Please arrive 15 minutes prior to your appointment time.  Please note: We ask at that you not bring children with you during ultrasound (echo/ vascular) testing. Due to room size and safety concerns, children are not allowed in the ultrasound rooms during exams. Our front office staff cannot provide observation of children in our lobby area while testing is being conducted. An adult accompanying a patient to their appointment will only be allowed in the ultrasound room at the discretion of the ultrasound technician under special circumstances. We apologize for any inconvenience.  Your physician has referred you to see Genetics.    Follow-Up: At [facility], you and your health needs are our priority.  As part of our continuing mission to provide you with exceptional heart care, we have created designated Provider Care  Teams.  These Care Teams include your primary Cardiologist (physician) and Advanced Practice Providers (APPs -  Physician Assistants and Nurse Practitioners) who all work together to provide you with the care you need, when you need it.     Your next appointment:   1 year(s)  Provider:   Riley Lam, MD

## 2023-11-03 NOTE — Progress Notes (Signed)
Cardiology Office Note:  .    Date:  11/03/2023  ID:  Megan Lang, DOB [birth year], MRN [id number] PCP: [facility] Providers Cardiologist:  None     CC: Prevention.  Consulted for the evaluation of aortic dilation at the behest of Dr. Alla German  History of Present Illness: .    Megan Lang is a 53 y.o. female HTN, Mild TAA formerly seen by [facility].  Megan Lang a 53 year old individual with a history of hypertension, presents for reestablishment of care following an emergency department visit. She had been experiencing chest and back pain, which was severe enough to affect her ability to walk and was accompanied by profuse sweating. The pain had initially woken her up at night and had worsened during the day at work. An aspirin provided temporary relief. The emergency department evaluation revealed a mild thoracic aortic aneurysm, which had been previously identified and monitored when the patient was 53 years old. The patient has been asymptomatic since the emergency department visit and is seeking preventive care.  The patient has a family history of aortic aneurysm, with a brother who had a type B dissection requiring open-heart surgery. 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 She has been managing her hypertension and has been advised to avoid smoking, manage her diet, and monitor her blood pressure. She has also been informed that her aortic aneurysm has not grown in size. The patient is currently asymptomatic but is concerned about preventing future symptoms.  Relevant histories: .  Social - never smoker - family history of brother had an aortic dissection at age 5  ROS: As per HPI.   Studies Reviewed: .   Cardiac Studies & Procedures     STRESS TESTS  MYOCARDIAL PERFUSION IMAGING 11/04/2015  Narrative  The left ventricular ejection fraction is normal (55-65%).  Nuclear stress EF: 61%.  Blood pressure demonstrated  a blunted response to exercise.  There was no ST segment deviation noted during stress.  The study is normal.  Normal stress nuclear study with no ischemia or infarction; EF 61 with normal wall motion.  ECHOCARDIOGRAM  ECHOCARDIOGRAM COMPLETE 10/30/2015  Narrative *[facility]* [address] [place], [place] [address] [phone]  ------------------------------------------------------------------- Transthoracic Echocardiography  Patient:    Megan Lang, Megan Lang MR #:       [id number] Study Date: 10/30/2015 Gender:     F Age:        44 Height:     157.5 cm Weight:     72.1 kg BSA:        1.8 m^2 Pt. Status: Room:  SONOGRAPHER  Clearence Ped, RCS PERFORMING   [facility], Outpatient ATTENDING    Chilton Si, MD ORDERING     Chilton Si, MD REFERRING    Chilton Si, MD  cc:  ------------------------------------------------------------------- LV EF: 55% -   60%  ------------------------------------------------------------------- Indications:      SOB (R06.02).  ------------------------------------------------------------------- History:   PMH:   Chest pain.  ------------------------------------------------------------------- Study Conclusions  - Left ventricle: The cavity size was normal. There was mild focal basal hypertrophy of the septum. Systolic function was normal. The estimated ejection fraction was in the range of 55% to 60%. Wall motion was normal; there were no regional wall motion abnormalities. Left ventricular diastolic function parameters were normal.  Impressions:  - Normal study.  Transthoracic echocardiography.  M-mode, complete 2D, spectral Doppler, and color Doppler.  Birthdate:  Patient birthdate: 1971/01/05.  Age:  Patient is 53 yr old.  Sex:  Gender: female. BMI: 29.1 kg/m^2.  Blood pressure:     124/84  Patient status: Outpatient.  Study date:  Study date: 10/30/2015. Study time: 07:46 AM.  Location:  [facility]  -------------------------------------------------------------------  ------------------------------------------------------------------- Left ventricle:  The cavity size was normal. There was mild focal basal hypertrophy of the septum. Systolic function was normal. The estimated ejection fraction was in the range of 55% to 60%. Wall motion was normal; there were no regional wall motion abnormalities. The transmitral flow pattern was normal. The deceleration time of the early transmitral flow velocity was normal. The pulmonary vein flow pattern was normal. The tissue Doppler parameters were normal. Left ventricular diastolic function parameters were normal.  ------------------------------------------------------------------- Aortic valve:   Structurally normal valve.   Cusp separation was normal.  Doppler:  Transvalvular velocity was within the normal range. There was no stenosis. There was no regurgitation.  ------------------------------------------------------------------- Aorta:  Aortic root: The aortic root was normal in size.  ------------------------------------------------------------------- Mitral valve:   Structurally normal valve.   Leaflet separation was normal.  Doppler:  Transvalvular velocity was within the normal range. There was no evidence for stenosis. There was no regurgitation.    Peak gradient (D): 4 mm Hg.  ------------------------------------------------------------------- Left atrium:  The atrium was normal in size.  ------------------------------------------------------------------- Right ventricle:  The cavity size was normal. Systolic function was normal.  ------------------------------------------------------------------- Pulmonic valve:    Structurally normal valve.   Cusp separation was normal.  Doppler:  Transvalvular velocity was within the normal range. There was no  regurgitation.  ------------------------------------------------------------------- Tricuspid valve:   Structurally normal valve.   Leaflet separation was normal.  Doppler:  Transvalvular velocity was within the normal range. There was trivial regurgitation.  ------------------------------------------------------------------- Right atrium:  The atrium was normal in size.  ------------------------------------------------------------------- Pericardium:  There was no pericardial effusion.  ------------------------------------------------------------------- Systemic veins: Inferior vena cava: The vessel was normal in size. The respirophasic diameter changes were in the normal range (= 50%), consistent with normal central venous pressure. Diameter: 11 mm.  ------------------------------------------------------------------- Measurements  IVC                                         Value        Reference ID                                          11    mm     ---------  Left ventricle                              Value        Reference LV ID, ED, PLAX chordal             (L)     41.4  mm     43 - 52 LV ID, ES, PLAX chordal                     27.2  mm     23 - 38 LV fx shortening, PLAX chordal              34    %      >=29  LV PW thickness, ED                         9.02  mm     --------- IVS/LV PW ratio, ED                 (H)     1.51         <=1.3 Stroke volume, 2D                           71    ml     --------- Stroke volume/bsa, 2D                       39    ml/m^2 --------- LV ejection fraction, 1-p A4C               66    %      --------- LV e&', lateral                              10.8  cm/s   --------- LV E/e&', lateral                            9.25         --------- LV e&', medial                               11.1  cm/s   --------- LV E/e&', medial                             9            --------- LV e&', average                              10.95 cm/s    --------- LV E/e&', average                            9.12         ---------  Ventricular septum                          Value        Reference IVS thickness, ED                           13.6  mm     ---------  LVOT                                        Value        Reference LVOT ID, S                                  20    mm     --------- LVOT area  3.14  cm^2   --------- LVOT peak velocity, S                       110   cm/s   --------- LVOT mean velocity, S                       86.4  cm/s   --------- LVOT VTI, S                                 22.7  cm     ---------  Aorta                                       Value        Reference Aortic root ID, ED                          29    mm     ---------  Left atrium                                 Value        Reference LA ID, A-P, ES                              35    mm     --------- LA ID/bsa, A-P                              1.94  cm/m^2 <=2.2 LA volume, S                                34.6  ml     --------- LA volume/bsa, S                            19.2  ml/m^2 --------- LA volume, ES, 1-p A4C                      25.9  ml     --------- LA volume/bsa, ES, 1-p A4C                  14.4  ml/m^2 --------- LA volume, ES, 1-p A2C                      39.9  ml     --------- LA volume/bsa, ES, 1-p A2C                  22.2  ml/m^2 ---------  Mitral valve                                Value        Reference Mitral E-wave peak velocity                 99.9  cm/s   --------- Mitral A-wave peak velocity  59.2  cm/s   --------- Mitral deceleration time                    165   ms     150 - 230 Mitral peak gradient, D                     4     mm Hg  --------- Mitral E/A ratio, peak                      1.7          ---------  Pulmonary arteries                          Value        Reference PA pressure, S, DP                          23    mm Hg  <=30  Tricuspid valve                              Value        Reference Tricuspid regurg peak velocity              224   cm/s   --------- Tricuspid peak RV-RA gradient               20    mm Hg  --------- Tricuspid maximal regurg velocity,          224   cm/s   --------- PISA  Systemic veins                              Value        Reference Estimated CVP                               3     mm Hg  ---------  Right ventricle                             Value        Reference TAPSE                                       25.9  mm     --------- RV pressure, S, DP                          23    mm Hg  <=30 RV s&', lateral, S                           15    cm/s   ---------  Legend: (L)  and  (H)  mark values outside specified reference range.  ------------------------------------------------------------------- Prepared and Electronically Authenticated by  Olga Millers 2017-01-12T09:20:20             Results            Physical Exam:    VS:  BP (!) 132/90 (BP Location: Left  Arm)   Pulse 86   Ht 5\' 2"  (1.575 m)   Wt 184 lb (83.5 kg)   SpO2 94%   BMI 33.65 kg/m    Wt Readings from Last 3 Encounters:  11/03/23 184 lb (83.5 kg)  08/23/23 186 lb (84.4 kg)  06/28/23 187 lb (84.8 kg)    Gen: no distress, obese   Neck: No JVD, no webbed neck Cardiac: No Rubs or Gallops, no Murmur, RRR +2 radial pulses Respiratory: Clear to auscultation bilaterally, normal effort, normal  respiratory rate GI: Soft, nontender, non-distended  MS: No  edema;  moves all extremities Integument: Skin feels warm Neuro:  At time of evaluation, alert and oriented to person/place/time/situation  Psych: Normal affect, patient feels ok   ASSESSMENT AND PLAN: .    Thoracic Aortic Aneurysm 53 year old with a mild thoracic aortic aneurysm, initially identified at age 57 (42 mm). No significant growth since last evaluation. Family history includes a brother with aortic dissection. Asymptomatic but seeks preventive care. Discussed  monitoring for symptoms (tearing pain in chest, back, abdomen) and emergency visits for sudden severe symptoms. Emphasized aggressive blood pressure management and genetic counseling for hereditary risks. - Order echocardiogram to confirm trileaflet aortic valve - Refer to clinical geneticist for genetic counseling and potential testing - Start losartan 25 mg daily for blood pressure management and potential aortic remodeling - Check blood pressure regularly - Order BMP in two weeks to monitor response to new medication - Advise on emergency room visit for any sudden severe symptoms  Hypertension Long-standing hypertension, currently not optimally controlled. Blood pressure management is crucial to prevent complications related to the thoracic aortic aneurysm. Discussed benefits of losartan for blood pressure control and potential aortic remodeling. - Start losartan 25 mg daily - Check blood pressure regularly - Order BMP in two weeks to monitor response to new medication  Coronary Artery Calcifications Small plaque buildup noted on recent CT of the aorta. Borderline cholesterol levels necessitate aggressive preventive measures. Discussed importance of cholesterol management and lifestyle modifications. - Order fasting lipids and LP(a) during BMP testing - Discuss importance of cholesterol management and lifestyle modifications  General Health Maintenance Advised on lifestyle modifications, including smoking cessation, blood pressure management, and dietary considerations. - Continue to avoid smoking - Maintain a healthy diet - Monitor blood pressure regularly   Riley Lam, MD FASE [facility] Cardiologist [facility]  [address] [place], [address] [place], [place] [address] [phone]  8:38 AM

## 2023-11-22 ENCOUNTER — Ambulatory Visit (HOSPITAL_COMMUNITY): Payer: 59 | Attending: Cardiovascular Disease

## 2023-11-22 DIAGNOSIS — M314 Aortic arch syndrome [Takayasu]: Secondary | ICD-10-CM | POA: Diagnosis present

## 2023-11-22 DIAGNOSIS — I10 Essential (primary) hypertension: Secondary | ICD-10-CM | POA: Insufficient documentation

## 2023-11-22 DIAGNOSIS — I517 Cardiomegaly: Secondary | ICD-10-CM | POA: Insufficient documentation

## 2023-11-22 DIAGNOSIS — Z8249 Family history of ischemic heart disease and other diseases of the circulatory system: Secondary | ICD-10-CM | POA: Diagnosis present

## 2023-11-22 LAB — ECHOCARDIOGRAM COMPLETE
Area-P 1/2: 4.96 cm2
S' Lateral: 2.6 cm

## 2023-11-23 ENCOUNTER — Encounter: Payer: Self-pay | Admitting: Internal Medicine

## 2023-11-25 ENCOUNTER — Encounter: Payer: Self-pay | Admitting: Internal Medicine

## 2023-11-28 ENCOUNTER — Telehealth: Payer: Self-pay | Admitting: Internal Medicine

## 2023-11-28 NOTE — Telephone Encounter (Signed)
 Pt c/o medication issue:  1. Name of Medication: Losartan   2. How are you currently taking this medication (dosage and times per day)?   3. Are you having a reaction (difficulty breathing--STAT)?   4. What is your medication issue? Lightheaded and dizziness, it is really bad. She said she fell at work- patient said she stopped taking it last Thursday(11-24-23)

## 2023-11-28 NOTE — Telephone Encounter (Signed)
Please see telephone encounter for more details.

## 2023-11-28 NOTE — Telephone Encounter (Signed)
 Called pt in regards to losartan .  Reports last Wednesday Feb 5 started feeling dizzy, lightheaded and had a fall. Thinks it is r/t Losartan .  Losartan  started on 11/03/23  BP at OV 132/90-86 .  Pt has a hx of mild thoracic aortic aneurysm.  Family history includes a brother with aortic dissection.   Denies other medication changes.  No recent illnesses.  BP range 128/81-139/93.  Last took medication on night of 11/22/23.  Reports dizziness and light headedness is just now starting to improve.  Denies having any other symptoms.  Pt did not get f/u labs drawn as advised at OV.  Advised pt will send to MD to advise.

## 2023-11-30 NOTE — Telephone Encounter (Signed)
Left a detailed message with MD response okay per DPR.  Advised pt Losartan was removed from medication list and listed as an allergy rxn dizzy and lightheaded.  Pt is to monitor BP and notify our office if BP is consistently above 135/85 to contact our office.  Advised with hx of TAA want to make sure it does not get any bigger; BP control is very important.  Advised to call our office or send a my chart message if has concerns.

## 2023-12-23 ENCOUNTER — Other Ambulatory Visit: Payer: Self-pay

## 2023-12-23 ENCOUNTER — Ambulatory Visit
Admission: RE | Admit: 2023-12-23 | Discharge: 2023-12-23 | Disposition: A | Discharge status: Home / Self Care | Source: Ambulatory Visit

## 2023-12-23 VITALS — BP 112/77 | HR 93 | Temp 97.8°F | Resp 16

## 2023-12-23 DIAGNOSIS — R197 Diarrhea, unspecified: Secondary | ICD-10-CM | POA: Diagnosis not present

## 2023-12-23 DIAGNOSIS — M545 Low back pain, unspecified: Secondary | ICD-10-CM

## 2023-12-23 DIAGNOSIS — R112 Nausea with vomiting, unspecified: Secondary | ICD-10-CM

## 2023-12-23 MED ORDER — ONDANSETRON 4 MG PO TBDP
4.0000 mg | ORAL_TABLET | Freq: Three times a day (TID) | ORAL | 0 refills | Status: AC | PRN
Start: 2023-12-23 — End: ?

## 2023-12-23 NOTE — Discharge Instructions (Signed)
 I have prescribed you nausea medication to take as needed.  Ensure adequate fluids, rest, bland diet as we discussed.  Follow-up at the emergency department if symptoms persist or worsen.

## 2023-12-23 NOTE — ED Triage Notes (Addendum)
 Pt reports N/V, diarrhea, and abdominal pain x3 days. Pt notes ongoing nausea and dull mid-abdominal pain. 2 emesis episodes yesterday with last one at 7am yesterday. Notes 10 watery stools in last 24hrs. Notes a low-grade fever of 99.4 yesterday, no fever today. Pt still keeping fluids and bland foods down since yesterday. (Water/ gatorade/ crackers/ plain chicken). Some relief with pepto at home.   Pt would also like someone to examine a swollen area to L mid-back. Pt noted swelling x3 days ago and believes she may have pulled something.

## 2023-12-23 NOTE — ED Provider Notes (Addendum)
 [facility]    CSN: [id number] Arrival date & time: 12/23/23  1023      History   Chief Complaint Chief Complaint  Patient presents with   Abdominal Pain   Nausea   Diarrhea   Emesis    HPI Megan Lang is a 53 y.o. female.   Patient presents with abdominal discomfort, nausea, vomiting, diarrhea.  Reports diarrhea started about 4 to 5 days ago, and nausea and vomiting started yesterday.  Reports several known sick contacts with similar symptoms at her workplace.  She denies any obvious fevers at home.  Denies blood in stool or emesis.  Patient has been able to tolerate small amounts of fluids and food.  Patient also reports "area of swelling with associated pain" in her left lower back. She is not reporting any radiation of pain down her legs. She denies any obvious injury to the area.  She is not reporting medications for pain.   Abdominal Pain Associated symptoms: vomiting   Diarrhea Associated symptoms: vomiting   Emesis   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   Keloid of skin 10/08/2019   Cervical myelopathy (HCC) 08/31/2018   Spinal stenosis in cervical region 08/29/2018   Cervicalgia 08/29/2018   Shoulder impingement syndrome, left 12/19/2017   Idiopathic scoliosis and kyphoscoliosis 07/01/2017   Chronic bilateral back pain  07/01/2017   Nexplanon in place 07/29/2016   Cardiomegaly 07/01/2016   History of aortic aneurysm 07/01/2016   GERD (gastroesophageal reflux disease) 01/23/2016   Dyspnea 10/27/2015   Costochondritis 08/26/2015   Obesity 07/01/2015   Upper airway cough syndrome 06/30/2015   History of shingles 01/26/2015   Family history of colon cancer 10/27/2011   Essential hypertension 10/27/2011    Past Surgical History:  Procedure Laterality Date   ANTERIOR CERVICAL DECOMPRESSION/DISCECTOMY FUSION 4 LEVELS N/A 08/31/2018   Procedure: ANTERIOR CERVICAL DECOMPRESSION/DISCECTOMY FUSION C3-7;  Surgeon: Venita Lick, MD;  Location: [facility] OR;  Service: Orthopedics;  Laterality: N/A;  6 hrs   CARPAL TUNNEL RELEASE Left 2009   CARPAL TUNNEL RELEASE Right 04/2022   COLONOSCOPY  2017   SA-MAC-suprep(good)-HPP/ TA x1/5 yr recall   COLONOSCOPY  06/28/2023   HAND SURGERY Right 03/2023   NASAL SINUS SURGERY     DEC 15,2016   nexplanon insertion     inserted 07-03-2019   ROTATOR CUFF REPAIR Left 04/14/2018   WISDOM TOOTH EXTRACTION      OB History     Gravida  3   Para  3   Term  3   Preterm      AB      Living  3      SAB      IAB  Ectopic      Multiple      Live Births  3            Home Medications    Prior to Admission medications   Medication Sig Start Date End Date Taking? Authorizing Provider  amLODipine (NORVASC) 10 MG tablet Take 10 mg by mouth daily. 12/07/21  Yes [provider]  bisoprolol (ZEBETA) 5 MG tablet Take 5 mg by mouth daily. 10/25/21  Yes [provider]  buPROPion (WELLBUTRIN XL) 300 MG 24 hr tablet Take 300 mg by mouth every morning.   Yes [provider]  busPIRone (BUSPAR) 15 MG tablet Take 1 tablet (15 mg total) by mouth 2 (two) times daily. Patient taking differently: Take 30 mg by mouth daily. 08/09/18  Yes Oneta Rack, NP  DULoxetine (CYMBALTA) 60 MG capsule Take 60 mg by mouth 2 (two) times daily. 04/21/22  Yes  [provider]  etonogestrel (NEXPLANON) 68 MG IMPL implant 1 each by Subdermal route once. 07/15/22  Yes [provider]  Multiple Vitamin (MULTIVITAMIN PO) Take 1 tablet by mouth daily at 6 (six) AM.   Yes [provider]  ondansetron (ZOFRAN-ODT) 4 MG disintegrating tablet Take 1 tablet (4 mg total) by mouth every 8 (eight) hours as needed for nausea or vomiting. 12/23/23  Yes Shonia Skilling, Rolly Salter E, FNP  traZODone (DESYREL) 50 MG tablet Take 50 mg by mouth at bedtime. 05/11/21  Yes [provider]  albuterol (PROVENTIL HFA;VENTOLIN HFA) 108 (90 Base) MCG/ACT inhaler Inhale 1-2 puffs into the lungs every 6 (six) hours as needed for wheezing or shortness of breath. 09/03/16   Blane Ohara, MD  azelastine (ASTELIN) 0.1 % nasal spray Place 2 sprays into both nostrils 2 (two) times daily. Use in each nostril as directed Patient taking differently: Place 2 sprays into both nostrils 2 (two) times daily as needed for rhinitis or allergies. Use in each nostril as directed 11/16/17   Porfirio Oar, PA  phentermine 15 MG capsule Take 15 mg by mouth daily. Patient not taking: Reported on 12/23/2023 12/16/23   [provider]  topiramate (TOPAMAX) 25 MG tablet Take 25 mg by mouth 2 (two) times daily as needed. Patient not taking: Reported on 12/23/2023 07/09/22   [provider]    Family History Family History  Problem Relation Age of Onset   Hypertension Mother    Liver cancer Father 58 - 44   Kidney disease Father    Liver disease Father        Unknown diagnosis   Hypertension Sister    Hypertension Brother    Drug abuse Brother    Breast cancer Maternal Grandmother    Colon polyps Neg Hx    Esophageal cancer Neg Hx    Stomach cancer Neg Hx    Rectal cancer Neg Hx     Social History Social History   Tobacco Use   Smoking status: Never   Smokeless tobacco: Never  Vaping Use   Vaping status: Never Used  Substance Use Topics   Alcohol use: No    Drug use: No     Allergies   Moxifloxacin, Clindamycin, Codeine, Levofloxacin, Losartan, Metronidazole, Minocin [minocycline hcl], and Penicillins   Review of Systems Review of Systems  Gastrointestinal:  Positive for vomiting.   Per HPI  Physical Exam Triage Vital Signs ED Triage Vitals [12/23/23 1041]  Encounter Vitals Group     BP 112/77     Systolic BP Percentile  Diastolic BP Percentile      Pulse Rate 93     Resp 16     Temp 97.8 F (36.6 C)     Temp Source Oral     SpO2 96 %     Weight      Height      Head Circumference      Peak Flow      Pain Score 3     Pain Loc      Pain Education      Exclude from Growth Chart    No data found.  Updated Vital Signs BP 112/77 (BP Location: Left Arm)   Pulse 93   Temp 97.8 F (36.6 C) (Oral)   Resp 16   SpO2 96%   Visual Acuity Right Eye Distance:   Left Eye Distance:   Bilateral Distance:    Right Eye Near:   Left Eye Near:    Bilateral Near:     Physical Exam Constitutional:      General: She is not in acute distress.    Appearance: Normal appearance. She is not toxic-appearing or diaphoretic.  HENT:     Head: Normocephalic and atraumatic.     Mouth/Throat:     Mouth: Mucous membranes are moist.     Pharynx: No posterior oropharyngeal erythema.  Eyes:     Extraocular Movements: Extraocular movements intact.     Conjunctiva/sclera: Conjunctivae normal.  Cardiovascular:     Rate and Rhythm: Normal rate and regular rhythm.     Pulses: Normal pulses.     Heart sounds: Normal heart sounds.  Pulmonary:     Effort: Pulmonary effort is normal. No respiratory distress.     Breath sounds: Normal breath sounds.  Abdominal:     General: Bowel sounds are normal. There is no distension.     Palpations: Abdomen is soft.     Tenderness: There is no abdominal tenderness.  Musculoskeletal:       Back:     Comments: Tenderness to palpation to left lower back.  No direct spinal tenderness, crepitus,  step-off noted.  No discoloration noted. No significant swelling noted.  Full ROM of upper extremities noted.   Neurological:     General: No focal deficit present.     Mental Status: She is alert and oriented to person, place, and time. Mental status is at baseline.     Deep Tendon Reflexes: Reflexes are normal and symmetric.  Psychiatric:        Mood and Affect: Mood normal.        Behavior: Behavior normal.        Thought Content: Thought content normal.        Judgment: Judgment normal.      UC Treatments / Results  Labs (all labs ordered are listed, but only abnormal results are displayed) Labs Reviewed - No data to display  EKG   Radiology No results found.  Procedures Procedures (including critical care time)  Medications Ordered in UC Medications - No data to display  Initial Impression / Assessment and Plan / UC Course  I have reviewed the triage vital signs and the nursing notes.  Pertinent labs & imaging results that were available during my care of the patient were reviewed by me and considered in my medical decision making (see chart for details).     1.  Nausea, vomiting, diarrhea  Suspect viral cause of symptoms given known sick contacts.  There are no signs of acute  abdomen or dehydration on exam that would warrant emergent evaluation.  Will prescribe ondansetron to take as needed.  Encouraged to continue oral fluids and bland diet.  Encouraged strict ER precautions if symptoms persist or worsen. It appears that patient takes trazodone and duloxetine but ondansetron benefits outweigh risks so as to prevent dehydration and when used sparingly is safe.   2.  Back pain  Suspect muscular strain causing pain.  Encouraged supportive care including heat application and safe over-the-counter pain relievers.  Advised patient to follow-up if pain persists or worsens.  Patient verbalized understanding and was agreeable with plan. Final Clinical Impressions(s) / UC  Diagnoses   Final diagnoses:  Nausea vomiting and diarrhea  Acute left-sided low back pain without sciatica     Discharge Instructions      I have prescribed you nausea medication to take as needed.  Ensure adequate fluids, rest, bland diet as we discussed.  Follow-up at the emergency department if symptoms persist or worsen.    ED Prescriptions     Medication Sig Dispense Auth. Provider   ondansetron (ZOFRAN-ODT) 4 MG disintegrating tablet Take 1 tablet (4 mg total) by mouth every 8 (eight) hours as needed for nausea or vomiting. 20 tablet [place], Acie Fredrickson, [place]      PDMP not reviewed this encounter.   Gustavus Bryant, [place] 12/23/23 1205    Gustavus Bryant, [place] 12/23/23 1208

## 2023-12-24 ENCOUNTER — Ambulatory Visit

## 2023-12-29 ENCOUNTER — Other Ambulatory Visit: Payer: Self-pay

## 2023-12-29 ENCOUNTER — Ambulatory Visit
Admission: RE | Admit: 2023-12-29 | Discharge: 2023-12-29 | Disposition: A | Discharge status: Home / Self Care | Source: Ambulatory Visit | Attending: Physician Assistant | Admitting: Physician Assistant

## 2023-12-29 VITALS — BP 152/95 | HR 99 | Temp 99.5°F | Resp 18

## 2023-12-29 DIAGNOSIS — J101 Influenza due to other identified influenza virus with other respiratory manifestations: Secondary | ICD-10-CM | POA: Insufficient documentation

## 2023-12-29 DIAGNOSIS — R42 Dizziness and giddiness: Secondary | ICD-10-CM | POA: Diagnosis not present

## 2023-12-29 LAB — POCT URINALYSIS DIP (MANUAL ENTRY)
Bilirubin, UA: NEGATIVE
Blood, UA: NEGATIVE
Glucose, UA: NEGATIVE mg/dL
Ketones, POC UA: NEGATIVE mg/dL
Nitrite, UA: NEGATIVE
Protein Ur, POC: NEGATIVE mg/dL
Spec Grav, UA: 1.02 (ref 1.010–1.025)
Urobilinogen, UA: 0.2 U/dL
pH, UA: 6.5 (ref 5.0–8.0)

## 2023-12-29 LAB — POCT INFLUENZA A/B
Influenza A, POC: POSITIVE — AB
Influenza B, POC: NEGATIVE

## 2023-12-29 MED ORDER — OSELTAMIVIR PHOSPHATE 75 MG PO CAPS: 75.0000 mg | ORAL_CAPSULE | Freq: Two times a day (BID) | ORAL | 0 refills | Status: DC

## 2023-12-29 NOTE — ED Provider Notes (Signed)
 [facility]    CSN: [id number] Arrival date & time: 12/29/23  1739      History   Chief Complaint Chief Complaint  Patient presents with   Dizziness    I have been dizzy and lightheaded for 3 days and now I have an sinus infection! - Entered by patient   Facial Pain    HPI Megan Lang is a 53 y.o. female.   Patient presents today with a 3-day history of intermittent dizziness.  She reports a sensation that the room is spinning when she moves her head but only last for few seconds and then resolves without intervention until she moves again.  She has also had several episodes of feeling hot and thought this was because she was dehydrated.  She has not recorded a fever and denies any ongoing chills.  She denies any nausea, vomiting, diarrhea.  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  She has not had influenza vaccine.  Has not had COVID in the past and has not had most recent vaccinations.  She does have a remote history of asthma but has not required albuterol inhaler.  She has not been taking any over-the-counter medication for symptom management.  She denies any recent antibiotics or steroids.  She is confident that she is not pregnant.    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   Keloid of skin 10/08/2019   Cervical  myelopathy (HCC) 08/31/2018   Spinal stenosis in cervical region 08/29/2018   Cervicalgia 08/29/2018   Shoulder impingement syndrome, left 12/19/2017   Idiopathic scoliosis and kyphoscoliosis 07/01/2017   Chronic bilateral back pain 07/01/2017   Nexplanon in place 07/29/2016   Cardiomegaly 07/01/2016   History of aortic aneurysm 07/01/2016   GERD (gastroesophageal reflux disease) 01/23/2016   Dyspnea 10/27/2015   Costochondritis 08/26/2015   Obesity 07/01/2015   Upper airway cough syndrome 06/30/2015   History of shingles 01/26/2015   Family history of colon cancer 10/27/2011   Essential hypertension 10/27/2011    Past Surgical History:  Procedure Laterality Date   ANTERIOR CERVICAL DECOMPRESSION/DISCECTOMY FUSION 4 LEVELS N/A 08/31/2018   Procedure: ANTERIOR CERVICAL DECOMPRESSION/DISCECTOMY FUSION C3-7;  Surgeon: Venita Lick, MD;  Location: [facility] OR;  Service: Orthopedics;  Laterality: N/A;  6 hrs   CARPAL TUNNEL RELEASE Left 2009   CARPAL TUNNEL RELEASE Right 04/2022   COLONOSCOPY  2017   SA-MAC-suprep(good)-HPP/ TA x1/5 yr recall   COLONOSCOPY  06/28/2023   HAND SURGERY Right 03/2023   NASAL SINUS SURGERY     DEC 15,2016   nexplanon insertion     inserted 07-03-2019   ROTATOR CUFF REPAIR Left 04/14/2018   WISDOM TOOTH EXTRACTION  OB History     Gravida  3   Para  3   Term  3   Preterm      AB      Living  3      SAB      IAB      Ectopic      Multiple      Live Births  3            Home Medications    Prior to Admission medications   Medication Sig Start Date End Date Taking? Authorizing Provider  oseltamivir (TAMIFLU) 75 MG capsule Take 1 capsule (75 mg total) by mouth every 12 (twelve) hours. 12/29/23  Yes Beverlie Kurihara K, PA-C  albuterol (PROVENTIL HFA;VENTOLIN HFA) 108 (90 Base) MCG/ACT inhaler Inhale 1-2 puffs into the lungs every 6 (six) hours as needed for wheezing or shortness of breath. 09/03/16   Blane Ohara, MD  amLODipine  (NORVASC) 10 MG tablet Take 10 mg by mouth daily. 12/07/21  Yes [provider]  azelastine (ASTELIN) 0.1 % nasal spray Place 2 sprays into both nostrils 2 (two) times daily. Use in each nostril as directed Patient taking differently: Place 2 sprays into both nostrils 2 (two) times daily as needed for rhinitis or allergies. Use in each nostril as directed 11/16/17   Porfirio Oar, PA  bisoprolol (ZEBETA) 5 MG tablet Take 5 mg by mouth daily. 10/25/21   [provider]  buPROPion (WELLBUTRIN XL) 300 MG 24 hr tablet Take 300 mg by mouth every morning.   Yes [provider]  busPIRone (BUSPAR) 15 MG tablet Take 1 tablet (15 mg total) by mouth 2 (two) times daily. Patient taking differently: Take 30 mg by mouth daily. 08/09/18   Oneta Rack, NP  DULoxetine (CYMBALTA) 60 MG capsule Take 60 mg by mouth 2 (two) times daily. 04/21/22   [provider]  etonogestrel (NEXPLANON) 68 MG IMPL implant 1 each by Subdermal route once. 07/15/22   [provider]  Multiple Vitamin (MULTIVITAMIN PO) Take 1 tablet by mouth daily at 6 (six) AM.    [provider]  ondansetron (ZOFRAN-ODT) 4 MG disintegrating tablet Take 1 tablet (4 mg total) by mouth every 8 (eight) hours as needed for nausea or vomiting. 12/23/23   Gustavus Bryant, FNP  phentermine 15 MG capsule Take 15 mg by mouth daily. Patient not taking: Reported on 12/23/2023 12/16/23   [provider]  topiramate (TOPAMAX) 25 MG tablet Take 25 mg by mouth 2 (two) times daily as needed. Patient not taking: Reported on 12/23/2023 07/09/22   [provider]  traZODone (DESYREL) 100 MG tablet Take 1 tablet by mouth at bedtime.   Yes [provider]  traZODone (DESYREL) 50 MG tablet Take 50 mg by mouth at bedtime. 05/11/21   [provider]    Family History Family History  Problem Relation Age of Onset   Hypertension Mother    Liver cancer Father 15 - 59   Kidney disease Father     Liver disease Father        Unknown diagnosis   Hypertension Sister    Hypertension Brother    Drug abuse Brother    Breast cancer Maternal Grandmother    Colon polyps Neg Hx    Esophageal cancer Neg Hx    Stomach cancer Neg Hx    Rectal cancer Neg Hx     Social History Social History   Tobacco Use  Smoking status: Never   Smokeless tobacco: Never  Vaping Use   Vaping status: Never Used  Substance Use Topics   Alcohol use: No   Drug use: No     Allergies   Moxifloxacin, Clindamycin, Codeine, Levofloxacin, Losartan, Metronidazole, Minocin [minocycline hcl], and Penicillins   Review of Systems Review of Systems  Constitutional:  Positive for activity change. Negative for appetite change, fatigue and fever.  HENT:  Positive for congestion and sore throat. Negative for sinus pressure and sneezing.   Respiratory:  Positive for cough. Negative for shortness of breath.   Cardiovascular:  Negative for chest pain.  Gastrointestinal:  Negative for abdominal pain, diarrhea, nausea and vomiting.  Neurological:  Positive for dizziness. Negative for light-headedness and headaches.     Physical Exam Triage Vital Signs ED Triage Vitals  Encounter Vitals Group     BP 12/29/23 1859 (!) 152/95     Systolic BP Percentile --      Diastolic BP Percentile --      Pulse Rate 12/29/23 1859 (!) 101     Resp 12/29/23 1859 18     Temp 12/29/23 1859 99.5 F (37.5 C)     Temp Source 12/29/23 1859 Oral     SpO2 12/29/23 1859 98 %     Weight --      Height --      Head Circumference --      Peak Flow --      Pain Score 12/29/23 1856 0     Pain Loc --      Pain Education --      Exclude from Growth Chart --    No data found.  Updated Vital Signs BP (!) 152/95 (BP Location: Left Arm)   Pulse 99   Temp 99.5 F (37.5 C) (Oral)   Resp 18   LMP  (LMP Unknown)   SpO2 96%   Visual Acuity Right Eye Distance:   Left Eye Distance:   Bilateral Distance:    Right Eye Near:   Left  Eye Near:    Bilateral Near:     Physical Exam Vitals reviewed.  Constitutional:      General: She is awake. She is not in acute distress.    Appearance: Normal appearance. She is well-developed. She is not ill-appearing.     Comments: Very pleasant female appears stated age in no acute distress sitting comfortably in exam room  HENT:     Head: Normocephalic and atraumatic.     Right Ear: Tympanic membrane, ear canal and external ear normal. Tympanic membrane is not erythematous or bulging.     Left Ear: Tympanic membrane, ear canal and external ear normal. Tympanic membrane is not erythematous or bulging.     Nose: Nose normal.     Right Sinus: No maxillary sinus tenderness or frontal sinus tenderness.     Left Sinus: No maxillary sinus tenderness or frontal sinus tenderness.     Mouth/Throat:     Pharynx: Uvula midline. No oropharyngeal exudate or posterior oropharyngeal erythema.  Cardiovascular:     Rate and Rhythm: Normal rate and regular rhythm.     Heart sounds: Normal heart sounds, S1 normal and S2 normal. No murmur heard. Pulmonary:     Effort: Pulmonary effort is normal.     Breath sounds: Normal breath sounds. No wheezing, rhonchi or rales.     Comments: Clear to auscultation bilaterally Musculoskeletal:     Comments: Strength 5/5 bilateral upper and lower extremities  Neurological:     General: No focal deficit present.     Mental Status: She is alert and oriented to person, place, and time.     Cranial Nerves: Cranial nerves 2-12 are intact.     Motor: Motor function is intact.     Coordination: Coordination is intact.     Comments: Cranial nerves II through XII grossly intact.  Psychiatric:        Behavior: Behavior is cooperative.      UC Treatments / Results  Labs (all labs ordered are listed, but only abnormal results are displayed) Labs Reviewed  POCT INFLUENZA A/B - Abnormal; Notable for the following components:      Result Value   Influenza A, POC  Positive (*)    All other components within normal limits  POCT URINALYSIS DIP (MANUAL ENTRY) - Abnormal; Notable for the following components:   Leukocytes, UA Small (1+) (*)    All other components within normal limits  URINE CULTURE    EKG   Radiology No results found.  Procedures Procedures (including critical care time)  Medications Ordered in UC Medications - No data to display  Initial Impression / Assessment and Plan / UC Course  I have reviewed the triage vital signs and the nursing notes.  Pertinent labs & imaging results that were available during my care of the patient were reviewed by me and considered in my medical decision making (see chart for details).     Patient is well-appearing, afebrile, nontoxic.  She was initially mildly tachycardic but this improved after she sat quietly for several minutes.  Vital signs and physical exam are reassuring with no indication for emergent evaluation or imaging.  UA was obtained to check for dehydration but did show trace leukocytes.  Will send this for culture but defer antibiotics until culture results are available.  No evidence of acute infection on physical exam that warrant initiation of antibiotics.  Chest x-ray was deferred as she had no adventitious lung sounds on exam and her oxygen saturation was 96%.  She did test positive for influenza we discussed this is likely cause of her symptoms.  She is within 48 hours of symptom onset so we will start Tamiflu twice daily for 5 days and we discussed this can cause side effects including nausea, vomiting, diarrhea as well as abnormal thoughts and behaviors and if this happens she is to stop the medication to be seen immediately.  Recommended over-the-counter medication for additional symptom relief.  She is to rest and drink plenty of fluid.  We discussed that if her symptoms change in any way and she has recurrent dizziness, associated lightheadedness, weakness, nausea/vomiting,  chest pain, shortness of breath she needs to go to the ER.  Strict return precautions given.  Work excuse note provided.  Final Clinical Impressions(s) / UC Diagnoses   Final diagnoses:  Dizziness  Influenza A     Discharge Instructions      You tested positive for influenza.  I believe this is causing your symptoms.  Please make sure you are drinking plenty of fluid.  Start Tamiflu twice daily for 5 days.  This can cause upset stomach so if you have this or any other side effects (abnormal dreams or behaviors) stop the medication to be seen immediately.  Use over-the-counter medication including Mucinex, Flonase, Tylenol, ibuprofen for symptom relief.  Make sure you are resting and drinking lots of water.  If your symptoms are not improved within a few days  please follow-up with your primary care.  If anything worsens and you have increasing dizziness, nausea, vomiting, chest pain, shortness of breath, worsening cough, high fever not responding to medications you need to be seen immediately.     ED Prescriptions     Medication Sig Dispense Auth. Provider   oseltamivir (TAMIFLU) 75 MG capsule Take 1 capsule (75 mg total) by mouth every 12 (twelve) hours. 10 capsule Pearlina Friedly, Noberto Retort, PA-C      PDMP not reviewed this encounter.   Jeani Hawking, Cordelia Poche 12/29/23 2146

## 2023-12-29 NOTE — Discharge Instructions (Signed)
 You tested positive for influenza.  I believe this is causing your symptoms.  Please make sure you are drinking plenty of fluid.  Start Tamiflu twice daily for 5 days.  This can cause upset stomach so if you have this or any other side effects (abnormal dreams or behaviors) stop the medication to be seen immediately.  Use over-the-counter medication including Mucinex, Flonase, Tylenol, ibuprofen for symptom relief.  Make sure you are resting and drinking lots of water.  If your symptoms are not improved within a few days please follow-up with your primary care.  If anything worsens and you have increasing dizziness, nausea, vomiting, chest pain, shortness of breath, worsening cough, high fever not responding to medications you need to be seen immediately.

## 2023-12-29 NOTE — ED Triage Notes (Signed)
 Pt presents with dizziness and sinus infection, and body aches x 3 days. Pt denies emesis, fever and diarrhea.

## 2023-12-30 LAB — URINE CULTURE

## 2024-01-18 ENCOUNTER — Ambulatory Visit (INDEPENDENT_AMBULATORY_CARE_PROVIDER_SITE_OTHER): Payer: Medicaid Other | Admitting: Nurse Practitioner

## 2024-01-18 ENCOUNTER — Encounter: Payer: Self-pay | Admitting: Nurse Practitioner

## 2024-01-18 VITALS — BP 128/82 | HR 77 | Ht 62.0 in | Wt 177.0 lb

## 2024-01-18 DIAGNOSIS — Z01419 Encounter for gynecological examination (general) (routine) without abnormal findings: Secondary | ICD-10-CM

## 2024-01-18 DIAGNOSIS — Z3046 Encounter for surveillance of implantable subdermal contraceptive: Secondary | ICD-10-CM

## 2024-01-18 NOTE — Progress Notes (Signed)
 Megan Lang Auguste 06-18-1971 [id number]   History:  53 y.o. G3P3003 presents for annual exam. Nexplanon 06/2022. Amenorrheic. Has started having hot flashes and night sweats. Normal pap history. H/O HTN, prediabetes, aortic aneurysm.   Gynecologic History No LMP recorded (lmp unknown). Patient has had an implant.   Contraception/Family planning: Nexplanon Sexually active: No  Health Maintenance Last Pap: 01/06/2022. Results were: Normal Last mammogram: 01/21/2023. Results were: Normal Last colonoscopy: 06/28/2023. Results were: Benign polyp, 10-year recall Last Dexa: Not indicated     11/14/2018   10:49 AM  Depression screen PHQ 2/9  Decreased Interest 0  Down, Depressed, Hopeless 0  PHQ - 2 Score 0     Past medical history, past surgical history, family history and social history were all reviewed and documented in the EPIC chart. 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 3 children, 4 grandchildren. They live in [place].   ROS:  A ROS was performed and pertinent positives and negatives are included.  Exam:  Vitals:   01/18/24 0800  BP: 128/82  Pulse: 77  SpO2: 99%  Weight: 177 lb (80.3 kg)  Height: 5\' 2"  (1.575 m)   Body mass index is 32.37 kg/m.  General appearance:  Normal Thyroid:  Symmetrical, normal in size, without palpable masses or nodularity. Respiratory  Auscultation:  Clear without wheezing or rhonchi Cardiovascular  Auscultation:  Regular rate, without rubs, murmurs or gallops  Edema/varicosities:  Not grossly evident Abdominal  Soft,nontender, without masses, guarding or rebound.  Liver/spleen:  No organomegaly noted  Hernia:  None appreciated  Skin  Inspection:  Grossly normal Breasts: Examined lying and sitting.   Right: Without masses, retractions, nipple discharge or axillary adenopathy.   Left: Without masses, retractions, nipple discharge or axillary adenopathy. Pelvic: External genitalia:  no lesions              Urethra:  normal appearing urethra with no  masses, tenderness or lesions              Bartholins and Skenes: normal                 Vagina: normal appearing vagina with normal color and discharge, no lesions              Cervix: no lesions Bimanual Exam:  Uterus:  no masses or tenderness              Adnexa: no mass, fullness, tenderness              Rectovaginal: Deferred              Anus:  normal, no lesions  Patient informed chaperone available to be present for breast and pelvic exam. Patient has requested no chaperone to be present. Patient has been advised what will be completed during breast and pelvic exam.   Assessment/Plan:  53 y.o. G3P3003 for annual exam.   Well female exam with routine gynecological exam - Education provided on SBEs, importance of preventative screenings, current guidelines, high calcium diet, regular exercise, and multivitamin daily. Labs with PCP.   Encounter for surveillance of implantable subdermal contraceptive - Nexplanon 06/2022, amenorrheic. Will check hormone levels next year to determine removal versus exchange.   Screening for cervical cancer - Normal Pap history.  Will repeat at 3-year interval per guidelines.  Screening for breast cancer - Normal mammogram history.  Continue annual screenings.  Normal breast exam today.  Screening for colon cancer - 06/2023 Will repeat at 10-year interval per GI's recommendation.  Return in about 1 year (around 01/17/2025) for Annual.    Olivia Mackie DNP, 8:44 AM 01/18/2024

## 2024-01-23 ENCOUNTER — Other Ambulatory Visit: Payer: Self-pay | Admitting: Nurse Practitioner

## 2024-01-23 DIAGNOSIS — Z1231 Encounter for screening mammogram for malignant neoplasm of breast: Secondary | ICD-10-CM

## 2024-01-24 ENCOUNTER — Ambulatory Visit: Payer: 59 | Admitting: Genetic Counselor

## 2024-01-27 ENCOUNTER — Ambulatory Visit
Admission: RE | Admit: 2024-01-27 | Discharge: 2024-01-27 | Disposition: A | Discharge status: Home / Self Care | Source: Ambulatory Visit | Attending: Nurse Practitioner | Admitting: Nurse Practitioner

## 2024-01-27 DIAGNOSIS — Z1231 Encounter for screening mammogram for malignant neoplasm of breast: Secondary | ICD-10-CM

## 2024-03-30 ENCOUNTER — Encounter: Payer: Self-pay | Admitting: Allergy

## 2024-03-30 ENCOUNTER — Other Ambulatory Visit: Payer: Self-pay

## 2024-03-30 ENCOUNTER — Ambulatory Visit: Admitting: Allergy

## 2024-03-30 VITALS — BP 122/84 | HR 87 | Temp 97.9°F | Resp 16 | Ht 62.21 in | Wt 180.3 lb

## 2024-03-30 DIAGNOSIS — J4520 Mild intermittent asthma, uncomplicated: Secondary | ICD-10-CM | POA: Diagnosis not present

## 2024-03-30 DIAGNOSIS — J329 Chronic sinusitis, unspecified: Secondary | ICD-10-CM

## 2024-03-30 MED ORDER — RYALTRIS 665-25 MCG/ACT NA SUSP: 2.0000 | Freq: Two times a day (BID) | NASAL | 1 refills | Status: AC

## 2024-03-30 NOTE — Progress Notes (Signed)
 New Patient Note  RE: Megan Lang MRN: [id number] DOB: Aug 24, 1971 Date of Office Visit: 03/30/2024  Primary care provider: Narda Bacon, MD  Chief Complaint: Sinus issues  History of present illness: Megan Lang is a 53 y.o. female presenting today for evaluation of sinusitis. Discussed the use of AI scribe software for clinical note transcription with the patient, who gave verbal consent to proceed.  She experiences recurrent sinus infections, having undergone sinus surgery six years ago to address a blockage in her left nostril. Post-surgery, her symptoms initially improved, but over the past two years, the frequency of infections has increased to about four times a year, occurring randomly. Symptoms include loss of voice, postnasal drip, cough, sinus pressure, and occasional headaches, with no fever. Typically, her infections require two rounds of antibiotics and sometimes prednisone  for resolution.  She has not seen an ENT in the past two years since her symptoms worsened.   She has year-round allergy  symptoms, which worsen during pollen season. Symptoms include sneezing, itchy and watery eyes, and throat clearing. She takes Claritin daily for her allergies, which she feels helps, but she cannot take stronger medications due to interactions with her anxiety medications. She has used azelastine  nasal spray in the past, which was helpful, but currently does not use it. She has also used Flonase  but had to stop due to interactions with her medications, causing her head to spin.  She has a history of mild intermittent asthma, for which she uses an albuterol  inhaler infrequently, typically during respiratory illnesses like when she had the flu. No history of eczema or food allergies.     Review of systems: 10pt ROS negative unless noted above in HPI  Past medical history: Past Medical History:  Diagnosis Date   Acid reflux    with certain foods/ takes OTC PRN meds   Allergy      Aortic aneurysm (HCC)    Arthritis    generalized   Asthma    PRN inhaler   Cardiomegaly    Carpal tunnel syndrome    Depression    on meds   Gastric polyp    GERD (gastroesophageal reflux disease) 01/23/2016   Hiatal hernia    Hypertension    on meds   Normal spontaneous vaginal delivery    3   Prediabetes 06/2023   Recurrent upper respiratory infection (URI)     Past surgical history: Past Surgical History:  Procedure Laterality Date   ANTERIOR CERVICAL DECOMPRESSION/DISCECTOMY FUSION 4 LEVELS N/A 08/31/2018   Procedure: ANTERIOR CERVICAL DECOMPRESSION/DISCECTOMY FUSION C3-7;  Surgeon: Mort Ards, MD;  Location: [facility] OR;  Service: Orthopedics;  Laterality: N/A;  6 hrs   CARPAL TUNNEL RELEASE Left 2009   CARPAL TUNNEL RELEASE Right 04/2022   COLONOSCOPY  2017   SA-MAC-suprep(good)-HPP/ TA x1/5 yr recall   COLONOSCOPY  06/28/2023   HAND SURGERY Left 03/2023   NASAL SINUS SURGERY     DEC 15,2016   nexplanon  insertion     inserted 07-03-2019   ROTATOR CUFF REPAIR Left 04/14/2018   WISDOM TOOTH EXTRACTION      Family history:  Family History  Problem Relation Age of Onset   Hypertension Mother    Liver cancer Father 26 - 69   Kidney disease Father    Liver disease Father        Unknown diagnosis   Hypertension Sister    Hypertension Brother    Drug abuse Brother    Breast cancer Maternal Grandmother  Colon polyps Neg Hx    Esophageal cancer Neg Hx    Stomach cancer Neg Hx    Rectal cancer Neg Hx     Social history: Lives in a home with carpeting with electric heating and central cooling.  No pets in the home.  There is no concern for water damage, mildew or roaches in the home.  She works in Primary school teacher.  Denies a smoking history.   Medication List: Current Outpatient Medications  Medication Sig Dispense Refill   albuterol  (PROVENTIL  HFA;VENTOLIN  HFA) 108 (90 Base) MCG/ACT inhaler Inhale 1-2 puffs into the lungs every 6 (six) hours as needed for  wheezing or shortness of breath. 1 Inhaler 0   amLODipine  (NORVASC ) 10 MG tablet Take 10 mg by mouth daily.     azelastine  (ASTELIN ) 0.1 % nasal spray Place 2 sprays into both nostrils 2 (two) times daily. Use in each nostril as directed 30 mL 0   bisoprolol  (ZEBETA ) 5 MG tablet Take 5 mg by mouth daily.     buPROPion (WELLBUTRIN XL) 300 MG 24 hr tablet Take 300 mg by mouth every morning.     busPIRone  (BUSPAR ) 15 MG tablet Take 1 tablet (15 mg total) by mouth 2 (two) times daily. 60 tablet 0   DULoxetine (CYMBALTA) 60 MG capsule Take 60 mg by mouth 2 (two) times daily.     etonogestrel  (NEXPLANON ) 68 MG IMPL implant 1 each by Subdermal route once.     Multiple Vitamin (MULTIVITAMIN PO) Take 1 tablet by mouth daily at 6 (six) AM.     traZODone  (DESYREL ) 100 MG tablet Take 1 tablet by mouth at bedtime.     ondansetron  (ZOFRAN -ODT) 4 MG disintegrating tablet Take 1 tablet (4 mg total) by mouth every 8 (eight) hours as needed for nausea or vomiting. (Patient not taking: Reported on 03/30/2024) 20 tablet 0   No current facility-administered medications for this visit.    Known medication allergies: Allergies  Allergen Reactions   Moxifloxacin Swelling    other  Other Reaction(s): Not available  moxifloxacin   Clindamycin Diarrhea and Other (See Comments)    GI upset   Codeine  Other (See Comments)    TACHYCARDIA    Levofloxacin  Other (See Comments)    other   Losartan  Other (See Comments)    Dizzy and lightheaded    Metronidazole  Diarrhea   Minocin  [Minocycline  Hcl] Other (See Comments)    Stomach Pains   Penicillins Rash    Has patient had a PCN reaction causing immediate rash, facial/tongue/throat swelling, SOB or lightheadedness with hypotension:yes  Has patient had a PCN reaction causing severe rash involving mucus membranes or skin necrosis:no  Has patient had a PCN reaction that required hospitalization:no  Has patient had a PCN reaction occurring within the last 10  years:No  If all of the above answers are NO, then may proceed with Cephalosporin  Other Reaction(s): Not available     Physical examination: Blood pressure 122/84, pulse 87, temperature 97.9 F (36.6 C), temperature source Temporal, resp. rate 16, height 5' 2.21 (1.58 m), weight 180 lb 4.8 oz (81.8 kg), SpO2 97%.  General: Alert, interactive, in no acute distress. HEENT: PERRLA, TMs pearly gray, turbinates moderately edematous without discharge, post-pharynx non erythematous. Neck: Supple without lymphadenopathy. Lungs: Clear to auscultation without wheezing, rhonchi or rales. {no increased work of breathing. CV: Normal S1, S2 without murmurs. Abdomen: Nondistended, nontender. Skin: Warm and dry, without lesions or rashes. Extremities:  No clubbing, cyanosis or edema. Neuro:  Grossly intact.  Diagnostics/Labs:  Spirometry: FEV1: 2.2L 102%, FVC: 2.5L 93%, ratio consistent with nonobstructive pattern  Assessment and plan:   Chronic sinusitis Chronic sinusitis with recurrent infections, requiring antibiotics and sometimes steroids. Symptoms include sinus pressure, postnasal drip, cough, hoarsness and occasional headaches. Sinus surgery six years ago initially improved symptoms, but she has worsened over the past two years. No recent ENT follow-up.   - Schedule allergy  skin testing.  Instructed to discontinue Claritin three days prior to skin testing. - Educated on using saline rinse with distilled or boiled water for nasal passage cleaning.  Use saline rinse kit prior to nasal spray use.  Use distilled water or boil water and bring to room temperature (do not use tap water).   Keep mouth open entirety of rinse.   - use Ryaltris nasal sprays 2 sprays each nostril twice a day for runny or stuffy nose control.  This is a special nasal spray that has 2 ingredients: mometasone for congestion control and olopatadine for drainage control.  This spray goes to a specialty pharmacy and gets  mailed to the home. With using nasal sprays point tip of bottle toward eye on same side nostril and lean head slightly forward for best technique.   - Continue Claritin 10 mg daily - Use Pataday 1 drop each eye daily as needed for itchy watery eyes   Mild intermittent asthma Mild intermittent asthma with infrequent albuterol  inhaler use, primarily during respiratory illnesses. - Perform a pulmonary function test -normal - Have access to albuterol  inhaler 2 puffs every 4-6 hours as needed for cough/wheeze/shortness of breath/chest tightness.  May use 15-20 minutes prior to activity.   Monitor frequency of use.     Schedule skin testing visit and hold antihistamines for 3 days prior.  (Env 1-55) Routine follow-up in 4 to 6 months or sooner if needed  I appreciate the opportunity to take part in Megan Lang's care. Please do not hesitate to contact me with questions.  Sincerely,   Catha Clink, MD Allergy /Immunology [facility]

## 2024-03-30 NOTE — Addendum Note (Signed)
 Addended by: Melvia Matousek E on: 03/30/2024 04:17 PM   Modules accepted: Orders

## 2024-03-30 NOTE — Patient Instructions (Addendum)
 Chronic sinusitis Chronic sinusitis with recurrent infections, requiring antibiotics and sometimes steroids. Symptoms include sinus pressure, postnasal drip, cough, hoarsness and occasional headaches. Sinus surgery six years ago initially improved symptoms, but she has worsened over the past two years. No recent ENT follow-up.   - Schedule allergy  skin testing.  Instructed to discontinue Claritin three days prior to skin testing. - Educated on using saline rinse with distilled or boiled water for nasal passage cleaning.  Use saline rinse kit prior to nasal spray use.  Use distilled water or boil water and bring to room temperature (do not use tap water).   Keep mouth open entirety of rinse.   - use Ryaltris nasal sprays 2 sprays each nostril twice a day for runny or stuffy nose control.  This is a special nasal spray that has 2 ingredients: mometasone for congestion control and olopatadine for drainage control.  This spray goes to a specialty pharmacy and gets mailed to the home. With using nasal sprays point tip of bottle toward eye on same side nostril and lean head slightly forward for best technique.   - Continue Claritin 10 mg daily - Use Pataday 1 drop each eye daily as needed for itchy watery eyes   Mild intermittent asthma Mild intermittent asthma with infrequent albuterol  inhaler use, primarily during respiratory illnesses. - Perform a pulmonary function test -normal - Have access to albuterol  inhaler 2 puffs every 4-6 hours as needed for cough/wheeze/shortness of breath/chest tightness.  May use 15-20 minutes prior to activity.   Monitor frequency of use.     Schedule skin testing visit and hold antihistamines for 3 days prior. Routine follow-up in 4 to 6 months or sooner if needed

## 2024-04-05 ENCOUNTER — Ambulatory Visit: Admitting: Allergy

## 2024-04-05 ENCOUNTER — Encounter: Payer: Self-pay | Admitting: Allergy

## 2024-04-05 DIAGNOSIS — J329 Chronic sinusitis, unspecified: Secondary | ICD-10-CM | POA: Diagnosis not present

## 2024-04-05 NOTE — Progress Notes (Signed)
 Follow-up Note  RE: Megan Lang MRN: [id number] DOB: 02/27/1971 Date of Office Visit: 04/05/2024   History of present illness: Megan Lang is a 53 y.o. female presenting today for skin testing visit.  She was last seen in the office on 03/30/24 for CRS.  She is in her usual state of health today without recent illness.  She has held antihistamines for at least 3 days for testing today.   Medication List: Current Outpatient Medications  Medication Sig Dispense Refill   albuterol  (PROVENTIL  HFA;VENTOLIN  HFA) 108 (90 Base) MCG/ACT inhaler Inhale 1-2 puffs into the lungs every 6 (six) hours as needed for wheezing or shortness of breath. 1 Inhaler 0   amLODipine  (NORVASC ) 10 MG tablet Take 10 mg by mouth daily.     azelastine  (ASTELIN ) 0.1 % nasal spray Place 2 sprays into both nostrils 2 (two) times daily. Use in each nostril as directed 30 mL 0   bisoprolol  (ZEBETA ) 5 MG tablet Take 5 mg by mouth daily.     buPROPion (WELLBUTRIN XL) 300 MG 24 hr tablet Take 300 mg by mouth every morning.     busPIRone  (BUSPAR ) 15 MG tablet Take 1 tablet (15 mg total) by mouth 2 (two) times daily. 60 tablet 0   DULoxetine (CYMBALTA) 60 MG capsule Take 60 mg by mouth 2 (two) times daily.     etonogestrel  (NEXPLANON ) 68 MG IMPL implant 1 each by Subdermal route once.     Multiple Vitamin (MULTIVITAMIN PO) Take 1 tablet by mouth daily at 6 (six) AM.     Olopatadine-Mometasone (RYALTRIS) 665-25 MCG/ACT SUSP Place 2 sprays into the nose in the morning and at bedtime. 29 g 1   ondansetron  (ZOFRAN -ODT) 4 MG disintegrating tablet Take 1 tablet (4 mg total) by mouth every 8 (eight) hours as needed for nausea or vomiting. (Patient not taking: Reported on 03/30/2024) 20 tablet 0   traZODone  (DESYREL ) 100 MG tablet Take 1 tablet by mouth at bedtime.     No current facility-administered medications for this visit.     Known medication allergies: Allergies  Allergen Reactions   Moxifloxacin Swelling     other  Other Reaction(s): Not available  moxifloxacin   Clindamycin Diarrhea and Other (See Comments)    GI upset   Codeine  Other (See Comments)    TACHYCARDIA    Levofloxacin  Other (See Comments)    other   Losartan  Other (See Comments)    Dizzy and lightheaded    Metronidazole  Diarrhea   Minocin  [Minocycline  Hcl] Other (See Comments)    Stomach Pains   Penicillins Rash    Has patient had a PCN reaction causing immediate rash, facial/tongue/throat swelling, SOB or lightheadedness with hypotension:yes  Has patient had a PCN reaction causing severe rash involving mucus membranes or skin necrosis:no  Has patient had a PCN reaction that required hospitalization:no  Has patient had a PCN reaction occurring within the last 10 years:No  If all of the above answers are NO, then may proceed with Cephalosporin  Other Reaction(s): Not available    Diagnostics/Labs:  Allergy  testing:   Airborne Adult Perc - 04/05/24 0916     Time Antigen Placed 4098    Allergen Manufacturer Floyd Hutchinson    Location Back    Number of Test 55    Panel 1 Select    1. Control-Buffer 50% Glycerol Negative    2. Control-Histamine 2+    3. Bahia 2+    4. [place] Negative  5. Johnson 2+    6. [place]  Blue 2+    7. Meadow Fescue 2+    8. Perennial Rye Negative    9. Timothy Negative    10. Ragweed Mix Negative    11. Cocklebur Negative    12. Plantain,  English Negative    13. Baccharis Negative    14. Dog Fennel Negative    15. Russian Thistle Negative    16. Lamb's Quarters Negative    17. Sheep Sorrell Negative    18. Rough Pigweed Negative    19. Marsh Elder, Rough Negative    20. Mugwort, Common Negative    21. Box, Elder Negative    22. Cedar, red Negative    23. Sweet Gum Negative    24. Pecan Pollen Negative    25. Pine Mix Negative    26. Walnut, Black Pollen Negative    27. Red Mulberry Negative    28. Ash Mix Negative    29. Birch Mix Negative    30. Beech American Negative     31. Cottonwood, [place] 2+    32. Hickory, White Negative    33. Maple Mix 2+    34. Oak, [place] Mix Negative    35. Sycamore Eastern Negative    36. Alternaria Alternata Negative    37. Cladosporium Herbarum Negative    38. Aspergillus Mix Negative    39. Penicillium Mix 3+    40. Bipolaris Sorokiniana (Helminthosporium) 2+    41. Drechslera Spicifera (Curvularia) Negative    42. Mucor Plumbeus Negative    43. Fusarium Moniliforme Negative    44. Aureobasidium Pullulans (pullulara) Negative    45. Rhizopus Oryzae Negative    46. Botrytis Cinera Negative    47. Epicoccum Nigrum Negative    48. Phoma Betae Negative    49. Dust Mite Mix Negative    50. Cat Hair 10,000 BAU/ml Negative    51.  Dog Epithelia Negative    52. Mixed Feathers Negative    53. Horse Epithelia Negative    54. Cockroach, German Negative    55. Tobacco Leaf Negative          Intradermal - 04/05/24 0941     Time Antigen Placed 0941    Allergen Manufacturer Floyd Hutchinson    Location Arm    Number of Test 9    Control Negative    Ragweed Mix Negative    Weed Mix Negative    Mold 1 Negative    Mold 4 Negative    Mite Mix Negative    Cat Negative    Dog Negative    Cockroach Negative          Allergy  testing results were read and interpreted by provider, documented by clinical staff.   Assessment and plan: Chronic sinusitis Chronic sinusitis with recurrent infections, requiring antibiotics and sometimes steroids. Symptoms include sinus pressure, postnasal drip, cough, hoarsness and occasional headaches. Sinus surgery six years ago initially improved symptoms, but she has worsened over the past two years. No recent ENT follow-up.   - Testing today showed: grasses, trees, indoor molds, and outdoor molds - Copy of test results provided.  - Avoidance measures provided. - Consider allergy  shots as a means of long-term control. - Allergy  shots re-train and reset the immune system to ignore  environmental allergens and decrease the resulting immune response to those allergens (sneezing, itchy watery eyes, runny nose, nasal congestion, etc).    - Allergy  shots improve symptoms in 75-85% of patients.  -  We can discuss more at the next appointment if the medications are not working for you.   - Educated on using saline rinse with distilled or boiled water for nasal passage cleaning.  Use saline rinse kit prior to nasal spray use.  Use distilled water or boil water and bring to room temperature (do not use tap water).   Keep mouth open entirety of rinse.   - use Ryaltris nasal sprays 2 sprays each nostril twice a day for runny or stuffy nose control.  This is a special nasal spray that has 2 ingredients: mometasone for congestion control and olopatadine for drainage control.  This spray goes to a specialty pharmacy and gets mailed to the home. With using nasal sprays point tip of bottle toward eye on same side nostril and lean head slightly forward for best technique.   - Continue Claritin 10 mg daily - Use Pataday 1 drop each eye daily as needed for itchy watery eyes   Mild intermittent asthma Mild intermittent asthma with infrequent albuterol  inhaler use, primarily during respiratory illnesses. - Have access to albuterol  inhaler 2 puffs every 4-6 hours as needed for cough/wheeze/shortness of breath/chest tightness.  May use 15-20 minutes prior to activity.   Monitor frequency of use.    Routine follow-up in 4 to 6 months or sooner if needed  I appreciate the opportunity to take part in Jennalyn's care. Please do not hesitate to contact me with questions.  Sincerely,   Catha Clink, MD Allergy /Immunology [facility]

## 2024-04-05 NOTE — Patient Instructions (Signed)
 Chronic sinusitis Chronic sinusitis with recurrent infections, requiring antibiotics and sometimes steroids. Symptoms include sinus pressure, postnasal drip, cough, hoarsness and occasional headaches. Sinus surgery six years ago initially improved symptoms, but she has worsened over the past two years. No recent ENT follow-up.   - Testing today showed: grasses, trees, indoor molds, and outdoor molds - Copy of test results provided.  - Avoidance measures provided. - Consider allergy  shots as a means of long-term control. - Allergy  shots re-train and reset the immune system to ignore environmental allergens and decrease the resulting immune response to those allergens (sneezing, itchy watery eyes, runny nose, nasal congestion, etc).    - Allergy  shots improve symptoms in 75-85% of patients.  - We can discuss more at the next appointment if the medications are not working for you.   - Educated on using saline rinse with distilled or boiled water for nasal passage cleaning.  Use saline rinse kit prior to nasal spray use.  Use distilled water or boil water and bring to room temperature (do not use tap water).   Keep mouth open entirety of rinse.   - use Ryaltris nasal sprays 2 sprays each nostril twice a day for runny or stuffy nose control.  This is a special nasal spray that has 2 ingredients: mometasone for congestion control and olopatadine for drainage control.  This spray goes to a specialty pharmacy and gets mailed to the home. With using nasal sprays point tip of bottle toward eye on same side nostril and lean head slightly forward for best technique.   - Continue Claritin 10 mg daily - Use Pataday 1 drop each eye daily as needed for itchy watery eyes   Mild intermittent asthma Mild intermittent asthma with infrequent albuterol  inhaler use, primarily during respiratory illnesses. - Have access to albuterol  inhaler 2 puffs every 4-6 hours as needed for cough/wheeze/shortness of breath/chest  tightness.  May use 15-20 minutes prior to activity.   Monitor frequency of use.    Routine follow-up in 4 to 6 months or sooner if needed

## 2024-04-13 ENCOUNTER — Other Ambulatory Visit: Payer: Self-pay | Admitting: Allergy

## 2024-04-13 DIAGNOSIS — J329 Chronic sinusitis, unspecified: Secondary | ICD-10-CM

## 2024-04-16 DIAGNOSIS — J301 Allergic rhinitis due to pollen: Secondary | ICD-10-CM | POA: Diagnosis not present

## 2024-04-16 NOTE — Progress Notes (Signed)
 Aeroallergen Immunotherapy  Ordering Provider: Dr. Danita Brain  Patient Details Name: Megan Lang MRN: [id number] Date of Birth: 03/21/1971  Order 2 of 2  Vial Label: Mold  0.2 ml (Volume)  1:10 Concentration -- Penicillium mix 0.2 ml (Volume)  1:20 Concentration -- Bipolaris sorokiniana   0.4 ml Extract Subtotal 4.6 ml Diluent  5.0  ml Maintenance Total  Schedule:  B Blue Vial (1:100,000): Schedule B (6 doses) Yellow Vial (1:10,000): Schedule B (6 doses) Green Vial (1:1,000): Schedule B (6 doses) Red Vial (1:100): Schedule A (14 doses)  Special Instructions: After completion of the first Red Vial, please space to every two weeks. After completion of the second Red Vial, please space to every 4 weeks. Ok to up dose new vials at 0.36mL --> 0.3 mL --> 0.5 mL.  Ok to come twice weekly, if desired, as long as there is 48 hours between injections.

## 2024-04-16 NOTE — Progress Notes (Signed)
 Aeroallergen Immunotherapy  Ordering Provider: Dr. Danita Brain  Patient Details Name: Megan Lang MRN: [id number] Date of Birth: 10-29-1970  Order 1 of 2  Vial Label: Pollen  0.3 ml (Volume)  BAU Concentration -- 7 Grass Mix* 100,000 ([place]  Blue, [place], [place], Perennial [place], RedTop, Sweet Vernal, Timothy) 0.2 ml (Volume)  1:20 Concentration -- [place] 0.2 ml (Volume)  1:20 Concentration -- [place] 0.5 ml (Volume)  1:20 Concentration -- Eastern 10 Tree Mix (also Sweet Gum)   1.2 ml Extract Subtotal 3.8 ml Diluent  5.0  ml Maintenance Total  Schedule:  B  Blue Vial (1:100,000): Schedule B (6 doses) Yellow Vial (1:10,000): Schedule B (6 doses) Green Vial (1:1,000): Schedule B (6 doses) Red Vial (1:100): Schedule A (14 doses)  Special Instructions: After completion of the first Red Vial, please space to every two weeks. After completion of the second Red Vial, please space to every 4 weeks. Ok to up dose new vials at 0.55mL --> 0.3 mL --> 0.5 mL.  Ok to come twice weekly, if desired, as long as there is 48 hours between injections.

## 2024-04-16 NOTE — Progress Notes (Signed)
 VIALS MADE 04-16-24

## 2024-04-17 DIAGNOSIS — J302 Other seasonal allergic rhinitis: Secondary | ICD-10-CM | POA: Diagnosis not present

## 2024-04-30 ENCOUNTER — Ambulatory Visit (INDEPENDENT_AMBULATORY_CARE_PROVIDER_SITE_OTHER)

## 2024-04-30 DIAGNOSIS — J309 Allergic rhinitis, unspecified: Secondary | ICD-10-CM

## 2024-04-30 NOTE — Progress Notes (Signed)
 Immunotherapy   Patient Details  Name: Megan Lang MRN: [id number] Date of Birth: 11/03/1970  04/30/2024  Megan Lang started injections for Pollen and Mold. Patient received 0.05 ml of both her blue vials with an exp of 04/16/2025. Patient waited 30 minutes with nor problems.  Following schedule: B Frequency:2 times per week Epi-Pen:Epi-Pen Available  Consent signed and patient instructions given.   Megan Lang 04/30/2024, 8:52 AM

## 2024-05-03 ENCOUNTER — Ambulatory Visit

## 2024-05-03 DIAGNOSIS — J309 Allergic rhinitis, unspecified: Secondary | ICD-10-CM | POA: Diagnosis not present

## 2024-05-07 ENCOUNTER — Ambulatory Visit (INDEPENDENT_AMBULATORY_CARE_PROVIDER_SITE_OTHER)

## 2024-05-07 DIAGNOSIS — J309 Allergic rhinitis, unspecified: Secondary | ICD-10-CM

## 2024-05-09 ENCOUNTER — Ambulatory Visit (INDEPENDENT_AMBULATORY_CARE_PROVIDER_SITE_OTHER)

## 2024-05-09 DIAGNOSIS — J309 Allergic rhinitis, unspecified: Secondary | ICD-10-CM

## 2024-05-14 ENCOUNTER — Ambulatory Visit (INDEPENDENT_AMBULATORY_CARE_PROVIDER_SITE_OTHER)

## 2024-05-14 DIAGNOSIS — J309 Allergic rhinitis, unspecified: Secondary | ICD-10-CM

## 2024-05-17 ENCOUNTER — Ambulatory Visit (INDEPENDENT_AMBULATORY_CARE_PROVIDER_SITE_OTHER)

## 2024-05-17 DIAGNOSIS — J309 Allergic rhinitis, unspecified: Secondary | ICD-10-CM

## 2024-05-22 ENCOUNTER — Ambulatory Visit (INDEPENDENT_AMBULATORY_CARE_PROVIDER_SITE_OTHER)

## 2024-05-22 DIAGNOSIS — J309 Allergic rhinitis, unspecified: Secondary | ICD-10-CM | POA: Diagnosis not present

## 2024-05-24 ENCOUNTER — Ambulatory Visit (INDEPENDENT_AMBULATORY_CARE_PROVIDER_SITE_OTHER)

## 2024-05-24 DIAGNOSIS — J309 Allergic rhinitis, unspecified: Secondary | ICD-10-CM

## 2024-05-28 ENCOUNTER — Ambulatory Visit (INDEPENDENT_AMBULATORY_CARE_PROVIDER_SITE_OTHER)

## 2024-05-28 DIAGNOSIS — J309 Allergic rhinitis, unspecified: Secondary | ICD-10-CM | POA: Diagnosis not present

## 2024-05-29 ENCOUNTER — Ambulatory Visit: Attending: Genetic Counselor | Admitting: Genetic Counselor

## 2024-05-29 DIAGNOSIS — Z8249 Family history of ischemic heart disease and other diseases of the circulatory system: Secondary | ICD-10-CM

## 2024-05-29 DIAGNOSIS — I7121 Aneurysm of the ascending aorta, without rupture: Secondary | ICD-10-CM

## 2024-05-31 ENCOUNTER — Ambulatory Visit (INDEPENDENT_AMBULATORY_CARE_PROVIDER_SITE_OTHER)

## 2024-05-31 DIAGNOSIS — J309 Allergic rhinitis, unspecified: Secondary | ICD-10-CM

## 2024-06-04 ENCOUNTER — Ambulatory Visit (INDEPENDENT_AMBULATORY_CARE_PROVIDER_SITE_OTHER)

## 2024-06-04 DIAGNOSIS — J309 Allergic rhinitis, unspecified: Secondary | ICD-10-CM

## 2024-06-06 NOTE — Progress Notes (Signed)
 Referral Reason Ziya Coonrod was referred for genetic consult and testing of aortopathies considering her family history of aortic dissections and cardiac imaging studies that detected aortic aneurysm of 4.2 cm.  Personal Medical Information Megan Lang (III.7 on pedigree) is a 53 y.o. African American lady who, 8 years ago, reported having sudden onset of chest and back pain at work that was accompanied by profuse sweating Upon evaluation at the ER she was found to have an aortic aneurysm of 4.2 cm. She informs me of being asymptomatic since then. Recent imaging studies did not detect significant growth since last evaluation.  Traditional Risk Factors She reports having long-standing hypertension, currently not optimally controlled. Also has coronary artery calcifications with small plaque buildup noted on recent CT of the aorta.   Family history Relation to Proband Pedigree # Current age Heart condition/age of onset Notes  Daughters, 3 IV.8,-IV.10 35, 31, 29 None   Grandchildren, 4 V.1-V.4 11-2          Sisters, 2 III.5, III.6 Deceased None Died of poor health @ 3 Died soon after birth  Brothers, 4 III.1-III.4 65, 63, 60, 58 III.2- Aortic dissection @ 63 Experienced severe pain in back and chest due to dissected aortic aneurysm, size 5.2 cm. Genetic testing is planned for next month  Nephews, nieces IV.1- IV.7 30s None         Father II.1 Deceased None Died of liver cancer @ 24  Paternal grandfather 1.1 Deceased None Died suddenly @ ?- autopsy described massive MI as cause of death  Paternal grandmother I.2 Age ? None         Mother II.2 Deceased None  Died of G.I. issues @ 60s. Internal hemorrhage @ 50s.   Maternal aunts, 3 II.3-II.4, II.9 Deceased None II.3-II.4: died @ 77s - old age and bowel cancer II.9- died of ? @  ?- her son (III.8) has mild AoD of 4.6 cm  Maternal uncles, 4 II.5-II.8 Deceased None Died @ 50s- old age  Maternal grandfather I.3 Deceased None Died @ ? of ?   Maternal grandmother I.4 Deceased None Died @ ? of ?   Pre-Test Genetic Consultation Notes  Informed Carlathat while congenital conditions that include a bicuspid aortic valve and conotruncal defects account for nearly 70% of aortic dilatations (AoD), idiopathic and genetic disorders account for 11% and 17% of AoD, respectively. Patient was counseled on the genetics of syndromes that present with aortic aneurysms and dissections, specifically Marfan syndrome (MFS), Loeys-Dietz syndrome (LDS), Vascular Ehlers-Danlos Syndrome (vEDS) and other non-syndromic familial forms of thoracic aortic aneurysms and dissection (FTAAD).   I explained to patient that as aortopathies are an autosomal dominant condition children have a 50% chance of inheriting this condition. Also informed patient of the high rate of de novo mutations in the syndromic aortopathy conditions. Patient verbalized understanding.   I discussed incomplete penetrance associated with this condition i.e. not all individuals harboring a mutation will present clinically, and age-related penetrance where clinical presentation increases with advanced age. I also reviewed variable expression and emphasized that this condition can express at any age at any level of severity in the family. Hence, it is important for first-degree relatives to undergo CT-A screening for aortic aneurysms.   We walked through the process of genetic testing.   I explained that genetic testing is a probabilistic test dependent upon age and severity of presentation, presence of risk factors and importantly family history of aortic aneurysms or sudden death in first-degree relatives.  The potential outcomes of genetic testing and subsequent management of at-risk family members were discussed so as to manage expectations-  While nearly 11% of AoD cases are idiopathic, a negative test result can be due to limitations of the genetic test. I explained that if a mutation is not  identified, then all first-degree relatives should undergo cardiac screening for aortic aneurysms by echocardiogram. Per Edgar et al., 2023 "In sporadic TAAs (only one family member affected), only the affected patient requires follow-up, and a single screening echocardiogram is deemed sufficient in adult relatives (Erbel et al., 2014) unless the first test is performed before the age of 40 years, in which case a further test after the age of 40 years could be considered Aleene et al., 2018). In case the measured aortic diameter is borderline, a second echocardiography after 5 years appears wise."  There is also the likelihood of identifying a "Variant of unknown significance." This result means that the variant has not been detected in a statistically significant number of patients and/or functional studies have not been performed to verify its pathogenicity. This VUS can be tested in the family to see if it segregates with disease. If a VUS is found, first-degree relatives should get screened.  If a pathogenic variant is reported, then first-degree family members can get tested for this variant. If they test positive, it is likely they will develop an aortopathy. In light of variable expression and incomplete penetrance associated with this condition, it is not possible to predict when they will manifest clinically with an aneurysm. It is recommended that family members that test positive for the familial pathogenic variant pursue clinical screening by MRI or CT with reevaluation every 2-5 years.  Impression  In summary, Preslynn presents with a thoracic aortic aneurysm of 4.2 cm at age 42 in the presence of risk factors. However, she does have a significant family history of aortic aneurysms in a first degree relatve and a maternal cousin. Considering her early age of presentation and family history it is highly likely that she inherited this condition from her mother.  Genetic testing for the genes  implicated in aortopathies is recommended. This test should include the major genes that contribute to MFS, LDS, vEDS and FTAAD. The genetic test will help confirm the diagnosis and identify the genetic basis of disease. In addition, confirming the diagnosis will allow appropriate and timely intervention as needed for the condition as guidelines for threshold for surgical intervention in patients with Mavis Pyo syndrome is lower (>4.7mm on TEE) than those with MFS (>4.5 -5.0 mm, based on risk factors).  Since this is an autosomal dominant condition, a positive test result will help identify first-degree family members that may harbor  the mutation and are at risk of developing this condition. Appropriate cardiology follow-up and lifestyle management can then be directed to those genotype-positive family members.   In addition, patient should be aware of protections afforded by the Genetic Information Non-Discrimination Act (GINA). GINA protects a patient from losing their employment or health insurance based on their genotype. However, these protections do not cover life insurance and disability. Explained to the patient that family members that are found to have the familial genetic mutation will be denied life insurance even if they are asymptomatic and do not exhibit clinical signs. She verbalized understanding.   Please note that the patient has not been counseled in this visit on personal, cultural. or ethical issues that they may face due to their heart condition.  Plan After a thorough discussion of the risk and benefits of genetic testing for aortopathies, Billy defers genetic testing for now as her brother with a repaired aortic aneurysm is scheduled to have one soon. If he is found to have a pathogenic variant she will pursue genetic testing for this familial variant.    Danford Pac, Ph.D, [facility] Clinical Molecular Geneticist

## 2024-06-07 ENCOUNTER — Ambulatory Visit (INDEPENDENT_AMBULATORY_CARE_PROVIDER_SITE_OTHER)

## 2024-06-07 DIAGNOSIS — J309 Allergic rhinitis, unspecified: Secondary | ICD-10-CM | POA: Diagnosis not present

## 2024-06-12 ENCOUNTER — Ambulatory Visit (INDEPENDENT_AMBULATORY_CARE_PROVIDER_SITE_OTHER)

## 2024-06-12 DIAGNOSIS — J309 Allergic rhinitis, unspecified: Secondary | ICD-10-CM | POA: Diagnosis not present

## 2024-06-14 ENCOUNTER — Ambulatory Visit (INDEPENDENT_AMBULATORY_CARE_PROVIDER_SITE_OTHER)

## 2024-06-14 DIAGNOSIS — J309 Allergic rhinitis, unspecified: Secondary | ICD-10-CM | POA: Diagnosis not present

## 2024-06-14 MED ORDER — EPINEPHRINE 0.3 MG/0.3ML IJ SOAJ: 0.3000 mg | INTRAMUSCULAR | 1 refills | Status: AC | PRN

## 2024-06-20 ENCOUNTER — Ambulatory Visit (INDEPENDENT_AMBULATORY_CARE_PROVIDER_SITE_OTHER)

## 2024-06-20 DIAGNOSIS — J309 Allergic rhinitis, unspecified: Secondary | ICD-10-CM | POA: Diagnosis not present

## 2024-06-22 ENCOUNTER — Ambulatory Visit (INDEPENDENT_AMBULATORY_CARE_PROVIDER_SITE_OTHER)

## 2024-06-22 DIAGNOSIS — J309 Allergic rhinitis, unspecified: Secondary | ICD-10-CM | POA: Diagnosis not present

## 2024-06-26 ENCOUNTER — Ambulatory Visit (INDEPENDENT_AMBULATORY_CARE_PROVIDER_SITE_OTHER)

## 2024-06-26 DIAGNOSIS — J309 Allergic rhinitis, unspecified: Secondary | ICD-10-CM

## 2024-06-28 ENCOUNTER — Ambulatory Visit (INDEPENDENT_AMBULATORY_CARE_PROVIDER_SITE_OTHER)

## 2024-06-28 DIAGNOSIS — J309 Allergic rhinitis, unspecified: Secondary | ICD-10-CM | POA: Diagnosis not present

## 2024-07-03 ENCOUNTER — Ambulatory Visit (INDEPENDENT_AMBULATORY_CARE_PROVIDER_SITE_OTHER)

## 2024-07-03 DIAGNOSIS — J309 Allergic rhinitis, unspecified: Secondary | ICD-10-CM | POA: Diagnosis not present

## 2024-07-05 ENCOUNTER — Ambulatory Visit

## 2024-07-06 NOTE — Progress Notes (Signed)
 ERR

## 2024-07-10 ENCOUNTER — Ambulatory Visit (INDEPENDENT_AMBULATORY_CARE_PROVIDER_SITE_OTHER)

## 2024-07-10 DIAGNOSIS — J309 Allergic rhinitis, unspecified: Secondary | ICD-10-CM | POA: Diagnosis not present

## 2024-07-18 ENCOUNTER — Ambulatory Visit

## 2024-07-18 DIAGNOSIS — J309 Allergic rhinitis, unspecified: Secondary | ICD-10-CM

## 2024-07-27 ENCOUNTER — Ambulatory Visit (INDEPENDENT_AMBULATORY_CARE_PROVIDER_SITE_OTHER)

## 2024-07-27 DIAGNOSIS — J309 Allergic rhinitis, unspecified: Secondary | ICD-10-CM | POA: Diagnosis not present

## 2024-08-01 ENCOUNTER — Other Ambulatory Visit: Payer: Self-pay | Admitting: Thoracic Surgery (Cardiothoracic Vascular Surgery)

## 2024-08-01 ENCOUNTER — Ambulatory Visit (INDEPENDENT_AMBULATORY_CARE_PROVIDER_SITE_OTHER)

## 2024-08-01 DIAGNOSIS — J309 Allergic rhinitis, unspecified: Secondary | ICD-10-CM | POA: Diagnosis not present

## 2024-08-01 DIAGNOSIS — I7121 Aneurysm of the ascending aorta, without rupture: Secondary | ICD-10-CM

## 2024-08-08 ENCOUNTER — Ambulatory Visit: Admitting: Allergy

## 2024-08-08 ENCOUNTER — Encounter: Payer: Self-pay | Admitting: Allergy

## 2024-08-08 ENCOUNTER — Other Ambulatory Visit: Payer: Self-pay

## 2024-08-08 VITALS — BP 124/78 | HR 84 | Temp 98.1°F | Resp 18 | Ht 62.0 in | Wt 185.1 lb

## 2024-08-08 DIAGNOSIS — J3089 Other allergic rhinitis: Secondary | ICD-10-CM | POA: Diagnosis not present

## 2024-08-08 DIAGNOSIS — J4520 Mild intermittent asthma, uncomplicated: Secondary | ICD-10-CM | POA: Diagnosis not present

## 2024-08-08 DIAGNOSIS — J329 Chronic sinusitis, unspecified: Secondary | ICD-10-CM | POA: Diagnosis not present

## 2024-08-08 NOTE — Progress Notes (Signed)
 a   Follow-up Note  RE: ELIORA NIENHUIS MRN: [id number] DOB: [birth year] Date of Office Visit: 08/08/2024   History of present illness: Megan Lang is a 53 y.o. female presenting today for follow-up of allergic rhinitis/CRS.  She was last seen in the office on 04/05/2024 by myself. Discussed the use of AI scribe software for clinical note transcription with the patient, who gave verbal consent to proceed.  She began allergy  shots in June and is currently in the maintenance phase, using the full strength vial. She is halfway to the full strength dosing of 0.5 mLs, having received 0.25 mLs last week. Her symptoms have improved but she is not yet asymptomatic.  Since starting the allergy  shots, she has experienced one sinus infection during the summer, which required antibiotics. She has a history of sinus surgery and frequent sinus infections. Current symptoms include sneezing, itchy eyes, and occasional nasal stuffiness. She uses Ryaltris  nasal spray as needed, which helps with her symptoms, and she has not needed it much lately. She also uses eye drops consistently, which provide relief.  She also takes Claritin as her antihistamine.  She uses saline spray but finds it insufficient to clear her sinuses effectively.  She has not yet tried to do nasal saline rinse with nose after medicated home.  She has not used her rescue inhaler since her last visit and reports no significant breathing issues.  Review of systems: 10pt ROS negative unless noted above in HPI   Past medical/social/surgical/family history have been reviewed and are unchanged unless specifically indicated below.  No changes  Medication List: Current Outpatient Medications  Medication Sig Dispense Refill   albuterol  (PROVENTIL  HFA;VENTOLIN  HFA) 108 (90 Base) MCG/ACT inhaler Inhale 1-2 puffs into the lungs every 6 (six) hours as needed for wheezing or shortness of breath. 1 Inhaler 0   amLODipine  (NORVASC ) 10 MG tablet Take  10 mg by mouth daily.     azelastine  (ASTELIN ) 0.1 % nasal spray Place 2 sprays into both nostrils 2 (two) times daily. Use in each nostril as directed 30 mL 0   bisoprolol  (ZEBETA ) 5 MG tablet Take 5 mg by mouth daily.     buPROPion (WELLBUTRIN XL) 300 MG 24 hr tablet Take 300 mg by mouth every morning.     busPIRone  (BUSPAR ) 15 MG tablet Take 1 tablet (15 mg total) by mouth 2 (two) times daily. 60 tablet 0   DULoxetine (CYMBALTA) 60 MG capsule Take 60 mg by mouth 2 (two) times daily.     EPINEPHrine  (EPIPEN  2-PAK) 0.3 mg/0.3 mL IJ SOAJ injection Inject 0.3 mg into the muscle as needed for anaphylaxis. 2 each 1   etonogestrel  (NEXPLANON ) 68 MG IMPL implant 1 each by Subdermal route once.     Multiple Vitamin (MULTIVITAMIN PO) Take 1 tablet by mouth daily at 6 (six) AM.     Olopatadine-Mometasone (RYALTRIS ) 665-25 MCG/ACT SUSP Place 2 sprays into the nose in the morning and at bedtime. 29 g 1   traZODone  (DESYREL ) 100 MG tablet Take 1 tablet by mouth at bedtime.     ondansetron  (ZOFRAN -ODT) 4 MG disintegrating tablet Take 1 tablet (4 mg total) by mouth every 8 (eight) hours as needed for nausea or vomiting. (Patient not taking: Reported on 08/08/2024) 20 tablet 0   No current facility-administered medications for this visit.     Known medication allergies: Allergies  Allergen Reactions   Moxifloxacin Swelling    other  Other Reaction(s): Not available  moxifloxacin  Clindamycin Diarrhea and Other (See Comments)    GI upset   Codeine  Other (See Comments)    TACHYCARDIA    Levofloxacin  Other (See Comments)    other   Losartan  Other (See Comments)    Dizzy and lightheaded    Metronidazole  Diarrhea   Minocin  [Minocycline  Hcl] Other (See Comments)    Stomach Pains   Penicillins Rash    Has patient had a PCN reaction causing immediate rash, facial/tongue/throat swelling, SOB or lightheadedness with hypotension:yes  Has patient had a PCN reaction causing severe rash involving mucus  membranes or skin necrosis:no  Has patient had a PCN reaction that required hospitalization:no  Has patient had a PCN reaction occurring within the last 10 years:No  If all of the above answers are NO, then may proceed with Cephalosporin  Other Reaction(s): Not available     Physical examination: Blood pressure 124/78, pulse 84, temperature 98.1 F (36.7 C), temperature source Temporal, resp. rate 18, height 5' 2 (1.575 m), weight 185 lb 1.6 oz (84 kg), SpO2 97%.  General: Alert, interactive, in no acute distress. HEENT: PERRLA, LT TM with scarring, Rt TMs pearly gray, turbinates non-edematous without discharge, post-pharynx non erythematous. Neck: Supple without lymphadenopathy. Lungs: Clear to auscultation without wheezing, rhonchi or rales. {no increased work of breathing. CV: Normal S1, S2 without murmurs. Abdomen: Nondistended, nontender. Skin: Warm and dry, without lesions or rashes. Extremities:  No clubbing, cyanosis or edema. Neuro:   Grossly intact.  Diagnostics/Labs:  Spirometry: FEV1: 2.21L 102%, FVC: 2.48L 93%, ratio consistent with nonobstructive pattern  Assessment and plan:   Allergic rhinitis with history of chronic sinusitis - Continue avoidance measures for grasses, trees, indoor molds, and outdoor molds - Continue allergy  shots per schedule and have access to epinephrine  device in case of reaction.  You are in the red (maintenance) vial!  - Educated on using saline rinse with distilled or boiled water for nasal passage cleaning.  Use saline rinse kit prior to nasal spray use.  Use distilled water or boil water and bring to room temperature (do not use tap water).   Keep mouth open entirety of rinse.   - Continue use of Ryaltris  nasal sprays 2 sprays each nostril twice a day for runny or stuffy nose control.   With using nasal sprays point tip of bottle toward eye on same side nostril and lean head slightly forward for best technique.   - Continue Claritin  10 mg daily at this time - Use Pataday 1 drop each eye daily as needed for itchy watery eyes   Mild intermittent asthma Under good control at this time with no maintenance medication needs.  - Have access to albuterol  inhaler 2 puffs every 4-6 hours as needed for cough/wheeze/shortness of breath/chest tightness.  May use 15-20 minutes prior to activity.   Monitor frequency of use.    Routine follow-up in 12 months or sooner if needed  I appreciate the opportunity to take part in Charmagne's care. Please do not hesitate to contact me with questions.  Sincerely,   Danita Brain, MD Allergy /Immunology [facility]

## 2024-08-08 NOTE — Patient Instructions (Addendum)
 Allergic rhinitis with history of chronic sinusitis - Continue avoidance measures for grasses, trees, indoor molds, and outdoor molds - Continue allergy  shots per schedule and have access to epinephrine  device in case of reaction.  You are in the red (maintenance) vial!  - Educated on using saline rinse with distilled or boiled water for nasal passage cleaning.  Use saline rinse kit prior to nasal spray use.  Use distilled water or boil water and bring to room temperature (do not use tap water).   Keep mouth open entirety of rinse.   - Continue use of Ryaltris  nasal sprays 2 sprays each nostril twice a day for runny or stuffy nose control.   With using nasal sprays point tip of bottle toward eye on same side nostril and lean head slightly forward for best technique.   - Continue Claritin 10 mg daily at this time - Use Pataday 1 drop each eye daily as needed for itchy watery eyes   Mild intermittent asthma Under good control at this time with no maintenance medication needs.  - Have access to albuterol  inhaler 2 puffs every 4-6 hours as needed for cough/wheeze/shortness of breath/chest tightness.  May use 15-20 minutes prior to activity.   Monitor frequency of use.    Routine follow-up in 12 months or sooner if needed

## 2024-08-17 ENCOUNTER — Ambulatory Visit (INDEPENDENT_AMBULATORY_CARE_PROVIDER_SITE_OTHER): Admitting: *Deleted

## 2024-08-17 DIAGNOSIS — J309 Allergic rhinitis, unspecified: Secondary | ICD-10-CM | POA: Diagnosis not present

## 2024-08-21 ENCOUNTER — Ambulatory Visit (HOSPITAL_COMMUNITY)
Admission: RE | Admit: 2024-08-21 | Discharge: 2024-08-21 | Disposition: A | Discharge status: Home / Self Care | Source: Ambulatory Visit | Attending: Thoracic Surgery (Cardiothoracic Vascular Surgery) | Admitting: Thoracic Surgery (Cardiothoracic Vascular Surgery)

## 2024-08-21 DIAGNOSIS — I7121 Aneurysm of the ascending aorta, without rupture: Secondary | ICD-10-CM | POA: Insufficient documentation

## 2024-08-21 MED ORDER — IOHEXOL 350 MG/ML SOLN
75.0000 mL | Freq: Once | INTRAVENOUS | Status: AC | PRN
  Administered 2024-08-21: 75 mL via INTRAVENOUS

## 2024-08-22 ENCOUNTER — Ambulatory Visit (HOSPITAL_COMMUNITY)

## 2024-08-27 ENCOUNTER — Ambulatory Visit (INDEPENDENT_AMBULATORY_CARE_PROVIDER_SITE_OTHER)

## 2024-08-27 DIAGNOSIS — J309 Allergic rhinitis, unspecified: Secondary | ICD-10-CM | POA: Diagnosis not present

## 2024-08-29 ENCOUNTER — Ambulatory Visit

## 2024-09-04 ENCOUNTER — Ambulatory Visit

## 2024-09-05 ENCOUNTER — Ambulatory Visit

## 2024-09-05 VITALS — BP 138/83 | HR 85 | Resp 18 | Ht 62.0 in | Wt 184.0 lb

## 2024-09-05 DIAGNOSIS — I7121 Aneurysm of the ascending aorta, without rupture: Secondary | ICD-10-CM | POA: Diagnosis not present

## 2024-09-05 NOTE — Progress Notes (Signed)
 [address] [place] [address]             [phone]            ALITZA COWMAN [id number] [birth year]   History of Present Illness:  Megan Lang is a 53 year old female with medical history hypertension, GERD, cardiomegaly, and chronic rhinosinusitis who presents today for continued follow-up of ascending thoracic aortic aneurysm.  Aneurysm has stayed stable in size measuring at 4.1 cm on recent CTA of chest.  Echocardiogram on 11/2023 showed that the aortic valve is normal in structure.  She presents today to the clinic reports that she has been doing well.  Her blood pressure is well-controlled with current medication therapy and home readings are 120s-130s/80s.  She does have some dyspnea on exertion which is chronic.  It has not worsened recently.  She has not needed to use her albuterol  inhaler.  She denies chest pain and lower leg swelling.  She does have a brother who is 19 years old and recently had an aortic dissection with repair.  Her cousin also has an ascending thoracic aortic aneurysm.  No history of connective tissue disorders.  Current Outpatient Medications on File Prior to Visit  Medication Sig Dispense Refill   albuterol  (PROVENTIL  HFA;VENTOLIN  HFA) 108 (90 Base) MCG/ACT inhaler Inhale 1-2 puffs into the lungs every 6 (six) hours as needed for wheezing or shortness of breath. 1 Inhaler 0   amLODipine  (NORVASC ) 10 MG tablet Take 10 mg by mouth daily.     azelastine  (ASTELIN ) 0.1 % nasal spray Place 2 sprays into both nostrils 2 (two) times daily. Use in each nostril as directed 30 mL 0   bisoprolol  (ZEBETA ) 5 MG tablet Take 5 mg by mouth daily.     buPROPion (WELLBUTRIN XL) 300 MG 24 hr tablet Take 300 mg by mouth every morning.     busPIRone  (BUSPAR ) 15 MG tablet Take 1 tablet (15 mg total) by mouth 2 (two) times daily. 60 tablet 0   DULoxetine (CYMBALTA) 60 MG capsule Take 60 mg by mouth 2 (two) times daily.     EPINEPHrine   (EPIPEN  2-PAK) 0.3 mg/0.3 mL IJ SOAJ injection Inject 0.3 mg into the muscle as needed for anaphylaxis. 2 each 1   etonogestrel  (NEXPLANON ) 68 MG IMPL implant 1 each by Subdermal route once.     Multiple Vitamin (MULTIVITAMIN PO) Take 1 tablet by mouth daily at 6 (six) AM.     Olopatadine-Mometasone (RYALTRIS ) 665-25 MCG/ACT SUSP Place 2 sprays into the nose in the morning and at bedtime. 29 g 1   ondansetron  (ZOFRAN -ODT) 4 MG disintegrating tablet Take 1 tablet (4 mg total) by mouth every 8 (eight) hours as needed for nausea or vomiting. 20 tablet 0   traZODone  (DESYREL ) 100 MG tablet Take 1 tablet by mouth at bedtime.     No current facility-administered medications on file prior to visit.     ROS: Review of Systems  Constitutional:  Negative for malaise/fatigue.  Respiratory:  Positive for shortness of breath. Negative for cough.        On exertion  Cardiovascular:  Negative for chest pain and leg swelling.     BP 138/83 (BP Location: Left Arm)   Pulse 85   Resp 18   Ht 5' 2 (1.575 m)   Wt 184 lb (83.5 kg)   SpO2 97%   BMI 33.65 kg/m   Physical Exam  Constitutional:      Appearance: Normal appearance.  HENT:     Head: Normocephalic and atraumatic.  Musculoskeletal:     Cervical back: Normal range of motion.  Skin:    General: Skin is warm and dry.  Neurological:     General: No focal deficit present.     Mental Status: She is alert.      Imaging: EXAM: CTA CHEST AORTA 08/21/2024 08:54:04 AM   TECHNIQUE: CTA of the chest was performed with and without the administration of intravenous contrast. 75 mL of iohexol  (OMNIPAQUE ) 350 MG/ML injection was administered. Multiplanar reformatted images are provided for review. MIP images are provided for review. Automated exposure control, iterative reconstruction, and/or weight based adjustment of the mA/kV was utilized to reduce the radiation dose to as low as reasonably achievable.   COMPARISON: Study from  03/01/2022.   CLINICAL HISTORY: Aortic aneurysm suspected.   FINDINGS:   AORTA: Unchanged appearance of ascending thoracic aortic aneurysm measuring 4.1 cm, coronal image 91/601. Mild aortic atherosclerotic calcifications. No thoracic aortic dissection.   MEDIASTINUM: Similar appearance of increased soft tissue within the anterior mediastinum which is favored to represent residual thymic tissue. The appearance is stable compared with the study from 03/01/2022. Lad coronary artery calcifications. Normal caliber of the main pulmonary artery. The heart and pericardium demonstrate no acute abnormality.   LYMPH NODES: Prominent bilateral axillary lymph nodes also appear unchanged from previous exam. No mediastinal, hilar lymphadenopathy.   LUNGS AND PLEURA: Central airways appear patent. Unchanged nodule in the inferior right middle lobe measuring 4 mm, image 74/302. No focal consolidation or pulmonary edema. No pleural effusion or pneumothorax.   UPPER ABDOMEN: Cyst within dome of right lobe of liver measures 2 cm, image 81/301.   SOFT TISSUES AND BONES: No acute bone or soft tissue abnormality.   IMPRESSION: 1. Unchanged ascending thoracic aortic aneurysm measuring 4.1 cm. 2. Aortic atherosclerosis and coronary artery calcifications. 3. Stable 4 mm right middle lobe lung nodule compatible with a benign abnormality. No follow-up imaging recommended.   Electronically signed by: Waddell Calk MD 08/21/2024 09:02 AM EST RP Workstation: [id number]     A/P: Aneurysm of ascending aorta without rupture -4.1 cm ascending thoracic aortic aneurysm on CTA of chest.  Echocardiogram showed normal aortic valve structure. -We discussed the natural history and and risk factors for growth of ascending aortic aneurysms. Discussed recommendations to minimize the risk of further expansion or dissection including careful blood pressure control, avoidance of contact sports and heavy lifting,  attention to lipid management.  We covered the importance of staying never user of tobacco.  The patient does not yet meet surgical criteria of >5.5cm. The patient is aware of signs and symptoms of aortic dissection and when to present to the emergency department   -Follow up in one year with CTA of chest for continued surveillance   Pulmonary Nodule -Stable 4 mm right middle lobe lung nodule, likely benign and no other imaging recommended -Will continue to follow with CT scan for aneurysm    Risk Modification:  Statin:  not currently prescribed  Smoking cessation instruction/counseling given:  never user  Patient was counseled on importance of Blood Pressure Control  They are instructed to contact their Primary Care Physician if they start to have blood pressure readings over 130s/90s. Do not ever stop blood pressure medications on your own, unless instructed by healthcare professional.  Please avoid use of Fluoroquinolones as this can potentially increase your risk of Aortic Rupture and/or  Dissection  Patient educated on signs and symptoms of Aortic Dissection, handout also provided in AVS  Manuelita CHRISTELLA Rough, PA-C 09/05/24

## 2024-09-05 NOTE — Patient Instructions (Signed)

## 2024-09-06 ENCOUNTER — Encounter: Payer: Self-pay | Admitting: Allergy & Immunology

## 2024-09-06 ENCOUNTER — Ambulatory Visit

## 2024-09-06 DIAGNOSIS — J309 Allergic rhinitis, unspecified: Secondary | ICD-10-CM | POA: Diagnosis not present

## 2024-09-11 ENCOUNTER — Ambulatory Visit

## 2024-09-11 DIAGNOSIS — J309 Allergic rhinitis, unspecified: Secondary | ICD-10-CM | POA: Diagnosis not present

## 2024-09-19 ENCOUNTER — Ambulatory Visit

## 2024-09-19 DIAGNOSIS — J309 Allergic rhinitis, unspecified: Secondary | ICD-10-CM | POA: Diagnosis not present

## 2024-09-26 ENCOUNTER — Ambulatory Visit (INDEPENDENT_AMBULATORY_CARE_PROVIDER_SITE_OTHER)

## 2024-09-26 DIAGNOSIS — J309 Allergic rhinitis, unspecified: Secondary | ICD-10-CM

## 2024-09-27 DIAGNOSIS — J302 Other seasonal allergic rhinitis: Secondary | ICD-10-CM | POA: Diagnosis not present

## 2024-09-27 DIAGNOSIS — J301 Allergic rhinitis due to pollen: Secondary | ICD-10-CM | POA: Diagnosis not present

## 2024-09-27 NOTE — Progress Notes (Signed)
 VIALS MADE ON 09/27/24

## 2024-10-02 ENCOUNTER — Ambulatory Visit

## 2024-10-02 DIAGNOSIS — J309 Allergic rhinitis, unspecified: Secondary | ICD-10-CM

## 2024-10-17 ENCOUNTER — Other Ambulatory Visit: Payer: Self-pay

## 2024-10-17 ENCOUNTER — Ambulatory Visit
Admission: RE | Admit: 2024-10-17 | Discharge: 2024-10-17 | Disposition: A | Discharge status: Home / Self Care | Source: Home / Self Care

## 2024-10-17 ENCOUNTER — Ambulatory Visit (HOSPITAL_COMMUNITY): Payer: Self-pay

## 2024-10-17 VITALS — BP 121/81 | HR 84 | Temp 98.2°F | Resp 17 | Ht 62.0 in | Wt 182.0 lb

## 2024-10-17 DIAGNOSIS — J9801 Acute bronchospasm: Secondary | ICD-10-CM

## 2024-10-17 DIAGNOSIS — J209 Acute bronchitis, unspecified: Secondary | ICD-10-CM

## 2024-10-17 MED ORDER — BUDESONIDE-FORMOTEROL FUMARATE 160-4.5 MCG/ACT IN AERO: 2.0000 | INHALATION_SPRAY | Freq: Two times a day (BID) | RESPIRATORY_TRACT | 0 refills | Status: AC

## 2024-10-17 NOTE — ED Provider Notes (Signed)
 [facility]    CSN: [id number] Arrival date & time: 10/17/24  9074      History   Chief Complaint Chief Complaint  Patient presents with   Cough   Nasal Congestion    HPI Megan Lang is a 53 y.o. female.   HPI  Pt is here today with concerns for coughing that has been ongoing for about 2.5 months. She reports that it is intermittently productive but she feels like there is something she can hear in the chest. She also reports some runny nose and nasal congestion. She also reports ear fullness and clogged sensation. She reports that she tested positive for the Flu two weeks ago and was treated for this with antivirals and prednisone .  She states that cough improved with the medications she took 2 weeks ago but did not go away.  She denies previous hx of asthma but admits she does have an albuterol  inhaler. She reports she is having to use her inhaler about 2 times per day but states she has not had increased frequency of use during illness Interventions: Robitussin     Past Medical History:  Diagnosis Date   Acid reflux    with certain foods/ takes OTC PRN meds   Allergy     Aortic aneurysm    Arthritis    generalized   Asthma    PRN inhaler   Cardiomegaly    Carpal tunnel syndrome    Depression    on meds   Gastric polyp    GERD (gastroesophageal reflux disease) 01/23/2016   Hiatal hernia    Hypertension    on meds   Normal spontaneous vaginal delivery    3   Prediabetes 06/2023   Recurrent upper respiratory infection (URI)     Patient Active Problem List   Diagnosis Date Noted   Family history of aortic dissection 11/03/2023   Thoracic aortic aneurysm without rupture 08/23/2023   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   Keloid of skin 10/08/2019   Cervical myelopathy (HCC) 08/31/2018   Spinal stenosis in cervical region 08/29/2018   Cervicalgia 08/29/2018   Shoulder impingement syndrome, left 12/19/2017    Idiopathic scoliosis and kyphoscoliosis 07/01/2017   Chronic bilateral back pain 07/01/2017   Nexplanon  in place 07/29/2016   Cardiomegaly 07/01/2016   History of aortic aneurysm 07/01/2016   GERD (gastroesophageal reflux disease) 01/23/2016   Dyspnea 10/27/2015   Costochondritis 08/26/2015   Obesity 07/01/2015   Upper airway cough syndrome 06/30/2015   History of shingles 01/26/2015   Family history of colon cancer 10/27/2011   Essential hypertension 10/27/2011    Past Surgical History:  Procedure Laterality Date   ANTERIOR CERVICAL DECOMPRESSION/DISCECTOMY FUSION 4 LEVELS N/A 08/31/2018   Procedure: ANTERIOR CERVICAL DECOMPRESSION/DISCECTOMY FUSION C3-7;  Surgeon: Burnetta Aures, MD;  Location: [facility] OR;  Service: Orthopedics;  Laterality: N/A;  6 hrs   CARPAL TUNNEL RELEASE Left 2009   CARPAL TUNNEL RELEASE Right 04/2022   COLONOSCOPY  2017   SA-MAC-suprep(good)-HPP/ TA x1/5 yr recall   COLONOSCOPY  06/28/2023   HAND SURGERY Left 03/2023   NASAL SINUS SURGERY     DEC 15,2016   nexplanon  insertion     inserted 07-03-2019   ROTATOR CUFF REPAIR Left 04/14/2018   WISDOM TOOTH EXTRACTION      OB History     Gravida  3   Para  3   Term  3   Preterm      AB  Living  3      SAB      IAB      Ectopic      Multiple      Live Births  3            Home Medications    Prior to Admission medications  Medication Sig Start Date End Date Taking? Authorizing Provider  budesonide-formoterol (SYMBICORT) 160-4.5 MCG/ACT inhaler Inhale 2 puffs into the lungs in the morning and at bedtime. 10/17/24  Yes Chesky Heyer E, PA-C  albuterol  (PROVENTIL  HFA;VENTOLIN  HFA) 108 (90 Base) MCG/ACT inhaler Inhale 1-2 puffs into the lungs every 6 (six) hours as needed for wheezing or shortness of breath. 09/03/16   Tonia Chew, MD  amLODipine  (NORVASC ) 10 MG tablet Take 10 mg by mouth daily. 12/07/21   [provider]  ARIPiprazole (ABILIFY) 2 MG tablet Take 2 mg by  mouth daily. 09/04/24   [provider]  azelastine  (ASTELIN ) 0.1 % nasal spray Place 2 sprays into both nostrils 2 (two) times daily. Use in each nostril as directed 11/16/17   Juliane Che, PA  bisoprolol  (ZEBETA ) 5 MG tablet Take 5 mg by mouth daily. 10/25/21   [provider]  buPROPion (WELLBUTRIN XL) 300 MG 24 hr tablet Take 300 mg by mouth every morning.    [provider]  busPIRone  (BUSPAR ) 15 MG tablet Take 1 tablet (15 mg total) by mouth 2 (two) times daily. 08/09/18   Ezzard Staci SAILOR, NP  DULoxetine (CYMBALTA) 60 MG capsule Take 60 mg by mouth 2 (two) times daily. 04/21/22   [provider]  EPINEPHrine  (EPIPEN  2-PAK) 0.3 mg/0.3 mL IJ SOAJ injection Inject 0.3 mg into the muscle as needed for anaphylaxis. 06/14/24   Iva Marty Saltness, MD  etonogestrel  (NEXPLANON ) 68 MG IMPL implant 1 each by Subdermal route once. 07/15/22   [provider]  Multiple Vitamin (MULTIVITAMIN PO) Take 1 tablet by mouth daily at 6 (six) AM.    [provider]  Olopatadine-Mometasone (RYALTRIS ) 665-25 MCG/ACT SUSP Place 2 sprays into the nose in the morning and at bedtime. 03/30/24   Jeneal Danita Macintosh, MD  ondansetron  (ZOFRAN -ODT) 4 MG disintegrating tablet Take 1 tablet (4 mg total) by mouth every 8 (eight) hours as needed for nausea or vomiting. 12/23/23   Hazen Darryle BRAVO, FNP  traZODone  (DESYREL ) 100 MG tablet Take 1 tablet by mouth at bedtime.    [provider]    Family History Family History  Problem Relation Age of Onset   Hypertension Mother    Liver cancer Father 15 - 72   Kidney disease Father    Liver disease Father        Unknown diagnosis   Hypertension Sister    Hypertension Brother    Drug abuse Brother    Breast cancer Maternal Grandmother    Colon polyps Neg Hx    Esophageal cancer Neg Hx    Stomach cancer Neg Hx    Rectal cancer Neg Hx     Social History Social History[1]   Allergies   Moxifloxacin,  Clindamycin, Codeine , Levofloxacin , Losartan , Metronidazole , Minocin  [minocycline  hcl], and Penicillins   Review of Systems Review of Systems  Constitutional:  Negative for chills and fever.  HENT:  Positive for congestion and rhinorrhea. Negative for ear pain and sore throat.   Respiratory:  Positive for cough and shortness of breath.   Gastrointestinal:  Negative for diarrhea, nausea and vomiting.  Musculoskeletal:  Negative for myalgias.  Physical Exam Triage Vital Signs ED Triage Vitals  Encounter Vitals Group     BP 10/17/24 0940 121/81     Girls Systolic BP Percentile --      Girls Diastolic BP Percentile --      Boys Systolic BP Percentile --      Boys Diastolic BP Percentile --      Pulse Rate 10/17/24 0940 84     Resp 10/17/24 0940 17     Temp 10/17/24 0940 98.2 F (36.8 C)     Temp Source 10/17/24 0940 Oral     SpO2 10/17/24 0940 98 %     Weight 10/17/24 0939 182 lb (82.6 kg)     Height 10/17/24 0939 5' 2 (1.575 m)     Head Circumference --      Peak Flow --      Pain Score 10/17/24 0939 0     Pain Loc --      Pain Education --      Exclude from Growth Chart --    No data found.  Updated Vital Signs BP 121/81 (BP Location: Right Arm)   Pulse 84   Temp 98.2 F (36.8 C) (Oral)   Resp 17   Ht 5' 2 (1.575 m)   Wt 182 lb (82.6 kg)   SpO2 98%   BMI 33.29 kg/m   Visual Acuity Right Eye Distance:   Left Eye Distance:   Bilateral Distance:    Right Eye Near:   Left Eye Near:    Bilateral Near:     Physical Exam Vitals reviewed.  Constitutional:      General: She is awake.     Appearance: Normal appearance. She is well-developed and well-groomed.  HENT:     Head: Normocephalic and atraumatic.  Cardiovascular:     Rate and Rhythm: Normal rate and regular rhythm.     Pulses: Normal pulses.          Radial pulses are 2+ on the right side and 2+ on the left side.     Heart sounds: Normal heart sounds. No murmur heard.    No friction rub. No  gallop.  Pulmonary:     Effort: Pulmonary effort is normal.     Breath sounds: Normal breath sounds. No decreased air movement. No decreased breath sounds, wheezing, rhonchi or rales.  Musculoskeletal:     Cervical back: Normal range of motion and neck supple.  Lymphadenopathy:     Head:     Right side of head: No submental, submandibular or preauricular adenopathy.     Left side of head: No submental, submandibular or preauricular adenopathy.     Cervical:     Right cervical: No superficial cervical adenopathy.    Left cervical: No superficial cervical adenopathy.     Upper Body:     Right upper body: No supraclavicular adenopathy.     Left upper body: No supraclavicular adenopathy.  Skin:    General: Skin is warm and dry.  Neurological:     General: No focal deficit present.     Mental Status: She is alert and oriented to person, place, and time.  Psychiatric:        Mood and Affect: Mood normal.        Behavior: Behavior normal. Behavior is cooperative.        Thought Content: Thought content normal.        Judgment: Judgment normal.      UC Treatments / Results  Labs (all labs ordered are listed, but only abnormal results are displayed) Labs Reviewed - No data to display  EKG   Radiology No results found.  Procedures Procedures (including critical care time)  Medications Ordered in UC Medications - No data to display  Initial Impression / Assessment and Plan / UC Course  I have reviewed the triage vital signs and the nursing notes.  Pertinent labs & imaging results that were available during my care of the patient were reviewed by me and considered in my medical decision making (see chart for details).      Final Clinical Impressions(s) / UC Diagnoses   Final diagnoses:  Cough due to bronchospasm  Acute bronchitis, unspecified organism   Patient presents today with concerns of persistent cough that is been ongoing for reported 2 months.  She reports  the cough is intermittently productive and is not responding to Robitussin but she did feel better when she was treated for influenza with Tamiflu  and prednisone  approximately 2 weeks ago.  Physical exam is largely reassuring without signs of abnormal lung sounds and vitals are also within normal limits.  At this time I suspect pulmonary inflammation such as asthma exacerbation, acute bronchitis, cough due to bronchospasm.  Since patient was just treated with steroids we will try stepping up her asthma therapy with Symbicort twice per day for at least 2 weeks.  Reviewed that she can also continue to use her albuterol  inhaler as needed for further symptomatic relief along with OTC medications.  ED and return precautions reviewed provided in AVS.  Follow-up as needed.    Discharge Instructions      You were seen today for concerns of persisting cough that is been ongoing for about 2 months.  Based on your symptoms as well as your medical history I suspect that you likely have either a cough due to irritation in the airways in your lungs or an asthma exacerbation.  To help with your symptoms I am sending you home with and inhaler called Symbicort for you to use twice per day for at least 2 weeks.  This should help calm down the inflammation that is aggravating your coughing reflex.  You can continue to use your albuterol  rescue inhaler as needed up to every 6 hours for symptoms.  If you feel like your symptoms are not improving or seem to be worsening I recommend following up with your primary care provider for further evaluation and ongoing management.  If you start to develop more severe symptoms such as difficulty breathing, fevers that are not responding to Tylenol  ibuprofen , loss of consciousness or confusion please go to the ER     ED Prescriptions     Medication Sig Dispense Auth. Provider   budesonide-formoterol (SYMBICORT) 160-4.5 MCG/ACT inhaler Inhale 2 puffs into the lungs in the morning and  at bedtime. 6 g Kyen Taite E, PA-C      PDMP not reviewed this encounter.     [1]  Social History Tobacco Use   Smoking status: Never    Passive exposure: Past   Smokeless tobacco: Never  Vaping Use   Vaping status: Never Used  Substance Use Topics   Alcohol use: No   Drug use: No     Caedence Snowden, [place] BRAVO, PA-C 10/17/24 1052  "

## 2024-10-17 NOTE — ED Triage Notes (Signed)
 Pt presents with c/o cough and nasal congestion. Cough has been present for approximately 2.5 months. Nasal congestion for two weeks. Was dx with the Flu on 12/20. Was prescribed Tamiflu  + Prednisone . Feels like these medications helped sxs. No pain. Concerned as symptoms are still present weeks later.

## 2024-10-17 NOTE — Discharge Instructions (Addendum)
 You were seen today for concerns of persisting cough that is been ongoing for about 2 months.  Based on your symptoms as well as your medical history I suspect that you likely have either a cough due to irritation in the airways in your lungs or an asthma exacerbation.  To help with your symptoms I am sending you home with and inhaler called Symbicort for you to use twice per day for at least 2 weeks.  This should help calm down the inflammation that is aggravating your coughing reflex.  You can continue to use your albuterol  rescue inhaler as needed up to every 6 hours for symptoms.  If you feel like your symptoms are not improving or seem to be worsening I recommend following up with your primary care provider for further evaluation and ongoing management.  If you start to develop more severe symptoms such as difficulty breathing, fevers that are not responding to Tylenol  ibuprofen , loss of consciousness or confusion please go to the ER

## 2024-10-19 ENCOUNTER — Ambulatory Visit: Admitting: *Deleted

## 2024-10-19 DIAGNOSIS — J309 Allergic rhinitis, unspecified: Secondary | ICD-10-CM

## 2024-11-02 ENCOUNTER — Ambulatory Visit (INDEPENDENT_AMBULATORY_CARE_PROVIDER_SITE_OTHER)

## 2024-11-02 DIAGNOSIS — J302 Other seasonal allergic rhinitis: Secondary | ICD-10-CM | POA: Diagnosis not present

## 2024-11-08 ENCOUNTER — Ambulatory Visit

## 2024-11-08 DIAGNOSIS — J302 Other seasonal allergic rhinitis: Secondary | ICD-10-CM | POA: Diagnosis not present

## 2024-11-15 ENCOUNTER — Ambulatory Visit

## 2024-11-15 DIAGNOSIS — J302 Other seasonal allergic rhinitis: Secondary | ICD-10-CM | POA: Diagnosis not present

## 2024-11-21 ENCOUNTER — Ambulatory Visit: Admitting: Internal Medicine

## 2024-12-13 ENCOUNTER — Ambulatory Visit: Admitting: Internal Medicine

## 2025-01-02 ENCOUNTER — Ambulatory Visit: Admitting: Allergy

## 2025-01-21 ENCOUNTER — Ambulatory Visit: Admitting: Nurse Practitioner

## 2025-01-28 ENCOUNTER — Ambulatory Visit: Admitting: Nurse Practitioner

## 2025-01-29 ENCOUNTER — Ambulatory Visit: Admitting: Nurse Practitioner
